# Patient Record
Sex: Female | Born: 1945
Health system: Southern US, Community
[De-identification: ages and names within clinical notes are randomized; demographics above are authoritative.]

## PROBLEM LIST (undated history)

## (undated) DIAGNOSIS — Z973 Presence of spectacles and contact lenses: Secondary | ICD-10-CM

## (undated) DIAGNOSIS — R112 Nausea with vomiting, unspecified: Secondary | ICD-10-CM

## (undated) DIAGNOSIS — Z8249 Family history of ischemic heart disease and other diseases of the circulatory system: Secondary | ICD-10-CM

## (undated) DIAGNOSIS — Z87442 Personal history of urinary calculi: Secondary | ICD-10-CM

## (undated) DIAGNOSIS — F419 Anxiety disorder, unspecified: Secondary | ICD-10-CM

## (undated) DIAGNOSIS — M47812 Spondylosis without myelopathy or radiculopathy, cervical region: Secondary | ICD-10-CM

## (undated) DIAGNOSIS — J189 Pneumonia, unspecified organism: Secondary | ICD-10-CM

## (undated) DIAGNOSIS — I4891 Unspecified atrial fibrillation: Secondary | ICD-10-CM

## (undated) DIAGNOSIS — Z9889 Other specified postprocedural states: Secondary | ICD-10-CM

## (undated) DIAGNOSIS — K219 Gastro-esophageal reflux disease without esophagitis: Secondary | ICD-10-CM

## (undated) DIAGNOSIS — I499 Cardiac arrhythmia, unspecified: Secondary | ICD-10-CM

## (undated) DIAGNOSIS — E785 Hyperlipidemia, unspecified: Secondary | ICD-10-CM

## (undated) DIAGNOSIS — I10 Essential (primary) hypertension: Secondary | ICD-10-CM

## (undated) DIAGNOSIS — T7840XA Allergy, unspecified, initial encounter: Secondary | ICD-10-CM

## (undated) DIAGNOSIS — T4145XA Adverse effect of unspecified anesthetic, initial encounter: Secondary | ICD-10-CM

## (undated) DIAGNOSIS — R6884 Jaw pain: Secondary | ICD-10-CM

## (undated) DIAGNOSIS — F329 Major depressive disorder, single episode, unspecified: Secondary | ICD-10-CM

## (undated) DIAGNOSIS — K635 Polyp of colon: Secondary | ICD-10-CM

## (undated) DIAGNOSIS — R739 Hyperglycemia, unspecified: Secondary | ICD-10-CM

## (undated) DIAGNOSIS — F32A Depression, unspecified: Secondary | ICD-10-CM

## (undated) HISTORY — DX: Unspecified atrial fibrillation: I48.91

## (undated) HISTORY — DX: Allergy, unspecified, initial encounter: T78.40XA

## (undated) HISTORY — PX: GANGLION CYST EXCISION: SHX1691

## (undated) HISTORY — DX: Depression, unspecified: F32.A

## (undated) HISTORY — DX: Hyperlipidemia, unspecified: E78.5

## (undated) HISTORY — DX: Gastro-esophageal reflux disease without esophagitis: K21.9

## (undated) HISTORY — DX: Anxiety disorder, unspecified: F41.9

## (undated) HISTORY — DX: Polyp of colon: K63.5

## (undated) HISTORY — DX: Hyperglycemia, unspecified: R73.9

## (undated) HISTORY — DX: Family history of ischemic heart disease and other diseases of the circulatory system: Z82.49

## (undated) HISTORY — DX: Spondylosis without myelopathy or radiculopathy, cervical region: M47.812

## (undated) HISTORY — DX: Major depressive disorder, single episode, unspecified: F32.9

## (undated) HISTORY — PX: EYE SURGERY: SHX253

---

## 1974-11-30 HISTORY — PX: LAPAROSCOPY: SHX197

## 1975-12-01 HISTORY — PX: TUBAL LIGATION: SHX77

## 1989-11-30 HISTORY — PX: ABDOMINAL HYSTERECTOMY: SHX81

## 2000-08-23 ENCOUNTER — Other Ambulatory Visit: Admission: RE | Admit: 2000-08-23 | Discharge: 2000-08-23 | Payer: Self-pay | Admitting: Gynecology

## 2003-08-13 ENCOUNTER — Other Ambulatory Visit: Admission: RE | Admit: 2003-08-13 | Discharge: 2003-08-13 | Payer: Self-pay | Admitting: Gynecology

## 2006-06-23 ENCOUNTER — Ambulatory Visit: Payer: Self-pay

## 2006-07-18 ENCOUNTER — Ambulatory Visit (HOSPITAL_COMMUNITY): Admission: RE | Admit: 2006-07-18 | Discharge: 2006-07-18 | Payer: Self-pay | Admitting: *Deleted

## 2009-11-30 DIAGNOSIS — T8859XA Other complications of anesthesia, initial encounter: Secondary | ICD-10-CM | POA: Insufficient documentation

## 2009-11-30 HISTORY — PX: COLONOSCOPY: SHX174

## 2009-11-30 HISTORY — DX: Other complications of anesthesia, initial encounter: T88.59XA

## 2010-05-02 ENCOUNTER — Encounter: Admission: RE | Admit: 2010-05-02 | Discharge: 2010-05-02 | Payer: Self-pay | Admitting: Emergency Medicine

## 2010-06-09 ENCOUNTER — Encounter: Admission: RE | Admit: 2010-06-09 | Discharge: 2010-06-09 | Payer: Self-pay | Admitting: Gastroenterology

## 2012-01-26 ENCOUNTER — Other Ambulatory Visit: Payer: Self-pay

## 2012-01-26 MED ORDER — PRAVASTATIN SODIUM 40 MG PO TABS: 40.0000 mg | ORAL_TABLET | Freq: Every day | ORAL | Status: DC

## 2012-03-29 ENCOUNTER — Other Ambulatory Visit: Payer: Self-pay | Admitting: Physician Assistant

## 2012-05-03 ENCOUNTER — Ambulatory Visit: Payer: Self-pay | Admitting: Physician Assistant

## 2012-05-17 ENCOUNTER — Encounter: Payer: Self-pay | Admitting: Physician Assistant

## 2012-05-17 ENCOUNTER — Ambulatory Visit: Payer: BC Managed Care – PPO | Admitting: Physician Assistant

## 2012-05-17 VITALS — BP 119/68 | HR 64 | Temp 98.4°F | Resp 16 | Ht 64.5 in | Wt 168.0 lb

## 2012-05-17 DIAGNOSIS — Z79890 Hormone replacement therapy (postmenopausal): Secondary | ICD-10-CM

## 2012-05-17 DIAGNOSIS — R7989 Other specified abnormal findings of blood chemistry: Secondary | ICD-10-CM

## 2012-05-17 DIAGNOSIS — J309 Allergic rhinitis, unspecified: Secondary | ICD-10-CM

## 2012-05-17 DIAGNOSIS — E782 Mixed hyperlipidemia: Secondary | ICD-10-CM

## 2012-05-17 LAB — COMPREHENSIVE METABOLIC PANEL
ALT: 17 U/L (ref 0–35)
AST: 18 U/L (ref 0–37)
Albumin: 4.1 g/dL (ref 3.5–5.2)
Alkaline Phosphatase: 87 U/L (ref 39–117)
BUN: 16 mg/dL (ref 6–23)
CO2: 25 mEq/L (ref 19–32)
Calcium: 9.3 mg/dL (ref 8.4–10.5)
Chloride: 105 mEq/L (ref 96–112)
Creat: 0.76 mg/dL (ref 0.50–1.10)
Glucose, Bld: 114 mg/dL — ABNORMAL HIGH (ref 70–99)
Potassium: 4.6 mEq/L (ref 3.5–5.3)
Sodium: 139 mEq/L (ref 135–145)
Total Bilirubin: 0.3 mg/dL (ref 0.3–1.2)
Total Protein: 6.6 g/dL (ref 6.0–8.3)

## 2012-05-17 LAB — LIPID PANEL
Cholesterol: 194 mg/dL (ref 0–200)
HDL: 48 mg/dL (ref >39)
LDL Cholesterol: 113 mg/dL — ABNORMAL HIGH (ref 0–99)
Total CHOL/HDL Ratio: 4 Ratio
Triglycerides: 164 mg/dL — ABNORMAL HIGH (ref <150)
VLDL: 33 mg/dL (ref 0–40)

## 2012-05-17 LAB — POCT GLYCOSYLATED HEMOGLOBIN (HGB A1C): Hemoglobin A1C: 5.8

## 2012-05-17 MED ORDER — FLUTICASONE PROPIONATE 50 MCG/ACT NA SUSP: 2.0000 | Freq: Every day | NASAL | Status: DC

## 2012-05-17 NOTE — Progress Notes (Addendum)
  Subjective:    Patient ID: Daisy Nelson, female    DOB: [birth year], 67 y.o.   MRN: [id number]  HPI  Presents for re-evaluation of hyperglycemia and hyperlipidemia.  Feels good.  Has had good control of allergies this spring and hasn't had an episode of sinusitis.  Review of Systems No chest pain, SOB, HA, dizziness, vision change, N/V, diarrhea, constipation, dysuria, urinary urgency or frequency, myalgias, arthralgias or rash.     Objective:   Physical Exam Vital signs noted. Well-developed, well nourished WF who is awake, alert and oriented, in NAD. HEENT: [place]/AT, PERRL, EOMI.  Sclera and conjunctiva are clear.  EAC are patent, TMs are normal in appearance. Nasal mucosa is pink and moist. OP is clear. Neck: supple, non-tender, no lymphadenopathy, thyromegaly. Heart: RRR, no murmur Lungs: CTA Extremities: no cyanosis, clubbing or edema. Skin: warm and dry without rash.  Results for orders placed in visit on 05/17/12  POCT GLYCOSYLATED HEMOGLOBIN (HGB A1C)      Component Value Range   Hemoglobin A1C 5.8           Assessment & Plan:   1. Mixed hyperlipidemia  Lipid Panel, Comprehensive metabolic panel  2. Other abnormal blood chemistry  HgB A1c, Comprehensive metabolic panel  3. AR (allergic rhinitis)  fluticasone (FLONASE) 50 MCG/ACT nasal spray  4. Postmenopausal HRT (hormone replacement therapy)    Schedule follow-up in 6 months and perform breast exam and pap at that time.  Patient Instructions  Keep up the great work!  Don't lose momentum this summer.  You may begin to wean yourself off the Premarin at your leisure, perhaps as the weather cools this fall.

## 2012-05-17 NOTE — Patient Instructions (Addendum)
Keep up the great work!  Don't lose momentum this summer.  You may begin to wean yourself off the Premarin at your leisure, perhaps as the weather cools this fall.

## 2012-05-18 ENCOUNTER — Encounter: Payer: Self-pay | Admitting: Physician Assistant

## 2012-05-19 ENCOUNTER — Telehealth: Payer: Self-pay

## 2012-05-19 NOTE — Telephone Encounter (Signed)
Pt's pharmacy called and stated that they had tried to get pt's rx refilled but that we had not responded. She needs a refill of pravastitian 40 #[phone] Pharmacy is [facility] PH# [phone]

## 2012-05-20 MED ORDER — PRAVASTATIN SODIUM 40 MG PO TABS: 40.0000 mg | ORAL_TABLET | Freq: Every day | ORAL | Status: DC

## 2012-05-20 NOTE — Telephone Encounter (Signed)
Done and sent in 

## 2012-05-20 NOTE — Telephone Encounter (Signed)
Can we do this ?

## 2012-05-21 ENCOUNTER — Other Ambulatory Visit: Payer: Self-pay

## 2012-05-21 NOTE — Telephone Encounter (Signed)
[facility] RX sent in. 

## 2012-05-30 ENCOUNTER — Other Ambulatory Visit: Payer: Self-pay | Admitting: Internal Medicine

## 2012-08-09 ENCOUNTER — Other Ambulatory Visit: Payer: Self-pay | Admitting: Physician Assistant

## 2012-09-12 ENCOUNTER — Other Ambulatory Visit: Payer: Self-pay | Admitting: Physician Assistant

## 2012-10-12 ENCOUNTER — Other Ambulatory Visit: Payer: Self-pay | Admitting: Physician Assistant

## 2012-10-15 ENCOUNTER — Telehealth: Payer: Self-pay

## 2012-10-15 MED ORDER — PRAVASTATIN SODIUM 40 MG PO TABS: 40.0000 mg | ORAL_TABLET | Freq: Every day | ORAL | Status: DC

## 2012-10-15 NOTE — Telephone Encounter (Signed)
Rx'ed refilled - keep appointment with Chelle.

## 2012-10-15 NOTE — Telephone Encounter (Signed)
PATIENT STATES WE REFILLED HER PROVASTATIN 40MG , HOWEVER, WE ONLY GAVE HER 15 PILLS. HER APPOINTMENT WITH CHELLE JEFFERY IS NOT UNTIL DEC. 17TH. SHE NEEDS TO GET ENOUGH TO LAST HER UNTIL THEN CALLED INTO HER PHARMACY. BEST PHONE [phone] (CELL)  PHARMACY CHOICE IS [facility].   MBC

## 2012-10-15 NOTE — Telephone Encounter (Signed)
[facility] Rx sent, keep appt with Chelle.

## 2012-10-15 NOTE — Telephone Encounter (Signed)
Can we do  

## 2012-10-25 ENCOUNTER — Ambulatory Visit: Payer: Self-pay | Admitting: Physician Assistant

## 2012-10-25 VITALS — BP 128/70 | HR 88 | Temp 98.2°F | Resp 16 | Ht 65.0 in | Wt 167.8 lb

## 2012-10-25 DIAGNOSIS — J309 Allergic rhinitis, unspecified: Secondary | ICD-10-CM | POA: Insufficient documentation

## 2012-10-25 DIAGNOSIS — J019 Acute sinusitis, unspecified: Secondary | ICD-10-CM

## 2012-10-25 DIAGNOSIS — J029 Acute pharyngitis, unspecified: Secondary | ICD-10-CM

## 2012-10-25 DIAGNOSIS — F419 Anxiety disorder, unspecified: Secondary | ICD-10-CM | POA: Insufficient documentation

## 2012-10-25 DIAGNOSIS — N200 Calculus of kidney: Secondary | ICD-10-CM | POA: Insufficient documentation

## 2012-10-25 DIAGNOSIS — J329 Chronic sinusitis, unspecified: Secondary | ICD-10-CM

## 2012-10-25 DIAGNOSIS — K219 Gastro-esophageal reflux disease without esophagitis: Secondary | ICD-10-CM | POA: Insufficient documentation

## 2012-10-25 DIAGNOSIS — E1169 Type 2 diabetes mellitus with other specified complication: Secondary | ICD-10-CM | POA: Insufficient documentation

## 2012-10-25 DIAGNOSIS — R05 Cough: Secondary | ICD-10-CM

## 2012-10-25 DIAGNOSIS — M47812 Spondylosis without myelopathy or radiculopathy, cervical region: Secondary | ICD-10-CM | POA: Insufficient documentation

## 2012-10-25 DIAGNOSIS — K635 Polyp of colon: Secondary | ICD-10-CM | POA: Insufficient documentation

## 2012-10-25 DIAGNOSIS — E785 Hyperlipidemia, unspecified: Secondary | ICD-10-CM | POA: Insufficient documentation

## 2012-10-25 DIAGNOSIS — R059 Cough, unspecified: Secondary | ICD-10-CM

## 2012-10-25 MED ORDER — GUAIFENESIN ER 1200 MG PO TB12: 1.0000 | ORAL_TABLET | Freq: Two times a day (BID) | ORAL | Status: DC | PRN

## 2012-10-25 MED ORDER — IPRATROPIUM BROMIDE 0.03 % NA SOLN: 2.0000 | Freq: Two times a day (BID) | NASAL | Status: DC

## 2012-10-25 MED ORDER — HYDROCOD POLST-CHLORPHEN POLST 10-8 MG/5ML PO LQCR: 5.0000 mL | Freq: Two times a day (BID) | ORAL | Status: DC | PRN

## 2012-10-25 MED ORDER — CLARITHROMYCIN ER 500 MG PO TB24: 1000.0000 mg | ORAL_TABLET | Freq: Every day | ORAL | Status: DC

## 2012-10-25 NOTE — Progress Notes (Signed)
Subjective:    Patient ID: CAMIA SCHAEDLER, female    DOB: Oct 02, 1946, 66 y.o.   MRN: [id number]  HPI This 66 y.o. female presents for evaluation of a respiratory illness.  She developed a raw sore throat after she "choked" on toothpaste 10/21/2012.  Since then, she has developed facial pressure and RIGHT maxillary sinus pain, nasal congestion and drainage, non-productive cough that keeps her from sleeping well, and laryngitis.  She has subjective fever and chills.  No nausea, vomiting or diarrhea.  No myalgias, but feels bad in general, similar to previous sinusitis she's had previously.  She was pleased to have NOT had an infection this past spring (she usually gets one twice annually) After we began a steroid nasal spray daily.  Past Medical History  Diagnosis Date  . GERD (gastroesophageal reflux disease)   . Allergy   . Hyperlipidemia   . Depression   . Anxiety   . Mitral valve prolapse   . DJD (degenerative joint disease) of cervical spine   . Nephrolithiasis   . Hyperglycemia   . Colon polyps     Past Surgical History  Procedure Date  . Tubal ligation 1977  . Cesarean section 1976  . Abdominal hysterectomy 1991  . Laparoscopy 1976  . Ganglion cyst excision     Left hand    Prior to Admission medications   Medication Sig Start Date End Date Taking? Authorizing Provider  aspirin 81 MG tablet Take 81 mg by mouth daily.   Yes Historical Provider, MD  estrogens, conjugated, (PREMARIN) 0.3 MG tablet Take 0.3 mg by mouth daily. Take daily for 21 days then do not take for 7 days.   Yes Historical Provider, MD  fish oil-omega-3 fatty acids 1000 MG capsule Take 2 g by mouth daily.   Yes Historical Provider, MD  fluticasone (FLONASE) 50 MCG/ACT nasal spray Place 2 sprays into the nose daily. 05/17/12 05/17/13 Yes Brenner Visconti S Lyrick Lagrand, PA-C  pravastatin (PRAVACHOL) 40 MG tablet Take 1 tablet (40 mg total) by mouth daily. Needs office visit and labs, third notice. 10/15/12  Yes Heather M  Marte, PA-C  ranitidine (ZANTAC) 150 MG tablet Take 150 mg by mouth 2 (two) times daily.   Yes Historical Provider, MD  sertraline (ZOLOFT) 50 MG tablet TAKE 1 TABLET BY MOUTH ONCE DAILY 05/30/12  Yes Sondra Barges, PA-C    Allergies  Allergen Reactions  . Adhesive (Tape)     Burn skin  . Erythromycin     Gi intolerance  . Naproxen     abd pain, n/v  . Penicillins     hives    History   Social History  . Marital Status: Married    Spouse Name: Molly Maduro    Number of Children: 1  . Years of Education: 12   Occupational History  . insurance agent    Social History Main Topics  . Smoking status: Former Games developer  . Smokeless tobacco: Not on file  . Alcohol Use: No  . Drug Use: No  . Sexually Active: Yes   Other Topics Concern  . Not on file   Social History Narrative   Lives with her husband and their cat, Tedra [place].    Family History  Problem Relation Age of Onset  . Stroke Mother   . Diabetes Father   . Heart disease Father   . Arthritis Father     c6-7  . Cancer Father     skin cancer (SCC)  Review of Systems As above.    Objective:   Physical Exam  Blood pressure 128/70, pulse 88, temperature 98.2 F (36.8 C), temperature source Oral, resp. rate 16, height 5\' 5"  (1.651 m), weight 167 lb 12.8 oz (76.114 kg), SpO2 96.00%. Body mass index is 27.92 kg/(m^2). Well-developed, well nourished WF who is awake, alert and oriented, in NAD. HEENT: [place]/AT, PERRL, EOMI.  Sclera and conjunctiva are clear.  EAC are patent, TMs are normal in appearance. Nasal mucosa is pink and moist. OP is clear. No frontal or maxillary sinus tenderness. Neck: supple, non-tender, no lymphadenopathy, thyromegaly. Heart: RRR, no murmur Lungs: normal effort, CTA Extremities: no cyanosis, clubbing or edema. Skin: warm and dry without rash. Psychologic: good mood and appropriate affect, normal speech and behavior.     Assessment & Plan:   1. Sinusitis  clarithromycin (BIAXIN XL) 500 MG 24  hr tablet, ipratropium (ATROVENT) 0.03 % nasal spray, Guaifenesin (MUCINEX MAXIMUM STRENGTH) 1200 MG TB12  2. Cough  chlorpheniramine-HYDROcodone (TUSSIONEX PENNKINETIC ER) 10-8 MG/5ML LQCR  3. Pharyngitis     Supportive care.  Anticipatory guidance.  RTC PRN.

## 2012-10-25 NOTE — Patient Instructions (Signed)
Get plenty of rest and drink at least 64 ounces of water daily. 

## 2012-11-15 ENCOUNTER — Encounter: Payer: Self-pay | Admitting: Physician Assistant

## 2012-11-15 ENCOUNTER — Ambulatory Visit: Payer: Self-pay | Admitting: Physician Assistant

## 2012-11-15 VITALS — BP 115/64 | HR 64 | Temp 97.1°F | Resp 16 | Ht 65.0 in | Wt 166.0 lb

## 2012-11-15 DIAGNOSIS — Z79890 Hormone replacement therapy (postmenopausal): Secondary | ICD-10-CM

## 2012-11-15 DIAGNOSIS — F329 Major depressive disorder, single episode, unspecified: Secondary | ICD-10-CM

## 2012-11-15 DIAGNOSIS — Z13820 Encounter for screening for osteoporosis: Secondary | ICD-10-CM

## 2012-11-15 DIAGNOSIS — Z1211 Encounter for screening for malignant neoplasm of colon: Secondary | ICD-10-CM

## 2012-11-15 DIAGNOSIS — F32A Depression, unspecified: Secondary | ICD-10-CM

## 2012-11-15 DIAGNOSIS — R739 Hyperglycemia, unspecified: Secondary | ICD-10-CM

## 2012-11-15 DIAGNOSIS — E785 Hyperlipidemia, unspecified: Secondary | ICD-10-CM

## 2012-11-15 DIAGNOSIS — F419 Anxiety disorder, unspecified: Secondary | ICD-10-CM

## 2012-11-15 DIAGNOSIS — Z0000 Encounter for general adult medical examination without abnormal findings: Secondary | ICD-10-CM

## 2012-11-15 DIAGNOSIS — Z1239 Encounter for other screening for malignant neoplasm of breast: Secondary | ICD-10-CM

## 2012-11-15 DIAGNOSIS — Z23 Encounter for immunization: Secondary | ICD-10-CM

## 2012-11-15 LAB — POCT URINALYSIS DIPSTICK
Bilirubin, UA: NEGATIVE
Blood, UA: NEGATIVE
Glucose, UA: NEGATIVE
Ketones, UA: NEGATIVE
Leukocytes, UA: NEGATIVE
Nitrite, UA: NEGATIVE
Protein, UA: NEGATIVE
Spec Grav, UA: 1.025
Urobilinogen, UA: 0.2
pH, UA: 5.5

## 2012-11-15 LAB — POCT GLYCOSYLATED HEMOGLOBIN (HGB A1C): Hemoglobin A1C: 6.2

## 2012-11-15 LAB — CBC WITH DIFFERENTIAL/PLATELET
Basophils Absolute: 0 10*3/uL (ref 0.0–0.1)
Basophils Relative: 0 % (ref 0–1)
Eosinophils Absolute: 0.1 10*3/uL (ref 0.0–0.7)
Eosinophils Relative: 2 % (ref 0–5)
HCT: 40.8 % (ref 36.0–46.0)
Hemoglobin: 14.3 g/dL (ref 12.0–15.0)
Lymphocytes Relative: 22 % (ref 12–46)
Lymphs Abs: 1.1 10*3/uL (ref 0.7–4.0)
MCH: 30 pg (ref 26.0–34.0)
MCHC: 35 g/dL (ref 30.0–36.0)
MCV: 85.7 fL (ref 78.0–100.0)
Monocytes Absolute: 0.6 10*3/uL (ref 0.1–1.0)
Monocytes Relative: 13 % — ABNORMAL HIGH (ref 3–12)
Neutro Abs: 3.2 10*3/uL (ref 1.7–7.7)
Neutrophils Relative %: 63 % (ref 43–77)
Platelets: 220 10*3/uL (ref 150–400)
RBC: 4.76 MIL/uL (ref 3.87–5.11)
RDW: 14.1 % (ref 11.5–15.5)
WBC: 5 10*3/uL (ref 4.0–10.5)

## 2012-11-15 LAB — POCT UA - MICROSCOPIC ONLY
Bacteria, U Microscopic: NEGATIVE
Casts, Ur, LPF, POC: NEGATIVE
Crystals, Ur, HPF, POC: NEGATIVE
RBC, urine, microscopic: NEGATIVE
WBC, Ur, HPF, POC: NEGATIVE
Yeast, UA: NEGATIVE

## 2012-11-15 LAB — LIPID PANEL
Cholesterol: 174 mg/dL (ref 0–200)
HDL: 56 mg/dL (ref >39)
LDL Cholesterol: 87 mg/dL (ref 0–99)
Total CHOL/HDL Ratio: 3.1 Ratio
Triglycerides: 155 mg/dL — ABNORMAL HIGH (ref <150)
VLDL: 31 mg/dL (ref 0–40)

## 2012-11-15 LAB — COMPREHENSIVE METABOLIC PANEL
ALT: 20 U/L (ref 0–35)
AST: 17 U/L (ref 0–37)
Albumin: 4.2 g/dL (ref 3.5–5.2)
Alkaline Phosphatase: 96 U/L (ref 39–117)
BUN: 15 mg/dL (ref 6–23)
CO2: 26 mEq/L (ref 19–32)
Calcium: 9.4 mg/dL (ref 8.4–10.5)
Chloride: 105 mEq/L (ref 96–112)
Creat: 0.81 mg/dL (ref 0.50–1.10)
Glucose, Bld: 108 mg/dL — ABNORMAL HIGH (ref 70–99)
Potassium: 4.3 mEq/L (ref 3.5–5.3)
Sodium: 139 mEq/L (ref 135–145)
Total Bilirubin: 0.4 mg/dL (ref 0.3–1.2)
Total Protein: 6.8 g/dL (ref 6.0–8.3)

## 2012-11-15 LAB — GLUCOSE, POCT (MANUAL RESULT ENTRY): POC Glucose: 110 mg/dl — AB (ref 70–99)

## 2012-11-15 LAB — IFOBT (OCCULT BLOOD): IFOBT: NEGATIVE

## 2012-11-15 MED ORDER — PRAVASTATIN SODIUM 40 MG PO TABS: 40.0000 mg | ORAL_TABLET | Freq: Every day | ORAL | Status: DC

## 2012-11-15 MED ORDER — ESTROGENS CONJUGATED 0.3 MG PO TABS: 0.3000 mg | ORAL_TABLET | Freq: Every day | ORAL | Status: DC

## 2012-11-15 MED ORDER — SERTRALINE HCL 25 MG PO TABS: 25.0000 mg | ORAL_TABLET | Freq: Every day | ORAL | Status: DC

## 2012-11-15 NOTE — Patient Instructions (Addendum)

## 2012-11-15 NOTE — Progress Notes (Signed)
Subjective:    Patient ID: Daisy Nelson, female    DOB: February 23, 1946, 66 y.o.   MRN: [id number]  HPI This 66 y.o. female presents for Annual Wellness Examination.  Reports she's doing well.  Lost her job this summer, but was able to find another, PT.  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  She's working on weaning herself from sertraline and premarin.  She's recovering from a recent respiratory illness and notes the development of urine leakage with cough, sneezing, etc.  Review of Systems  HENT: Positive for sneezing, postnasal drip and tinnitus.   Genitourinary:       Stress Incontinence; HRT  Psychiatric/Behavioral: The patient is nervous/anxious.   All other systems reviewed and are negative.      Objective:   Physical Exam  Vitals reviewed. Constitutional: She is oriented to person, place, and time. Vital signs are normal. She appears well-developed and well-nourished. She is active and cooperative. No distress.  HENT:  Head: Normocephalic and atraumatic.  Right Ear: Hearing, tympanic membrane, external ear and ear canal normal. No foreign bodies.  Left Ear: Hearing, tympanic membrane, external ear and ear canal normal. No foreign bodies.  Nose: Nose normal.  Mouth/Throat: Uvula is midline, oropharynx is clear and moist and mucous membranes are normal. No oral lesions. Normal dentition. No dental abscesses or uvula swelling. No oropharyngeal exudate.  Eyes: Conjunctivae normal, EOM and lids are normal. Pupils are equal, round, and reactive to light. Right eye exhibits no discharge. Left eye exhibits no discharge. No scleral icterus.  Fundoscopic exam:      The right eye shows no arteriolar narrowing, no AV nicking, no exudate, no hemorrhage and no papilledema. The right eye shows red reflex.The right eye shows no venous pulsations.      The left eye shows no arteriolar  narrowing, no AV nicking, no exudate, no hemorrhage and no papilledema. The left eye shows red reflex.The left eye shows no venous pulsations. Neck: Trachea normal, normal range of motion and full passive range of motion without pain. Neck supple. No spinous process tenderness and no muscular tenderness present. No mass and no thyromegaly present.  Cardiovascular: Normal rate, regular rhythm, normal heart sounds, intact distal pulses and normal pulses.   Pulmonary/Chest: Effort normal and breath sounds normal. She exhibits no tenderness and no retraction. Right breast exhibits no inverted nipple, no mass, no nipple discharge, no skin change and no tenderness. Left breast exhibits no inverted nipple, no mass, no nipple discharge, no skin change and no tenderness. Breasts are symmetrical.  Abdominal: Soft. Normal appearance and bowel sounds are normal. She exhibits no distension and no mass. There is no hepatosplenomegaly. There is no tenderness. There is no rigidity, no rebound, no guarding, no CVA tenderness, no tenderness at McBurney's point and negative Murphy's sign. No hernia. Hernia confirmed negative in the right inguinal area and confirmed negative in the left inguinal area.  Genitourinary: Rectum normal and uterus normal. Rectal exam shows no external hemorrhoid and no fissure. No breast swelling, tenderness, discharge or bleeding. Pelvic exam was performed with patient supine. No labial fusion. There is no rash, tenderness, lesion or injury on the right labia. There is no rash, tenderness, lesion or injury on the left labia. Cervix exhibits no motion tenderness. Right adnexum displays no mass, no tenderness and no fullness. Left adnexum displays no mass, no tenderness and no fullness.  No foreign body around the vagina. No vaginal discharge found.  Musculoskeletal: She exhibits no edema and no tenderness.       Cervical back: Normal.       Thoracic back: Normal.       Lumbar back: Normal.   Lymphadenopathy:       Head (right side): No tonsillar, no preauricular, no posterior auricular and no occipital adenopathy present.       Head (left side): No tonsillar, no preauricular, no posterior auricular and no occipital adenopathy present.    She has no cervical adenopathy.    She has no axillary adenopathy.       Right: No inguinal and no supraclavicular adenopathy present.       Left: No inguinal and no supraclavicular adenopathy present.  Neurological: She is alert and oriented to person, place, and time. She has normal strength and normal reflexes. No cranial nerve deficit. She exhibits normal muscle tone. Coordination and gait normal.  Skin: Skin is warm, dry and intact. No rash noted. She is not diaphoretic. No cyanosis or erythema. Nails show no clubbing.  Psychiatric: She has a normal mood and affect. Her speech is normal and behavior is normal. Judgment and thought content normal.   Results for orders placed in visit on 11/15/12  GLUCOSE, POCT (MANUAL RESULT ENTRY)      Component Value Range   POC Glucose 110 (*) 70 - 99 mg/dl  POCT GLYCOSYLATED HEMOGLOBIN (HGB A1C)      Component Value Range   Hemoglobin A1C 6.2    POCT UA - MICROSCOPIC ONLY      Component Value Range   WBC, Ur, HPF, POC neg     RBC, urine, microscopic neg     Bacteria, U Microscopic neg     Mucus, UA trace     Epithelial cells, urine per micros 0-6     Crystals, Ur, HPF, POC neg     Casts, Ur, LPF, POC neg     Yeast, UA neg    POCT URINALYSIS DIPSTICK      Component Value Range   Color, UA yellow     Clarity, UA clear     Glucose, UA neg     Bilirubin, UA neg     Ketones, UA neg     Spec Grav, UA 1.025     Blood, UA neg     pH, UA 5.5     Protein, UA neg     Urobilinogen, UA 0.2     Nitrite, UA neg     Leukocytes, UA Negative    IFOBT (OCCULT BLOOD)      Component Value Range   IFOBT Negative        Assessment & Plan:   1. Routine general medical examination at a health care  facility  CBC with Differential, POCT UA - Microscopic Only, POCT urinalysis dipstick  2. Need for prophylactic vaccination and inoculation against influenza  Flu vaccine greater than or equal to 3yo preservative free IM  3. Hyperlipidemia -controlled pravastatin (PRAVACHOL) 40 MG tablet, Comprehensive metabolic panel, Lipid panel  4. Hyperglycemia -controlled with lifestyle POCT glucose (manual entry), POCT glycosylated hemoglobin (Hb A1C)  5. Postmenopausal HRT (hormone replacement therapy) -working on tapering off estrogens, conjugated, (PREMARIN) 0.3 MG tablet  6. Depression -controlled with treatment sertraline (ZOLOFT) 25 MG tablet  7. Anxiety -controlled with treatment sertraline (ZOLOFT) 25 MG tablet  8. Screening for breast cancer  MM Digital Screening  9. Screening for  osteoporosis  DG Bone Density  10. Screening for colon cancer  IFOBT POC (occult bld, rslt in office)

## 2013-01-28 HISTORY — PX: TRANSTHORACIC ECHOCARDIOGRAM: SHX275

## 2013-01-28 HISTORY — PX: NM MYOCAR PERF WALL MOTION: HXRAD629

## 2013-02-06 ENCOUNTER — Inpatient Hospital Stay (HOSPITAL_COMMUNITY)
Admission: EM | Admit: 2013-02-06 | Discharge: 2013-02-07 | DRG: 303 | Disposition: A | Discharge status: Home / Self Care | Payer: MEDICAID | Attending: Cardiovascular Disease | Admitting: Cardiovascular Disease

## 2013-02-06 ENCOUNTER — Encounter (HOSPITAL_COMMUNITY): Payer: Self-pay | Admitting: Emergency Medicine

## 2013-02-06 ENCOUNTER — Emergency Department (HOSPITAL_COMMUNITY): Payer: Self-pay

## 2013-02-06 DIAGNOSIS — Z860100 Personal history of colon polyps, unspecified: Secondary | ICD-10-CM

## 2013-02-06 DIAGNOSIS — E785 Hyperlipidemia, unspecified: Secondary | ICD-10-CM | POA: Diagnosis present

## 2013-02-06 DIAGNOSIS — Z79899 Other long term (current) drug therapy: Secondary | ICD-10-CM

## 2013-02-06 DIAGNOSIS — R9431 Abnormal electrocardiogram [ECG] [EKG]: Secondary | ICD-10-CM | POA: Diagnosis present

## 2013-02-06 DIAGNOSIS — F329 Major depressive disorder, single episode, unspecified: Secondary | ICD-10-CM | POA: Diagnosis present

## 2013-02-06 DIAGNOSIS — Z87891 Personal history of nicotine dependence: Secondary | ICD-10-CM

## 2013-02-06 DIAGNOSIS — Z79890 Hormone replacement therapy (postmenopausal): Secondary | ICD-10-CM

## 2013-02-06 DIAGNOSIS — F3289 Other specified depressive episodes: Secondary | ICD-10-CM | POA: Diagnosis present

## 2013-02-06 DIAGNOSIS — M47812 Spondylosis without myelopathy or radiculopathy, cervical region: Secondary | ICD-10-CM | POA: Diagnosis present

## 2013-02-06 DIAGNOSIS — E1169 Type 2 diabetes mellitus with other specified complication: Secondary | ICD-10-CM | POA: Diagnosis present

## 2013-02-06 DIAGNOSIS — I2510 Atherosclerotic heart disease of native coronary artery without angina pectoris: Principal | ICD-10-CM | POA: Diagnosis present

## 2013-02-06 DIAGNOSIS — Z880 Allergy status to penicillin: Secondary | ICD-10-CM

## 2013-02-06 DIAGNOSIS — F411 Generalized anxiety disorder: Secondary | ICD-10-CM | POA: Diagnosis present

## 2013-02-06 DIAGNOSIS — Z881 Allergy status to other antibiotic agents status: Secondary | ICD-10-CM

## 2013-02-06 DIAGNOSIS — Z780 Asymptomatic menopausal state: Secondary | ICD-10-CM

## 2013-02-06 DIAGNOSIS — N200 Calculus of kidney: Secondary | ICD-10-CM | POA: Diagnosis present

## 2013-02-06 DIAGNOSIS — I059 Rheumatic mitral valve disease, unspecified: Secondary | ICD-10-CM | POA: Diagnosis present

## 2013-02-06 DIAGNOSIS — I200 Unstable angina: Secondary | ICD-10-CM | POA: Diagnosis present

## 2013-02-06 DIAGNOSIS — Z8249 Family history of ischemic heart disease and other diseases of the circulatory system: Secondary | ICD-10-CM

## 2013-02-06 DIAGNOSIS — Z90710 Acquired absence of both cervix and uterus: Secondary | ICD-10-CM

## 2013-02-06 DIAGNOSIS — Z888 Allergy status to other drugs, medicaments and biological substances status: Secondary | ICD-10-CM

## 2013-02-06 DIAGNOSIS — I4891 Unspecified atrial fibrillation: Secondary | ICD-10-CM | POA: Diagnosis present

## 2013-02-06 DIAGNOSIS — K219 Gastro-esophageal reflux disease without esophagitis: Secondary | ICD-10-CM | POA: Diagnosis present

## 2013-02-06 LAB — PROTIME-INR
INR: 1.08 (ref 0.00–1.49)
Prothrombin Time: 13.9 seconds (ref 11.6–15.2)

## 2013-02-06 LAB — BASIC METABOLIC PANEL
BUN: 15 mg/dL (ref 6–23)
CO2: 21 mEq/L (ref 19–32)
Calcium: 9.6 mg/dL (ref 8.4–10.5)
Chloride: 105 mEq/L (ref 96–112)
Creatinine, Ser: 0.69 mg/dL (ref 0.50–1.10)
GFR calc Af Amer: >90 mL/min (ref >90)
GFR calc non Af Amer: >90 mL/min (ref >90)
Glucose, Bld: 158 mg/dL — ABNORMAL HIGH (ref 70–99)
Potassium: 3.9 mEq/L (ref 3.5–5.1)
Sodium: 139 mEq/L (ref 135–145)

## 2013-02-06 LAB — CBC
HCT: 43.4 % (ref 36.0–46.0)
Hemoglobin: 15.1 g/dL — ABNORMAL HIGH (ref 12.0–15.0)
MCH: 30.4 pg (ref 26.0–34.0)
MCHC: 34.8 g/dL (ref 30.0–36.0)
MCV: 87.5 fL (ref 78.0–100.0)
Platelets: 205 10*3/uL (ref 150–400)
RBC: 4.96 MIL/uL (ref 3.87–5.11)
RDW: 13.6 % (ref 11.5–15.5)
WBC: 6.1 10*3/uL (ref 4.0–10.5)

## 2013-02-06 LAB — TROPONIN I
Troponin I: <0.3 ng/mL (ref <0.30)
Troponin I: <0.3 ng/mL (ref <0.30)

## 2013-02-06 LAB — POCT I-STAT TROPONIN I: Troponin i, poc: 0 ng/mL (ref 0.00–0.08)

## 2013-02-06 LAB — MRSA PCR SCREENING: MRSA by PCR: NEGATIVE

## 2013-02-06 LAB — PRO B NATRIURETIC PEPTIDE: Pro B Natriuretic peptide (BNP): 126.6 pg/mL — ABNORMAL HIGH (ref 0–125)

## 2013-02-06 LAB — HEPARIN LEVEL (UNFRACTIONATED)
Heparin Unfractionated: 0.71 IU/mL — ABNORMAL HIGH (ref 0.30–0.70)
Heparin Unfractionated: 0.33 IU/mL (ref 0.30–0.70)

## 2013-02-06 LAB — MAGNESIUM: Magnesium: 1.9 mg/dL (ref 1.5–2.5)

## 2013-02-06 MED ORDER — OMEGA-3-ACID ETHYL ESTERS 1 G PO CAPS
1.0000 g | ORAL_CAPSULE | Freq: Two times a day (BID) | ORAL | Status: DC
  Administered 2013-02-06 – 2013-02-07 (×2): 1 g via ORAL
  Filled 2013-02-06 (×3): qty 1

## 2013-02-06 MED ORDER — PANTOPRAZOLE SODIUM 40 MG PO TBEC: 40.0000 mg | DELAYED_RELEASE_TABLET | Freq: Every day | ORAL | Status: DC

## 2013-02-06 MED ORDER — HEPARIN (PORCINE) IN NACL 100-0.45 UNIT/ML-% IJ SOLN
900.0000 [IU]/h | INTRAMUSCULAR | Status: DC
  Administered 2013-02-06: 1000 [IU]/h via INTRAVENOUS
  Administered 2013-02-07: 850 [IU]/h via INTRAVENOUS
  Filled 2013-02-06 (×3): qty 250

## 2013-02-06 MED ORDER — SODIUM CHLORIDE 0.9 % IV SOLN
INTRAVENOUS | Status: DC
  Administered 2013-02-06: 75 mL/h via INTRAVENOUS

## 2013-02-06 MED ORDER — METOPROLOL TARTRATE 25 MG PO TABS
25.0000 mg | ORAL_TABLET | Freq: Two times a day (BID) | ORAL | Status: DC
  Filled 2013-02-06 (×3): qty 1

## 2013-02-06 MED ORDER — OMEGA-3 FATTY ACIDS 1000 MG PO CAPS: 1.0000 g | ORAL_CAPSULE | Freq: Three times a day (TID) | ORAL | Status: DC

## 2013-02-06 MED ORDER — ACETAMINOPHEN 325 MG PO TABS
650.0000 mg | ORAL_TABLET | ORAL | Status: DC | PRN
  Administered 2013-02-06 (×2): 650 mg via ORAL
  Filled 2013-02-06 (×2): qty 2

## 2013-02-06 MED ORDER — ASPIRIN EC 81 MG PO TBEC
81.0000 mg | DELAYED_RELEASE_TABLET | Freq: Every day | ORAL | Status: DC
  Administered 2013-02-06 – 2013-02-07 (×2): 81 mg via ORAL
  Filled 2013-02-06 (×2): qty 1

## 2013-02-06 MED ORDER — DEXTROSE 5 % IV SOLN
5.0000 mg/h | INTRAVENOUS | Status: DC
  Administered 2013-02-06: 5 mg/h via INTRAVENOUS

## 2013-02-06 MED ORDER — DILTIAZEM HCL 30 MG PO TABS
30.0000 mg | ORAL_TABLET | Freq: Once | ORAL | Status: AC
  Administered 2013-02-06: 30 mg via ORAL
  Filled 2013-02-06: qty 1

## 2013-02-06 MED ORDER — DILTIAZEM HCL 25 MG/5ML IV SOLN
10.0000 mg | Freq: Once | INTRAVENOUS | Status: AC
  Administered 2013-02-06: 10 mg via INTRAVENOUS
  Filled 2013-02-06: qty 5

## 2013-02-06 MED ORDER — SODIUM CHLORIDE 0.9 % IJ SOLN
3.0000 mL | Freq: Two times a day (BID) | INTRAMUSCULAR | Status: DC
  Administered 2013-02-06 – 2013-02-07 (×2): 3 mL via INTRAVENOUS

## 2013-02-06 MED ORDER — FLUTICASONE PROPIONATE 50 MCG/ACT NA SUSP
2.0000 | Freq: Every day | NASAL | Status: DC
  Administered 2013-02-07: 2 via NASAL
  Filled 2013-02-06: qty 16

## 2013-02-06 MED ORDER — NITROGLYCERIN 0.4 MG SL SUBL: 0.4000 mg | SUBLINGUAL_TABLET | SUBLINGUAL | Status: DC | PRN

## 2013-02-06 MED ORDER — SODIUM CHLORIDE 0.9 % IJ SOLN: 3.0000 mL | INTRAMUSCULAR | Status: DC | PRN

## 2013-02-06 MED ORDER — SIMVASTATIN 10 MG PO TABS
10.0000 mg | ORAL_TABLET | Freq: Every day | ORAL | Status: DC
  Administered 2013-02-06: 10 mg via ORAL
  Filled 2013-02-06 (×2): qty 1

## 2013-02-06 MED ORDER — ZOLPIDEM TARTRATE 5 MG PO TABS: 5.0000 mg | ORAL_TABLET | Freq: Every evening | ORAL | Status: DC | PRN

## 2013-02-06 MED ORDER — SODIUM CHLORIDE 0.9 % IV SOLN: 250.0000 mL | INTRAVENOUS | Status: DC | PRN

## 2013-02-06 MED ORDER — ESTROGENS CONJUGATED 0.3 MG PO TABS
0.3000 mg | ORAL_TABLET | Freq: Every day | ORAL | Status: DC
  Administered 2013-02-06 – 2013-02-07 (×2): 0.3 mg via ORAL
  Filled 2013-02-06 (×2): qty 1

## 2013-02-06 MED ORDER — ONDANSETRON HCL 4 MG/2ML IJ SOLN: 4.0000 mg | Freq: Four times a day (QID) | INTRAMUSCULAR | Status: DC | PRN

## 2013-02-06 MED ORDER — SERTRALINE HCL 25 MG PO TABS
25.0000 mg | ORAL_TABLET | Freq: Every day | ORAL | Status: DC
  Administered 2013-02-06 – 2013-02-07 (×2): 25 mg via ORAL
  Filled 2013-02-06 (×2): qty 1

## 2013-02-06 MED ORDER — ALPRAZOLAM 0.25 MG PO TABS: 0.2500 mg | ORAL_TABLET | Freq: Two times a day (BID) | ORAL | Status: DC | PRN

## 2013-02-06 MED ORDER — ASPIRIN 81 MG PO CHEW
324.0000 mg | CHEWABLE_TABLET | Freq: Once | ORAL | Status: AC
  Administered 2013-02-06: 324 mg via ORAL
  Filled 2013-02-06: qty 4

## 2013-02-06 MED ORDER — NITROGLYCERIN 2 % TD OINT
0.5000 [in_us] | TOPICAL_OINTMENT | Freq: Four times a day (QID) | TRANSDERMAL | Status: DC
  Administered 2013-02-06: 0.5 [in_us] via TOPICAL
  Filled 2013-02-06: qty 1

## 2013-02-06 MED ORDER — HEPARIN BOLUS VIA INFUSION
4000.0000 [IU] | Freq: Once | INTRAVENOUS | Status: AC
  Administered 2013-02-06: 4000 [IU] via INTRAVENOUS

## 2013-02-06 NOTE — Progress Notes (Signed)
ANTICOAGULATION CONSULT NOTE - Initial Consult  Pharmacy Consult for heparin Indication: atrial fibrillation  Allergies  Allergen Reactions  . Adhesive (Tape)     Burn skin  . Erythromycin     Gi intolerance  . Naproxen     abd pain, n/v  . Penicillins     hives    Patient Measurements:   Heparin Dosing Weight: 72 kg  Vital Signs: Temp: 97.4 F (36.3 C) (03/10 0311) Temp src: Oral (03/10 0311) BP: 97/42 mmHg (03/10 0700) Pulse Rate: 72 (03/10 0630)  Labs:  Recent Labs  02/06/13 0337  HGB 15.1*  HCT 43.4  PLT 205  CREATININE 0.69    The CrCl is unknown because both a height and weight (above a minimum accepted value) are required for this calculation.   Medical History: Past Medical History  Diagnosis Date  . GERD (gastroesophageal reflux disease)   . Allergy   . Hyperlipidemia   . Depression   . Anxiety   . Mitral valve prolapse   . DJD (degenerative joint disease) of cervical spine   . Nephrolithiasis   . Hyperglycemia   . Colon polyps     Medications:  Scheduled:  . [COMPLETED] aspirin  324 mg Oral Once  . nitroGLYCERIN  0.5 inch Topical Q6H    Assessment: 67 yo female presented with unstable angina and atrial fibrillation with RVR. Pharmacy consulted to manage IV heparin.   Goal of Therapy:  Heparin level 0.3-0.7 units/ml Monitor platelets by anticoagulation protocol: Yes   Plan:  1. Heparin 4000 units x 1, then IV infusion of 1000 units/hr.  2. Heparin level in 6 hours 3. Daily CBC, heparin level  Emeline Gins 02/06/2013,7:31 AM

## 2013-02-06 NOTE — H&P (Signed)
Patient ID: Daisy Nelson MRN: [id number], DOB/AGE: 65-Dec-1947   Admit date: 02/06/2013   Primary Physician: Olene Floss Primary Cardiologist: Dr Allyson Sabal (new)  HPI:    67 y/o female with no prior history of CAD or AF, presented to the ER 3/10 around 3am with complaints of jaw pain, palpitations, and SOB. Her symptoms woke her from sleep around 12:30am. In the ER she was noted to be in Rapid AF. Her EKG shows diffuse ST depression. Initial Troponin is negative. Her symptoms are improved after Diltiazem IV was started. She denies any prior history of similar symptoms in the past. She did take Mucinex D earlier this week for sinus infection.   Problem List: Past Medical History  Diagnosis Date  . GERD (gastroesophageal reflux disease)   . Allergy   . Hyperlipidemia   . Depression   . Anxiety   . Mitral valve prolapse   . DJD (degenerative joint disease) of cervical spine   . Nephrolithiasis   . Hyperglycemia   . Colon polyps     Past Surgical History  Procedure Laterality Date  . Tubal ligation  1977  . Cesarean section  1976  . Abdominal hysterectomy  1991  . Laparoscopy  1976  . Ganglion cyst excision      Left hand     Allergies:  Allergies  Allergen Reactions  . Adhesive (Tape)     Burn skin  . Erythromycin     Gi intolerance  . Naproxen     abd pain, n/v  . Penicillins     hives     Home Medications See Med Rec   Family History  Problem Relation Age of Onset  . Stroke Mother   . Diabetes Father   . Heart disease Father   . Arthritis Father     c6-7  . Cancer Father     skin cancer (SCC)     History   Social History  . Marital Status: Married    Spouse Name: Molly Maduro    Number of Children: 1  . Years of Education: 12   Occupational History  . insurance agent    Social History Main Topics  . Smoking status: Former Smoker -- 0.50 packs/day for 36 years    Types: Cigarettes    Start date: 03/15/1963    Quit date: 03/15/1999  .  Smokeless tobacco: Never Used  . Alcohol Use: No  . Drug Use: No  . Sexually Active: Yes -- Female partner(s)    Birth Control/ Protection: Surgical   Other Topics Concern  . Not on file   Social History Narrative   Lives with her husband and their cat, Tedra [place].     Review of Systems: General: negative for chills, fever, night sweats or weight changes.  Cardiovascular: negative for chest pain, dyspnea on exertion, edema, orthopnea, palpitations, paroxysmal nocturnal dyspnea or shortness of breath. She had a remote "stress test" at [facility]. Dermatological: negative for rash Respiratory: negative for cough or wheezing Urologic: negative for hematuria, nephrolithiasis Abdominal: negative for nausea, vomiting, diarrhea, bright red blood per rectum, melena, or hematemesis Neurologic: negative for visual changes, syncope, or dizziness All other systems reviewed and are otherwise negative except as noted above.  Physical Exam: Blood pressure 116/63, pulse 111, temperature 97.4 F (36.3 C), temperature source Oral, resp. rate 13, SpO2 97.00%.  General appearance: alert, cooperative, appears stated age, no distress and moderately obese Neck: no carotid bruit, no JVD, supple, symmetrical, trachea midline and thyroid  not enlarged, symmetric, no tenderness/mass/nodules Lungs: clear to auscultation bilaterally Heart: irregularly irregular rhythm Abdomen: soft, non-tender; bowel sounds normal; no masses,  no organomegaly Extremities: extremities normal, atraumatic, no cyanosis or edema Pulses: 2+ and symmetric Skin: Skin color, texture, turgor normal. No rashes or lesions Neurologic: Grossly normal    Labs:   Results for orders placed during the hospital encounter of 02/06/13 (from the past 24 hour(s))  CBC     Status: Abnormal   Collection Time    02/06/13  3:37 AM      Result Value Range   WBC 6.1  4.0 - 10.5 K/uL   RBC 4.96  3.87 - 5.11 MIL/uL   Hemoglobin 15.1 (*) 12.0  - 15.0 g/dL   HCT 40.9  81.1 - 91.4 %   MCV 87.5  78.0 - 100.0 fL   MCH 30.4  26.0 - 34.0 pg   MCHC 34.8  30.0 - 36.0 g/dL   RDW 78.2  95.6 - 21.3 %   Platelets 205  150 - 400 K/uL  BASIC METABOLIC PANEL     Status: Abnormal   Collection Time    02/06/13  3:37 AM      Result Value Range   Sodium 139  135 - 145 mEq/L   Potassium 3.9  3.5 - 5.1 mEq/L   Chloride 105  96 - 112 mEq/L   CO2 21  19 - 32 mEq/L   Glucose, Bld 158 (*) 70 - 99 mg/dL   BUN 15  6 - 23 mg/dL   Creatinine, Ser 0.86  0.50 - 1.10 mg/dL   Calcium 9.6  8.4 - 57.8 mg/dL   GFR calc non Af Amer >90  >90 mL/min   GFR calc Af Amer >90  >90 mL/min  PRO B NATRIURETIC PEPTIDE     Status: Abnormal   Collection Time    02/06/13  3:38 AM      Result Value Range   Pro B Natriuretic peptide (BNP) 126.6 (*) 0 - 125 pg/mL  POCT I-STAT TROPONIN I     Status: None   Collection Time    02/06/13  4:02 AM      Result Value Range   Troponin i, poc 0.00  0.00 - 0.08 ng/mL   Comment 3              Radiology/Studies: Dg Chest Port 1 View  02/06/2013  *RADIOLOGY REPORT*  Clinical Data: Shortness of breath, right jaw pain  PORTABLE CHEST - 1 VIEW  Comparison: None.  Findings: Lungs are predominately clear. No pleural effusion or pneumothorax. The cardiomediastinal contours are within normal limits. The visualized bones and soft tissues are without significant appreciable abnormality.  IMPRESSION: No radiographic evidence of acute cardiopulmonary process.   Original Report Authenticated By: Jearld Lesch, M.D.     EKG::  AF with RVR, diffuse ST depression  ASSESSMENT AND PLAN:   Principal Problem:   Unstable angina  Active Problems:   Atrial fibrillation with rapid ventricular response   Abnormal EKG   Hyperlipidemia   Family history of coronary artery bypass surgery   GERD (gastroesophageal reflux disease)   Depression   DJD (degenerative joint disease) of cervical spine   Nephrolithiasis   Postmenopausal HRT (hormone  replacement therapy)  Plan- Admit, Heparin, Diltiazem, ASA, beta blocker, nitrate. She may need cath and cardioversion- ? timing- will give her clear liquids then NPO till seen by MD this am.  Deland Pretty, PA-C 02/06/2013, 5:31 AM

## 2013-02-06 NOTE — ED Notes (Signed)
MD at bedside. 

## 2013-02-06 NOTE — Progress Notes (Signed)
ANTICOAGULATION CONSULT NOTE - Follow up  Pharmacy Consult for heparin Indication: atrial fibrillation  Allergies  Allergen Reactions  . Adhesive (Tape)     Burn skin  . Erythromycin     Gi intolerance  . Naproxen     abd pain, n/v  . Penicillins     hives    Patient Measurements: Height: 5\' 5"  (165.1 cm) Weight: 172 lb 2.9 oz (78.1 kg) IBW/kg (Calculated) : 57 Heparin Dosing Weight: 72 kg  Vital Signs: Temp: 98.1 F (36.7 C) (03/10 1959) Temp src: Oral (03/10 1959) BP: 112/44 mmHg (03/10 1959) Pulse Rate: 57 (03/10 1959)  Labs:  Recent Labs  02/06/13 0337 02/06/13 0901 02/06/13 1214 02/06/13 1644 02/06/13 2016  HGB 15.1*  --   --   --   --   HCT 43.4  --   --   --   --   PLT 205  --   --   --   --   LABPROT  --  13.9  --   --   --   INR  --  1.08  --   --   --   HEPARINUNFRC  --   --  0.71*  --  0.33  CREATININE 0.69  --   --   --   --   TROPONINI  --  <0.30  --  <0.30  --     Estimated Creatinine Clearance: 73.3 ml/min (by C-G formula based on Cr of 0.69).    Assessment: 67 yo female presented with unstable angina and atrial fibrillation with RVR. Pharmacy consulted to manage IV heparin.  Her heparin level is 0.33 after rate decreased to 850 from 1000 units/hr. HL within goal range.  Troponin negative x 2 checks.  No bleeding reported.    Goal of Therapy:  Heparin level 0.3-0.7 units/ml Monitor platelets by anticoagulation protocol: Yes   Plan:  Continue heparin drip at  850 units/hr  Daily HL and CBC while on heparin. Herby Abraham, Pharm.D. [phone] 02/06/2013 9:11 PM

## 2013-02-06 NOTE — ED Provider Notes (Signed)
History     CSN: [id number]  Arrival date & time 02/06/13  0305   First MD Initiated Contact with Patient 02/06/13 [phone]      Chief Complaint  Patient presents with  . Jaw Pain  . Shortness of Breath    (Consider location/radiation/quality/duration/timing/severity/associated sxs/prior treatment) HPI Complains of right jaw pain and shortness of breath onset upon awakening 12:30 AM today. No other complaint. No treatment prior to coming here nothing makes symptoms better or worse denies chest pain denies abdominal pain denies weakness. No other associated symptoms Past Medical History  Diagnosis Date  . GERD (gastroesophageal reflux disease)   . Allergy   . Hyperlipidemia   . Depression   . Anxiety   . Mitral valve prolapse   . DJD (degenerative joint disease) of cervical spine   . Nephrolithiasis   . Hyperglycemia   . Colon polyps     Past Surgical History  Procedure Laterality Date  . Tubal ligation  1977  . Cesarean section  1976  . Abdominal hysterectomy  1991  . Laparoscopy  1976  . Ganglion cyst excision      Left hand    Family History  Problem Relation Age of Onset  . Stroke Mother   . Diabetes Father   . Heart disease Father   . Arthritis Father     c6-7  . Cancer Father     skin cancer (SCC)    History  Substance Use Topics  . Smoking status: Former Smoker -- 0.50 packs/day for 36 years    Types: Cigarettes    Start date: 03/15/1963    Quit date: 03/15/1999  . Smokeless tobacco: Never Used  . Alcohol Use: No    OB History   Grav Para Term Preterm Abortions TAB SAB Ect Mult Living                  Review of Systems  Constitutional: Negative.   HENT: Negative.        Jaw pain  Respiratory: Positive for shortness of breath.   Cardiovascular: Negative.   Gastrointestinal: Negative.   Musculoskeletal: Negative.   Skin: Negative.   Neurological: Negative.   Psychiatric/Behavioral: Negative.   All other systems reviewed and are  negative.    Allergies  Adhesive; Erythromycin; Naproxen; and Penicillins  Home Medications   Current Outpatient Rx  Name  Route  Sig  Dispense  Refill  . aspirin 81 MG tablet   Oral   Take 81 mg by mouth daily.         . chlorpheniramine-HYDROcodone (TUSSIONEX PENNKINETIC ER) 10-8 MG/5ML LQCR   Oral   Take 5 mLs by mouth every 12 (twelve) hours as needed (cough).   140 mL   0   . estrogens, conjugated, (PREMARIN) 0.3 MG tablet   Oral   Take 1 tablet (0.3 mg total) by mouth daily. Use as directed.   90 tablet   4   . fish oil-omega-3 fatty acids 1000 MG capsule   Oral   Take 2 g by mouth daily.         . fluticasone (FLONASE) 50 MCG/ACT nasal spray   Nasal   Place 2 sprays into the nose daily.   16 g   12   . pravastatin (PRAVACHOL) 40 MG tablet   Oral   Take 1 tablet (40 mg total) by mouth daily.   90 tablet   4   . ranitidine (ZANTAC) 150 MG tablet   Oral  Take 150 mg by mouth 2 (two) times daily.         . sertraline (ZOLOFT) 25 MG tablet   Oral   Take 1 tablet (25 mg total) by mouth daily.   90 tablet   4     BP 159/114  Temp(Src) 97.4 F (36.3 C) (Oral)  Resp 18  SpO2 97%  Physical Exam  Nursing note and vitals reviewed. Constitutional: She appears well-developed and well-nourished.  HENT:  Head: Normocephalic and atraumatic.  Eyes: Conjunctivae are normal. Pupils are equal, round, and reactive to light.  Neck: Neck supple. No tracheal deviation present. No thyromegaly present.  Cardiovascular:  No murmur heard. Tachycardic irregularly irregular  Pulmonary/Chest: Effort normal and breath sounds normal.  Abdominal: Soft. Bowel sounds are normal. She exhibits no distension. There is no tenderness.  Musculoskeletal: Normal range of motion. She exhibits no edema and no tenderness.  Neurological: She is alert. Coordination normal.  Skin: Skin is warm and dry. No rash noted.  Psychiatric: She has a normal mood and affect.    ED  Course  Procedures (including critical care time)  Labs Reviewed  CBC  BASIC METABOLIC PANEL  PRO B NATRIURETIC PEPTIDE   No results found.   No diagnosis found.   Date: 02/06/2013  Rate: 180  Rhythm: atrial fibrillation  QRS Axis: normal  Intervals: normal  ST/T Wave abnormalities: nonspecific ST/T changes  Conduction Disutrbances:none  Narrative Interpretation:   Old EKG Reviewed: none available Results for orders placed during the hospital encounter of 02/06/13  CBC      Result Value Range   WBC 6.1  4.0 - 10.5 K/uL   RBC 4.96  3.87 - 5.11 MIL/uL   Hemoglobin 15.1 (*) 12.0 - 15.0 g/dL   HCT 16.1  09.6 - 04.5 %   MCV 87.5  78.0 - 100.0 fL   MCH 30.4  26.0 - 34.0 pg   MCHC 34.8  30.0 - 36.0 g/dL   RDW 40.9  81.1 - 91.4 %   Platelets 205  150 - 400 K/uL  BASIC METABOLIC PANEL      Result Value Range   Sodium 139  135 - 145 mEq/L   Potassium 3.9  3.5 - 5.1 mEq/L   Chloride 105  96 - 112 mEq/L   CO2 21  19 - 32 mEq/L   Glucose, Bld 158 (*) 70 - 99 mg/dL   BUN 15  6 - 23 mg/dL   Creatinine, Ser 7.82  0.50 - 1.10 mg/dL   Calcium 9.6  8.4 - 95.6 mg/dL   GFR calc non Af Amer >90  >90 mL/min   GFR calc Af Amer >90  >90 mL/min  PRO B NATRIURETIC PEPTIDE      Result Value Range   Pro B Natriuretic peptide (BNP) 126.6 (*) 0 - 125 pg/mL  POCT I-STAT TROPONIN I      Result Value Range   Troponin i, poc 0.00  0.00 - 0.08 ng/mL   Comment 3            Dg Chest Port 1 View  02/06/2013  *RADIOLOGY REPORT*  Clinical Data: Shortness of breath, right jaw pain  PORTABLE CHEST - 1 VIEW  Comparison: None.  Findings: Lungs are predominately clear. No pleural effusion or pneumothorax. The cardiomediastinal contours are within normal limits. The visualized bones and soft tissues are without significant appreciable abnormality.  IMPRESSION: No radiographic evidence of acute cardiopulmonary process.   Original Report Authenticated By: Greig Castilla  DelGaizo, M.D.     Chest x-ray reviewed by  me At 4:20 AM patient feels much improved after treatment with intravenous Cardizem bolus and Cardizem intravenous drip. Heart rate now fluctuates between 118 and 120.  MDM  Spoke with Dr. Allyson Sabal who will arrange for inpatient stay Assessment anginal type symptoms likely due to rate-related ischemia Diagnosis #1 atrial fibrillation with rapid ventricular response #2 angina # 3 hyperglycemia CRITICAL CARE Performed by: Doug Sou   Total critical care time: 30 minute  Critical care time was exclusive of separately billable procedures and treating other patients.  Critical care was necessary to treat or prevent imminent or life-threatening deterioration.  Critical care was time spent personally by me on the following activities: development of treatment plan with patient and/or surrogate as well as nursing, discussions with consultants, evaluation of patient's response to treatment, examination of patient, obtaining history from patient or surrogate, ordering and performing treatments and interventions, ordering and review of laboratory studies, ordering and review of radiographic studies, pulse oximetry and re-evaluation of patient's condition.       Doug Sou, MD 02/06/13 [phone]

## 2013-02-06 NOTE — ED Notes (Signed)
Pt woke up with bilateral jaw pain (R worse than L) at 12:30 am.  Also reports sob and tightness across chest.

## 2013-02-06 NOTE — Progress Notes (Signed)
ANTICOAGULATION CONSULT NOTE - Follow up  Pharmacy Consult for heparin Indication: atrial fibrillation  Allergies  Allergen Reactions  . Adhesive (Tape)     Burn skin  . Erythromycin     Gi intolerance  . Naproxen     abd pain, n/v  . Penicillins     hives    Patient Measurements:   Heparin Dosing Weight: 72 kg  Vital Signs: BP: 100/50 mmHg (03/10 1056) Pulse Rate: 56 (03/10 1045)  Labs:  Recent Labs  02/06/13 0337 02/06/13 0901 02/06/13 1214  HGB 15.1*  --   --   HCT 43.4  --   --   PLT 205  --   --   LABPROT  --  13.9  --   INR  --  1.08  --   HEPARINUNFRC  --   --  0.71*  CREATININE 0.69  --   --   TROPONINI  --  <0.30  --     The CrCl is unknown because both a height and weight (above a minimum accepted value) are required for this calculation.   Medical History: Past Medical History  Diagnosis Date  . GERD (gastroesophageal reflux disease)   . Allergy   . Hyperlipidemia   . Depression   . Anxiety   . Mitral valve prolapse   . DJD (degenerative joint disease) of cervical spine   . Nephrolithiasis   . Hyperglycemia   . Colon polyps     Medications:  Scheduled:  . [COMPLETED] aspirin  324 mg Oral Once  . [COMPLETED] diltiazem  30 mg Oral Once  . nitroGLYCERIN  0.5 inch Topical Q6H    Assessment: 67 yo female presented with unstable angina and atrial fibrillation with RVR. Pharmacy consulted to manage IV heparin. First heparin level back is slightly supratherapeutic, will decrease rate.   Goal of Therapy:  Heparin level 0.3-0.7 units/ml Monitor platelets by anticoagulation protocol: Yes   Plan:  1. Decrease heparin to 850 units/hr and check 6 heparin level.  Verlene Mayer, PharmD, BCPS Pager [phone] 02/06/2013,3:53 PM

## 2013-02-06 NOTE — H&P (Signed)
Pt. Seen and examined. Agree with the NP/PA-C note as written. 67 yo female awakened from sleep with chest pressure, tachycardia, jaw pain and nausea - on presentation to the ER was found to be in a-fib with RVR - significant anterolateral ST depression noted on EKG.  Spontaneously converted to sinus rhythm - now on heparin with a nitropaste patch. 1st troponin is negative, BNP 126, exam benign. Family history of premature CAD in father, prior smoker (1/2 ppd x 30 years), dylipidemia.  Agree with admission. Check 2D echo today. Plan NPO p MN for lexiscan NST tomorrow. She is agreeable to this plan.  Chrystie Nose, MD, [facility] Attending Cardiologist [facility]

## 2013-02-06 NOTE — ED Notes (Signed)
PT reports HA . Pt denies any  CP or neck pain

## 2013-02-06 NOTE — ED Notes (Signed)
Paged Cardiology.  Notified patient converted from A-fib to NSR.

## 2013-02-06 NOTE — ED Notes (Signed)
Spoke with Cardiology. Give diltiazem 30 mg PO now then stop diltiazem IV infusion.

## 2013-02-07 ENCOUNTER — Inpatient Hospital Stay (HOSPITAL_COMMUNITY): Payer: Self-pay

## 2013-02-07 LAB — CBC
HCT: 35 % — ABNORMAL LOW (ref 36.0–46.0)
Hemoglobin: 12.2 g/dL (ref 12.0–15.0)
MCH: 30.2 pg (ref 26.0–34.0)
MCHC: 34.9 g/dL (ref 30.0–36.0)
MCV: 86.6 fL (ref 78.0–100.0)
Platelets: 153 10*3/uL (ref 150–400)
RBC: 4.04 MIL/uL (ref 3.87–5.11)
RDW: 13.7 % (ref 11.5–15.5)
WBC: 5 10*3/uL (ref 4.0–10.5)

## 2013-02-07 LAB — BASIC METABOLIC PANEL
BUN: 12 mg/dL (ref 6–23)
CO2: 26 mEq/L (ref 19–32)
Calcium: 8.9 mg/dL (ref 8.4–10.5)
Chloride: 108 mEq/L (ref 96–112)
Creatinine, Ser: 0.74 mg/dL (ref 0.50–1.10)
GFR calc Af Amer: >90 mL/min (ref >90)
GFR calc non Af Amer: 88 mL/min — ABNORMAL LOW (ref >90)
Glucose, Bld: 120 mg/dL — ABNORMAL HIGH (ref 70–99)
Potassium: 3.8 mEq/L (ref 3.5–5.1)
Sodium: 140 mEq/L (ref 135–145)

## 2013-02-07 LAB — TSH: TSH: 2.091 u[IU]/mL (ref 0.350–4.500)

## 2013-02-07 LAB — HEPARIN LEVEL (UNFRACTIONATED): Heparin Unfractionated: 0.3 IU/mL (ref 0.30–0.70)

## 2013-02-07 MED ORDER — RIVAROXABAN 20 MG PO TABS: 20.0000 mg | ORAL_TABLET | Freq: Every day | ORAL | Status: DC

## 2013-02-07 MED ORDER — TECHNETIUM TC 99M SESTAMIBI GENERIC - CARDIOLITE
30.0000 | Freq: Once | INTRAVENOUS | Status: AC | PRN
  Administered 2013-02-07: 30 via INTRAVENOUS

## 2013-02-07 MED ORDER — RIVAROXABAN 20 MG PO TABS
20.0000 mg | ORAL_TABLET | Freq: Every day | ORAL | Status: DC
  Filled 2013-02-07: qty 1

## 2013-02-07 MED ORDER — METOPROLOL TARTRATE 25 MG PO TABS: 25.0000 mg | ORAL_TABLET | Freq: Two times a day (BID) | ORAL | Status: DC

## 2013-02-07 MED ORDER — REGADENOSON 0.4 MG/5ML IV SOLN
0.4000 mg | Freq: Once | INTRAVENOUS | Status: AC
  Administered 2013-02-07: 0.4 mg via INTRAVENOUS

## 2013-02-07 MED ORDER — TECHNETIUM TC 99M SESTAMIBI GENERIC - CARDIOLITE
10.0000 | Freq: Once | INTRAVENOUS | Status: AC | PRN
  Administered 2013-02-07: 10 via INTRAVENOUS

## 2013-02-07 NOTE — Progress Notes (Signed)
  Echocardiogram 2D Echocardiogram has been performed.  Ellender Hose A 02/07/2013, 11:56 AM

## 2013-02-07 NOTE — Progress Notes (Signed)
ANTICOAGULATION CONSULT NOTE - Follow up  Pharmacy Consult for heparin Indication: atrial fibrillation  Patient Measurements: Height: 5\' 5"  (165.1 cm) Weight: 172 lb 9.9 oz (78.3 kg) IBW/kg (Calculated) : 57 Heparin Dosing Weight: 72 kg  Labs:  Recent Labs  02/06/13 0337 02/06/13 0901 02/06/13 1214 02/06/13 1644 02/06/13 2016 02/07/13 0457  HGB 15.1*  --   --   --   --  12.2  HCT 43.4  --   --   --   --  35.0*  PLT 205  --   --   --   --  153  LABPROT  --  13.9  --   --   --   --   INR  --  1.08  --   --   --   --   HEPARINUNFRC  --   --  0.71*  --  0.33 0.30  CREATININE 0.69  --   --   --   --  0.74  TROPONINI  --  <0.30  --  <0.30  --   --     Estimated Creatinine Clearance: 73.5 ml/min (by C-G formula based on Cr of 0.74).    Assessment: 67 yo female presented with unstable angina and atrial fibrillation with RVR. Pharmacy consulted to manage IV heparin.  Her heparin level is 0.3 on 850 units/hr which is within goal.  Troponin negative x 2 checks.  No bleeding reported.    Goal of Therapy:  Heparin level 0.3-0.7 units/ml Monitor platelets by anticoagulation protocol: Yes   Plan:  Increase heparin drip to 900 units/hr  Daily HL and CBC while on heparin.  Celedonio Miyamoto, PharmD, BCPS Clinical Pharmacist Pager [phone]   02/07/2013 8:35 AM

## 2013-02-07 NOTE — Progress Notes (Signed)
[facility]  Subjective: No further jaw pain or SOB  Objective: Vital signs in last 24 hours: Temp:  [97.4 F (36.3 C)-98.1 F (36.7 C)] 97.4 F (36.3 C) (03/11 0801) Pulse Rate:  [56-69] 56 (03/11 0900) Resp:  [12-22] 14 (03/11 0900) BP: (97-140)/(43-58) 129/58 mmHg (03/11 0900) SpO2:  [94 %-99 %] 96 % (03/11 0900) Weight:  [78.1 kg (172 lb 2.9 oz)-78.3 kg (172 lb 9.9 oz)] 78.3 kg (172 lb 9.9 oz) (03/11 0500) Last BM Date: 02/05/13  Intake/Output from previous day: 03/10 0701 - 03/11 0700 In: 373.5 [P.O.:240; I.V.:133.5] Out: 500 [Urine:500] Intake/Output this shift: Total I/O In: -  Out: 300 [Urine:300]  Medications Current Facility-Administered Medications  Medication Dose Route Frequency Provider Last Rate Last Dose  . 0.9 %  sodium chloride infusion  250 mL Intravenous PRN Abelino Derrick, PA-C      . 0.9 %  sodium chloride infusion   Intravenous Continuous Abelino Derrick, PA-C 75 mL/hr at 02/06/13 2050 75 mL/hr at 02/06/13 2050  . acetaminophen (TYLENOL) tablet 650 mg  650 mg Oral Q4H PRN Abelino Derrick, PA-C   650 mg at 02/06/13 2112  . ALPRAZolam Prudy Feeler) tablet 0.25 mg  0.25 mg Oral BID PRN Abelino Derrick, PA-C      . aspirin EC tablet 81 mg  81 mg Oral Daily Abelino Derrick, PA-C   81 mg at 02/06/13 2113  . diltiazem (CARDIZEM) 100 mg in dextrose 5 % 100 mL infusion  5-15 mg/hr Intravenous Titrated Doug Sou, MD   10 mg/hr at 02/06/13 0512  . estrogens (conjugated) (PREMARIN) tablet 0.3 mg  0.3 mg Oral Daily Abelino Derrick, PA-C   0.3 mg at 02/06/13 2113  . fluticasone (FLONASE) 50 MCG/ACT nasal spray 2 spray  2 spray Each Nare Daily Luke K Kilroy, PA-C      . heparin ADULT infusion 100 units/mL (25000 units/250 mL)  900 Units/hr Intravenous Continuous Runell Gess, MD 8.5 mL/hr at 02/07/13 0347 850 Units/hr at 02/07/13 0347  . metoprolol tartrate (LOPRESSOR) tablet 25 mg  25 mg Oral BID Eda Paschal Kilroy, PA-C      . nitroGLYCERIN  (NITROGLYN) 2 % ointment 0.5 inch  0.5 inch Topical Q6H Abelino Derrick, PA-C   0.5 inch at 02/06/13 1191  . nitroGLYCERIN (NITROSTAT) SL tablet 0.4 mg  0.4 mg Sublingual Q5 Min x 3 PRN Eda Paschal Kilroy, PA-C      . omega-3 acid ethyl esters (LOVAZA) capsule 1 g  1 g Oral BID Lauren Bajbus, RPH   1 g at 02/06/13 2115  . ondansetron (ZOFRAN) injection 4 mg  4 mg Intravenous Q6H PRN Abelino Derrick, PA-C      . pantoprazole (PROTONIX) EC tablet 40 mg  40 mg Oral Q0600 Eda Paschal Kilroy, PA-C      . regadenoson (LEXISCAN) injection SOLN 0.4 mg  0.4 mg Intravenous Once Chrystie Nose, MD      . sertraline (ZOLOFT) tablet 25 mg  25 mg Oral Daily Abelino Derrick, PA-C   25 mg at 02/06/13 2116  . simvastatin (ZOCOR) tablet 10 mg  10 mg Oral q1800 Eda Paschal [place], PA-C   10 mg at 02/06/13 2116  . sodium chloride 0.9 % injection 3 mL  3 mL Intravenous Q12H Abelino Derrick, PA-C   3 mL at 02/06/13 2117  . sodium chloride 0.9 % injection 3 mL  3 mL Intravenous PRN Eda Paschal  Kilroy, PA-C      . technetium sestamibi generic (CARDIOLITE) injection 30 milli Curie  30 milli Curie Intravenous Once PRN Medication Radiologist, MD      . zolpidem (AMBIEN) tablet 5 mg  5 mg Oral QHS PRN Abelino Derrick, PA-C        PE: General appearance: alert, cooperative and no distress Lungs: clear to auscultation bilaterally Heart: regular rate and rhythm, S1, S2 normal, no murmur, click, rub or gallop Extremities: No LEE Pulses: 2+ and symmetric Skin: Warm and dry  Neurologic: Grossly normal  Lab Results:   Recent Labs  02/06/13 0337 02/07/13 0457  WBC 6.1 5.0  HGB 15.1* 12.2  HCT 43.4 35.0*  PLT 205 153   BMET  Recent Labs  02/06/13 0337 02/07/13 0457  NA 139 140  K 3.9 3.8  CL 105 108  CO2 21 26  GLUCOSE 158* 120*  BUN 15 12  CREATININE 0.69 0.74  CALCIUM 9.6 8.9   PT/INR  Recent Labs  02/06/13 0901  LABPROT 13.9  INR 1.08    Assessment/Plan  Principal Problem:   Unstable angina Active Problems:   GERD  (gastroesophageal reflux disease)   Hyperlipidemia   Depression   DJD (degenerative joint disease) of cervical spine   Nephrolithiasis   Postmenopausal HRT (hormone replacement therapy)   Atrial fibrillation with rapid ventricular response   Family history of coronary artery bypass surgery  Plan:  The patient was seen in [facility] for [employer]  She tolerated the test well with complaints of SOB and lightheaded feeling, HA.  Symptoms resolved.  ST segment and TWI noted during the test.  Results to follow this afternoon.   LOS: 1 day    HAGER, BRYAN 02/07/2013 11:30 AM

## 2013-02-07 NOTE — Progress Notes (Addendum)
Pt. Seen and examined. Agree with the NP/PA-C note as written. Nuclear stress test shows no reversible ischemia. Will review echocardiogram. Unknown what triggered a-fib, although she had been taking mucinex-D.  Recommended she avoid pseudoephedrine, stimulants and b-agonists as much as possible in the future. Transition from heparin to xarelto 20 mg daily for stroke prevention for at least 3 months - CHADS2VASC score is low 1 - may be able to treat with aspirin thereafter.  Metoprolol tartrate 25 mg BID is a new medicine and should be continued at home.  Can d/c aspirin while on xarelto. Follow-up with myself in the office or MLP in 7-10 days. She will need a work excuse until Monday 3/17, then back with no restrictions.  Chrystie Nose, MD, [facility] Attending Cardiologist [facility]

## 2013-02-09 NOTE — Discharge Summary (Signed)
Physician Discharge Summary  Patient ID: Daisy Nelson MRN: [id number] DOB/AGE: 65/29/1947 65 y.o.  Admit date: 02/06/2013 Discharge date: 02/09/2013  Admission Diagnoses:  Unstable angina  Discharge Diagnoses:  Principal Problem:   Unstable angina Active Problems:   GERD (gastroesophageal reflux disease)   Hyperlipidemia   Depression   DJD (degenerative joint disease) of cervical spine   Nephrolithiasis   Postmenopausal HRT (hormone replacement therapy)   Atrial fibrillation with rapid ventricular response   Family history of coronary artery bypass surgery   Discharged Condition: Stable   Hospital Course:   67 y/o female with no prior history of CAD or AF, presented to the ER 3/10 around 3am with complaints of jaw pain, palpitations, and SOB. Her symptoms woke her from sleep around 12:30am. In the ER she was noted to be in Rapid AF. Her EKG shows diffuse ST depression. Initial Troponin is negative. Her symptoms are improved after Diltiazem IV was started. She denies any prior history of similar symptoms in the past. She did take Mucinex D earlier this week for sinus infection.  The patient was admitted to [facility] and started on diltiazem, heparin, ASSA, beta blocker and nitrates.  She spontaneously converted to NSR and ruled out for MI.  Lexiscan myovew revealed normal LVF, no ischemia and fixed defects involving the apical segments of the inferior septum and inferior lateral walls.  2D echo confirmed normal EF of 55-60%. Mild TR.  CHADSVSC= 1.  She was started on Xarelto for  three months then treat with ASA.  Lopressor was continued.  She was discharged in stable condition after being seen by Dr. Herbie Baltimore.      Consults: None  Significant Diagnostic Studies:  2D Echo Study Conclusions  - Left ventricle: The cavity size was normal. There was moderate concentric hypertrophy. Systolic function was normal. The estimated ejection fraction was in the range of 55% to 60%. The  study is not technically sufficient to allow evaluation of LV diastolic function due to lack of tissue doppler. - Mitral valve: Mildly thickened leaflets . Mild regurgitation. - Left atrium: The atrium was normal in size. - Tricuspid valve: Mild regurgitation. - Pulmonary arteries: PA peak pressure: 32mm Hg (S). - Inferior vena cava: The vessel was normal in size; the respirophasic diameter changes were in the normal range (= 50%); findings are consistent with normal central venous pressure.    MYOCARDIAL IMAGING WITH SPECT (REST AND PHARMACOLOGIC-STRESS)  Findings: Between the rest and stress images there are mild fixed  defects involving the apical segments of the anterior septum and  inferior lateral wall. No pharmacologically induced myocardial  reversibility noted.  GATED LEFT VENTRICULAR WALL MOTION STUDY  Findings: Review of the gated images demonstrates normal left  ventricular wall motion and thickening.  LEFT VENTRICULAR EJECTION FRACTION  Findings: QGS ejection fraction measures 76% , with an end-  diastolic volume of 56 ml and an end-systolic volume of 14 ml.  IMPRESSION:  1. Normal left ventricular systolic function.  2. No pharmacologically induced myocardial ischemia.  3. Fixed defects are noted involving the apical segments of the  anterior septum and the inferior lateral wall  CBC    Component Value Date/Time   WBC 5.0 02/07/2013 0457   RBC 4.04 02/07/2013 0457   HGB 12.2 02/07/2013 0457   HCT 35.0* 02/07/2013 0457   PLT 153 02/07/2013 0457   MCV 86.6 02/07/2013 0457   MCH 30.2 02/07/2013 0457   MCHC 34.9 02/07/2013 0457   RDW 13.7 02/07/2013  0457   LYMPHSABS 1.1 11/15/2012 0850   MONOABS 0.6 11/15/2012 0850   EOSABS 0.1 11/15/2012 0850   BASOSABS 0.0 11/15/2012 0850    BMET    Component Value Date/Time   NA 140 02/07/2013 0457   K 3.8 02/07/2013 0457   CL 108 02/07/2013 0457   CO2 26 02/07/2013 0457   GLUCOSE 120* 02/07/2013 0457   BUN 12 02/07/2013 0457    CREATININE 0.74 02/07/2013 0457   CREATININE 0.81 11/15/2012 0850   CALCIUM 8.9 02/07/2013 0457   GFRNONAA 88* 02/07/2013 0457   GFRAA >90 02/07/2013 0457    Treatments: See above   Discharge Exam: Blood pressure 136/65, pulse 64, temperature 97.4 F (36.3 C), temperature source Axillary, resp. rate 18, height 5\' 5"  (1.651 m), weight 78.3 kg (172 lb 9.9 oz), SpO2 98.00%.   Disposition: 01-Home or Self Care  Discharge Orders   Future Appointments Provider Department Dept Phone   03/09/2013 2:00 PM [facility] [phone]   Future Orders Complete By Expires     Diet - low sodium heart healthy  As directed     Discharge instructions  As directed     Comments:      May return to work on Monday, February 13, 2013.    Increase activity slowly  As directed         Medication List    STOP taking these medications       aspirin 81 MG tablet      TAKE these medications       estrogens (conjugated) 0.3 MG tablet  Commonly known as:  PREMARIN  Take 1 tablet (0.3 mg total) by mouth daily. Use as directed.     fish oil-omega-3 fatty acids 1000 MG capsule  Take 1 g by mouth 3 (three) times daily.     fluticasone 50 MCG/ACT nasal spray  Commonly known as:  FLONASE  Place 2 sprays into the nose daily.     metoprolol tartrate 25 MG tablet  Commonly known as:  LOPRESSOR  Take 1 tablet (25 mg total) by mouth 2 (two) times daily.     pravastatin 40 MG tablet  Commonly known as:  PRAVACHOL  Take 1 tablet (40 mg total) by mouth daily.     ranitidine 150 MG tablet  Commonly known as:  ZANTAC  Take 150 mg by mouth 2 (two) times daily.     Rivaroxaban 20 MG Tabs  Commonly known as:  XARELTO  Take 1 tablet (20 mg total) by mouth daily with supper.     sertraline 25 MG tablet  Commonly known as:  ZOLOFT  Take 1 tablet (25 mg total) by mouth daily.           Follow-up Information   Follow up with Chrystie Nose, MD. (Our office will call  you with the appt. date and time.)    Contact information:   [address] [place] [address] [phone]       Signed: Wilburt Finlay 02/09/2013, 2:30 PM

## 2013-03-09 ENCOUNTER — Ambulatory Visit: Payer: Self-pay | Admitting: Physician Assistant

## 2013-03-30 HISTORY — PX: CATARACT EXTRACTION W/ INTRAOCULAR LENS  IMPLANT, BILATERAL: SHX1307

## 2013-05-16 ENCOUNTER — Ambulatory Visit (INDEPENDENT_AMBULATORY_CARE_PROVIDER_SITE_OTHER): Payer: Medicare Other | Admitting: Physician Assistant

## 2013-05-16 ENCOUNTER — Encounter: Payer: Self-pay | Admitting: Physician Assistant

## 2013-05-16 VITALS — BP 120/76 | HR 60 | Temp 99.0°F | Resp 16 | Ht 64.0 in | Wt 165.0 lb

## 2013-05-16 DIAGNOSIS — Z1239 Encounter for other screening for malignant neoplasm of breast: Secondary | ICD-10-CM

## 2013-05-16 DIAGNOSIS — R739 Hyperglycemia, unspecified: Secondary | ICD-10-CM

## 2013-05-16 DIAGNOSIS — Z13820 Encounter for screening for osteoporosis: Secondary | ICD-10-CM

## 2013-05-16 DIAGNOSIS — F411 Generalized anxiety disorder: Secondary | ICD-10-CM

## 2013-05-16 DIAGNOSIS — E785 Hyperlipidemia, unspecified: Secondary | ICD-10-CM

## 2013-05-16 DIAGNOSIS — F419 Anxiety disorder, unspecified: Secondary | ICD-10-CM

## 2013-05-16 DIAGNOSIS — F32A Depression, unspecified: Secondary | ICD-10-CM

## 2013-05-16 DIAGNOSIS — R7309 Other abnormal glucose: Secondary | ICD-10-CM

## 2013-05-16 DIAGNOSIS — I4891 Unspecified atrial fibrillation: Secondary | ICD-10-CM

## 2013-05-16 DIAGNOSIS — Z1159 Encounter for screening for other viral diseases: Secondary | ICD-10-CM

## 2013-05-16 DIAGNOSIS — F329 Major depressive disorder, single episode, unspecified: Secondary | ICD-10-CM

## 2013-05-16 DIAGNOSIS — B351 Tinea unguium: Secondary | ICD-10-CM

## 2013-05-16 DIAGNOSIS — L03011 Cellulitis of right finger: Secondary | ICD-10-CM

## 2013-05-16 DIAGNOSIS — F3289 Other specified depressive episodes: Secondary | ICD-10-CM

## 2013-05-16 LAB — COMPREHENSIVE METABOLIC PANEL
ALT: 18 U/L (ref 0–35)
AST: 16 U/L (ref 0–37)
Albumin: 4 g/dL (ref 3.5–5.2)
Alkaline Phosphatase: 83 U/L (ref 39–117)
BUN: 12 mg/dL (ref 6–23)
CO2: 25 mEq/L (ref 19–32)
Calcium: 9.3 mg/dL (ref 8.4–10.5)
Chloride: 103 mEq/L (ref 96–112)
Creat: 0.79 mg/dL (ref 0.50–1.10)
Glucose, Bld: 103 mg/dL — ABNORMAL HIGH (ref 70–99)
Potassium: 4.5 mEq/L (ref 3.5–5.3)
Sodium: 138 mEq/L (ref 135–145)
Total Bilirubin: 0.4 mg/dL (ref 0.3–1.2)
Total Protein: 6.8 g/dL (ref 6.0–8.3)

## 2013-05-16 LAB — LIPID PANEL
Cholesterol: 186 mg/dL (ref 0–200)
HDL: 60 mg/dL (ref >39)
LDL Cholesterol: 96 mg/dL (ref 0–99)
Total CHOL/HDL Ratio: 3.1 Ratio
Triglycerides: 151 mg/dL — ABNORMAL HIGH (ref <150)
VLDL: 30 mg/dL (ref 0–40)

## 2013-05-16 LAB — HEPATITIS C ANTIBODY: HCV Ab: NEGATIVE

## 2013-05-16 LAB — GLUCOSE, POCT (MANUAL RESULT ENTRY): POC Glucose: 100 mg/dl — AB (ref 70–99)

## 2013-05-16 LAB — POCT GLYCOSYLATED HEMOGLOBIN (HGB A1C): Hemoglobin A1C: 5.9

## 2013-05-16 MED ORDER — CEPHALEXIN 250 MG PO CAPS: 250.0000 mg | ORAL_CAPSULE | Freq: Three times a day (TID) | ORAL | Status: DC

## 2013-05-16 NOTE — Progress Notes (Signed)
  Subjective:    Patient ID: Daisy Nelson, female    DOB: 04/02/1946, 67 y.o.   MRN: [id number]  HPI This 67 y.o. female presents for evaluation of Hyperlipidemia, Anxiety/Depression and hyperglycemia.  Since last visit 10/2012, has had cataract extraction bilaterally (03/2013) and was briefly hospitalized with AFib with RPR (01/2013).  She's noticed tenderness of the RIGHT great toe, where she has fungus in the nail causing it to be discolored and thickened.  She thought there was "a little sliver" of the nail in the corner, and so trimmed the corner way down, which temporarily relieved the discomfort, but it's beginning to recur. She's had no drainage or swelling from the area, and no increased redness.  Past medical history, surgical history, family history, social history and problem list reviewed.  Review of Systems As above. No chest pain, SOB, HA, dizziness, vision change, N/V, diarrhea, constipation, dysuria, urinary urgency or frequency, myalgias, arthralgias or rash.     Objective:   Physical Exam Blood pressure 120/76, pulse 60, temperature 99 F (37.2 C), temperature source Oral, resp. rate 16, height 5\' 4"  (1.626 m), weight 165 lb (74.844 kg), SpO2 98.00%. Body mass index is 28.31 kg/(m^2). Well-developed, well nourished WF who is awake, alert and oriented, in NAD. HEENT: [place]/AT, sclera and conjunctiva are clear.   Neck: supple, non-tender, no lymphadenopathy, thyromegaly. Heart: RRR, no murmur Lungs: normal effort, CTA Extremities: no cyanosis, clubbing or edema. Skin: warm and dry without rash. RIGHT great toenail is hypertrophic and a portion of the nail (central distal nail) is opaque.  There does not appear to be an ingrown nail, but the lateral corner of the nail is trimmed too short and the nail fold along it is tender on palpation. Psychologic: good mood and appropriate affect, normal speech and behavior.  Results for orders placed in visit on 05/16/13  GLUCOSE,  POCT (MANUAL RESULT ENTRY)      Result Value Range   POC Glucose 100 (*) 70 - 99 mg/dl  POCT GLYCOSYLATED HEMOGLOBIN (HGB A1C)      Result Value Range   Hemoglobin A1C 5.9           Assessment & Plan:  Hyperlipidemia - Plan: Comprehensive metabolic panel, Lipid panel  Hyperglycemia - Plan: POCT glucose (manual entry), POCT glycosylated hemoglobin (Hb A1C)  Depression/Anxiety  Atrial fibrillation with rapid ventricular response, regular today, on rate control.  Screening for breast cancer - Plan: MM Digital Screening  Screening for osteoporosis - Plan: DG Bone Density  Paronychia, right - Plan: cephALEXin (KEFLEX) 250 MG capsule  Onychomycosis  Need for hepatitis C screening test - Plan: Hepatitis C antibody  Patient Instructions  Let the nail on the great toe get longer-so the corner is free from the skin.  As it grows, tuck a small piece of cotton under the corner to help lift it as it grows out. Check out [facility] for their recommendation on the use of Vick's Vapo Rub on nail fungus. Continue working on healthy eating and regular exercise to control your blood sugar and reduce your cardiovascular risk factors.   Continue current treatment, including efforts for healthy lifestyle modification.  Fernande Bras, PA-C Physician Assistant-Certified [facility]

## 2013-05-16 NOTE — Patient Instructions (Addendum)
Let the nail on the great toe get longer-so the corner is free from the skin.  As it grows, tuck a small piece of cotton under the corner to help lift it as it grows out. Check out [facility] for their recommendation on the use of Vick's Vapo Rub on nail fungus. Continue working on healthy eating and regular exercise to control your blood sugar and reduce your cardiovascular risk factors.

## 2013-05-17 ENCOUNTER — Other Ambulatory Visit: Payer: Self-pay | Admitting: Radiology

## 2013-05-17 DIAGNOSIS — E2839 Other primary ovarian failure: Secondary | ICD-10-CM

## 2013-06-13 ENCOUNTER — Ambulatory Visit
Admission: RE | Admit: 2013-06-13 | Discharge: 2013-06-13 | Disposition: A | Discharge status: Home / Self Care | Payer: Medicare Other | Source: Ambulatory Visit | Attending: Physician Assistant | Admitting: Physician Assistant

## 2013-06-13 DIAGNOSIS — Z1239 Encounter for other screening for malignant neoplasm of breast: Secondary | ICD-10-CM

## 2013-06-13 DIAGNOSIS — E2839 Other primary ovarian failure: Secondary | ICD-10-CM

## 2013-07-05 ENCOUNTER — Other Ambulatory Visit: Payer: Self-pay

## 2013-07-06 ENCOUNTER — Other Ambulatory Visit: Payer: Self-pay | Admitting: Physician Assistant

## 2013-07-10 ENCOUNTER — Ambulatory Visit (INDEPENDENT_AMBULATORY_CARE_PROVIDER_SITE_OTHER): Payer: Medicare Other | Admitting: Physician Assistant

## 2013-07-10 ENCOUNTER — Encounter: Payer: Self-pay | Admitting: Physician Assistant

## 2013-07-10 VITALS — BP 112/60 | HR 52 | Ht 65.0 in | Wt 168.7 lb

## 2013-07-10 DIAGNOSIS — I480 Paroxysmal atrial fibrillation: Secondary | ICD-10-CM

## 2013-07-10 DIAGNOSIS — I4891 Unspecified atrial fibrillation: Secondary | ICD-10-CM

## 2013-07-10 NOTE — Assessment & Plan Note (Signed)
Maintaining sinus bradycardia.  She reports only ocassional fluttering.  Will continue lopressor at 12.5mg  bid.  She will stop Xarelto after she runs out and then continue with 325mg  of ASA daily.  Follow up with Dr. Rennis Golden in 6 months.

## 2013-07-10 NOTE — Progress Notes (Signed)
Date:  07/10/2013   ID:  TIAWANA FORGY, DOB 24-Jul-1946, MRN [id number]  PCP:  JEFFERY,CHELLE, PA-C  Primary Cardiologist:  [facility]     History of Present Illness: Daisy Nelson is a 67 y.o. female no prior history of CAD or AF, presented to the ER 3/10 around 3am with complaints of jaw pain, palpitations, and SOB. Her symptoms woke her from sleep around 12:30am. In the ER she was noted to be in Rapid AF. Her EKG shows diffuse ST depression. Initial Troponin is negative. Her symptoms improved after Diltiazem IV was started. She denied any prior history of similar symptoms in the past. She did take Mucinex D earlier this week for sinus infection.   The patient was admitted to [facility] and started on diltiazem, heparin, ASA, beta blocker and nitrates. She spontaneously converted to NSR and ruled out for MI. Lexiscan myovew revealed normal LVF, no ischemia and fixed defects involving the apical segments of the inferior septum and inferior lateral walls. 2D echo confirmed normal EF of 55-60%. Mild TR. CHADSVSC= 1. She was started on Xarelto.  Lopressor was continued.   The patient presents today for follow up.  When I saw her last she was about to have cataract surgery.  It went very well for her and she can now see clearly.  She reports only an accessional fluttering in her chest.  I did decrease her lopressor to 12.5 mg bid during the last visit because she was having a HR in the 40's.  She otherwise denies  nausea, vomiting, fever, chest pain, shortness of breath, orthopnea, dizziness, PND, cough, congestion, abdominal pain, hematochezia, melena, lower extremity edema.  Wt Readings from Last 3 Encounters:  07/10/13 168 lb 11.2 oz (76.522 kg)  05/16/13 165 lb (74.844 kg)  02/07/13 172 lb 9.9 oz (78.3 kg)     Past Medical History  Diagnosis Date  . GERD (gastroesophageal reflux disease)   . Allergy   . Hyperlipidemia   . Depression   . Anxiety   . Mitral valve prolapse   . DJD  (degenerative joint disease) of cervical spine   . Nephrolithiasis   . Hyperglycemia   . Colon polyps     Current Outpatient Prescriptions  Medication Sig Dispense Refill  . estrogens, conjugated, (PREMARIN) 0.3 MG tablet Take 1 tablet (0.3 mg total) by mouth daily. Use as directed.  90 tablet  4  . fish oil-omega-3 fatty acids 1000 MG capsule Take 1 g by mouth 3 (three) times daily.       . fluticasone (FLONASE) 50 MCG/ACT nasal spray USE 2 SPRAYS IN EACH NOSTRIL ONCE DAILY  16 g  12  . metoprolol tartrate (LOPRESSOR) 25 MG tablet Take 12.5 mg by mouth 2 (two) times daily.      . pravastatin (PRAVACHOL) 40 MG tablet Take 1 tablet (40 mg total) by mouth daily.  90 tablet  4  . ranitidine (ZANTAC) 150 MG tablet Take 150 mg by mouth 2 (two) times daily.      . Rivaroxaban (XARELTO) 20 MG TABS Take 1 tablet (20 mg total) by mouth daily with supper.  30 tablet  3  . sertraline (ZOLOFT) 25 MG tablet Take 1 tablet (25 mg total) by mouth daily.  90 tablet  4   No current facility-administered medications for this visit.    Allergies:    Allergies  Allergen Reactions  . Adhesive (Tape)     Burn skin  . Erythromycin  Gi intolerance  . Naproxen     abd pain, n/v  . Penicillins     hives    Social History:  The patient  reports that she quit smoking about 14 years ago. Her smoking use included Cigarettes. She started smoking about 50 years ago. She has a 18 pack-year smoking history. She has never used smokeless tobacco. She reports that she does not drink alcohol or use illicit drugs.   Family history:   Family History  Problem Relation Age of Onset  . Stroke Mother   . Diabetes Father   . Heart disease Father     CABG  . Arthritis Father     c6-7  . Cancer Father     skin cancer (SCC)    ROS:  Please see the history of present illness.  All other systems reviewed and negative.   PHYSICAL EXAM: VS:  BP 112/60  Pulse 52  Ht 5\' 5"  (1.651 m)  Wt 168 lb 11.2 oz (76.522 kg)   BMI 28.07 kg/m2 Well nourished, well developed, in no acute distress HEENT: Pupils are equal round react to light accommodation extraocular movements are intact.  Cardiac: Regular rate and rhythm without murmurs rubs or gallops. Lungs:  clear to auscultation bilaterally, no wheezing, rhonchi or rales Ext: no lower extremity edema.  2+ radial and dorsalis pedis pulses. Skin: warm and dry Neuro:  Grossly normal  EKG:    Sinus brady 52,  Nonspecific T wave/ST changes.  ASSESSMENT AND PLAN:  Problem List Items Addressed This Visit   PAF (paroxysmal atrial fibrillation)     Maintaining sinus bradycardia.  She reports only ocassional fluttering.  Will continue lopressor at 12.5mg  bid.  She will stop Xarelto after she runs out and then continue with 325mg  of ASA daily.  Follow up with Dr. Rennis Golden in 6 months.    Relevant Medications      metoprolol tartrate (LOPRESSOR) 25 MG tablet    Other Visit Diagnoses   Atrial fibrillation    -  Primary    Relevant Medications       metoprolol tartrate (LOPRESSOR) 25 MG tablet    Other Relevant Orders       EKG 12-Lead

## 2013-07-10 NOTE — Patient Instructions (Signed)
Follow up with Dr. Rennis Golden in six months.

## 2013-07-14 ENCOUNTER — Ambulatory Visit: Payer: Medicare Other | Admitting: Internal Medicine

## 2013-08-03 ENCOUNTER — Encounter: Payer: Self-pay | Admitting: Physician Assistant

## 2013-08-03 ENCOUNTER — Ambulatory Visit (INDEPENDENT_AMBULATORY_CARE_PROVIDER_SITE_OTHER): Payer: Medicare Other | Admitting: Physician Assistant

## 2013-08-03 VITALS — BP 104/64 | HR 43 | Temp 98.9°F | Resp 16 | Ht 64.25 in | Wt 165.8 lb

## 2013-08-03 DIAGNOSIS — Z23 Encounter for immunization: Secondary | ICD-10-CM

## 2013-08-03 MED ORDER — ZOSTER VACCINE LIVE 19400 UNT/0.65ML ~~LOC~~ SOLR: 0.6500 mL | Freq: Once | SUBCUTANEOUS | Status: DC

## 2013-08-03 MED ORDER — PNEUMOCOCCAL VAC POLYVALENT 25 MCG/0.5ML IJ INJ: 0.5000 mL | INJECTION | INTRAMUSCULAR | Status: AC

## 2013-08-03 NOTE — Progress Notes (Signed)
**Note Daisy-Identified via Obfuscation**   Subjective:    Patient ID: Daisy Nelson, female    DOB: [birth year], 66 y.o.   MRN: [id number]  HPI  This 67 y.o. female presents for vaccinations.  Her My Chart account tells her she's overdue for influenza, pneumococcal and shingles vaccines.  Since her last visit with me in 04/2013 she had follow-up with cardiology re: AFib in 01/2013.  She is to stop Xarelto when she exhausts her current supply and maintain anticoagulation on ASA 325 mg daily.  She expects to transition next week.  She is currently unemployed.  Has a beach trip planned next week, and then will pursue part-time employment.  Medications, allergies, past medical history, surgical history, family history, social history and problem list reviewed.  Review of Systems No chest pain.  No N/V.    Objective:   Physical Exam Blood pressure 104/64, pulse 43, temperature 98.9 F (37.2 C), temperature source Oral, resp. rate 16, height 5' 4.25" (1.632 m), weight 165 lb 12.8 oz (75.206 kg), SpO2 97.00%. Body mass index is 28.24 kg/(m^2). Well-developed, well nourished WF who is awake, alert and oriented, in NAD. HEENT: [place]/AT, sclera and conjunctiva are clear.   Heart: RRR Lungs: normal effort Extremities: no cyanosis, clubbing or edema. Skin: warm and dry without rash. Psychologic: good mood and appropriate affect, normal speech and behavior.        Assessment & Plan:  Need for prophylactic vaccination against Streptococcus pneumoniae (pneumococcus) - Plan: Flu Vaccine QUAD 36+ mos IM  Need for prophylactic vaccination and inoculation against influenza - Plan: pneumococcal 23 valent vaccine (PNU-IMMUNE) injection 0.5 mL  Need for shingles vaccine - Plan: zoster vaccine live, PF, (ZOSTAVAX) 36644 UNT/0.65ML injection   RTC 10/2013 for re-evaluation of chronic health issues and fasting labs.  Fernande Bras, PA-C Physician Assistant-Certified [facility]

## 2013-08-08 ENCOUNTER — Encounter: Payer: Self-pay | Admitting: Physician Assistant

## 2013-11-14 ENCOUNTER — Ambulatory Visit (INDEPENDENT_AMBULATORY_CARE_PROVIDER_SITE_OTHER): Payer: Medicare Other | Admitting: Physician Assistant

## 2013-11-14 ENCOUNTER — Encounter: Payer: Self-pay | Admitting: Physician Assistant

## 2013-11-14 VITALS — BP 120/64 | HR 47 | Temp 97.9°F | Resp 16 | Ht 64.5 in | Wt 167.0 lb

## 2013-11-14 DIAGNOSIS — E785 Hyperlipidemia, unspecified: Secondary | ICD-10-CM

## 2013-11-14 DIAGNOSIS — R739 Hyperglycemia, unspecified: Secondary | ICD-10-CM

## 2013-11-14 DIAGNOSIS — R7309 Other abnormal glucose: Secondary | ICD-10-CM

## 2013-11-14 LAB — POCT GLYCOSYLATED HEMOGLOBIN (HGB A1C): Hemoglobin A1C: 6.1

## 2013-11-14 LAB — GLUCOSE, POCT (MANUAL RESULT ENTRY): POC Glucose: 118 mg/dl — AB (ref 70–99)

## 2013-11-14 NOTE — Progress Notes (Signed)
   Subjective:    Patient ID: Daisy Nelson, female    DOB: 04/01/1946, 67 y.o.   MRN: [id number]  PCP: Kiora Hallberg, PA-C  Chief Complaint  Patient presents with  . Blood work    cholestrol   Medications, allergies, past medical history, surgical history, family history, social history and problem list reviewed and updated.  HPI  Has weaned and discontinued HRT.  Few hot flashes, but tolerating them well. Has d/c'd Xarelto, and replaced with ASA. Follows up with cardiology (Dr. Rennis Golden) in 12/2013. Occasional "flutter" in the chest.  Brief/momentary.  No associated with SOB, dizziness.  Review of Systems Occasional stress incontinence. ROS is otherwise negative for chest pain, SOB, HA, dizziness, vision change, N/V, diarrhea, constipation, dysuria, urinary urgency or frequency, myalgias, arthralgias or rash.     Objective:   Physical Exam  Blood pressure 120/64, pulse 47, temperature 97.9 F (36.6 C), temperature source Oral, resp. rate 16, height 5' 4.5" (1.638 m), weight 167 lb (75.751 kg), SpO2 95.00%. Body mass index is 28.23 kg/(m^2). Well-developed, well nourished WF who is awake, alert and oriented, in NAD. HEENT: [place]/AT, sclera and conjunctiva are clear.   Neck: supple, non-tender, no lymphadenopathy, thyromegaly. Heart: RRR, no murmur Lungs: normal effort, CTA Extremities: no cyanosis, clubbing or edema. Skin: warm and dry without rash. Psychologic: good mood and appropriate affect, normal speech and behavior.       Assessment & Plan:  1. Hyperlipidemia Await labs.  Continue pravastatin 40, OTC fish oil and healthy lifestyle modifications - Comprehensive metabolic panel - Lipid panel  2. Hyperglycemia Await labs. Continue healthy lifestyle modifications. - POCT glucose (manual entry)  She has a prescription for Zostavax but hasn't filled it yet.  Reminded that I'd like her to receive this vaccine.  Fernande Bras, PA-C Physician  Assistant-Certified [facility]

## 2013-11-14 NOTE — Addendum Note (Signed)
Addended by: Mervin Kung on: 11/14/2013 08:34 AM   Modules accepted: Orders

## 2013-11-15 LAB — COMPREHENSIVE METABOLIC PANEL
ALT: 16 U/L (ref 0–35)
AST: 16 U/L (ref 0–37)
Albumin: 3.9 g/dL (ref 3.5–5.2)
Alkaline Phosphatase: 80 U/L (ref 39–117)
BUN: 14 mg/dL (ref 6–23)
CO2: 26 mEq/L (ref 19–32)
Calcium: 9.2 mg/dL (ref 8.4–10.5)
Chloride: 105 mEq/L (ref 96–112)
Creat: 0.79 mg/dL (ref 0.50–1.10)
Glucose, Bld: 120 mg/dL — ABNORMAL HIGH (ref 70–99)
Potassium: 4.3 mEq/L (ref 3.5–5.3)
Sodium: 141 mEq/L (ref 135–145)
Total Bilirubin: 0.4 mg/dL (ref 0.3–1.2)
Total Protein: 6.6 g/dL (ref 6.0–8.3)

## 2013-11-15 LAB — LIPID PANEL
Cholesterol: 167 mg/dL (ref 0–200)
HDL: 48 mg/dL (ref >39)
LDL Cholesterol: 93 mg/dL (ref 0–99)
Total CHOL/HDL Ratio: 3.5 Ratio
Triglycerides: 131 mg/dL (ref <150)
VLDL: 26 mg/dL (ref 0–40)

## 2013-11-24 ENCOUNTER — Other Ambulatory Visit: Payer: Self-pay | Admitting: Physician Assistant

## 2013-11-27 ENCOUNTER — Encounter: Payer: Self-pay | Admitting: Physician Assistant

## 2013-11-27 NOTE — Telephone Encounter (Signed)
Noted, in her immunization history

## 2013-12-12 ENCOUNTER — Encounter: Payer: Self-pay | Admitting: Physician Assistant

## 2013-12-29 ENCOUNTER — Other Ambulatory Visit: Payer: Self-pay | Admitting: Physician Assistant

## 2013-12-30 ENCOUNTER — Encounter: Payer: Self-pay | Admitting: Physician Assistant

## 2014-01-01 MED ORDER — SERTRALINE HCL 25 MG PO TABS: 25.0000 mg | ORAL_TABLET | Freq: Every day | ORAL | Status: DC

## 2014-01-01 NOTE — Telephone Encounter (Signed)
Per my last note, pt. Advised to follow-up in 4 months.  I've submitted another refill so that she won't run out before her follow-up in 02/2014.  I've notified her by My Chart.

## 2014-01-08 ENCOUNTER — Encounter: Payer: Self-pay | Admitting: *Deleted

## 2014-01-10 ENCOUNTER — Ambulatory Visit (INDEPENDENT_AMBULATORY_CARE_PROVIDER_SITE_OTHER): Payer: Medicare Other | Admitting: Internal Medicine

## 2014-01-10 ENCOUNTER — Encounter: Payer: Self-pay | Admitting: Internal Medicine

## 2014-01-10 VITALS — BP 140/60 | HR 57 | Ht 65.0 in | Wt 167.0 lb

## 2014-01-10 DIAGNOSIS — I341 Nonrheumatic mitral (valve) prolapse: Secondary | ICD-10-CM

## 2014-01-10 DIAGNOSIS — I4891 Unspecified atrial fibrillation: Secondary | ICD-10-CM

## 2014-01-10 DIAGNOSIS — I480 Paroxysmal atrial fibrillation: Secondary | ICD-10-CM

## 2014-01-10 DIAGNOSIS — I059 Rheumatic mitral valve disease, unspecified: Secondary | ICD-10-CM

## 2014-01-10 NOTE — Progress Notes (Signed)
OFFICE NOTE  Chief Complaint:  Palpitations  Primary Care Physician: Wynne Dust  ID:  Daisy Nelson, DOB 11-13-1946, MRN [id number]  PCP:  JEFFERY,CHELLE, PA-C  Primary Cardiologist:  Harlyn Italiano     History of Present Illness: Daisy Nelson is a 68 y.o. female no prior history of CAD or AF, presented to the ER 3/10 around 3am with complaints of jaw pain, palpitations, and SOB. Her symptoms woke her from sleep around 12:30am. In the ER she was noted to be in Rapid AF. Her EKG shows diffuse ST depression. Initial Troponin is negative. Her symptoms improved after Diltiazem IV was started. She denied any prior history of similar symptoms in the past. She did take Mucinex D earlier this week for sinus infection.   The patient was admitted to [facility] and started on diltiazem, heparin, ASA, beta blocker and nitrates. She spontaneously converted to NSR and ruled out for MI. Lexiscan myovew revealed normal LVF, no ischemia and fixed defects involving the apical segments of the inferior septum and inferior lateral walls. 2D echo confirmed normal EF of 55-60%. Mild TR. CHADSVSC= 1. She was started on Xarelto.  Lopressor was continued.   She was last seen by Tarri Fuller, PA-C, as she was about to have cataract surgery.  It went very well for her and she can now see clearly.  She reports only an accessional fluttering in her chest.  I did decrease her lopressor to 12.5 mg bid during the last visit because she was having a HR in the 40's.  She otherwise denies  nausea, vomiting, fever, chest pain, shortness of breath, orthopnea, dizziness, PND, cough, congestion, abdominal pain, hematochezia, melena, lower extremity edema.  Her only complaints today are very infrequent palpitations.  Wt Readings from Last 3 Encounters:  01/10/14 167 lb (75.751 kg)  11/14/13 167 lb (75.751 kg)  08/03/13 165 lb 12.8 oz (75.206 kg)     Past Medical History  Diagnosis Date  . GERD (gastroesophageal  reflux disease)   . Allergy   . Hyperlipidemia   . Depression   . Anxiety   . Mitral valve prolapse   . DJD (degenerative joint disease) of cervical spine   . Nephrolithiasis   . Hyperglycemia   . Colon polyps   . AF (atrial fibrillation)     AF with RVR 01/2013  . Family history of CABG     Current Outpatient Prescriptions  Medication Sig Dispense Refill  . aspirin 325 MG tablet Take 325 mg by mouth daily.      . fexofenadine (ALLEGRA) 180 MG tablet Take 180 mg by mouth daily.      . fish oil-omega-3 fatty acids 1000 MG capsule Take 1 g by mouth 3 (three) times daily.       . fluticasone (FLONASE) 50 MCG/ACT nasal spray USE 2 SPRAYS IN EACH NOSTRIL ONCE DAILY  16 g  12  . metoprolol tartrate (LOPRESSOR) 25 MG tablet Take 12.5 mg by mouth 2 (two) times daily.      . pravastatin (PRAVACHOL) 40 MG tablet TAKE 1 TABLET BY MOUTH ONCE DAILY  90 tablet  3  . ranitidine (ZANTAC) 150 MG tablet Take 150 mg by mouth daily with supper.       . sertraline (ZOLOFT) 25 MG tablet Take 1 tablet (25 mg total) by mouth daily.  90 tablet  1  . zoster vaccine live, PF, (ZOSTAVAX) 99371 UNT/0.65ML injection Inject 19,400 Units into the skin once.  0.65 mL  0   No current facility-administered medications for this visit.    Allergies:    Allergies  Allergen Reactions  . Adhesive [Tape]     Burn skin  . Erythromycin     Gi intolerance  . Naproxen Nausea And Vomiting    Abdominal pain   . Penicillins Hives    Social History:  The patient  reports that she quit smoking about 9 years ago. Her smoking use included Cigarettes. She started smoking about 50 years ago. She has a 18 pack-year smoking history. She has never used smokeless tobacco. She reports that she does not drink alcohol or use illicit drugs.   Family history:   Family History  Problem Relation Age of Onset  . Stroke Mother   . Diabetes Father   . Heart disease Father     CABG  . Arthritis Father     C6-7  . Skin cancer Father      SCC  . Arthritis Brother     s/p THR    ROS: A comprehensive review of systems was negative except for: Cardiovascular: positive for palpitations  HOME MEDS: Current Outpatient Prescriptions  Medication Sig Dispense Refill  . aspirin 325 MG tablet Take 325 mg by mouth daily.      . fexofenadine (ALLEGRA) 180 MG tablet Take 180 mg by mouth daily.      . fish oil-omega-3 fatty acids 1000 MG capsule Take 1 g by mouth 3 (three) times daily.       . fluticasone (FLONASE) 50 MCG/ACT nasal spray USE 2 SPRAYS IN EACH NOSTRIL ONCE DAILY  16 g  12  . metoprolol tartrate (LOPRESSOR) 25 MG tablet Take 12.5 mg by mouth 2 (two) times daily.      . pravastatin (PRAVACHOL) 40 MG tablet TAKE 1 TABLET BY MOUTH ONCE DAILY  90 tablet  3  . ranitidine (ZANTAC) 150 MG tablet Take 150 mg by mouth daily with supper.       . sertraline (ZOLOFT) 25 MG tablet Take 1 tablet (25 mg total) by mouth daily.  90 tablet  1  . zoster vaccine live, PF, (ZOSTAVAX) 40973 UNT/0.65ML injection Inject 19,400 Units into the skin once.  0.65 mL  0   No current facility-administered medications for this visit.    LABS/IMAGING: No results found for this or any previous visit (from the past 48 hour(s)). No results found.  VITALS: BP 140/60  Pulse 57  Ht 5\' 5"  (1.651 m)  Wt 167 lb (75.751 kg)  BMI 27.79 kg/m2  EXAM: General appearance: alert and no distress Neck: no carotid bruit and no JVD Lungs: clear to auscultation bilaterally Heart: regular rate and rhythm, S1, S2 normal, no murmur, click, rub or gallop Abdomen: soft, non-tender; bowel sounds normal; no masses,  no organomegaly Extremities: extremities normal, atraumatic, no cyanosis or edema Pulses: 2+ and symmetric Skin: Skin color, texture, turgor normal. No rashes or lesions Neurologic: Grossly normal Psych: Mood, affect normal  EKG: Sinus bradycardia 57, nonspecific T wave changes  ASSESSMENT: 1. Paroxysmal atrial  fibrillation 2. Dyslipidemia  PLAN: 1.   Daisy Nelson continues to have brief, infrequent palpitations which have improved on beta blocker. She took Xarelto for several months, but secondary to a low CHADS2VASC score of 0.5-1, it was recommended that she go on aspirin for prevention of stroke. I think this continues to hold true although she does have intermittent palpitations. She is not in atrial fibrillation today. I've asked her to continue to  follow her palpitations if she has recurrence of A. fib given her age now of 66 and being female, we are to consider possibly putting her on a NOAC (again if she has recurrent documented a-fib).  She is not particularly symptomatic other than knowing that she has palpitations, therefore antiarrhythmic therapy is not indicated.  Plan to see her back annually or sooner if she has more problems with her AF.  Pixie Casino, MD, [facility] Attending Cardiologist [facility]  Kynzlee Hucker C 01/10/2014, 5:28 PM

## 2014-01-10 NOTE — Patient Instructions (Signed)
Continue your current medication.  Dr Debara Pickett wants you to follow-up in 1 year. You will receive a reminder letter in the mail two months in advance. If you don't receive a letter, please call our office to schedule the follow-up appointment.

## 2014-03-14 ENCOUNTER — Other Ambulatory Visit: Payer: Self-pay | Admitting: *Deleted

## 2014-03-14 MED ORDER — METOPROLOL TARTRATE 25 MG PO TABS: 12.5000 mg | ORAL_TABLET | Freq: Two times a day (BID) | ORAL | Status: DC

## 2014-03-14 NOTE — Telephone Encounter (Signed)
Rx refill sent to patients pharmacy  

## 2014-03-20 ENCOUNTER — Ambulatory Visit (INDEPENDENT_AMBULATORY_CARE_PROVIDER_SITE_OTHER): Payer: Medicare Other | Admitting: Physician Assistant

## 2014-03-20 ENCOUNTER — Encounter: Payer: Self-pay | Admitting: Physician Assistant

## 2014-03-20 VITALS — BP 128/78 | HR 49 | Temp 98.0°F | Resp 16 | Ht 64.5 in | Wt 168.4 lb

## 2014-03-20 DIAGNOSIS — R739 Hyperglycemia, unspecified: Secondary | ICD-10-CM

## 2014-03-20 DIAGNOSIS — I4891 Unspecified atrial fibrillation: Secondary | ICD-10-CM

## 2014-03-20 DIAGNOSIS — R7309 Other abnormal glucose: Secondary | ICD-10-CM

## 2014-03-20 DIAGNOSIS — E785 Hyperlipidemia, unspecified: Secondary | ICD-10-CM

## 2014-03-20 LAB — POCT GLYCOSYLATED HEMOGLOBIN (HGB A1C): Hemoglobin A1C: 6

## 2014-03-20 LAB — GLUCOSE, POCT (MANUAL RESULT ENTRY): POC Glucose: 116 mg/dl — AB (ref 70–99)

## 2014-03-20 MED ORDER — METOPROLOL TARTRATE 25 MG PO TABS: 12.5000 mg | ORAL_TABLET | Freq: Two times a day (BID) | ORAL | Status: DC

## 2014-03-20 NOTE — Progress Notes (Signed)
   Subjective:    Patient ID: Daisy Nelson, female    DOB: 1946/06/08, 68 y.o.   MRN: [id number]  Diabetes Pertinent negatives for diabetes include no fatigue.  Hyperlipidemia    68y.o female pt with hx of GERD, Hyperglycemia, Hyperlipidemia, paroxsymal A fib presents to clinic to check blood glucose and lipid panel.  Lipid panel 11/14/2013 showed all normal values.  Glucose at that time was 118.  Pt still taking 40mg  pravastatin daily and   Pt states she saw her Cardiologist for her paroxysmal A fib in February.  She will follow up in 1 year or sooner if needed.  She is still taking 12.5mg  of metoprolol twice daily and 325mg  aspirin daily.  She has not had an episode of chest fluttering since February visit.   Pt states that she received her Zostavax vaccine since 10/2013 office visit.   Review of Systems  Constitutional: Negative for fever, chills, diaphoresis and fatigue.  HENT: Negative.   Respiratory: Negative.   Cardiovascular: Negative.   Gastrointestinal: Negative.   Endocrine: Negative.   Genitourinary: Negative.   Musculoskeletal: Negative.   Allergic/Immunologic: Positive for environmental allergies.  Neurological: Negative.   Hematological: Negative.        Objective:   Physical Exam  Constitutional: She is oriented to person, place, and time. She appears well-developed and well-nourished. No distress.  BP 128/78  Pulse 49  Temp(Src) 98 F (36.7 C) (Oral)  Resp 16  Ht 5' 4.5" (1.638 m)  Wt 168 lb 6.4 oz (76.386 kg)  BMI 28.47 kg/m2  SpO2 97%   HENT:  Head: Normocephalic.  Eyes: Conjunctivae are normal. Pupils are equal, round, and reactive to light.  Cardiovascular: Normal rate, regular rhythm, normal heart sounds and intact distal pulses.  Exam reveals no gallop and no friction rub.   No murmur heard. Pulmonary/Chest: Effort normal and breath sounds normal. No respiratory distress. She has no wheezes.  Lymphadenopathy:    She has no cervical  adenopathy.  Neurological: She is alert and oriented to person, place, and time.  Skin: Skin is warm and dry. No rash noted.  Psychiatric: She has a normal mood and affect. Her behavior is normal.   Results for orders placed in visit on 03/20/14  GLUCOSE, POCT (MANUAL RESULT ENTRY)      Result Value Ref Range   POC Glucose 116 (*) 70 - 99 mg/dl  POCT GLYCOSYLATED HEMOGLOBIN (HGB A1C)      Result Value Ref Range   Hemoglobin A1C 6.0            Assessment & Plan:  1. Hyperglycemia Glucose similar to last check in 10/2013.  Will continue to monitor. - POCT glucose (manual entry) - POCT glycosylated hemoglobin (Hb A1C) - HM Diabetes Foot Exam  2. Hyperlipidemia Will repeat lipid panel in 6 months at office visit for CPE.  3. Atrial fibrillation with rapid ventricular response Will continue with current treatment plan.  Will follow up with Cardiologist in 1 year - metoprolol tartrate (LOPRESSOR) 25 MG tablet; Take 0.5 tablets (12.5 mg total) by mouth 2 (two) times daily.  Dispense: 90 tablet; Refill: 3

## 2014-03-20 NOTE — Patient Instructions (Signed)
Keep up the great work!

## 2014-03-20 NOTE — Progress Notes (Signed)
I have examined this patient along with the student and agree.  

## 2014-05-17 ENCOUNTER — Other Ambulatory Visit: Payer: Self-pay

## 2014-05-17 DIAGNOSIS — Z1231 Encounter for screening mammogram for malignant neoplasm of breast: Secondary | ICD-10-CM

## 2014-05-22 ENCOUNTER — Other Ambulatory Visit: Payer: Self-pay | Admitting: Physician Assistant

## 2014-05-23 NOTE — Telephone Encounter (Signed)
Don't see this med on current med list at 02/2014 OV. Called pt to get details. She reports that she has tried being off of it for 7-8 mos, but is continuing to have night sweats, hot flashes, and now has started having "up and down mood swings" which she isn't sure is related or not. Pt would like to try going back on Premarin, or something else if Chelle would like her to try something different, to see if these Sxs improve. Chelle, please advise.

## 2014-05-24 ENCOUNTER — Telehealth: Payer: Self-pay

## 2014-05-24 NOTE — Telephone Encounter (Signed)
PA needed for premarin. Completed form and faxed in.

## 2014-05-29 NOTE — Telephone Encounter (Signed)
PA approved through 05/25/15. Notified pharm.

## 2014-06-13 ENCOUNTER — Encounter: Payer: Self-pay | Admitting: Physician Assistant

## 2014-06-13 MED ORDER — ESTRADIOL 1 MG PO TABS: 1.0000 mg | ORAL_TABLET | Freq: Every day | ORAL | Status: DC

## 2014-06-13 NOTE — Telephone Encounter (Signed)
OK to switch patient from Premarin to generic estradiol. Order pended, waiting for her pharmacy choice (a [facility]).

## 2014-06-13 NOTE — Telephone Encounter (Signed)
Meds ordered this encounter  Medications  . estradiol (ESTRACE) 1 MG tablet    Sig: Take 1 tablet (1 mg total) by mouth daily.    Dispense:  90 tablet    Refill:  3    OK to dispense #30, RF x 12 if patient prefers.    Order Specific Question:  Supervising Provider    Answer:  Leandrew Koyanagi [[id number]]    Sent to [facility] on [address] per patient request. Patient notified via My Chart.

## 2014-06-18 ENCOUNTER — Ambulatory Visit
Admission: RE | Admit: 2014-06-18 | Discharge: 2014-06-18 | Disposition: A | Discharge status: Home / Self Care | Payer: Medicare Other | Source: Ambulatory Visit

## 2014-06-18 DIAGNOSIS — Z1231 Encounter for screening mammogram for malignant neoplasm of breast: Secondary | ICD-10-CM

## 2014-06-26 ENCOUNTER — Ambulatory Visit (INDEPENDENT_AMBULATORY_CARE_PROVIDER_SITE_OTHER): Payer: Medicare Other

## 2014-06-26 ENCOUNTER — Ambulatory Visit (INDEPENDENT_AMBULATORY_CARE_PROVIDER_SITE_OTHER): Payer: Medicare Other | Admitting: Emergency Medicine

## 2014-06-26 VITALS — BP 116/62 | HR 58 | Temp 98.2°F | Resp 16 | Ht 65.0 in | Wt 171.2 lb

## 2014-06-26 DIAGNOSIS — M2390 Unspecified internal derangement of unspecified knee: Secondary | ICD-10-CM

## 2014-06-26 NOTE — Progress Notes (Signed)
[facility] [address], [place]  [address] [phone]- [phone]  Date:  06/26/2014   Name:  Daisy Nelson   DOB:  [birth year]   MRN:  [id number]  PCP:  Analina Filla,CHELLE, PA-C    Chief Complaint: Knee Injury   History of Present Illness:  Daisy Nelson is a 68 y.o. very pleasant female patient who presents with the following:  Injured her right knee while descending stairs last night.  Twisted and had immediate pain.  Says since her knee is clicking and feels as though it may give way.  No history of prior injury.  No improvement with over the counter medications or other home remedies. Denies other complaint or health concern today.   Patient Active Problem List   Diagnosis Date Noted  . PAF (paroxysmal atrial fibrillation) 07/10/2013  . Unstable angina 02/06/2013  . Atrial fibrillation with rapid ventricular response 02/06/2013  . Family history of coronary artery bypass surgery 02/06/2013  . GERD (gastroesophageal reflux disease) 10/25/2012  . Allergy 10/25/2012  . Hyperlipidemia 10/25/2012  . Depression 10/25/2012  . Anxiety 10/25/2012  . MVP (mitral valve prolapse) 10/25/2012  . DJD (degenerative joint disease) of cervical spine 10/25/2012  . Nephrolithiasis 10/25/2012  . Colon polyps 10/25/2012    Past Medical History  Diagnosis Date  . GERD (gastroesophageal reflux disease)   . Allergy   . Hyperlipidemia   . Depression   . Anxiety   . DJD (degenerative joint disease) of cervical spine   . Nephrolithiasis   . Hyperglycemia   . Colon polyps   . AF (atrial fibrillation)     AF with RVR 01/2013  . Family history of CABG     Past Surgical History  Procedure Laterality Date  . Tubal ligation  1977  . Cesarean section  1976  . Abdominal hysterectomy  1991  . Laparoscopy  1976  . Ganglion cyst excision      Left hand  . Cataract extraction, bilateral Bilateral 03/2013  . Transthoracic echocardiogram  01/2013    EF 55-60%, mod conc LVH;  mild MR with mildly thickened leaflets; mild TR; PA peak pressure 48mmHg  . Nm myocar perf wall motion  01/2013    lexiscan - no pharmacologically induced ischemia, fixed defects in apical segments of anterior septum & inferior lateral wall     History  Substance Use Topics  . Smoking status: Former Smoker -- 0.50 packs/day for 36 years    Types: Cigarettes    Start date: 03/15/1963    Quit date: 01/08/2005  . Smokeless tobacco: Never Used  . Alcohol Use: No    Family History  Problem Relation Age of Onset  . Stroke Mother   . Diabetes Father   . Heart disease Father     CABG  . Arthritis Father     C6-7  . Skin cancer Father     SCC  . Arthritis Brother     s/p THR    Allergies  Allergen Reactions  . Adhesive [Tape]     Burn skin  . Erythromycin     Gi intolerance  . Naproxen Nausea And Vomiting    Abdominal pain   . Penicillins Hives    Medication list has been reviewed and updated.  Current Outpatient Prescriptions on File Prior to Visit  Medication Sig Dispense Refill  . aspirin 325 MG tablet Take 325 mg by mouth daily.      Marland Kitchen estradiol (ESTRACE) 1 MG tablet Take  1 tablet (1 mg total) by mouth daily.  90 tablet  3  . fexofenadine (ALLEGRA) 180 MG tablet Take 180 mg by mouth daily.      . fish oil-omega-3 fatty acids 1000 MG capsule Take 1 g by mouth 3 (three) times daily.       . fluticasone (FLONASE) 50 MCG/ACT nasal spray USE 2 SPRAYS IN EACH NOSTRIL ONCE DAILY  16 g  12  . metoprolol tartrate (LOPRESSOR) 25 MG tablet Take 0.5 tablets (12.5 mg total) by mouth 2 (two) times daily.  90 tablet  3  . pravastatin (PRAVACHOL) 40 MG tablet TAKE 1 TABLET BY MOUTH ONCE DAILY  90 tablet  3  . ranitidine (ZANTAC) 150 MG tablet Take 150 mg by mouth daily with supper.       . sertraline (ZOLOFT) 25 MG tablet Take 1 tablet (25 mg total) by mouth daily.  90 tablet  1  . PREMARIN 0.3 MG tablet TAKE 1 TABLET BY MOUTH ONCE DAILY AS DIRECTED  90 tablet  1   No current  facility-administered medications on file prior to visit.    Review of Systems:  As per HPI, otherwise negative.    Physical Examination: Filed Vitals:   06/26/14 1427  BP: 116/62  Pulse: 58  Temp: 98.2 F (36.8 C)  Resp: 16   Filed Vitals:   06/26/14 1427  Height: 5\' 5"  (1.651 m)  Weight: 171 lb 3.2 oz (77.656 kg)   Body mass index is 28.49 kg/(m^2). Ideal Body Weight: Weight in (lb) to have BMI = 25: 149.9   GEN: WDWN, NAD, Non-toxic, Alert & Oriented x 3 HEENT: Atraumatic, Normocephalic.  Ears and Nose: No external deformity. EXTR: No clubbing/cyanosis/edema NEURO: Normal gait.  PSYCH: Normally interactive. Conversant. Not depressed or anxious appearing.  Calm demeanor.  Right knee;  No effusion.  Tender globally.  Full extension and flexion.  Joint stable  Assessment and Plan: Internal derangement knee Anaprox  Signed,  Ellison Carwin, MD   [facility] reading (PRIMARY) by  Dr. Ouida Sills.  No osseous finding.

## 2014-06-26 NOTE — Patient Instructions (Signed)

## 2014-07-27 ENCOUNTER — Encounter: Payer: Self-pay | Admitting: Physician Assistant

## 2014-07-27 ENCOUNTER — Other Ambulatory Visit: Payer: Self-pay | Admitting: Physician Assistant

## 2014-07-31 NOTE — Telephone Encounter (Signed)
Rx sent and pt notified by MyChart.

## 2014-08-04 ENCOUNTER — Other Ambulatory Visit: Payer: Self-pay | Admitting: Physician Assistant

## 2014-09-25 ENCOUNTER — Encounter: Payer: Medicare Other | Admitting: Physician Assistant

## 2014-09-28 ENCOUNTER — Encounter: Payer: Self-pay | Admitting: Family Medicine

## 2014-09-28 ENCOUNTER — Ambulatory Visit (INDEPENDENT_AMBULATORY_CARE_PROVIDER_SITE_OTHER): Payer: Medicare Other | Admitting: Family Medicine

## 2014-09-28 VITALS — BP 110/62 | HR 48 | Temp 97.7°F | Resp 16 | Ht 64.5 in | Wt 169.6 lb

## 2014-09-28 DIAGNOSIS — J302 Other seasonal allergic rhinitis: Secondary | ICD-10-CM

## 2014-09-28 DIAGNOSIS — E785 Hyperlipidemia, unspecified: Secondary | ICD-10-CM

## 2014-09-28 DIAGNOSIS — F411 Generalized anxiety disorder: Secondary | ICD-10-CM

## 2014-09-28 DIAGNOSIS — Z23 Encounter for immunization: Secondary | ICD-10-CM

## 2014-09-28 DIAGNOSIS — E2839 Other primary ovarian failure: Secondary | ICD-10-CM

## 2014-09-28 DIAGNOSIS — Z8679 Personal history of other diseases of the circulatory system: Secondary | ICD-10-CM

## 2014-09-28 DIAGNOSIS — R739 Hyperglycemia, unspecified: Secondary | ICD-10-CM

## 2014-09-28 DIAGNOSIS — Z0000 Encounter for general adult medical examination without abnormal findings: Secondary | ICD-10-CM

## 2014-09-28 LAB — LIPID PANEL
Cholesterol: 162 mg/dL (ref 0–200)
HDL: 45 mg/dL (ref >39)
LDL Cholesterol: 84 mg/dL (ref 0–99)
Total CHOL/HDL Ratio: 3.6 Ratio
Triglycerides: 163 mg/dL — ABNORMAL HIGH (ref <150)
VLDL: 33 mg/dL (ref 0–40)

## 2014-09-28 LAB — CBC
HCT: 40.7 % (ref 36.0–46.0)
Hemoglobin: 14.1 g/dL (ref 12.0–15.0)
MCH: 30.5 pg (ref 26.0–34.0)
MCHC: 34.6 g/dL (ref 30.0–36.0)
MCV: 87.9 fL (ref 78.0–100.0)
Platelets: 175 10*3/uL (ref 150–400)
RBC: 4.63 MIL/uL (ref 3.87–5.11)
RDW: 13.7 % (ref 11.5–15.5)
WBC: 3.8 10*3/uL — ABNORMAL LOW (ref 4.0–10.5)

## 2014-09-28 LAB — COMPLETE METABOLIC PANEL WITH GFR
ALT: 17 U/L (ref 0–35)
AST: 17 U/L (ref 0–37)
Albumin: 3.9 g/dL (ref 3.5–5.2)
Alkaline Phosphatase: 80 U/L (ref 39–117)
BUN: 12 mg/dL (ref 6–23)
CO2: 27 mEq/L (ref 19–32)
Calcium: 9 mg/dL (ref 8.4–10.5)
Chloride: 104 mEq/L (ref 96–112)
Creat: 0.7 mg/dL (ref 0.50–1.10)
GFR, Est African American: >89 mL/min
GFR, Est Non African American: >89 mL/min
Glucose, Bld: 96 mg/dL (ref 70–99)
Potassium: 4.5 mEq/L (ref 3.5–5.3)
Sodium: 139 mEq/L (ref 135–145)
Total Bilirubin: 0.4 mg/dL (ref 0.2–1.2)
Total Protein: 6.6 g/dL (ref 6.0–8.3)

## 2014-09-28 LAB — POCT GLYCOSYLATED HEMOGLOBIN (HGB A1C): Hemoglobin A1C: 5.7

## 2014-09-28 LAB — TSH: TSH: 1.321 u[IU]/mL (ref 0.350–4.500)

## 2014-09-28 MED ORDER — PRAVASTATIN SODIUM 40 MG PO TABS: ORAL_TABLET | ORAL | Status: DC

## 2014-09-28 MED ORDER — SERTRALINE HCL 25 MG PO TABS: 25.0000 mg | ORAL_TABLET | Freq: Every day | ORAL | Status: DC

## 2014-09-28 MED ORDER — ESTRADIOL 1 MG PO TABS: 1.0000 mg | ORAL_TABLET | Freq: Every day | ORAL | Status: DC

## 2014-09-28 MED ORDER — FLUTICASONE PROPIONATE 50 MCG/ACT NA SUSP: NASAL | Status: DC

## 2014-09-28 MED ORDER — PNEUMOCOCCAL 13-VAL CONJ VACC IM SUSP: 0.5000 mL | Freq: Once | INTRAMUSCULAR | Status: DC

## 2014-09-28 NOTE — Progress Notes (Signed)
Chief Complaint:  Chief Complaint  Patient presents with  . Annual Exam    HPI: Daisy Nelson is a 68 y.o. female who is here for  An Annual exam Last annual was 2014 Last pap on record was 2007 from ob/gyn at [facility], she ahs ahda hysterectomy a long time ago due to benign fibroids, has had no abnormal paps since  She is on HRT for hot flashes She is UTD on her Pneumococal Vaccine 23 ( she got is in 07/2013) and also tetanus, shingles , bone density scan and mammogram and colonscopy Colonoscopy is in next 2-3 years due to polyp She has hyperglycemia, hyperlipidemia and also GAD and h/o atrial fib   Past Medical History  Diagnosis Date  . GERD (gastroesophageal reflux disease)   . Allergy   . Hyperlipidemia   . Depression   . Anxiety   . DJD (degenerative joint disease) of cervical spine   . Nephrolithiasis   . Hyperglycemia   . Colon polyps   . AF (atrial fibrillation)     AF with RVR 01/2013  . Family history of CABG    Past Surgical History  Procedure Laterality Date  . Tubal ligation  1977  . Cesarean section  1976  . Abdominal hysterectomy  1991  . Laparoscopy  1976  . Ganglion cyst excision      Left hand  . Cataract extraction, bilateral Bilateral 03/2013  . Transthoracic echocardiogram  01/2013    EF 55-60%, mod conc LVH; mild MR with mildly thickened leaflets; mild TR; PA peak pressure 63mmHg  . Nm myocar perf wall motion  01/2013    lexiscan - no pharmacologically induced ischemia, fixed defects in apical segments of anterior septum & inferior lateral wall    History   Social History  . Marital Status: Married    Spouse Name: Herbie Baltimore    Number of Children: 1  . Years of Education: 12   Occupational History  . insurance agent    Social History Main Topics  . Smoking status: Former Smoker -- 0.50 packs/day for 36 years    Types: Cigarettes    Start date: 03/15/1963    Quit date: 01/08/2005  . Smokeless tobacco: Never Used  . Alcohol  Use: No  . Drug Use: No  . Sexual Activity: Yes    Partners: Male    Birth Control/ Protection: Surgical   Other Topics Concern  . None   Social History Narrative   Lives with her husband and their cat, Crewe.   Family History  Problem Relation Age of Onset  . Stroke Mother   . Diabetes Father   . Heart disease Father     CABG  . Arthritis Father     C6-7  . Skin cancer Father     SCC  . Arthritis Brother     s/p THR   Allergies  Allergen Reactions  . Adhesive [Tape]     Burn skin  . Erythromycin     Gi intolerance  . Naproxen Nausea And Vomiting    Abdominal pain   . Penicillins Hives   Prior to Admission medications   Medication Sig Start Date End Date Taking? Authorizing Provider  aspirin 325 MG tablet Take 325 mg by mouth daily.   Yes Historical Provider, MD  estradiol (ESTRACE) 1 MG tablet Take 1 tablet (1 mg total) by mouth daily. 06/13/14  Yes Chelle S Jeffery, PA-C  fexofenadine (ALLEGRA) 180 MG tablet  Take 180 mg by mouth daily.   Yes Historical Provider, MD  fish oil-omega-3 fatty acids 1000 MG capsule Take 1 g by mouth 3 (three) times daily.    Yes Historical Provider, MD  fluticasone (FLONASE) 50 MCG/ACT nasal spray USE 2 SPRAYS IN EACH NOSTRIL ONCE DAILY 08/06/14  Yes Chelle S Jeffery, PA-C  metoprolol tartrate (LOPRESSOR) 25 MG tablet Take 0.5 tablets (12.5 mg total) by mouth 2 (two) times daily. 03/20/14  Yes Chelle S Jeffery, PA-C  pravastatin (PRAVACHOL) 40 MG tablet TAKE 1 TABLET BY MOUTH ONCE DAILY 11/24/13  Yes Chelle S Jeffery, PA-C  ranitidine (ZANTAC) 150 MG tablet Take 150 mg by mouth daily with supper.    Yes Historical Provider, MD  sertraline (ZOLOFT) 25 MG tablet Take 1 tablet (25 mg total) by mouth daily. PATIENT NEEDS OFFICE VISIT FOR ADDITIONAL REFILLS 07/31/14  Yes Chelle S Jeffery, PA-C  PREMARIN 0.3 MG tablet TAKE 1 TABLET BY MOUTH ONCE DAILY AS DIRECTED    Chelle S Jeffery, PA-C     ROS: The patient denies fevers, chills, night  sweats, unintentional weight loss, chest pain, palpitations, wheezing, dyspnea on exertion, nausea, vomiting, abdominal pain, dysuria, hematuria, melena, numbness, weakness, or tingling.   All other systems have been reviewed and were otherwise negative with the exception of those mentioned in the HPI and as above.    PHYSICAL EXAM: Filed Vitals:   09/28/14 0937  BP: 110/62  Pulse: 48  Temp: 97.7 F (36.5 C)  Resp: 16   Filed Vitals:   09/28/14 0937  Height: 5' 4.5" (1.638 m)  Weight: 169 lb 9.6 oz (76.93 kg)   Body mass index is 28.67 kg/(m^2).  General: Alert, no acute distress HEENT:  Normocephalic, atraumatic, oropharynx patent. EOMI, PERRLA, fundo exam normal, Tm nl Cardiovascular:  Regular rate and rhythm, no rubs murmurs or gallops.  No Carotid bruits, radial pulse intact. No pedal edema.  Respiratory: Clear to auscultation bilaterally.  No wheezes, rales, or rhonchi.  No cyanosis, no use of accessory musculature GI: No organomegaly, abdomen is soft and non-tender, positive bowel sounds.  No masses. Skin: No rashes. Neurologic: Facial musculature symmetric. Psychiatric: Patient is appropriate throughout our interaction. Lymphatic: No cervical lymphadenopathy Musculoskeletal: Gait intact. Recent mammogram in 05/2014 Defer Gu exam since no sxs and she is s/p benign hysterectomy   LABS: Results for orders placed in visit on 09/28/14  POCT GLYCOSYLATED HEMOGLOBIN (HGB A1C)      Result Value Ref Range   Hemoglobin A1C 5.7       EKG/XRAY:   Primary read interpreted by Dr. Marin Comment at [facility].   ASSESSMENT/PLAN: Encounter Diagnoses  Name Primary?  . Flu vaccine need   . Annual physical exam Yes  . Need for prophylactic vaccination against Streptococcus pneumoniae (pneumococcus)   . Hyperlipemia   . Generalized anxiety disorder   . Seasonal allergies   . History of atrial fibrillation   . Estrogen deficiency   . Hyperglycemia    Labs pending  UTD on colocnscopy,  tetanus, mammogram, shingles Today got flu vaccine and also prevnar 13, she should be caught up with pneumoccocal vaccine completely , last MNA 23 was 1 year ago so can do Prevnar 13 Refilled meds for 1 year F/u in 6 months  Gross sideeffects, risk and benefits, and alternatives of medications d/w patient. Patient is aware that all medications have potential sideeffects and we are unable to predict every sideeffect or drug-drug interaction that may occur.  Baylen Dea, [place],  DO 09/28/2014 1:32 PM

## 2014-11-13 ENCOUNTER — Ambulatory Visit (INDEPENDENT_AMBULATORY_CARE_PROVIDER_SITE_OTHER): Payer: Medicare Other | Admitting: Family Medicine

## 2014-11-13 VITALS — BP 124/64 | HR 65 | Temp 98.5°F | Resp 16 | Ht 64.0 in | Wt 169.4 lb

## 2014-11-13 DIAGNOSIS — R059 Cough, unspecified: Secondary | ICD-10-CM

## 2014-11-13 DIAGNOSIS — R05 Cough: Secondary | ICD-10-CM

## 2014-11-13 DIAGNOSIS — J0100 Acute maxillary sinusitis, unspecified: Secondary | ICD-10-CM

## 2014-11-13 MED ORDER — BENZONATATE 100 MG PO CAPS: 100.0000 mg | ORAL_CAPSULE | Freq: Three times a day (TID) | ORAL | Status: DC | PRN

## 2014-11-13 MED ORDER — AZITHROMYCIN 250 MG PO TABS: ORAL_TABLET | ORAL | Status: DC

## 2014-11-13 NOTE — Progress Notes (Addendum)
Subjective:    Patient ID: Daisy Nelson, female    DOB: 11/17/1946, 68 y.o.   MRN: [id number]  This chart was scribed for Daisy Ray, MD by Tula Nakayama, ED Scribe. This patient was seen at [facility] and the patient's care was started at 8:56 PM  HPI FLORETTA Nelson is a 68 y.o. female complaining of constant head congestion, facial pain and ear pressure that started yesterday. She notes cough, postnasal drip and yellow/green nasal discharge that started a few days ago as associated symptoms. Pt states symptoms started as sore throat and headache 6 days ago. Symptoms improved with Tylenol and throat drops, but became worse yesterday. Pt takes Allegra nightly and Flonase. She denies wheezing or shortness of breath as associated symptoms.  JEFFERY,CHELLE, PA-C  Patient Active Problem List   Diagnosis Date Noted  . PAF (paroxysmal atrial fibrillation) 07/10/2013  . Unstable angina 02/06/2013  . Atrial fibrillation with rapid ventricular response 02/06/2013  . Family history of coronary artery bypass surgery 02/06/2013  . GERD (gastroesophageal reflux disease) 10/25/2012  . Allergy 10/25/2012  . Hyperlipidemia 10/25/2012  . Depression 10/25/2012  . Anxiety 10/25/2012  . MVP (mitral valve prolapse) 10/25/2012  . DJD (degenerative joint disease) of cervical spine 10/25/2012  . Nephrolithiasis 10/25/2012  . Colon polyps 10/25/2012   Past Medical History  Diagnosis Date  . GERD (gastroesophageal reflux disease)   . Allergy   . Hyperlipidemia   . Depression   . Anxiety   . DJD (degenerative joint disease) of cervical spine   . Nephrolithiasis   . Hyperglycemia   . Colon polyps   . AF (atrial fibrillation)     AF with RVR 01/2013  . Family history of CABG    Past Surgical History  Procedure Laterality Date  . Tubal ligation  1977  . Cesarean section  1976  . Abdominal hysterectomy  1991  . Laparoscopy  1976  . Ganglion cyst excision      Left hand  . Cataract  extraction, bilateral Bilateral 03/2013  . Transthoracic echocardiogram  01/2013    EF 55-60%, mod conc LVH; mild MR with mildly thickened leaflets; mild TR; PA peak pressure 40mmHg  . Nm myocar perf wall motion  01/2013    lexiscan - no pharmacologically induced ischemia, fixed defects in apical segments of anterior septum & inferior lateral wall    Allergies  Allergen Reactions  . Adhesive [Tape]     Burn skin  . Erythromycin     Gi intolerance  . Naproxen Nausea And Vomiting    Abdominal pain   . Penicillins Hives   Prior to Admission medications   Medication Sig Start Date End Date Taking? Authorizing Provider  aspirin 325 MG tablet Take 325 mg by mouth daily.   Yes Historical Provider, MD  estradiol (ESTRACE) 1 MG tablet Take 1 tablet (1 mg total) by mouth daily. 09/28/14  Yes Thao P Le, DO  fexofenadine (ALLEGRA) 180 MG tablet Take 180 mg by mouth daily.   Yes Historical Provider, MD  fish oil-omega-3 fatty acids 1000 MG capsule Take 1 g by mouth 3 (three) times daily.    Yes Historical Provider, MD  fluticasone (FLONASE) 50 MCG/ACT nasal spray USE 2 SPRAYS IN EACH NOSTRIL ONCE DAILY 09/28/14  Yes Thao P Le, DO  metoprolol tartrate (LOPRESSOR) 25 MG tablet Take 0.5 tablets (12.5 mg total) by mouth 2 (two) times daily. 03/20/14  Yes Chelle S Jeffery, PA-C  pravastatin (PRAVACHOL) 40 MG tablet  TAKE 1 TABLET BY MOUTH ONCE DAILY 09/28/14  Yes Thao P Le, DO  ranitidine (ZANTAC) 150 MG tablet Take 150 mg by mouth daily with supper.    Yes Historical Provider, MD  sertraline (ZOLOFT) 25 MG tablet Take 1 tablet (25 mg total) by mouth daily. 09/28/14  Yes Thao P Le, DO   History   Social History  . Marital Status: Married    Spouse Name: Daisy Nelson    Number of Children: 1  . Years of Education: 12   Occupational History  . insurance agent    Social History Main Topics  . Smoking status: Former Smoker -- 0.50 packs/day for 36 years    Types: Cigarettes    Start date: 03/15/1963     Quit date: 01/08/2005  . Smokeless tobacco: Never Used  . Alcohol Use: No  . Drug Use: No  . Sexual Activity:    Partners: Male    Birth Control/ Protection: Surgical   Other Topics Concern  . Not on file   Social History Narrative   Lives with her husband and their cat, Daisy Nelson.   Review of Systems  HENT: Positive for congestion, ear pain, postnasal drip, sinus pressure and sore throat.   Respiratory: Positive for cough. Negative for shortness of breath and wheezing.   Neurological: Positive for headaches.  All other systems reviewed and are negative.      Objective:   Physical Exam  Constitutional: She is oriented to person, place, and time. She appears well-developed and well-nourished. No distress.  HENT:  Head: Normocephalic and atraumatic.  Right Ear: Hearing, tympanic membrane, external ear and ear canal normal.  Left Ear: Hearing, tympanic membrane, external ear and ear canal normal.  Nose: Nose normal.  Mouth/Throat: Oropharynx is clear and moist. No oropharyngeal exudate.  Minimal clear fluid behind the TMs; no redness; no discoloration of fluid behind TM; TMs are clear; tender on bilateral maxillary sinuses; frontal sinuses were nontender  Eyes: Conjunctivae and EOM are normal. Pupils are equal, round, and reactive to light.  Swelling of the turbinates; no discharge  Cardiovascular: Normal rate, regular rhythm, normal heart sounds and intact distal pulses.   No murmur heard. Pulmonary/Chest: Effort normal and breath sounds normal. No respiratory distress. She has no wheezes. She has no rhonchi.  Neurological: She is alert and oriented to person, place, and time.  Skin: Skin is warm and dry. No rash noted.  Psychiatric: She has a normal mood and affect. Her behavior is normal.  Nursing note and vitals reviewed.     Filed Vitals:   11/13/14 2000  BP: 124/64  Pulse: 65  Temp: 98.5 F (36.9 C)  TempSrc: Oral  Resp: 16  Height: 5\' 4"  (1.626 m)  Weight: 169  lb 6 oz (76.828 kg)  SpO2: 98%   Assessment & Plan:   Daisy Nelson is a 68 y.o. female Acute maxillary sinusitis, recurrence not specified - Plan: azithromycin (ZITHROMAX) 250 MG tablet  Cough - Plan: benzonatate (TESSALON) 100 MG capsule    -possible early sinusitis with secondary sickening. Viral etiology possible as well. Due to early timing, and penicillin allergy, can try Zpak first, but if persistent sx's - consider quinolone. Deferred this at present with tendonopathy risk. Tessalon perles if needed for cough, sx care and RTC precautions discussed.   Meds ordered this encounter  Medications  . azithromycin (ZITHROMAX) 250 MG tablet    Sig: Take 2 pills by mouth on day 1, then 1 pill by  mouth per day on days 2 through 5.    Dispense:  6 tablet    Refill:  0  . benzonatate (TESSALON) 100 MG capsule    Sig: Take 1 capsule (100 mg total) by mouth 3 (three) times daily as needed for cough.    Dispense:  20 capsule    Refill:  0   Patient Instructions  Saline nasal spray atleast 4 times per day, over the counter mucinex if needed, and can try tessalon perles for cough suppression.  drink plenty of fluids.  Return to the clinic or go to the nearest emergency room if any of your symptoms worsen or new symptoms occur.   Sinusitis Sinusitis is redness, soreness, and inflammation of the paranasal sinuses. Paranasal sinuses are air pockets within the bones of your face (beneath the eyes, the middle of the forehead, or above the eyes). In healthy paranasal sinuses, mucus is able to drain out, and air is able to circulate through them by way of your nose. However, when your paranasal sinuses are inflamed, mucus and air can become trapped. This can allow bacteria and other germs to grow and cause infection. Sinusitis can develop quickly and last only a short time (acute) or continue over a long period (chronic). Sinusitis that lasts for more than 12 weeks is considered chronic.  CAUSES    Causes of sinusitis include:  Allergies.  Structural abnormalities, such as displacement of the cartilage that separates your nostrils (deviated septum), which can decrease the air flow through your nose and sinuses and affect sinus drainage.  Functional abnormalities, such as when the small hairs (cilia) that line your sinuses and help remove mucus do not work properly or are not present. SIGNS AND SYMPTOMS  Symptoms of acute and chronic sinusitis are the same. The primary symptoms are pain and pressure around the affected sinuses. Other symptoms include:  Upper toothache.  Earache.  Headache.  Bad breath.  Decreased sense of smell and taste.  A cough, which worsens when you are lying flat.  Fatigue.  Fever.  Thick drainage from your nose, which often is green and may contain pus (purulent).  Swelling and warmth over the affected sinuses. DIAGNOSIS  Your health care provider will perform a physical exam. During the exam, your health care provider may:  Look in your nose for signs of abnormal growths in your nostrils (nasal polyps).  Tap over the affected sinus to check for signs of infection.  View the inside of your sinuses (endoscopy) using an imaging device that has a light attached (endoscope). If your health care provider suspects that you have chronic sinusitis, one or more of the following tests may be recommended:  Allergy tests.  Nasal culture. A sample of mucus is taken from your nose, sent to a lab, and screened for bacteria.  Nasal cytology. A sample of mucus is taken from your nose and examined by your health care provider to determine if your sinusitis is related to an allergy. TREATMENT  Most cases of acute sinusitis are related to a viral infection and will resolve on their own within 10 days. Sometimes medicines are prescribed to help relieve symptoms (pain medicine, decongestants, nasal steroid sprays, or saline sprays).  However, for sinusitis  related to a bacterial infection, your health care provider will prescribe antibiotic medicines. These are medicines that will help kill the bacteria causing the infection.  Rarely, sinusitis is caused by a fungal infection. In theses cases, your health care provider will prescribe  antifungal medicine. For some cases of chronic sinusitis, surgery is needed. Generally, these are cases in which sinusitis recurs more than 3 times per year, despite other treatments. HOME CARE INSTRUCTIONS   Drink plenty of water. Water helps thin the mucus so your sinuses can drain more easily.  Use a humidifier.  Inhale steam 3 to 4 times a day (for example, sit in the bathroom with the shower running).  Apply a warm, moist washcloth to your face 3 to 4 times a day, or as directed by your health care provider.  Use saline nasal sprays to help moisten and clean your sinuses.  Take medicines only as directed by your health care provider.  If you were prescribed either an antibiotic or antifungal medicine, finish it all even if you start to feel better. SEEK IMMEDIATE MEDICAL CARE IF:  You have increasing pain or severe headaches.  You have nausea, vomiting, or drowsiness.  You have swelling around your face.  You have vision problems.  You have a stiff neck.  You have difficulty breathing. MAKE SURE YOU:   Understand these instructions.  Will watch your condition.  Will get help right away if you are not doing well or get worse. Document Released: 11/16/2005 Document Revised: 04/02/2014 Document Reviewed: 12/01/2011 [facility] Patient Information 2015 [place], [place]. This information is not intended to replace advice given to you by your health care provider. Make sure you discuss any questions you have with your health care provider.     I personally performed the services described in this documentation, which was scribed in my presence. The recorded information has been reviewed and  considered, and addended by me as needed.

## 2014-11-13 NOTE — Patient Instructions (Signed)
Saline nasal spray atleast 4 times per day, over the counter mucinex if needed, and can try tessalon perles for cough suppression.  drink plenty of fluids.  Return to the clinic or go to the nearest emergency room if any of your symptoms worsen or new symptoms occur.   Sinusitis Sinusitis is redness, soreness, and inflammation of the paranasal sinuses. Paranasal sinuses are air pockets within the bones of your face (beneath the eyes, the middle of the forehead, or above the eyes). In healthy paranasal sinuses, mucus is able to drain out, and air is able to circulate through them by way of your nose. However, when your paranasal sinuses are inflamed, mucus and air can become trapped. This can allow bacteria and other germs to grow and cause infection. Sinusitis can develop quickly and last only a short time (acute) or continue over a long period (chronic). Sinusitis that lasts for more than 12 weeks is considered chronic.  CAUSES  Causes of sinusitis include:  Allergies.  Structural abnormalities, such as displacement of the cartilage that separates your nostrils (deviated septum), which can decrease the air flow through your nose and sinuses and affect sinus drainage.  Functional abnormalities, such as when the small hairs (cilia) that line your sinuses and help remove mucus do not work properly or are not present. SIGNS AND SYMPTOMS  Symptoms of acute and chronic sinusitis are the same. The primary symptoms are pain and pressure around the affected sinuses. Other symptoms include:  Upper toothache.  Earache.  Headache.  Bad breath.  Decreased sense of smell and taste.  A cough, which worsens when you are lying flat.  Fatigue.  Fever.  Thick drainage from your nose, which often is green and may contain pus (purulent).  Swelling and warmth over the affected sinuses. DIAGNOSIS  Your health care provider will perform a physical exam. During the exam, your health care provider  may:  Look in your nose for signs of abnormal growths in your nostrils (nasal polyps).  Tap over the affected sinus to check for signs of infection.  View the inside of your sinuses (endoscopy) using an imaging device that has a light attached (endoscope). If your health care provider suspects that you have chronic sinusitis, one or more of the following tests may be recommended:  Allergy tests.  Nasal culture. A sample of mucus is taken from your nose, sent to a lab, and screened for bacteria.  Nasal cytology. A sample of mucus is taken from your nose and examined by your health care provider to determine if your sinusitis is related to an allergy. TREATMENT  Most cases of acute sinusitis are related to a viral infection and will resolve on their own within 10 days. Sometimes medicines are prescribed to help relieve symptoms (pain medicine, decongestants, nasal steroid sprays, or saline sprays).  However, for sinusitis related to a bacterial infection, your health care provider will prescribe antibiotic medicines. These are medicines that will help kill the bacteria causing the infection.  Rarely, sinusitis is caused by a fungal infection. In theses cases, your health care provider will prescribe antifungal medicine. For some cases of chronic sinusitis, surgery is needed. Generally, these are cases in which sinusitis recurs more than 3 times per year, despite other treatments. HOME CARE INSTRUCTIONS   Drink plenty of water. Water helps thin the mucus so your sinuses can drain more easily.  Use a humidifier.  Inhale steam 3 to 4 times a day (for example, sit in the bathroom  with the shower running).  Apply a warm, moist washcloth to your face 3 to 4 times a day, or as directed by your health care provider.  Use saline nasal sprays to help moisten and clean your sinuses.  Take medicines only as directed by your health care provider.  If you were prescribed either an antibiotic or  antifungal medicine, finish it all even if you start to feel better. SEEK IMMEDIATE MEDICAL CARE IF:  You have increasing pain or severe headaches.  You have nausea, vomiting, or drowsiness.  You have swelling around your face.  You have vision problems.  You have a stiff neck.  You have difficulty breathing. MAKE SURE YOU:   Understand these instructions.  Will watch your condition.  Will get help right away if you are not doing well or get worse. Document Released: 11/16/2005 Document Revised: 04/02/2014 Document Reviewed: 12/01/2011 [facility] Patient Information 2015 [place], [place]. This information is not intended to replace advice given to you by your health care provider. Make sure you discuss any questions you have with your health care provider.

## 2015-01-14 ENCOUNTER — Ambulatory Visit (INDEPENDENT_AMBULATORY_CARE_PROVIDER_SITE_OTHER): Payer: Medicare Other | Admitting: Internal Medicine

## 2015-01-14 ENCOUNTER — Encounter: Payer: Self-pay | Admitting: Internal Medicine

## 2015-01-14 VITALS — BP 124/62 | HR 51 | Ht 65.0 in | Wt 169.1 lb

## 2015-01-14 DIAGNOSIS — F419 Anxiety disorder, unspecified: Secondary | ICD-10-CM

## 2015-01-14 DIAGNOSIS — E785 Hyperlipidemia, unspecified: Secondary | ICD-10-CM

## 2015-01-14 DIAGNOSIS — I480 Paroxysmal atrial fibrillation: Secondary | ICD-10-CM

## 2015-01-14 DIAGNOSIS — I341 Nonrheumatic mitral (valve) prolapse: Secondary | ICD-10-CM

## 2015-01-14 NOTE — Patient Instructions (Signed)
Your physician wants you to follow-up in: 1 year with Dr. Hilty. You will receive a reminder letter in the mail two months in advance. If you don't receive a letter, please call our office to schedule the follow-up appointment.  

## 2015-01-14 NOTE — Progress Notes (Signed)
OFFICE NOTE  Chief Complaint:  No complaints  Primary Care Physician: Wynne Dust  ID:  Daisy Nelson, DOB Oct 14, 1946, MRN [id number]  PCP:  JEFFERY,CHELLE, PA-C  Primary Cardiologist:  Analeigha Nauman     History of Present Illness: Daisy Nelson is a 69 y.o. female no prior history of CAD or AF, presented to the ER 3/10 around 3am with complaints of jaw pain, palpitations, and SOB. Her symptoms woke her from sleep around 12:30am. In the ER she was noted to be in Rapid AF. Her EKG shows diffuse ST depression. Initial Troponin is negative. Her symptoms improved after Diltiazem IV was started. She denied any prior history of similar symptoms in the past. She did take Mucinex D earlier this week for sinus infection.   The patient was admitted to [facility] and started on diltiazem, heparin, ASA, beta blocker and nitrates. She spontaneously converted to NSR and ruled out for MI. Lexiscan myovew revealed normal LVF, no ischemia and fixed defects involving the apical segments of the inferior septum and inferior lateral walls. 2D echo confirmed normal EF of 55-60%. Mild TR. CHADSVSC= 1. She was started on Xarelto.  Lopressor was continued.   She was last seen by Tarri Fuller, PA-C, as she was about to have cataract surgery.  It went very well for her and she can now see clearly.  She reports only an accessional fluttering in her chest.  I did decrease her lopressor to 12.5 mg bid during the last visit because she was having a HR in the 40's.  She otherwise denies  nausea, vomiting, fever, chest pain, shortness of breath, orthopnea, dizziness, PND, cough, congestion, abdominal pain, hematochezia, melena, lower extremity edema.  [name] back in the office today. She is currently without complaints. She is active denies any chest pain or shortness of breath. She very rarely gets palpitations the last only for a few seconds. She denies any recurrent atrial fibrillation and her EKG today sinus  rhythm. She's had no bleeding problems on aspirin. She had laboratory work in October which shows a well controlled lipid profile on pravastatin. Weight is stable   Wt Readings from Last 3 Encounters:  01/14/15 169 lb 1.6 oz (76.703 kg)  11/13/14 169 lb 6 oz (76.828 kg)  09/28/14 169 lb 9.6 oz (76.93 kg)     Past Medical History  Diagnosis Date  . GERD (gastroesophageal reflux disease)   . Allergy   . Hyperlipidemia   . Depression   . Anxiety   . DJD (degenerative joint disease) of cervical spine   . Nephrolithiasis   . Hyperglycemia   . Colon polyps   . AF (atrial fibrillation)     AF with RVR 01/2013  . Family history of CABG     Current Outpatient Prescriptions  Medication Sig Dispense Refill  . aspirin 325 MG tablet Take 325 mg by mouth daily.    . benzonatate (TESSALON) 100 MG capsule Take 1 capsule (100 mg total) by mouth 3 (three) times daily as needed for cough. 20 capsule 0  . estradiol (ESTRACE) 1 MG tablet Take 1 tablet (1 mg total) by mouth daily. 90 tablet 3  . fexofenadine (ALLEGRA) 180 MG tablet Take 180 mg by mouth daily.    . fish oil-omega-3 fatty acids 1000 MG capsule Take 1 g by mouth 3 (three) times daily.     . fluticasone (FLONASE) 50 MCG/ACT nasal spray USE 2 SPRAYS IN EACH NOSTRIL ONCE DAILY 16 g 11  .  metoprolol tartrate (LOPRESSOR) 25 MG tablet Take 0.5 tablets (12.5 mg total) by mouth 2 (two) times daily. 90 tablet 3  . pravastatin (PRAVACHOL) 40 MG tablet TAKE 1 TABLET BY MOUTH ONCE DAILY 90 tablet 3  . ranitidine (ZANTAC) 150 MG tablet Take 150 mg by mouth daily with supper.     . sertraline (ZOLOFT) 25 MG tablet Take 1 tablet (25 mg total) by mouth daily. 90 tablet 3   Current Facility-Administered Medications  Medication Dose Route Frequency Provider Last Rate Last Dose  . pneumococcal 13-valent conjugate vaccine (PREVNAR 13) injection 0.5 mL  0.5 mL Intramuscular Once Thao P Le, DO        Allergies:    Allergies  Allergen Reactions  .  Adhesive [Tape]     Burn skin  . Erythromycin     Gi intolerance  . Naproxen Nausea And Vomiting    Abdominal pain   . Penicillins Hives    Social History:  The patient  reports that she quit smoking about 10 years ago. Her smoking use included Cigarettes. She started smoking about 51 years ago. She has a 18 pack-year smoking history. She has never used smokeless tobacco. She reports that she does not drink alcohol or use illicit drugs.   Family history:   Family History  Problem Relation Age of Onset  . Stroke Mother   . Diabetes Father   . Heart disease Father     CABG  . Arthritis Father     C6-7  . Skin cancer Father     SCC  . Arthritis Brother     s/p THR    ROS: A comprehensive review of systems was negative.  HOME MEDS: Current Outpatient Prescriptions  Medication Sig Dispense Refill  . aspirin 325 MG tablet Take 325 mg by mouth daily.    . benzonatate (TESSALON) 100 MG capsule Take 1 capsule (100 mg total) by mouth 3 (three) times daily as needed for cough. 20 capsule 0  . estradiol (ESTRACE) 1 MG tablet Take 1 tablet (1 mg total) by mouth daily. 90 tablet 3  . fexofenadine (ALLEGRA) 180 MG tablet Take 180 mg by mouth daily.    . fish oil-omega-3 fatty acids 1000 MG capsule Take 1 g by mouth 3 (three) times daily.     . fluticasone (FLONASE) 50 MCG/ACT nasal spray USE 2 SPRAYS IN EACH NOSTRIL ONCE DAILY 16 g 11  . metoprolol tartrate (LOPRESSOR) 25 MG tablet Take 0.5 tablets (12.5 mg total) by mouth 2 (two) times daily. 90 tablet 3  . pravastatin (PRAVACHOL) 40 MG tablet TAKE 1 TABLET BY MOUTH ONCE DAILY 90 tablet 3  . ranitidine (ZANTAC) 150 MG tablet Take 150 mg by mouth daily with supper.     . sertraline (ZOLOFT) 25 MG tablet Take 1 tablet (25 mg total) by mouth daily. 90 tablet 3   Current Facility-Administered Medications  Medication Dose Route Frequency Provider Last Rate Last Dose  . pneumococcal 13-valent conjugate vaccine (PREVNAR 13) injection 0.5 mL   0.5 mL Intramuscular Once Thao P Le, DO        LABS/IMAGING: No results found for this or any previous visit (from the past 22 hour(s)). No results found.  VITALS: BP 124/62 mmHg  Pulse 51  Ht 5\' 5"  (1.651 m)  Wt 169 lb 1.6 oz (76.703 kg)  BMI 28.14 kg/m2  EXAM: General appearance: alert and no distress Neck: no carotid bruit and no JVD Lungs: clear to auscultation bilaterally Heart:  regular rate and rhythm, S1, S2 normal, no murmur, click, rub or gallop Abdomen: soft, non-tender; bowel sounds normal; no masses,  no organomegaly Extremities: extremities normal, atraumatic, no cyanosis or edema Pulses: 2+ and symmetric Skin: Skin color, texture, turgor normal. No rashes or lesions Neurologic: Grossly normal Psych: Mood, affect normal  EKG: Sinus bradycardia 51, nonspecific T wave changes  ASSESSMENT: 1. Paroxysmal atrial fibrillation - no significant recurrence, low CHADSVASC score of 1 (female >65) 2. Dyslipidemia- at goal  PLAN: 1.   Mrs. Gaona continues to have brief, infrequent palpitations which have improved on beta blocker.  She is very low risk of stroke and continuing aspirin is very reasonable. Her cholesterol is at goal on pravastatin. Plan to see her back annually or sooner as necessary.  Pixie Casino, MD, [facility] Attending Cardiologist [facility]  Shizuko Wojdyla C 01/14/2015, 1:39 PM

## 2015-03-26 ENCOUNTER — Ambulatory Visit (INDEPENDENT_AMBULATORY_CARE_PROVIDER_SITE_OTHER): Payer: PRIVATE HEALTH INSURANCE | Admitting: Physician Assistant

## 2015-03-26 ENCOUNTER — Encounter: Payer: Self-pay | Admitting: Physician Assistant

## 2015-03-26 VITALS — BP 106/70 | HR 47 | Temp 98.3°F | Resp 16 | Ht 64.5 in | Wt 166.4 lb

## 2015-03-26 DIAGNOSIS — R7303 Prediabetes: Secondary | ICD-10-CM | POA: Insufficient documentation

## 2015-03-26 DIAGNOSIS — E785 Hyperlipidemia, unspecified: Secondary | ICD-10-CM | POA: Diagnosis not present

## 2015-03-26 DIAGNOSIS — E119 Type 2 diabetes mellitus without complications: Secondary | ICD-10-CM | POA: Insufficient documentation

## 2015-03-26 DIAGNOSIS — R739 Hyperglycemia, unspecified: Secondary | ICD-10-CM | POA: Diagnosis not present

## 2015-03-26 LAB — LIPID PANEL
Cholesterol: 167 mg/dL (ref 0–200)
HDL: 53 mg/dL (ref >=46)
LDL Cholesterol: 81 mg/dL (ref 0–99)
Total CHOL/HDL Ratio: 3.2 Ratio
Triglycerides: 164 mg/dL — ABNORMAL HIGH (ref <150)
VLDL: 33 mg/dL (ref 0–40)

## 2015-03-26 LAB — COMPREHENSIVE METABOLIC PANEL
ALT: 18 U/L (ref 0–35)
AST: 18 U/L (ref 0–37)
Albumin: 4 g/dL (ref 3.5–5.2)
Alkaline Phosphatase: 76 U/L (ref 39–117)
BUN: 15 mg/dL (ref 6–23)
CO2: 25 mEq/L (ref 19–32)
Calcium: 9.1 mg/dL (ref 8.4–10.5)
Chloride: 103 mEq/L (ref 96–112)
Creat: 0.73 mg/dL (ref 0.50–1.10)
Glucose, Bld: 106 mg/dL — ABNORMAL HIGH (ref 70–99)
Potassium: 4.7 mEq/L (ref 3.5–5.3)
Sodium: 138 mEq/L (ref 135–145)
Total Bilirubin: 0.4 mg/dL (ref 0.2–1.2)
Total Protein: 6.7 g/dL (ref 6.0–8.3)

## 2015-03-26 LAB — POCT GLYCOSYLATED HEMOGLOBIN (HGB A1C): Hemoglobin A1C: 6

## 2015-03-26 LAB — GLUCOSE, POCT (MANUAL RESULT ENTRY): POC Glucose: 119 mg/dl — AB (ref 70–99)

## 2015-03-26 NOTE — Progress Notes (Signed)
Subjective:    Patient ID: Daisy Nelson, female    DOB: July 12, 1946, 69 y.o.   MRN: [id number]  HPI  Daisy Nelson is a very pleasant 69 year old female who presents today for follow-up of hyperlipidemia, hyperglycemia, and anxiety.  She has no complaints today and states she is doing well. She is fasting this morning and waiting for lab work to be drawn. She has no changes to her medical history since she was seen for her annual physical 09/29/2015.   She has a history of atrial fibrillation and is well controlled on Metoprolol Tartate.   She does not regularly check her blood sugar or blood pressure at home, but can tell if her sugar is getting low and will eat something. She tries to eat healthy with chicken, fish, fruits and vegetables. She does not exercise apart from walking around at work.   Says that her anxiety has been well controlled on Zoloft. Has no further complaints at this time.   Review of Systems  Constitutional: Negative for fever, chills, diaphoresis, fatigue and unexpected weight change.  HENT: Negative for congestion, postnasal drip, rhinorrhea, sinus pressure, sneezing and sore throat.   Eyes: Negative for pain and itching.  Respiratory: Negative for cough, chest tightness and shortness of breath.   Cardiovascular: Negative for chest pain and palpitations.  Gastrointestinal: Negative for nausea, vomiting, abdominal pain, diarrhea and constipation.  Genitourinary: Negative for dysuria, urgency and frequency.  Musculoskeletal: Negative for myalgias and arthralgias.  Skin: Negative for pallor.  Neurological: Negative for dizziness, light-headedness and headaches.       Objective:   Physical Exam  Constitutional: She is oriented to person, place, and time. She appears well-developed and well-nourished. No distress.  BP 106/70 mmHg  Pulse 47  Temp(Src) 98.3 F (36.8 C) (Oral)  Resp 16  Ht 5' 4.5" (1.638 m)  Wt 166 lb 6.4 oz (75.479 kg)  BMI 28.13  kg/m2  SpO2 95%  HENT:  Head: Normocephalic and atraumatic.  Right Ear: Hearing, tympanic membrane, external ear and ear canal normal.  Left Ear: Hearing, tympanic membrane, external ear and ear canal normal.  Nose: Nose normal.  Mouth/Throat: Uvula is midline. No oropharyngeal exudate, posterior oropharyngeal edema or posterior oropharyngeal erythema.  Eyes: Conjunctivae are normal. Pupils are equal, round, and reactive to light. No scleral icterus.  Neck: Neck supple.  Cardiovascular: Regular rhythm, normal heart sounds and intact distal pulses.  Bradycardia present.  Exam reveals no gallop.   No murmur heard. Pulmonary/Chest: Effort normal and breath sounds normal. She has no wheezes. She has no rales.  Lymphadenopathy:       Head (right side): No submental, no submandibular and no tonsillar adenopathy present.       Head (left side): No submental, no submandibular and no tonsillar adenopathy present.    She has no cervical adenopathy.       Right: No supraclavicular adenopathy present.       Left: No supraclavicular adenopathy present.  Neurological: She is alert and oriented to person, place, and time.  Skin: Skin is warm and dry.  Psychiatric: She has a normal mood and affect. Her behavior is normal.   Results for orders placed or performed in visit on 03/26/15  POCT glucose (manual entry)  Result Value Ref Range   POC Glucose 119 (A) 70 - 99 mg/dl  POCT glycosylated hemoglobin (Hb A1C)  Result Value Ref Range   Hemoglobin A1C 6.0  Assessment & Plan:  1. Hyperlipemia Stable. Continue current regimen and focus on healthy eating and exercise. Continue taking Fish oil and Pravastatin. Awaiting lab results. - Comprehensive metabolic panel - Lipid panel  2. Hyperglycemia Mildly elevated fasting glucose. Hgb A1C is within normal range. Continue healthy eating and encourage exercise.  - POCT glucose (manual entry) - POCT glycosylated hemoglobin (Hb A1C)

## 2015-03-26 NOTE — Progress Notes (Signed)
Patient ID: Daisy Nelson, female    DOB: [birth year], 69 y.o.   MRN: [id number]  PCP: Wynne Dust  Subjective:   Chief Complaint  Patient presents with  . Follow-up  . Hyperlipidemia  . Hyperglycemia  . Anxiety    "better"    HPI Presents for evaluation of hyperlipidemia, hyperglycemia, and anxiety.  She has no complaints today and states she is doing well. She is fasting this morning and waiting for lab work to be drawn. She has no changes to her medical history since she was seen for her annual physical 09/29/2015.   She has a history of atrial fibrillation and is well controlled on Metoprolol Tartate.   She does not regularly check her blood sugar or blood pressure at home, but can tell if her sugar is getting low and will eat something. She tries to eat healthy with chicken, fish, fruits and vegetables. She does not exercise apart from walking around at work.   Says that her anxiety has been well controlled on Zoloft. Has no further complaints at this time.    Review of Systems  Constitutional: Negative for fever, chills and fatigue.  HENT: Negative for congestion, postnasal drip, rhinorrhea, sinus pressure and sneezing.   Eyes: Negative for visual disturbance.  Respiratory: Negative for cough, shortness of breath and wheezing.   Gastrointestinal: Negative for nausea, vomiting, diarrhea and constipation.  Genitourinary: Negative for dysuria, urgency and frequency.  Musculoskeletal: Negative for myalgias and arthralgias.  Skin: Negative for rash.  Neurological: Negative for weakness, light-headedness and headaches.  Psychiatric/Behavioral: Negative for sleep disturbance and dysphoric mood. The patient is not nervous/anxious.        Patient Active Problem List   Diagnosis Date Noted  . PAF (paroxysmal atrial fibrillation) 07/10/2013  . Unstable angina 02/06/2013  . Atrial fibrillation with rapid ventricular response 02/06/2013  . Family history of  coronary artery bypass surgery 02/06/2013  . GERD (gastroesophageal reflux disease) 10/25/2012  . Allergy 10/25/2012  . Hyperlipidemia 10/25/2012  . Depression 10/25/2012  . Anxiety 10/25/2012  . MVP (mitral valve prolapse) 10/25/2012  . DJD (degenerative joint disease) of cervical spine 10/25/2012  . Nephrolithiasis 10/25/2012  . Colon polyps 10/25/2012     Prior to Admission medications   Medication Sig Start Date End Date Taking? Authorizing Provider  aspirin 325 MG tablet Take 325 mg by mouth daily.   Yes Historical Provider, MD  estradiol (ESTRACE) 1 MG tablet Take 1 tablet (1 mg total) by mouth daily. 09/28/14  Yes Thao P Le, DO  fexofenadine (ALLEGRA) 180 MG tablet Take 180 mg by mouth daily.   Yes Historical Provider, MD  fish oil-omega-3 fatty acids 1000 MG capsule Take 1 g by mouth 3 (three) times daily.    Yes Historical Provider, MD  metoprolol tartrate (LOPRESSOR) 25 MG tablet Take 0.5 tablets (12.5 mg total) by mouth 2 (two) times daily. 03/20/14  Yes Kristan Brummitt S Vonya Ohalloran, PA-C  pravastatin (PRAVACHOL) 40 MG tablet TAKE 1 TABLET BY MOUTH ONCE DAILY 09/28/14  Yes Thao P Le, DO  ranitidine (ZANTAC) 150 MG tablet Take 150 mg by mouth daily with supper.    Yes Historical Provider, MD  sertraline (ZOLOFT) 25 MG tablet Take 1 tablet (25 mg total) by mouth daily. 09/28/14  Yes Thao P Le, DO  fluticasone (FLONASE) 50 MCG/ACT nasal spray USE 2 SPRAYS IN EACH NOSTRIL ONCE DAILY 09/28/14   Thao P Le, DO     Allergies  Allergen Reactions  . Adhesive [  Tape]     Burn skin  . Erythromycin     Gi intolerance  . Naproxen Nausea And Vomiting    Abdominal pain   . Penicillins Hives       Objective:  Physical Exam  Constitutional: She is oriented to person, place, and time. Vital signs are normal. She appears well-developed and well-nourished. She is active and cooperative. No distress.  BP 106/70 mmHg  Pulse 47  Temp(Src) 98.3 F (36.8 C) (Oral)  Resp 16  Ht 5' 4.5" (1.638 m)   Wt 166 lb 6.4 oz (75.479 kg)  BMI 28.13 kg/m2  SpO2 95%  HENT:  Head: Normocephalic and atraumatic.  Right Ear: Hearing normal.  Left Ear: Hearing normal.  Eyes: Conjunctivae are normal. No scleral icterus.  Neck: Normal range of motion. Neck supple. No thyromegaly present.  Cardiovascular: Normal rate, regular rhythm and normal heart sounds.   Pulses:      Radial pulses are 2+ on the right side, and 2+ on the left side.  Pulmonary/Chest: Effort normal and breath sounds normal.  Lymphadenopathy:       Head (right side): No tonsillar, no preauricular, no posterior auricular and no occipital adenopathy present.       Head (left side): No tonsillar, no preauricular, no posterior auricular and no occipital adenopathy present.    She has no cervical adenopathy.       Right: No supraclavicular adenopathy present.       Left: No supraclavicular adenopathy present.  Neurological: She is alert and oriented to person, place, and time. No sensory deficit.  Skin: Skin is warm, dry and intact. No rash noted. No cyanosis or erythema. Nails show no clubbing.  Psychiatric: She has a normal mood and affect. Her speech is normal and behavior is normal.   Results for orders placed or performed in visit on 03/26/15  POCT glucose (manual entry)  Result Value Ref Range   POC Glucose 119 (A) 70 - 99 mg/dl  POCT glycosylated hemoglobin (Hb A1C)  Result Value Ref Range   Hemoglobin A1C 6.0            Assessment & Plan:   1. Hyperlipemia Await lab results. Adjust medication if needed. - Comprehensive metabolic panel - Lipid panel  2. Hyperglycemia Healthy eating, regular exercise. Does not need medication at present. Continue to monitor. - POCT glucose (manual entry) - POCT glycosylated hemoglobin (Hb A1C)   Return in about 6 months (around 09/25/2015).  Fara Chute, PA-C Physician Assistant-Certified [facility]

## 2015-03-26 NOTE — Patient Instructions (Signed)
I will contact you with your lab results as soon as they are available.   If you have not heard from me in 2 weeks, please contact me.  The fastest way to get your results is to register for My Chart (see the instructions on the last page of this printout).   

## 2015-04-13 ENCOUNTER — Other Ambulatory Visit: Payer: Self-pay

## 2015-04-13 DIAGNOSIS — I4891 Unspecified atrial fibrillation: Secondary | ICD-10-CM

## 2015-04-13 MED ORDER — METOPROLOL TARTRATE 25 MG PO TABS: 12.5000 mg | ORAL_TABLET | Freq: Two times a day (BID) | ORAL | Status: DC

## 2015-04-17 ENCOUNTER — Ambulatory Visit (INDEPENDENT_AMBULATORY_CARE_PROVIDER_SITE_OTHER): Payer: PRIVATE HEALTH INSURANCE | Admitting: Physician Assistant

## 2015-04-17 VITALS — BP 146/88 | HR 52 | Temp 98.7°F | Resp 18 | Ht 64.0 in | Wt 169.0 lb

## 2015-04-17 DIAGNOSIS — S39012A Strain of muscle, fascia and tendon of lower back, initial encounter: Secondary | ICD-10-CM

## 2015-04-17 DIAGNOSIS — M545 Low back pain, unspecified: Secondary | ICD-10-CM

## 2015-04-17 MED ORDER — CYCLOBENZAPRINE HCL 10 MG PO TABS: 10.0000 mg | ORAL_TABLET | Freq: Three times a day (TID) | ORAL | Status: DC | PRN

## 2015-04-17 NOTE — Patient Instructions (Signed)
If the muscle relaxer makes you too sleepy, try taking only half, or reserve it only for bedtime.  Increase the ibuprofen to 600 mg 4 times daily, but take it with food to reduce the risk of GI upset.  Consider a heating pad on the area to help relax the muscles.  Call [facility]-see if they can work you in this afternoon.

## 2015-04-17 NOTE — Progress Notes (Signed)
Patient ID: Daisy Nelson, female    DOB: 12-Apr-1946, 69 y.o.   MRN: [id number]  PCP: Wynne Dust  Subjective:   Chief Complaint  Patient presents with  . Back Pain    since friday     HPI Presents for evaluation of low back pain that began 04/12/2015 when she rose from sitting on the couch. No recalled trauma or injury. No benefit with OTC NSAIDS. No benefit with heat or ice. Increased pain with movement, especially positional changes. Yesterday on the way to work she had to slam on the brakes [place] and that aggravated the pain. She was only able to work 2 hours today before the pain became unbearable.  No loss of bowel or bladder control. No saddle anesthesia. No radicular pain or paresthesias. No LE weakness  No history of previous back injury.    Review of Systems  Constitutional: Negative for fever and chills.  Respiratory: Negative for shortness of breath.   Cardiovascular: Negative for chest pain.  Gastrointestinal: Negative for nausea and vomiting.  Genitourinary: Negative for dysuria, urgency, frequency, hematuria, decreased urine volume and difficulty urinating.  Musculoskeletal: Positive for myalgias and back pain. Negative for neck pain and neck stiffness.  Neurological: Negative for dizziness, weakness and numbness.       Patient Active Problem List   Diagnosis Date Noted  . Hyperglycemia 03/26/2015  . PAF (paroxysmal atrial fibrillation) 07/10/2013  . Unstable angina 02/06/2013  . Atrial fibrillation with rapid ventricular response 02/06/2013  . Family history of coronary artery bypass surgery 02/06/2013  . GERD (gastroesophageal reflux disease) 10/25/2012  . Allergy 10/25/2012  . Hyperlipidemia 10/25/2012  . Depression 10/25/2012  . Anxiety 10/25/2012  . MVP (mitral valve prolapse) 10/25/2012  . DJD (degenerative joint disease) of cervical spine 10/25/2012  . Nephrolithiasis 10/25/2012  . Colon polyps  10/25/2012     Prior to Admission medications   Medication Sig Start Date End Date Taking? Authorizing Provider  aspirin 325 MG tablet Take 325 mg by mouth daily.   Yes Historical Provider, MD  estradiol (ESTRACE) 1 MG tablet Take 1 tablet (1 mg total) by mouth daily. 09/28/14  Yes Thao P Le, DO  fish oil-omega-3 fatty acids 1000 MG capsule Take 1 g by mouth 3 (three) times daily.    Yes Historical Provider, MD  fluticasone (FLONASE) 50 MCG/ACT nasal spray USE 2 SPRAYS IN EACH NOSTRIL ONCE DAILY 09/28/14  Yes Thao P Le, DO  loratadine (CLARITIN) 10 MG tablet Take 10 mg by mouth daily.   Yes Historical Provider, MD  metoprolol tartrate (LOPRESSOR) 25 MG tablet Take 0.5 tablets (12.5 mg total) by mouth 2 (two) times daily. 04/13/15  Yes Venisa Frampton, PA-C  pravastatin (PRAVACHOL) 40 MG tablet TAKE 1 TABLET BY MOUTH ONCE DAILY 09/28/14  Yes Thao P Le, DO  ranitidine (ZANTAC) 150 MG tablet Take 150 mg by mouth daily with supper.    Yes Historical Provider, MD  sertraline (ZOLOFT) 25 MG tablet Take 1 tablet (25 mg total) by mouth daily. 09/28/14  Yes Thao P Le, DO  fexofenadine (ALLEGRA) 180 MG tablet Take 180 mg by mouth daily.    Historical Provider, MD     Allergies  Allergen Reactions  . Adhesive [Tape]     Burn skin  . Erythromycin     Gi intolerance  . Naproxen Nausea And Vomiting    Abdominal pain   . Penicillins Hives  Objective:  Physical Exam  Constitutional: She is oriented to person, place, and time. She appears well-developed and well-nourished. She is active and cooperative. No distress.  BP 146/88 mmHg  Pulse 52  Temp(Src) 98.7 F (37.1 C) (Oral)  Resp 18  Ht 5\' 4"  (1.626 m)  Wt 169 lb (76.658 kg)  BMI 28.99 kg/m2  SpO2 98%   Eyes: Conjunctivae are normal.  Cardiovascular: Bradycardia present.   Pulmonary/Chest: Effort normal.  Musculoskeletal:       Cervical back: Normal.       Thoracic back: Normal.       Lumbar back: She exhibits decreased range of  motion, tenderness, pain and spasm. She exhibits no bony tenderness, no swelling, no edema, no deformity and no laceration.       Back:  Neurological: She is alert and oriented to person, place, and time. She has normal strength. She displays no tremor. No sensory deficit. She exhibits normal muscle tone.  Reflex Scores:      Patellar reflexes are 2+ on the right side and 2+ on the left side.      Achilles reflexes are 2+ on the right side and 2+ on the left side. Skin: Skin is warm, dry and intact.  Psychiatric: She has a normal mood and affect. Her speech is normal and behavior is normal.           Assessment & Plan:   1. Bilateral low back pain without sciatica 2. Lumbar strain, initial encounter Increase OTC Ibuprofen from 400 mg to 600 mg, as long as she can tolerate that. dose. Massage. Heat - cyclobenzaprine (FLEXERIL) 10 MG tablet; Take 1 tablet (10 mg total) by mouth 3 (three) times daily as needed for muscle spasms.  Dispense: 30 tablet; Refill: 0   Return for re-evaluation if your pain persists in 2 weeks, or if it worsens.Fara Chute, PA-C Physician Assistant-Certified [facility] .

## 2015-04-18 NOTE — Progress Notes (Signed)
  Medical screening examination/treatment/procedure(s) were performed by non-physician practitioner and as supervising physician I was immediately available for consultation/collaboration.     

## 2015-05-13 ENCOUNTER — Other Ambulatory Visit: Payer: Self-pay

## 2015-05-13 DIAGNOSIS — Z1231 Encounter for screening mammogram for malignant neoplasm of breast: Secondary | ICD-10-CM

## 2015-06-12 ENCOUNTER — Other Ambulatory Visit: Payer: Self-pay | Admitting: Physician Assistant

## 2015-06-13 ENCOUNTER — Encounter: Payer: Self-pay | Admitting: Physician Assistant

## 2015-06-17 ENCOUNTER — Other Ambulatory Visit: Payer: Self-pay

## 2015-06-17 ENCOUNTER — Ambulatory Visit: Payer: Medicare Other

## 2015-07-10 ENCOUNTER — Ambulatory Visit
Admission: RE | Admit: 2015-07-10 | Discharge: 2015-07-10 | Disposition: A | Discharge status: Home / Self Care | Payer: 59 | Source: Ambulatory Visit

## 2015-07-10 DIAGNOSIS — Z1231 Encounter for screening mammogram for malignant neoplasm of breast: Secondary | ICD-10-CM

## 2015-07-13 ENCOUNTER — Other Ambulatory Visit: Payer: Self-pay | Admitting: Physician Assistant

## 2015-08-31 ENCOUNTER — Emergency Department (HOSPITAL_COMMUNITY): Payer: Medicare (Managed Care)

## 2015-08-31 ENCOUNTER — Emergency Department (HOSPITAL_COMMUNITY)
Admission: EM | Admit: 2015-08-31 | Discharge: 2015-08-31 | Disposition: A | Discharge status: Home / Self Care | Payer: Medicare (Managed Care) | Attending: Emergency Medicine | Admitting: Emergency Medicine

## 2015-08-31 ENCOUNTER — Encounter (HOSPITAL_COMMUNITY): Payer: Self-pay

## 2015-08-31 DIAGNOSIS — Z951 Presence of aortocoronary bypass graft: Secondary | ICD-10-CM | POA: Diagnosis not present

## 2015-08-31 DIAGNOSIS — Z880 Allergy status to penicillin: Secondary | ICD-10-CM | POA: Insufficient documentation

## 2015-08-31 DIAGNOSIS — I200 Unstable angina: Secondary | ICD-10-CM | POA: Diagnosis not present

## 2015-08-31 DIAGNOSIS — F329 Major depressive disorder, single episode, unspecified: Secondary | ICD-10-CM | POA: Diagnosis not present

## 2015-08-31 DIAGNOSIS — I480 Paroxysmal atrial fibrillation: Secondary | ICD-10-CM | POA: Diagnosis present

## 2015-08-31 DIAGNOSIS — I4891 Unspecified atrial fibrillation: Secondary | ICD-10-CM

## 2015-08-31 DIAGNOSIS — Z87891 Personal history of nicotine dependence: Secondary | ICD-10-CM | POA: Diagnosis not present

## 2015-08-31 DIAGNOSIS — Z86010 Personal history of colonic polyps: Secondary | ICD-10-CM | POA: Diagnosis not present

## 2015-08-31 DIAGNOSIS — E785 Hyperlipidemia, unspecified: Secondary | ICD-10-CM | POA: Diagnosis not present

## 2015-08-31 DIAGNOSIS — F419 Anxiety disorder, unspecified: Secondary | ICD-10-CM | POA: Insufficient documentation

## 2015-08-31 DIAGNOSIS — R6884 Jaw pain: Secondary | ICD-10-CM | POA: Diagnosis present

## 2015-08-31 DIAGNOSIS — Z87442 Personal history of urinary calculi: Secondary | ICD-10-CM | POA: Insufficient documentation

## 2015-08-31 DIAGNOSIS — K219 Gastro-esophageal reflux disease without esophagitis: Secondary | ICD-10-CM | POA: Insufficient documentation

## 2015-08-31 DIAGNOSIS — Z79899 Other long term (current) drug therapy: Secondary | ICD-10-CM | POA: Insufficient documentation

## 2015-08-31 DIAGNOSIS — E1169 Type 2 diabetes mellitus with other specified complication: Secondary | ICD-10-CM | POA: Diagnosis present

## 2015-08-31 DIAGNOSIS — Z7982 Long term (current) use of aspirin: Secondary | ICD-10-CM | POA: Insufficient documentation

## 2015-08-31 HISTORY — DX: Unspecified atrial fibrillation: I48.91

## 2015-08-31 HISTORY — DX: Jaw pain: R68.84

## 2015-08-31 LAB — BASIC METABOLIC PANEL
Anion gap: 9 (ref 5–15)
BUN: 13 mg/dL (ref 6–20)
CO2: 23 mmol/L (ref 22–32)
Calcium: 9.2 mg/dL (ref 8.9–10.3)
Chloride: 106 mmol/L (ref 101–111)
Creatinine, Ser: 0.79 mg/dL (ref 0.44–1.00)
GFR calc Af Amer: >60 mL/min (ref >60)
GFR calc non Af Amer: >60 mL/min (ref >60)
Glucose, Bld: 151 mg/dL — ABNORMAL HIGH (ref 65–99)
Potassium: 5.6 mmol/L — ABNORMAL HIGH (ref 3.5–5.1)
Sodium: 138 mmol/L (ref 135–145)

## 2015-08-31 LAB — CBC WITH DIFFERENTIAL/PLATELET
Basophils Absolute: 0 10*3/uL (ref 0.0–0.1)
Basophils Relative: 1 %
Eosinophils Absolute: 0.1 10*3/uL (ref 0.0–0.7)
Eosinophils Relative: 1 %
HCT: 41.2 % (ref 36.0–46.0)
Hemoglobin: 13.8 g/dL (ref 12.0–15.0)
Lymphocytes Relative: 23 %
Lymphs Abs: 1.3 10*3/uL (ref 0.7–4.0)
MCH: 30.3 pg (ref 26.0–34.0)
MCHC: 33.5 g/dL (ref 30.0–36.0)
MCV: 90.4 fL (ref 78.0–100.0)
Monocytes Absolute: 0.9 10*3/uL (ref 0.1–1.0)
Monocytes Relative: 16 %
Neutro Abs: 3.3 10*3/uL (ref 1.7–7.7)
Neutrophils Relative %: 59 %
Platelets: 177 10*3/uL (ref 150–400)
RBC: 4.56 MIL/uL (ref 3.87–5.11)
RDW: 13.5 % (ref 11.5–15.5)
WBC: 5.6 10*3/uL (ref 4.0–10.5)

## 2015-08-31 LAB — I-STAT TROPONIN, ED: Troponin i, poc: 0 ng/mL (ref 0.00–0.08)

## 2015-08-31 MED ORDER — RIVAROXABAN 20 MG PO TABS: 20.0000 mg | ORAL_TABLET | Freq: Every day | ORAL | Status: DC

## 2015-08-31 MED ORDER — RIVAROXABAN (XARELTO) EDUCATION KIT FOR DVT/PE PATIENTS
PACK | Freq: Once | Status: DC
  Filled 2015-08-31: qty 1

## 2015-08-31 MED ORDER — RIVAROXABAN (XARELTO) VTE STARTER PACK (15 & 20 MG)
ORAL_TABLET | ORAL | Status: DC
Start: 2015-08-31 — End: 2015-09-03

## 2015-08-31 MED ORDER — HEPARIN BOLUS VIA INFUSION
3000.0000 [IU] | Freq: Once | INTRAVENOUS | Status: AC
  Administered 2015-08-31: 3000 [IU] via INTRAVENOUS
  Filled 2015-08-31: qty 3000

## 2015-08-31 MED ORDER — RIVAROXABAN 20 MG PO TABS
20.0000 mg | ORAL_TABLET | Freq: Every day | ORAL | Status: DC
  Administered 2015-08-31: 20 mg via ORAL
  Filled 2015-08-31 (×2): qty 1

## 2015-08-31 MED ORDER — HEPARIN (PORCINE) IN NACL 100-0.45 UNIT/ML-% IJ SOLN
900.0000 [IU]/h | INTRAMUSCULAR | Status: DC
  Administered 2015-08-31: 900 [IU]/h via INTRAVENOUS
  Filled 2015-08-31: qty 250

## 2015-08-31 MED ORDER — DILTIAZEM LOAD VIA INFUSION
10.0000 mg | Freq: Once | INTRAVENOUS | Status: AC
  Administered 2015-08-31: 10 mg via INTRAVENOUS
  Filled 2015-08-31: qty 10

## 2015-08-31 MED ORDER — SODIUM CHLORIDE 0.9 % IV BOLUS (SEPSIS)
250.0000 mL | Freq: Once | INTRAVENOUS | Status: AC
  Administered 2015-08-31: 250 mL via INTRAVENOUS

## 2015-08-31 MED ORDER — DEXTROSE 5 % IV SOLN
5.0000 mg/h | INTRAVENOUS | Status: DC
  Administered 2015-08-31: 5 mg/h via INTRAVENOUS
  Filled 2015-08-31: qty 100

## 2015-08-31 MED ORDER — SODIUM CHLORIDE 0.9 % IV BOLUS (SEPSIS)
500.0000 mL | Freq: Once | INTRAVENOUS | Status: AC
  Administered 2015-08-31: 500 mL via INTRAVENOUS

## 2015-08-31 MED ORDER — IOHEXOL 350 MG/ML SOLN
50.0000 mL | Freq: Once | INTRAVENOUS | Status: AC | PRN
  Administered 2015-08-31: 50 mL via INTRAVENOUS

## 2015-08-31 NOTE — ED Notes (Signed)
Spoke with EDP about further patient care.

## 2015-08-31 NOTE — ED Provider Notes (Signed)
CSN: [id number]     Arrival date & time 08/31/15  1031 History   First MD Initiated Contact with Patient 08/31/15 1039     Chief Complaint  Patient presents with  . Jaw Pain    radiates down neck;     (Consider location/radiation/quality/duration/timing/severity/associated sxs/prior Treatment) The history is provided by the patient and medical records. No language interpreter was used.     Daisy Nelson is a 69 y.o. female  with a hx of GERD, depression, anxiety, a-fib (on ASA, no other anticoagulation) presents to the Emergency Department complaining of acute onset right-sided neck pain. She reports it woke her from sleep, was rated at a 10/10 and was described as an explosion in her neck. She reports that she does not know what time this woke her from sleep. She reports that since the initial attack symptoms have eased slightly but that she "does not feel right."  Patient reports that she takes aspirin for her A. fib but does not take any other anticoagulation.  She reports that she does not know when she is in or out of A. Fib.  He denies MI. No aggravating or alleviating factors. Patient denies fever, chills, headache, chest pain, shortness of breath, nausea, vomiting, diarrhea, syncope, dysuria. Patient also denies changes in vision, headache.  Record review shows the patient had a nuclear stress test in 2014 and echo at that time.  Stress test showed no induced ischemia.  Patient was admitted during this time for an episode of A. fib with RVR.  Cardiology: [facility]   Past Medical History  Diagnosis Date  . GERD (gastroesophageal reflux disease)   . Allergy   . Hyperlipidemia   . Depression   . Anxiety   . DJD (degenerative joint disease) of cervical spine   . Nephrolithiasis   . Hyperglycemia   . Colon polyps   . AF (atrial fibrillation) ([place])     AF with RVR 01/2013  . Family history of CABG    Past Surgical History  Procedure Laterality Date  . Tubal ligation  1977  .  Cesarean section  1976  . Abdominal hysterectomy  1991  . Laparoscopy  1976  . Ganglion cyst excision      Left hand  . Cataract extraction, bilateral Bilateral 03/2013  . Transthoracic echocardiogram  01/2013    EF 55-60%, mod conc LVH; mild MR with mildly thickened leaflets; mild TR; PA peak pressure 60mmHg  . Nm myocar perf wall motion  01/2013    lexiscan - no pharmacologically induced ischemia, fixed defects in apical segments of anterior septum & inferior lateral wall    Family History  Problem Relation Age of Onset  . Stroke Mother   . Diabetes Father   . Heart disease Father     CABG  . Arthritis Father     C6-7  . Skin cancer Father     SCC  . Arthritis Brother     s/p THR   Social History  Substance Use Topics  . Smoking status: Former Smoker -- 0.50 packs/day for 36 years    Types: Cigarettes    Start date: 03/15/1963    Quit date: 01/08/2005  . Smokeless tobacco: Never Used  . Alcohol Use: No   OB History    No data available     Review of Systems  Constitutional: Negative for fever, diaphoresis, appetite change, fatigue and unexpected weight change.  HENT: Negative for mouth sores.   Eyes: Negative for visual  disturbance.  Respiratory: Negative for cough, chest tightness, shortness of breath and wheezing.   Gastrointestinal: Negative for nausea, vomiting, abdominal pain, diarrhea and constipation.  Endocrine: Negative for polydipsia, polyphagia and polyuria.  Genitourinary: Negative for dysuria, urgency, frequency and hematuria.  Musculoskeletal: Positive for neck pain. Negative for back pain and neck stiffness.  Skin: Negative for rash.  Allergic/Immunologic: Negative for immunocompromised state.  Neurological: Negative for syncope, light-headedness and headaches.  Hematological: Does not bruise/bleed easily.  Psychiatric/Behavioral: Negative for sleep disturbance. The patient is not nervous/anxious.       Allergies  Adhesive; Erythromycin; Naproxen;  and Penicillins  Home Medications   Prior to Admission medications   Medication Sig Start Date End Date Taking? Authorizing Provider  aspirin 325 MG tablet Take 325 mg by mouth daily.    Historical Provider, MD  cyclobenzaprine (FLEXERIL) 10 MG tablet Take 1 tablet (10 mg total) by mouth 3 (three) times daily as needed for muscle spasms. 04/17/15   Chelle Jeffery, PA-C  estradiol (ESTRACE) 1 MG tablet Take 1 tablet (1 mg total) by mouth daily. 09/28/14   Thao P Le, DO  estradiol (ESTRACE) 1 MG tablet TAKE ONE TABLET BY MOUTH ONCE DAILY. 06/14/15   Mancel Bale, PA-C  fexofenadine (ALLEGRA) 180 MG tablet Take 180 mg by mouth daily.    Historical Provider, MD  fish oil-omega-3 fatty acids 1000 MG capsule Take 1 g by mouth 3 (three) times daily.     Historical Provider, MD  fluticasone (FLONASE) 50 MCG/ACT nasal spray USE 2 SPRAYS IN EACH NOSTRIL ONCE DAILY 09/28/14   Thao P Le, DO  loratadine (CLARITIN) 10 MG tablet Take 10 mg by mouth daily.    Historical Provider, MD  metoprolol tartrate (LOPRESSOR) 25 MG tablet TAKE 1/2 TABLET BY MOUTH TWICE DAILY 07/13/15   Mancel Bale, PA-C  pravastatin (PRAVACHOL) 40 MG tablet TAKE 1 TABLET BY MOUTH ONCE DAILY 09/28/14   Thao P Le, DO  ranitidine (ZANTAC) 150 MG tablet Take 150 mg by mouth daily with supper.     Historical Provider, MD  sertraline (ZOLOFT) 25 MG tablet Take 1 tablet (25 mg total) by mouth daily. 09/28/14   Thao P Le, DO   BP 110/38 mmHg  Pulse 83  Temp(Src) 98.6 F (37 C) (Oral)  Resp 15  Ht 5\' 4"  (1.626 m)  Wt 165 lb (74.844 kg)  BMI 28.31 kg/m2  SpO2 99% Physical Exam  Constitutional: She is oriented to person, place, and time. She appears well-developed and well-nourished. No distress.  Awake, alert, nontoxic appearance  HENT:  Head: Normocephalic and atraumatic.  Right Ear: Tympanic membrane, external ear and ear canal normal.  Left Ear: Tympanic membrane, external ear and ear canal normal.  Nose: Nose normal. No epistaxis.  Right sinus exhibits no maxillary sinus tenderness and no frontal sinus tenderness. Left sinus exhibits no maxillary sinus tenderness and no frontal sinus tenderness.  Mouth/Throat: Uvula is midline, oropharynx is clear and moist and mucous membranes are normal. Mucous membranes are not pale and not cyanotic. No oropharyngeal exudate, posterior oropharyngeal edema, posterior oropharyngeal erythema or tonsillar abscesses.  Eyes: Conjunctivae are normal. Pupils are equal, round, and reactive to light. No scleral icterus.  Neck: Normal range of motion and full passive range of motion without pain. Neck supple.  Cardiovascular: Normal heart sounds and intact distal pulses.  An irregularly irregular rhythm present. Tachycardia present.   Pulses:      Radial pulses are 2+ on the right side,  and 2+ on the left side.       Dorsalis pedis pulses are 2+ on the right side, and 2+ on the left side.  Pulmonary/Chest: Effort normal and breath sounds normal. No stridor. No respiratory distress. She has no wheezes.  Equal chest expansion  Abdominal: Soft. Bowel sounds are normal. She exhibits no mass. There is no tenderness. There is no rebound and no guarding.  Musculoskeletal: Normal range of motion. She exhibits no edema.  Lymphadenopathy:    She has no cervical adenopathy.  Neurological: She is alert and oriented to person, place, and time.  Speech is clear and goal oriented Moves extremities without ataxia  Skin: Skin is warm and dry. No rash noted. She is not diaphoretic.  Psychiatric: She has a normal mood and affect.  Nursing note and vitals reviewed.   ED Course  Procedures (including critical care time) Labs Review Labs Reviewed  BASIC METABOLIC PANEL - Abnormal; Notable for the following:    Potassium 5.6 (*)    Glucose, Bld 151 (*)    All other components within normal limits  CBC WITH DIFFERENTIAL/PLATELET  HEPARIN LEVEL (UNFRACTIONATED)  I-STAT TROPOININ, ED    Imaging Review Ct  Angio Neck W/cm &/or Wo/cm  08/31/2015   CLINICAL DATA:  69 year old female with onset of right neck pain extending into right jaw. No dizziness. Atrial fibrillation. Initial encounter.  EXAM: CT ANGIOGRAPHY NECK  TECHNIQUE: Multidetector CT imaging of the neck was performed using the standard protocol during bolus administration of intravenous contrast. Multiplanar CT image reconstructions and MIPs were obtained to evaluate the vascular anatomy. Carotid stenosis measurements (when applicable) are obtained utilizing NASCET criteria, using the distal internal carotid diameter as the denominator.  CONTRAST:  87mL OMNIPAQUE IOHEXOL 350 MG/ML SOLN  COMPARISON:  None.  FINDINGS: Aortic arch: 3 vessel aortic arch.  Right carotid system: No evidence of dissection. Right carotid bifurcation plaque with less than 50% diameter stenosis. Tortuous right internal carotid artery cervical segment with prominent fold causing mild to moderate focal narrowing.  Left carotid system: No evidence of dissection. No hemodynamically significant stenosis. Mild ectasia cervical segment left internal carotid artery.  Vertebral arteries:Plaque and fold with mild to moderate narrowing proximal right subclavian artery. No evidence of right vertebral artery dissection. Ectatic proximal right vertebral artery. Ectatic proximal left vertebral artery. No significant stenosis of the left vertebral artery. Slight indentation upon the left vertebral artery C6-7 level secondary to lateral osteophyte.  Skeleton: Cervical spondylotic changes with various degrees of spinal stenosis and foraminal narrowing C3-4 thru C7-T1. Findings most prominent C5-6 and C6-7 level.  Other neck: No worrisome primary neck mass. No adenopathy. Scarring left lung apex.  IMPRESSION: No evidence of carotid or vertebral artery dissection.  Right carotid bifurcation plaque with less than 50% diameter stenosis. Tortuous right internal carotid artery cervical segment with  prominent fold causing mild to moderate focal narrowing.  Mild ectasia cervical segment left internal carotid artery.  Plaque and fold with mild to moderate narrowing proximal right subclavian artery.  Ectatic proximal right vertebral artery. Ectatic proximal left vertebral artery.  Cervical spondylotic changes with various degrees of spinal stenosis and foraminal narrowing C3-4 thru C7-T1. Findings most prominent C5-6 and C6-7 level.   Electronically Signed   By: Genia Del M.D.   On: 08/31/2015 13:35   I have personally reviewed and evaluated these images and lab results as part of my medical decision-making.   EKG Interpretation   Date/Time:  Saturday August 31 2015  10:49:56 EDT Ventricular Rate:  139 PR Interval:    QRS Duration: 66 QT Interval:  253 QTC Calculation: 385 R Axis:   67 Text Interpretation:  Atrial fibrillation Low voltage, precordial leads  Repol abnrm suggests ischemia, diffuse leads with rapid ventricular  response Confirmed by Alvino Chapel  MD, Ovid Curd [phone]) on 08/31/2015 11:15:24  AM       CRITICAL CARE Performed by: Abigail Butts Total critical care time: 15min Critical care time was exclusive of separately billable procedures and treating other patients. Critical care was necessary to treat or prevent imminent or life-threatening deterioration. Critical care was time spent personally by me on the following activities: development of treatment plan with patient and/or surrogate as well as nursing, discussions with consultants, evaluation of patient's response to treatment, examination of patient, obtaining history from patient or surrogate, ordering and performing treatments and interventions, ordering and review of laboratory studies, ordering and review of radiographic studies, pulse oximetry and re-evaluation of patient's condition.   MDM   Final diagnoses:  Paroxysmal atrial fibrillation (HCC)  Unstable angina  Atrial fibrillation with rapid  ventricular response   [name] presents with "not feeling right" and right sided jaw pain.  Patient found to be in A. fib with RVR. I would consider this an anginal equivalent. ChadsVasc score 2 will begin anticoagulation along with Cardizem bolus and drip.  11:54 AM Discussed with Dr. Rayann Heman who will evaluate and plan for admission.    12:45 PM Patient with improved rate control. No hypotension.  Patient also with improvement in her right-sided neck pain. Will continue with CT angiogram to rule out dissection. Patient admitted by cardiology.  2:05 PM Angiogram of the neck without evidence of carotid or vertebral dissection.  Patient remains rate controlled however persistent A. Fib.  Blood pressure remains steady.  BP 110/38 mmHg  Pulse 83  Temp(Src) 98.6 F (37 C) (Oral)  Resp 15  Ht 5\' 4"  (1.626 m)  Wt 165 lb (74.844 kg)  BMI 28.31 kg/m2  SpO2 99%   Abigail Butts, PA-C 08/31/15 1406  Davonna Belling, MD 09/02/15 [phone]

## 2015-08-31 NOTE — ED Notes (Signed)
Phlebotomy at the bedside  

## 2015-08-31 NOTE — ED Notes (Signed)
MD Allred paged to ask about patient care.

## 2015-08-31 NOTE — ED Notes (Signed)
MD at the bedside  

## 2015-08-31 NOTE — ED Notes (Signed)
Called CT. Patient next.

## 2015-08-31 NOTE — H&P (Signed)
Daisy Nelson is an 69 y.o. female.    Primary Cardiologist:Dr. Debara Pickett  PCP: JEFFERY,CHELLE, PA-C  Chief Complaint: pain in her jaw.   HPI:  69 year old female presented to ER with jaw and neck pain.  She has no prior history of CAD or AF, similar presentation in past for a fib in 2014.  Nuc study at that time was neg for ischemia though it noted fixed defects involving the apical segments of the anterior septum and the inferior lateral wall.  Echo  With EF 55-60% mild MR, PA pk pressure 56mHg,    Today she was waken from sleep with rt jaw and neck pain. "it felt like something popped"  The pain lasted about 5 min. No associated SOB.  No longer on Xarelto- stopped in 2014.  Now on asa.  She has felt a few flutters lasting seconds but nothing like what woke her up.  No nausea.  HR on arrival on EKG 139.  She has 3 cups or glasses of caffeine daily, no pseudoephedrine.  Only on lopressor at 12.5 BID due to HR in the 40s SB.    Her troponin 0.00, EKG with a fib and RVR and diffuse ST depression inf. Lat.--though this is similar to EKGs in the past.  K+ elevated at 5.6,  Currently comfortable with HR in a fib in the 70s.  On IV heparin and IV dilt.    Past Medical History  Diagnosis Date  . GERD (gastroesophageal reflux disease)   . Allergy   . Hyperlipidemia   . Depression   . Anxiety   . DJD (degenerative joint disease) of cervical spine   . Nephrolithiasis   . Hyperglycemia   . Colon polyps   . [place]     AF with RVR 01/2013  . Family history of CABG     Past Surgical History  Procedure Laterality Date  . Tubal ligation  1977  . Cesarean section  1976  . Abdominal hysterectomy  1991  . Laparoscopy  1976  . Ganglion cyst excision      Left hand  . Cataract extraction, bilateral Bilateral 03/2013  . Transthoracic echocardiogram  01/2013    EF 55-60%, mod conc LVH; mild MR with mildly thickened leaflets; mild TR; PA peak pressure 344mg  . Nm  myocar perf wall motion  01/2013    lexiscan - no pharmacologically induced ischemia, fixed defects in apical segments of anterior septum & inferior lateral wall     Family History  Problem Relation Age of Onset  . Stroke Mother   . Diabetes Father   . Heart disease Father     CABG  . Arthritis Father     C6-7  . Skin cancer Father     SCC  . Arthritis Brother     s/p THR   Social History:  reports that she quit smoking about 10 years ago. Her smoking use included Cigarettes. She started smoking about 52 years ago. She has a 18 pack-year smoking history. She has never used smokeless tobacco. She reports that she does not drink alcohol or use illicit drugs.  Allergies:  Allergies  Allergen Reactions  . Adhesive [Tape]     Burn skin  . Erythromycin     Gi intolerance  . Naproxen Nausea And Vomiting    Abdominal pain   . Penicillins Hives    OUTPATIENT MEDICATIONS: No current facility-administered medications on file prior to encounter.  Current Outpatient Prescriptions on File Prior to Encounter  Medication Sig Dispense Refill  . aspirin 325 MG tablet Take 325 mg by mouth daily.    Marland Kitchen estradiol (ESTRACE) 1 MG tablet Take 1 tablet (1 mg total) by mouth daily. 90 tablet 3  . estradiol (ESTRACE) 1 MG tablet TAKE ONE TABLET BY MOUTH ONCE DAILY. 90 tablet 0  . fexofenadine (ALLEGRA) 180 MG tablet Take 180 mg by mouth daily.    . fish oil-omega-3 fatty acids 1000 MG capsule Take 1 g by mouth 3 (three) times daily.     Marland Kitchen loratadine (CLARITIN) 10 MG tablet Take 10 mg by mouth daily.    . metoprolol tartrate (LOPRESSOR) 25 MG tablet TAKE 1/2 TABLET BY MOUTH TWICE DAILY (Patient taking differently: Take 12.5 mg by mouth 2 (two) times daily. TAKE 1/2 TABLET BY MOUTH TWICE DAILY) 90 tablet 0  . pravastatin (PRAVACHOL) 40 MG tablet TAKE 1 TABLET BY MOUTH ONCE DAILY (Patient taking differently: Take 40 mg by mouth daily. TAKE 1 TABLET BY MOUTH ONCE DAILY) 90 tablet 3  . ranitidine (ZANTAC)  150 MG tablet Take 150 mg by mouth daily with supper.     . sertraline (ZOLOFT) 25 MG tablet Take 1 tablet (25 mg total) by mouth daily. 90 tablet 3  . cyclobenzaprine (FLEXERIL) 10 MG tablet Take 1 tablet (10 mg total) by mouth 3 (three) times daily as needed for muscle spasms. 30 tablet 0  . fluticasone (FLONASE) 50 MCG/ACT nasal spray USE 2 SPRAYS IN EACH NOSTRIL ONCE DAILY 16 g 11     Results for orders placed or performed during the hospital encounter of 08/31/15 (from the past 48 hour(s))  Basic metabolic panel     Status: Abnormal   Collection Time: 08/31/15 11:03 AM  Result Value Ref Range   Sodium 138 135 - 145 mmol/L   Potassium 5.6 (H) 3.5 - 5.1 mmol/L   Chloride 106 101 - 111 mmol/L   CO2 23 22 - 32 mmol/L   Glucose, Bld 151 (H) 65 - 99 mg/dL   BUN 13 6 - 20 mg/dL   Creatinine, Ser 0.79 0.44 - 1.00 mg/dL   Calcium 9.2 8.9 - 10.3 mg/dL   GFR calc non Af Amer >60 >60 mL/min   GFR calc Af Amer >60 >60 mL/min    Comment: (NOTE) The eGFR has been calculated using the CKD EPI equation. This calculation has not been validated in all clinical situations. eGFR's persistently <60 mL/min signify possible Chronic Kidney Disease.    Anion gap 9 5 - 15  I-stat troponin, ED     Status: None   Collection Time: 08/31/15 11:37 AM  Result Value Ref Range   Troponin i, poc 0.00 0.00 - 0.08 ng/mL   Comment 3            Comment: Due to the release kinetics of cTnI, a negative result within the first hours of the onset of symptoms does not rule out myocardial infarction with certainty. If myocardial infarction is still suspected, repeat the test at appropriate intervals.   CBC with Differential     Status: None   Collection Time: 08/31/15 12:13 PM  Result Value Ref Range   WBC 5.6 4.0 - 10.5 K/uL   RBC 4.56 3.87 - 5.11 MIL/uL   Hemoglobin 13.8 12.0 - 15.0 g/dL   HCT 41.2 36.0 - 46.0 %   MCV 90.4 78.0 - 100.0 fL   MCH 30.3 26.0 - 34.0 pg  MCHC 33.5 30.0 - 36.0 g/dL   RDW 13.5  11.5 - 15.5 %   Platelets 177 150 - 400 K/uL   Neutrophils Relative % 59 %   Neutro Abs 3.3 1.7 - 7.7 K/uL   Lymphocytes Relative 23 %   Lymphs Abs 1.3 0.7 - 4.0 K/uL   Monocytes Relative 16 %   Monocytes Absolute 0.9 0.1 - 1.0 K/uL   Eosinophils Relative 1 %   Eosinophils Absolute 0.1 0.0 - 0.7 K/uL   Basophils Relative 1 %   Basophils Absolute 0.0 0.0 - 0.1 K/uL   Ct Angio Neck W/cm &/or Wo/cm  08/31/2015   CLINICAL DATA:  69 year old female with onset of right neck pain extending into right jaw. No dizziness. Atrial fibrillation. Initial encounter.  EXAM: CT ANGIOGRAPHY NECK  TECHNIQUE: Multidetector CT imaging of the neck was performed using the standard protocol during bolus administration of intravenous contrast. Multiplanar CT image reconstructions and MIPs were obtained to evaluate the vascular anatomy. Carotid stenosis measurements (when applicable) are obtained utilizing NASCET criteria, using the distal internal carotid diameter as the denominator.  CONTRAST:  49m OMNIPAQUE IOHEXOL 350 MG/ML SOLN  COMPARISON:  None.  FINDINGS: Aortic arch: 3 vessel aortic arch.  Right carotid system: No evidence of dissection. Right carotid bifurcation plaque with less than 50% diameter stenosis. Tortuous right internal carotid artery cervical segment with prominent fold causing mild to moderate focal narrowing.  Left carotid system: No evidence of dissection. No hemodynamically significant stenosis. Mild ectasia cervical segment left internal carotid artery.  Vertebral arteries:Plaque and fold with mild to moderate narrowing proximal right subclavian artery. No evidence of right vertebral artery dissection. Ectatic proximal right vertebral artery. Ectatic proximal left vertebral artery. No significant stenosis of the left vertebral artery. Slight indentation upon the left vertebral artery C6-7 level secondary to lateral osteophyte.  Skeleton: Cervical spondylotic changes with various degrees of spinal  stenosis and foraminal narrowing C3-4 thru C7-T1. Findings most prominent C5-6 and C6-7 level.  Other neck: No worrisome primary neck mass. No adenopathy. Scarring left lung apex.  IMPRESSION: No evidence of carotid or vertebral artery dissection.  Right carotid bifurcation plaque with less than 50% diameter stenosis. Tortuous right internal carotid artery cervical segment with prominent fold causing mild to moderate focal narrowing.  Mild ectasia cervical segment left internal carotid artery.  Plaque and fold with mild to moderate narrowing proximal right subclavian artery.  Ectatic proximal right vertebral artery. Ectatic proximal left vertebral artery.  Cervical spondylotic changes with various degrees of spinal stenosis and foraminal narrowing C3-4 thru C7-T1. Findings most prominent C5-6 and C6-7 level.   Electronically Signed   By: SGenia DelM.D.   On: 08/31/2015 13:35    ROS: General:no colds or fevers, no weight changes Skin:no rashes or ulcers HEENT:no blurred vision, no congestion CV:see HPI PUL:see HPI GI:no diarrhea constipation or melena, no indigestion GU:no hematuria, no dysuria MS:no joint pain, no claudication Neuro:no syncope, no lightheadedness Endo:no diabetes, no thyroid disease   Blood pressure 103/58, pulse 72, temperature 98.6 F (37 C), temperature source Oral, resp. rate 15, height '5\' 4"'  (1.626 m), weight 165 lb (74.844 kg), SpO2 97 %. PE: General:Pleasant affect, NAD Skin:Warm and dry, brisk capillary refill HEENT:normocephalic, sclera clear, mucus membranes moist Neck:supple, no JVD, no bruits  Heart:irreg irreg without murmur, gallup, rub or click Lungs:clear without rales, rhonchi, or wheezes AJEH:UDJS non tender, + BS, do not palpate liver spleen or masses Ext:no lower ext edema, 2+ pedal pulses, 2+  radial pulses Neuro:alert and oriented X 3, MAE, follows commands, + facial symmetry    Assessment/Plan Principal Problem:   Atrial fibrillation with RVR  (HCC)  Now rate controlled on IV dilt and is also on iv hep. CHA2DS2VASc score of 2   On dilt HR dropping to the 50s on 2.5 mg/hr will stop dilt.  She had Loletha Grayer with HR in 40s on lopressor 25 BID she tolerates the 12.5 bid with HR around 51.  When Dr. Rayann Heman saw pt she had converted to SB.  Plan will be to begin xarelto 20 mg daily, 30 day free card given.  She will follow up in a fib clinic this week.    Active Problems:   Jaw pain- most likely due to RVR, but will follow troponins, first one is negative.     Hyperlipidemia-continue statin.   MVP (mitral valve prolapse)   PAF (paroxysmal atrial fibrillation)    EFUWTK,TCCEQ R Nurse Practitioner Certified [place] Pager [phone] or after 5pm or weekends call [phone] 08/31/2015, 3:48 PM  I have seen, examined the patient, and reviewed the above assessment and plan.  On exam pt is comfortable with RRR.  Changes to above are made where necessary.  Pt now in sinus rhythm.  AF is infrequent.  Chads2vasc score is 2.  Will therefore stop heparin and place on xarelto as above.  DC to home.  Follow-up in AF clinic this week.  Co Sign: Thompson Grayer, MD 08/31/2015 5:13 PM

## 2015-08-31 NOTE — ED Notes (Signed)
Spoke with Cardiology. Patient is to stop Aspirin and continue Xarelto.

## 2015-08-31 NOTE — ED Notes (Signed)
Called Pharmacy to get Heparin.

## 2015-08-31 NOTE — ED Notes (Signed)
NP Cardiology at the bedside.

## 2015-08-31 NOTE — Progress Notes (Addendum)
ANTICOAGULATION CONSULT NOTE - Initial Consult  Pharmacy Consult for heparin Indication: atrial fibrillation  Allergies  Allergen Reactions  . Adhesive [Tape]     Burn skin  . Erythromycin     Gi intolerance  . Naproxen Nausea And Vomiting    Abdominal pain   . Penicillins Hives    Patient Measurements: Height: 5\' 4"  (162.6 cm) Weight: 165 lb (74.844 kg) IBW/kg (Calculated) : 54.7 Heparin Dosing Weight: 70kg  Vital Signs: Temp: 98.6 F (37 C) (10/01 1059) Temp Source: Oral (10/01 1059) BP: 150/73 mmHg (10/01 1101) Pulse Rate: 80 (10/01 1122)  Labs: No results for input(s): HGB, HCT, PLT, APTT, LABPROT, INR, HEPARINUNFRC, CREATININE, CKTOTAL, CKMB, TROPONINI in the last 72 hours.  CrCl cannot be calculated (Patient has no serum creatinine result on file.).   Assessment: 33 YOF who presents to the ED with 10/10 lower right jaw and neck pain. She has a history of AFib with RVR, not on anticoagulation. She does take ASA 325mg . Per Dr. Lysbeth Penner note from 12/2014, she has low CHADSVASC and ASA only was decision made at that time  Baseline labs have been collected, but have not yet resulted.  Goal of Therapy:  Heparin level 0.3-0.7 units/ml Monitor platelets by anticoagulation protocol: Yes   Plan:  -heparin 3000 units IV x1 as loading dose, then start infusion at 900 units/hr -heparin level in 6 hours -daily HL and CBC -follow for s/s bleeding -follow cardiology workup  Lauren D. Bajbus, PharmD, BCPS Clinical Pharmacist Pager: [phone] 08/31/2015 11:36 AM  Addendum: Pt. to be discharged.  Provide changed coverage to rivaroxaban.  Pt. with stable H/H and platelets.     Plan: Rivaroxaban 20mg  daily  Rober Minion, PharmD., MS Clinical Pharmacist Pager:  [phone] Thank you for allowing pharmacy to be part of this patients care team.

## 2015-08-31 NOTE — Discharge Instructions (Signed)
Anticoagulation, Generic  Anticoagulants are medicines used to prevent clots from developing in your veins. These medicine are also known as blood thinners. If blood clots are untreated, they could travel to your lungs. This is called a pulmonary embolus. A blood clot in your lungs can be fatal.   Health care providers often use anticoagulants to prevent clots following surgery. Anticoagulants are also used along with aspirin when the heart is not getting enough blood.  Another anticoagulant called warfarin is started 2 to 3 days after a rapid-acting injectable anticoagulant is started. The rapid-acting anticoagulants are usually continued until warfarin has begun to work. Your health care provider will judge this length of time by blood tests known as the prothrombin time (PT) and International Normalization Ratio (INR). This means that your blood is at the necessary and best level to prevent clots.  RISKS AND COMPLICATIONS  · If you have received recent epidural anesthesia, spinal anesthesia, or a spinal tap while receiving anticoagulants, you are at risk for developing a blood clot in or around the spine. This condition could result in long-term or permanent paralysis.  · Because anticoagulants thin your blood, severe bleeding may occur from any tissue or organ. Symptoms of the blood being too thin may include:  ¨ Bleeding from the nose or gums that does not stop quickly.  ¨ Blood in bowel movements which may appear as bright red, dark, or black tarry stools.  ¨ Blood in the urine which may appear as pink, red, or brown urine.  ¨ Unusual bruising or bruising easily.  ¨ A cut that does not stop bleeding within 10 minutes.  ¨ Vomiting blood or continuous nausea for more than 1 day.  ¨ Coughing up blood.  ¨ Broken blood vessels in your eye (subconjunctival hemorrhage).  ¨ Abdominal or back pain with or without flank bruising.  ¨ Sudden, severe headache.  ¨ Sudden weakness or numbness of the face, arm, or leg,  especially on one side of the body.  ¨ Sudden confusion.  ¨ Trouble speaking (aphasia) or understanding.  ¨ Sudden trouble seeing in one or both eyes.  ¨ Sudden trouble walking.  ¨ Dizziness.  ¨ Loss of balance or coordination.  ¨ Vaginal bleeding.  ¨ Swelling or pain at an injection site.  ¨ Superficial fat tissue death (necrosis) which may cause skin scarring. This is more common in women and may first present as pain in the waist, thighs, or buttocks.  ¨ Fever.  · Too little anticoagulation continues to allow the risk for blood clots.  HOME CARE INSTRUCTIONS   · Due to the complications of anticoagulants, it is very important that you take your anticoagulant as directed by your health care provider. Anticoagulants need to be taken exactly as instructed. Be sure you understand all your anticoagulant instructions.  · Keep all follow-up appointments with your health care provider as directed. It is very important to keep your appointments. Not keeping appointments could result in a chronic or permanent injury, pain, or disability.  · Warfarin. Your health care provider will advise you on the length of treatment (usually 3-6 months, sometimes lifelong).  ¨ Take warfarin exactly as directed by your health care provider. It is recommended that you take your warfarin dose at the same time of the day. It is preferred that you take warfarin in the late afternoon. If you have been told to stop taking warfarin, do not resume taking warfarin until directed to do so by your health care   provider. Follow your health care provider's instructions if you accidentally take an extra dose or miss a dose of warfarin. It is very important to take warfarin as directed since bleeding or blood clots could result in chronic or permanent injury, pain, or disability.  ¨ Too much and too little warfarin are both dangerous. Too much warfarin increases the risk of bleeding. Too little warfarin continues to allow the risk for blood clots. While  taking warfarin, you will need to have regular blood tests to measure your blood clotting time. These blood tests usually include both the prothrombin time (PT) and International Normalized Ratio (INR) tests. The PT and INR results allow your health care provider to adjust your dose of warfarin. The dose can change for many reasons. It is critically important that you have your PT and INR levels drawn exactly as directed. Your warfarin dose may stay the same or change depending on what the PT and INR results are. Be sure to follow up with your health care provider regarding your PT and INR test results and what your warfarin dosage should be.  ¨ Many medicines can interfere with warfarin and affect the PT and INR results. You must tell your health care provider about any and all medicines you take, this includes all vitamins and supplements. Ask your health care provider before taking these. Prescription and over-the-counter medicine consistency is critical to warfarin management. It is important that potential interactions are checked before you start a new medicine. Be especially cautious with aspirin and anti-inflammatory medicines. Ask your health care provider before taking these. Medicines such as antibiotics and acid-reducing medicine can interact with warfarin and can cause an increased warfarin effect. Warfarin can also interfere with the effectiveness of medicines you are taking. Do not take or discontinue any prescribed or over-the-counter medicine except on the advice of your health care provider or pharmacist.  ¨ Some vitamins, supplements, and herbal products interfere with the effectiveness of warfarin. Vitamin E may increase the anticoagulant effects of warfarin. Vitamin K may can cause warfarin to be less effective. Do not take or discontinue any vitamin, supplement, or herbal product except on the advice of your health care provider or pharmacist.  ¨ Eat what you normally eat and keep the vitamin K  content of your diet consistent. Avoid major changes in your diet, or notify your health care provider before changing your diet. Suddenly getting a lot more vitamin K could cause your blood to clot too quickly. A sudden decrease in vitamin K intake could cause your blood to clot too slowly. These changes in vitamin K intake could lead to dangerous blood clots or to bleeding. To keep your vitamin K intake consistent, you must be aware of which foods contain moderate or high amounts of vitamin K. Some foods high in vitamin K include spinach, kale, broccoli, cabbage, greens, Brussels sprouts, asparagus, Bok [place]oy, coleslaw, parsley, and green tea. Arrange a visit with a dietitian to answer your questions.  ¨ If you have a loss of appetite or get the stomach flu (viral gastroenteritis), talk to your health care provider as soon as possible. A decrease in your normal vitamin K intake can make you more sensitive to your usual dose of warfarin.  ¨ Some medical conditions may increase your risk for bleeding while you are taking warfarin. A fever, diarrhea lasting more than a day, worsening heart failure, or worsening liver function are some medical conditions that could affect warfarin. Contact your health care provider if   you have any of these medical conditions.  ¨ Alcohol can change the body's ability to handle warfarin. It is best to avoid alcoholic drinks or consume only very small amounts while taking warfarin. Notify your health care provider if you change your alcohol intake. A sudden increase in alcohol use can increase your risk of bleeding. Chronic alcohol use can cause warfarin to be less effective.  · Be careful not to cut yourself when using sharp objects or while shaving.  · Inform all your health care providers and your dentist that you take an anticoagulant.  · Limit physical activities or sports that could result in a fall or cause injury. Avoid contact sports.  · Wear medical alert jewelry or carry a  medical alert card.  SEEK IMMEDIATE MEDICAL CARE IF:  · You cough up blood.  · You have dark or black stools or there is bright red blood coming from your rectum.  · You vomit blood or have nausea for more than 1 day.  · You have blood in the urine or pink colored urine.  · You have unusual bruising or have increased bruising.  · You have bleeding from the nose or gums that does not stop quickly.  · You have a cut that does not stop bleeding within a 2-3 minutes.  · You have sudden weakness or numbness of the face, arm, or leg, especially on one side of the body.  · You have sudden confusion.  · You have trouble speaking (aphasia) or understanding.  · You have sudden trouble seeing in one or both eyes.  · You have sudden trouble walking.  · You have dizziness.  · You have a loss of balance or coordination.  · You have a sudden, severe headache.  · You have a serious fall or head injury, even if you are not bleeding.  · You have swelling or pain at an injection site.  · You have unexplained tenderness or pain in the abdomen, back, waist, thighs or buttocks.  · You have a fever.  Any of these symptoms may represent a serious problem that is an emergency. Do not wait to see if the symptoms will go away. Get medical help right away. Call your local emergency services (911 in [place] Do not drive yourself to the hospital.  Document Released: 11/16/2005 Document Revised: 11/21/2013 Document Reviewed: 06/20/2008  ExitCare® Patient Information ©2015 [employer]. This information is not intended to replace advice given to you by your health care provider. Make sure you discuss any questions you have with your health care provider.

## 2015-08-31 NOTE — ED Notes (Signed)
Pharmacy called for [facility].

## 2015-08-31 NOTE — ED Notes (Signed)
Pt returned from CT °

## 2015-08-31 NOTE — ED Notes (Signed)
Pt reports waking in the middle of the night with a 10 out of 10 right lower jaw/neck pain.  Pt reports only mild pain currently.  Pt reports almost felt like 'popping'.  Pt reports Hx of afib.  Pt states: "sometimes I have flutters".  Pt reports taking night time daily dose of 325 mg aspirin.  Pt report felling like there is something in her neck that "just doesn't feel right".  Pt states: "I just don't feel right".  Pt denies pain in chest.  Pt denies: headache, vision changes, LOC.

## 2015-09-02 ENCOUNTER — Telehealth: Payer: Self-pay | Admitting: Internal Medicine

## 2015-09-02 NOTE — Telephone Encounter (Signed)
Returned call to patient.  She was eval'd in ER for episode of jaw pain on Saturday.  D/c'ed home & put on Xarelto started pack.  She received discharge instructions stating that Roderic Palau will call her to set up follow up visit in [facility] clinic.  Patient has contact information for [facility] clinic. Advised to call there later in the week if she has not heard from them.  Pt voiced understanding of instructions and med changes.   Sent to Dr. Debara Pickett as Juluis Rainier.

## 2015-09-02 NOTE — Telephone Encounter (Signed)
Pt called in stating that she went to the hospital on 10/1 for her Afib and she was instructed to call our office to notify the doctor that her meds had been changed . She was taken off her aspirin and was put on Xarelto . Please f/u with her if you need to  Thanks

## 2015-09-03 ENCOUNTER — Ambulatory Visit (HOSPITAL_COMMUNITY)
Admission: RE | Admit: 2015-09-03 | Discharge: 2015-09-03 | Disposition: A | Discharge status: Home / Self Care | Payer: 59 | Source: Ambulatory Visit | Attending: Nurse Practitioner | Admitting: Nurse Practitioner

## 2015-09-03 VITALS — BP 124/60 | HR 57 | Ht 65.0 in | Wt 171.4 lb

## 2015-09-03 DIAGNOSIS — K219 Gastro-esophageal reflux disease without esophagitis: Secondary | ICD-10-CM | POA: Insufficient documentation

## 2015-09-03 DIAGNOSIS — Z8249 Family history of ischemic heart disease and other diseases of the circulatory system: Secondary | ICD-10-CM | POA: Insufficient documentation

## 2015-09-03 DIAGNOSIS — Z79899 Other long term (current) drug therapy: Secondary | ICD-10-CM | POA: Diagnosis not present

## 2015-09-03 DIAGNOSIS — I480 Paroxysmal atrial fibrillation: Secondary | ICD-10-CM | POA: Diagnosis present

## 2015-09-03 DIAGNOSIS — F329 Major depressive disorder, single episode, unspecified: Secondary | ICD-10-CM | POA: Diagnosis not present

## 2015-09-03 DIAGNOSIS — E785 Hyperlipidemia, unspecified: Secondary | ICD-10-CM | POA: Insufficient documentation

## 2015-09-03 DIAGNOSIS — Z87891 Personal history of nicotine dependence: Secondary | ICD-10-CM | POA: Insufficient documentation

## 2015-09-03 DIAGNOSIS — Z880 Allergy status to penicillin: Secondary | ICD-10-CM | POA: Insufficient documentation

## 2015-09-03 DIAGNOSIS — F419 Anxiety disorder, unspecified: Secondary | ICD-10-CM | POA: Diagnosis not present

## 2015-09-03 DIAGNOSIS — Z823 Family history of stroke: Secondary | ICD-10-CM | POA: Diagnosis not present

## 2015-09-03 DIAGNOSIS — Z833 Family history of diabetes mellitus: Secondary | ICD-10-CM | POA: Insufficient documentation

## 2015-09-03 MED ORDER — FLECAINIDE ACETATE 100 MG PO TABS: ORAL_TABLET | ORAL | Status: DC

## 2015-09-03 NOTE — Patient Instructions (Signed)
Your physician has recommended you make the following change in your medication:  1)Flecainide 200mg  (take 2 - 100mg  tablet) with onset of afib.  May only take once a week 2)Xarelto 20mg  once a day with supper.  Follow up 1 month -- parking code for nov 9000

## 2015-09-04 ENCOUNTER — Encounter (HOSPITAL_COMMUNITY): Payer: Self-pay | Admitting: Nurse Practitioner

## 2015-09-04 NOTE — Progress Notes (Signed)
Patient ID: Daisy Nelson, female   DOB: January 15, 1946, 69 y.o.   MRN: [id number]     Primary Care Physician: Wynne Dust Referring Physician: [facility]   Daisy Nelson is a 69 y.o. female with a h/o PAF with last breakthrough afib in 2014. She presented to [facility] 10/1 with afib that started abruptly. She states that she ate [place] food and drank a margarita the night before and usually does not drink alcohol. She converted with cardizem drip in the ER and discharged home.  She is for f/u in the afib clinic. She was started on xarelto for chadsvasc score of 2(female HTN) . She however was sent home with a xarelto starter pack and is currently on 15 mg a day, which is not the correct dose for her. She will start in am 20 mg a day. She has been on metoprolol in the past and will continue. She has had no further issues with fast heart rate.  Today, she denies symptoms of palpitations, chest pain, shortness of breath, orthopnea, PND, lower extremity edema, dizziness, presyncope, syncope, or neurologic sequela. The patient is tolerating medications without difficulties and is otherwise without complaint today.   Past Medical History  Diagnosis Date  . GERD (gastroesophageal reflux disease)   . Allergy   . Hyperlipidemia   . Depression   . Anxiety   . DJD (degenerative joint disease) of cervical spine   . Nephrolithiasis   . Hyperglycemia   . Colon polyps   . AF (atrial fibrillation) ([place])     AF with RVR 01/2013  . Family history of CABG   . Atrial fibrillation with RVR ([place]) 08/31/2015  . Jaw pain 08/31/2015   Past Surgical History  Procedure Laterality Date  . Tubal ligation  1977  . Cesarean section  1976  . Abdominal hysterectomy  1991  . Laparoscopy  1976  . Ganglion cyst excision      Left hand  . Cataract extraction, bilateral Bilateral 03/2013  . Transthoracic echocardiogram  01/2013    EF 55-60%, mod conc LVH; mild MR with mildly thickened leaflets; mild TR; PA peak  pressure 7mHg  . Nm myocar perf wall motion  01/2013    lexiscan - no pharmacologically induced ischemia, fixed defects in apical segments of anterior septum & inferior lateral wall     Current Outpatient Prescriptions  Medication Sig Dispense Refill  . cyclobenzaprine (FLEXERIL) 10 MG tablet Take 1 tablet (10 mg total) by mouth 3 (three) times daily as needed for muscle spasms. 30 tablet 0  . estradiol (ESTRACE) 1 MG tablet Take 1 tablet (1 mg total) by mouth daily. 90 tablet 3  . fish oil-omega-3 fatty acids 1000 MG capsule Take 1 g by mouth 3 (three) times daily.     . fluticasone (FLONASE) 50 MCG/ACT nasal spray USE 2 SPRAYS IN EACH NOSTRIL ONCE DAILY 16 g 11  . loratadine (CLARITIN) 10 MG tablet Take 10 mg by mouth daily.    . metoprolol tartrate (LOPRESSOR) 25 MG tablet TAKE 1/2 TABLET BY MOUTH TWICE DAILY (Patient taking differently: Take 12.5 mg by mouth 2 (two) times daily. TAKE 1/2 TABLET BY MOUTH TWICE DAILY) 90 tablet 0  . pravastatin (PRAVACHOL) 40 MG tablet TAKE 1 TABLET BY MOUTH ONCE DAILY (Patient taking differently: Take 40 mg by mouth daily. TAKE 1 TABLET BY MOUTH ONCE DAILY) 90 tablet 3  . ranitidine (ZANTAC) 150 MG tablet Take 150 mg by mouth daily with supper.     .Marland Kitchen  rivaroxaban (XARELTO) 20 MG TABS tablet Take 1 tablet (20 mg total) by mouth daily with supper. 30 tablet 0  . sertraline (ZOLOFT) 25 MG tablet Take 1 tablet (25 mg total) by mouth daily. 90 tablet 3  . flecainide (TAMBOCOR) 100 MG tablet Take 2 tablets by mouth with onset of atrial fib. No more than once a week 6 tablet 0   No current facility-administered medications for this encounter.    Allergies  Allergen Reactions  . Adhesive [Tape]     Burn skin  . Erythromycin     Gi intolerance  . Naproxen Nausea And Vomiting    Abdominal pain   . Penicillins Hives    Social History   Social History  . Marital Status: Married    Spouse Name: Herbie Baltimore  . Number of Children: 1  . Years of Education: 12    Occupational History  . insurance agent    Social History Main Topics  . Smoking status: Former Smoker -- 0.50 packs/day for 36 years    Types: Cigarettes    Start date: 03/15/1963    Quit date: 01/08/2005  . Smokeless tobacco: Never Used  . Alcohol Use: No  . Drug Use: No  . Sexual Activity:    Partners: Male    Birth Control/ Protection: Surgical   Other Topics Concern  . Not on file   Social History Narrative   Lives with her husband and their cat, Elvis Coil.    Family History  Problem Relation Age of Onset  . Stroke Mother   . Diabetes Father   . Heart disease Father     CABG  . Arthritis Father     C6-7  . Skin cancer Father     SCC  . Arthritis Brother     s/p THR    ROS- All systems are reviewed and negative except as per the HPI above  Physical Exam: Filed Vitals:   09/03/15 1551  BP: 124/60  Pulse: 57  Height: '5\' 5"'  (1.651 m)  Weight: 171 lb 6.4 oz (77.747 kg)    GEN- The patient is well appearing, alert and oriented x 3 today.   Head- normocephalic, atraumatic Eyes-  Sclera clear, conjunctiva pink Ears- hearing intact Oropharynx- clear Neck- supple, no JVP Lymph- no cervical lymphadenopathy Lungs- Clear to ausculation bilaterally, normal work of breathing Heart- Regular rate and rhythm, no murmurs, rubs or gallops, PMI not laterally displaced GI- soft, NT, ND, + BS Extremities- no clubbing, cyanosis, or edema MS- no significant deformity or atrophy Skin- no rash or lesion Psych- euthymic mood, full affect Neuro- strength and sensation are intact  EKG- Sinus brady at 57 bpm, Pr int 146 ms, QRS 86 ms, QTc 406 ms Epic records reviewed Labs, ER record, EKG  Assessment and Plan: 1. PAF  PT educated about how to manage afib and when it would be appropriate to stay home and treat. Dr. Rayann Heman suggests flecainide 200 mg x one dose for breakthrough afib, she knows that this dose can not be used but once a week, unless further instruction from  office.  2. Chadsvasc score of 2 Start 20 mg xarelto in am, had erroneously been given starter kit with initial xarelto dose 15 mg daily.  F/u in one month in afib clinic Dr. Debara Pickett as scheduled  Geroge Baseman. Rishawn Walck, [facility] [address] [place], [place] [address] [phone]

## 2015-09-11 ENCOUNTER — Other Ambulatory Visit: Payer: Self-pay | Admitting: Physician Assistant

## 2015-09-24 ENCOUNTER — Encounter: Payer: Self-pay | Admitting: Physician Assistant

## 2015-09-24 ENCOUNTER — Ambulatory Visit (INDEPENDENT_AMBULATORY_CARE_PROVIDER_SITE_OTHER): Payer: PRIVATE HEALTH INSURANCE | Admitting: Physician Assistant

## 2015-09-24 VITALS — BP 120/70 | HR 53 | Temp 98.3°F | Resp 16 | Ht 64.5 in | Wt 167.6 lb

## 2015-09-24 DIAGNOSIS — Z23 Encounter for immunization: Secondary | ICD-10-CM | POA: Diagnosis not present

## 2015-09-24 DIAGNOSIS — R739 Hyperglycemia, unspecified: Secondary | ICD-10-CM

## 2015-09-24 DIAGNOSIS — E785 Hyperlipidemia, unspecified: Secondary | ICD-10-CM

## 2015-09-24 DIAGNOSIS — K635 Polyp of colon: Secondary | ICD-10-CM

## 2015-09-24 LAB — HEMOGLOBIN A1C: Hgb A1c MFr Bld: 6.1 % — AB (ref 4.0–6.0)

## 2015-09-24 LAB — GLUCOSE, POCT (MANUAL RESULT ENTRY): POC Glucose: 124 mg/dl — AB (ref 70–99)

## 2015-09-24 LAB — LIPID PANEL
Cholesterol: 181 mg/dL (ref 125–200)
HDL: 51 mg/dL (ref >=46)
LDL Cholesterol: 95 mg/dL (ref <130)
Total CHOL/HDL Ratio: 3.5 Ratio (ref <=5.0)
Triglycerides: 176 mg/dL — ABNORMAL HIGH (ref <150)
VLDL: 35 mg/dL — ABNORMAL HIGH (ref <30)

## 2015-09-24 LAB — COMPREHENSIVE METABOLIC PANEL
ALT: 19 U/L (ref 6–29)
AST: 19 U/L (ref 10–35)
Albumin: 3.9 g/dL (ref 3.6–5.1)
Alkaline Phosphatase: 80 U/L (ref 33–130)
BUN: 9 mg/dL (ref 7–25)
CO2: 24 mmol/L (ref 20–31)
Calcium: 9.4 mg/dL (ref 8.6–10.4)
Chloride: 104 mmol/L (ref 98–110)
Creat: 0.76 mg/dL (ref 0.50–0.99)
Glucose, Bld: 118 mg/dL — ABNORMAL HIGH (ref 65–99)
Potassium: 4.4 mmol/L (ref 3.5–5.3)
Sodium: 140 mmol/L (ref 135–146)
Total Bilirubin: 0.4 mg/dL (ref 0.2–1.2)
Total Protein: 6.5 g/dL (ref 6.1–8.1)

## 2015-09-24 LAB — POCT GLYCOSYLATED HEMOGLOBIN (HGB A1C): Hemoglobin A1C: 6.1

## 2015-09-24 NOTE — Progress Notes (Signed)
Patient ID: Daisy Nelson, female    DOB: 1946/05/12, 69 y.o.   MRN: [id number]  PCP: Wynne Dust  Subjective:   Chief Complaint  Patient presents with  . Follow-up    6 mos  . Hypertension  . Hyperlipidemia    HPI Presents for evaluation of HTN and HLD.   Since her last follow-up in April, patient went to the ED on 08/31/15 after waking up in the middle of the night feeling like she got punched in the jaw and neck. When the sensation did not go away after several minutes, she decided to go to the ED, where it was determined she was having paroxysmal AF with RVR. She was prescribed flecainide to take at home and started on Xarelto. She has not needed the flecainide since her ED visit and is tolerating Xarelto well. She states she had a particularly stressful week at work prior to the incident. She reports having only one episode of RVR in the past (01/2013).   Since her ED visit, she got a BP cuff and has been checking her blood pressure daily, usually in the evenings. They typically run in the 110/60-70 range, however she has had a couple readings this week in the 160/60 range. The last two days she has been running 120/60-70. She continues to take Metoprolol tartrate 12.5 mg BID without complaints.   She continues to take the steps at work and at home for exercise and is trying to eat a well-balanced diet with lots of fruits, vegetables, and lean protein. She just started drinking an afternoon cup of caffeinated tea since her ED visit and has had no issues.   She is still taking pravastatin 40 mg/day for her HLD without any problems.   Anxiety stable, currently taking Zoloft 25 mg/day.   Patient is due for a colonoscopy, which she receives every 5 years d/t her history of colon polyps. Planning to schedule in January.   Would like to get her flu vaccination today.   No refills on medications needed. No other concerns on today's visit.      Review of  Systems Constitutional: Negative for fever, chills, activity change, appetite change and fatigue.  HENT: Negative.  Respiratory: Negative for cough and shortness of breath.  Cardiovascular: Negative for chest pain and palpitations.  Musculoskeletal: Negative for back pain.  Neurological: Negative for dizziness, light-headedness and headaches.      Patient Active Problem List   Diagnosis Date Noted  . Atrial fibrillation with RVR ([place]) 08/31/2015  . Jaw pain 08/31/2015  . Hyperglycemia 03/26/2015  . PAF (paroxysmal atrial fibrillation) ([place]) 07/10/2013  . Unstable angina ([place]) 02/06/2013  . Atrial fibrillation with rapid ventricular response ([place]) 02/06/2013  . Family history of coronary artery bypass surgery 02/06/2013  . GERD (gastroesophageal reflux disease) 10/25/2012  . Allergy 10/25/2012  . Hyperlipidemia 10/25/2012  . Depression 10/25/2012  . Anxiety 10/25/2012  . MVP (mitral valve prolapse) 10/25/2012  . DJD (degenerative joint disease) of cervical spine 10/25/2012  . Nephrolithiasis 10/25/2012  . Colon polyps 10/25/2012     Prior to Admission medications   Medication Sig Start Date End Date Taking? Authorizing Provider  estradiol (ESTRACE) 1 MG tablet TAKE ONE TABLET BY MOUTH ONCE DIALY 09/12/15  Yes Dalaysia Harms, PA-C  fish oil-omega-3 fatty acids 1000 MG capsule Take 1 g by mouth 3 (three) times daily.    Yes Historical Provider, MD  flecainide (TAMBOCOR) 100 MG tablet Take 2 tablets by mouth with onset  of atrial fib. No more than once a week 09/03/15  Yes Sherran Needs, NP  fluticasone [facility]) 50 MCG/ACT nasal spray USE 2 SPRAYS IN EACH NOSTRIL ONCE DAILY 09/28/14  Yes Thao P Le, DO  loratadine (CLARITIN) 10 MG tablet Take 10 mg by mouth daily.   Yes Historical Provider, MD  metoprolol tartrate (LOPRESSOR) 25 MG tablet TAKE 1/2 TABLET BY MOUTH TWICE DAILY Patient taking differently: Take 12.5 mg by mouth 2 (two) times daily. TAKE 1/2 TABLET BY MOUTH TWICE DAILY  07/13/15  Yes Mancel Bale, PA-C  pravastatin (PRAVACHOL) 40 MG tablet TAKE 1 TABLET BY MOUTH ONCE DAILY Patient taking differently: Take 40 mg by mouth daily. TAKE 1 TABLET BY MOUTH ONCE DAILY 09/28/14  Yes Thao P Le, DO  ranitidine (ZANTAC) 150 MG tablet Take 150 mg by mouth daily with supper.    Yes Historical Provider, MD  rivaroxaban (XARELTO) 20 MG TABS tablet Take 1 tablet (20 mg total) by mouth daily with supper. 08/31/15  Yes Isaiah Serge, NP  sertraline (ZOLOFT) 25 MG tablet Take 1 tablet (25 mg total) by mouth daily. 09/28/14  Yes Thao P Le, DO  cyclobenzaprine (FLEXERIL) 10 MG tablet Take 1 tablet (10 mg total) by mouth 3 (three) times daily as needed for muscle spasms. Patient not taking: Reported on 09/24/2015 04/17/15   Harrison Mons, PA-C     Allergies  Allergen Reactions  . Adhesive [Tape]     Burn skin  . Erythromycin     Gi intolerance  . Naproxen Nausea And Vomiting    Abdominal pain   . Penicillins Hives       Objective:  Physical Exam  Constitutional: She is oriented to person, place, and time. Vital signs are normal. She appears well-developed and well-nourished. She is active and cooperative. No distress.  BP 120/70 mmHg  Pulse 53  Temp(Src) 98.3 F (36.8 C) (Oral)  Resp 16  Ht 5' 4.5" (1.638 m)  Wt 167 lb 9.6 oz (76.023 kg)  BMI 28.33 kg/m2  SpO2 97%  HENT:  Head: Normocephalic and atraumatic.  Right Ear: Hearing normal.  Left Ear: Hearing normal.  Eyes: Conjunctivae are normal. No scleral icterus.  Neck: Normal range of motion. Neck supple. No thyromegaly present.  Cardiovascular: Normal rate, regular rhythm and normal heart sounds.   Pulses:      Radial pulses are 2+ on the right side, and 2+ on the left side.  Pulmonary/Chest: Effort normal and breath sounds normal.  Lymphadenopathy:       Head (right side): No tonsillar, no preauricular, no posterior auricular and no occipital adenopathy present.       Head (left side): No tonsillar, no  preauricular, no posterior auricular and no occipital adenopathy present.    She has no cervical adenopathy.       Right: No supraclavicular adenopathy present.       Left: No supraclavicular adenopathy present.  Neurological: She is alert and oriented to person, place, and time. No sensory deficit.  Skin: Skin is warm, dry and intact. No rash noted. No cyanosis or erythema. Nails show no clubbing.  Psychiatric: She has a normal mood and affect. Her speech is normal and behavior is normal.           Assessment & Plan:   1. Hyperlipidemia Await labs. Has been stable. Adjust regimen if indicated. - Lipid panel - Comprehensive metabolic panel  2. Hyperglycemia Has been well controlled without medications. Consider metformin if  indicated by labs. - POCT glucose (manual entry) - POCT glycosylated hemoglobin (Hb A1C)  3. Colon polyps Patient will schedule her follow-up with GI and colonoscopy.  4. Need for influenza vaccination - Flu Vaccine QUAD 36+ mos IM   Fara Chute, PA-C Physician Assistant-Certified [facility]

## 2015-09-24 NOTE — Patient Instructions (Signed)
I will contact you with your lab results as soon as they are available.   If you have not heard from me in 2 weeks, please contact me.  The fastest way to get your results is to register for My Chart (see the instructions on the last page of this printout).  Get your colonoscopy scheduled!  Win the [employer]!

## 2015-09-24 NOTE — Progress Notes (Signed)
Subjective:    Patient ID: Daisy Nelson, female    DOB: 1946-08-18, 69 y.o.   MRN: [id number]  Chief Complaint  Patient presents with  . Follow-up    6 mos  . Hypertension  . Hyperlipidemia   HPI Patient presents today for 6 mo follow-up of HTN and HLD.  Since her last follow-up in April, patient went to the ED on 08/31/15 after waking up in the middle of the night feeling like she got punched in the jaw and neck. When the sensation did not go away after several minutes, she decided to go to the ED, where it was determined she was having paroxysmal AF with RVR. She was prescribed flecainide to take at home and started on Xarelto. She has not needed the flecainide since her ED visit and is tolerating Xarelto well. She states she had a particularly stressful week at work prior to the incident. She reports having only one episode of RVR in the past (01/2013).   Since her ED visit, she got a BP cuff and has been checking her blood pressure daily, usually in the evenings. They typically run in the 110/60-70 range, however she has had a couple readings this week in the 160/60 range. The last two days she has been running 120/60-70. She continues to take Metoprolol tartrate 12.5 mg BID without complaints.  She continues to take the steps at work and at home for exercise and is trying to eat a well-balanced diet with lots of fruits, vegetables, and lean protein. She just started drinking an afternoon cup of caffeinated tea since her ED visit and has had no issues.   She is still taking pravastatin 40 mg/day for her HLD without any problems.  Anxiety stable, currently taking Zoloft 25 mg/day.  Patient is due for a colonoscopy, which she receives every 5 years d/t her history of colon polyps. Planning to schedule in January.   Would like to get her flu vaccination today.   No refills on medications needed. No other concerns on today's visit.   Review of Systems  Constitutional: Negative  for fever, chills, activity change, appetite change and fatigue.  HENT: Negative.   Respiratory: Negative for cough and shortness of breath.   Cardiovascular: Negative for chest pain and palpitations.  Musculoskeletal: Negative for back pain.  Neurological: Negative for dizziness, light-headedness and headaches.   Patient Active Problem List   Diagnosis Date Noted  . Atrial fibrillation with RVR ([place]) 08/31/2015  . Jaw pain 08/31/2015  . Hyperglycemia 03/26/2015  . PAF (paroxysmal atrial fibrillation) ([place]) 07/10/2013  . Unstable angina ([employer]) 02/06/2013  . Atrial fibrillation with rapid ventricular response ([place]) 02/06/2013  . Family history of coronary artery bypass surgery 02/06/2013  . GERD (gastroesophageal reflux disease) 10/25/2012  . Allergy 10/25/2012  . Hyperlipidemia 10/25/2012  . Depression 10/25/2012  . Anxiety 10/25/2012  . MVP (mitral valve prolapse) 10/25/2012  . DJD (degenerative joint disease) of cervical spine 10/25/2012  . Nephrolithiasis 10/25/2012  . Colon polyps 10/25/2012      Family History  Problem Relation Age of Onset  . Stroke Mother   . Diabetes Father   . Heart disease Father     CABG  . Arthritis Father     C6-7  . Skin cancer Father     SCC  . Arthritis Brother     s/p THR   Social History   Social History  . Marital Status: Married    Spouse Name: Herbie Baltimore  .  Number of Children: 1  . Years of Education: 12   Occupational History  . insurance agent    Social History Main Topics  . Smoking status: Former Smoker -- 0.50 packs/day for 36 years    Types: Cigarettes    Start date: 03/15/1963    Quit date: 01/08/2005  . Smokeless tobacco: Never Used  . Alcohol Use: No  . Drug Use: No  . Sexual Activity:    Partners: Male    Birth Control/ Protection: Surgical   Other Topics Concern  . Not on file   Social History Narrative   Lives with her husband and their cat, Elvis Coil.   Prior to Admission medications   Medication Sig  Start Date End Date Taking? Authorizing Provider  estradiol (ESTRACE) 1 MG tablet TAKE ONE TABLET BY MOUTH ONCE DIALY 09/12/15  Yes Chelle Jeffery, PA-C  fish oil-omega-3 fatty acids 1000 MG capsule Take 1 g by mouth 3 (three) times daily.    Yes Historical Provider, MD  flecainide (TAMBOCOR) 100 MG tablet Take 2 tablets by mouth with onset of atrial fib. No more than once a week 09/03/15  Yes Sherran Needs, NP  fluticasone [facility]) 50 MCG/ACT nasal spray USE 2 SPRAYS IN EACH NOSTRIL ONCE DAILY 09/28/14  Yes Thao P Le, DO  loratadine (CLARITIN) 10 MG tablet Take 10 mg by mouth daily.   Yes Historical Provider, MD  metoprolol tartrate (LOPRESSOR) 25 MG tablet TAKE 1/2 TABLET BY MOUTH TWICE DAILY Patient taking differently: Take 12.5 mg by mouth 2 (two) times daily. TAKE 1/2 TABLET BY MOUTH TWICE DAILY 07/13/15  Yes Mancel Bale, PA-C  pravastatin (PRAVACHOL) 40 MG tablet TAKE 1 TABLET BY MOUTH ONCE DAILY Patient taking differently: Take 40 mg by mouth daily. TAKE 1 TABLET BY MOUTH ONCE DAILY 09/28/14  Yes Thao P Le, DO  ranitidine (ZANTAC) 150 MG tablet Take 150 mg by mouth daily with supper.    Yes Historical Provider, MD  rivaroxaban (XARELTO) 20 MG TABS tablet Take 1 tablet (20 mg total) by mouth daily with supper. 08/31/15  Yes Isaiah Serge, NP  sertraline (ZOLOFT) 25 MG tablet Take 1 tablet (25 mg total) by mouth daily. 09/28/14  Yes Thao P Le, DO  cyclobenzaprine (FLEXERIL) 10 MG tablet Take 1 tablet (10 mg total) by mouth 3 (three) times daily as needed for muscle spasms. Patient not taking: Reported on 09/24/2015 04/17/15   Harrison Mons, PA-C   Allergies  Allergen Reactions  . Adhesive [Tape]     Burn skin  . Erythromycin     Gi intolerance  . Naproxen Nausea And Vomiting    Abdominal pain   . Penicillins Hives   Objective:   Physical Exam  Constitutional: She is oriented to person, place, and time. She appears well-developed and well-nourished. No distress.  BP 120/70 mmHg   Pulse 53  Temp(Src) 98.3 F (36.8 C) (Oral)  Resp 16  Ht 5' 4.5" (1.638 m)  Wt 167 lb 9.6 oz (76.023 kg)  BMI 28.33 kg/m2  SpO2 97%  HENT:  Head: Normocephalic and atraumatic.  Eyes: EOM are normal. No scleral icterus.  Neck: Neck supple. No thyromegaly present.  Cardiovascular: Intact distal pulses.  Exam reveals no gallop and no friction rub.   No murmur heard. bradycardia  Pulmonary/Chest: Effort normal and breath sounds normal. No respiratory distress. She has no wheezes. She has no rales.  Musculoskeletal: She exhibits no edema.  Lymphadenopathy:    She has no  cervical adenopathy.  Neurological: She is alert and oriented to person, place, and time.  Skin: Skin is warm and dry. No rash noted. She is not diaphoretic. No erythema.  Psychiatric: She has a normal mood and affect. Her behavior is normal. Judgment and thought content normal.      Assessment & Plan:  1. Hyperlipidemia - Continue to take the stairs at work and at home and eat a low-fat, high-fiber diet. Continue pravastatin 40 mg/day.   - Lipid panel - Comprehensive metabolic panel  2. Hyperglycemia - Encourage patient to continue to exercise and eat a well-balanced diet for glycemic control.  - POCT glucose (manual entry) - POCT glycosylated hemoglobin (Hb A1C)  3. Colon polyps - Schedule colonoscopy for January.   4. Need for influenza vaccination - Flu Vaccine QUAD 36+ mos IM

## 2015-09-27 ENCOUNTER — Encounter: Payer: Self-pay | Admitting: Family Medicine

## 2015-10-08 ENCOUNTER — Ambulatory Visit (HOSPITAL_COMMUNITY)
Admission: RE | Admit: 2015-10-08 | Discharge: 2015-10-08 | Disposition: A | Discharge status: Home / Self Care | Payer: 59 | Source: Ambulatory Visit | Attending: Nurse Practitioner | Admitting: Nurse Practitioner

## 2015-10-08 VITALS — BP 118/64 | HR 54 | Ht 65.0 in | Wt 169.2 lb

## 2015-10-08 DIAGNOSIS — I480 Paroxysmal atrial fibrillation: Secondary | ICD-10-CM | POA: Insufficient documentation

## 2015-10-08 MED ORDER — METOPROLOL TARTRATE 25 MG PO TABS: ORAL_TABLET | ORAL | Status: DC

## 2015-10-09 ENCOUNTER — Encounter (HOSPITAL_COMMUNITY): Payer: Self-pay | Admitting: Nurse Practitioner

## 2015-10-09 NOTE — Progress Notes (Signed)
Patient ID: [name], female   DOB: Mar 03, 1946, 69 y.o.   MRN: [id number]     Primary Care Physician: Wynne Dust Referring Physician: [facility]   Daisy Nelson is a 69 y.o. female with a h/o PAF with last breakthrough afib in 2014. She presented to [facility] 10/1 with afib that started abruptly. She states that she ate [place] food and drank a margarita the night before and usually does not drink alcohol. She converted with cardizem drip in the ER and discharged home.  She is for f/u in the afib clinic. She was started on xarelto for chadsvasc score of 2(female HTN) . She however was sent home with a xarelto starter pack and is currently on 15 mg a day, which is not the correct dose for her. She will start in am 20 mg a day. She has been on metoprolol in the past and will continue. She has had no further issues with fast heart rate.  She returns to afib clinic 11/8 and reports no other issues with irregular heart beat. She continues on xarelto.  Today, she denies symptoms of palpitations, chest pain, shortness of breath, orthopnea, PND, lower extremity edema, dizziness, presyncope, syncope, or neurologic sequela. The patient is tolerating medications without difficulties and is otherwise without complaint today.   Past Medical History  Diagnosis Date  . GERD (gastroesophageal reflux disease)   . Allergy   . Hyperlipidemia   . Depression   . Anxiety   . DJD (degenerative joint disease) of cervical spine   . Nephrolithiasis   . Hyperglycemia   . Colon polyps   . AF (atrial fibrillation) ([place])     AF with RVR 01/2013  . Family history of CABG   . Atrial fibrillation with RVR ([place]) 08/31/2015  . Jaw pain 08/31/2015   Past Surgical History  Procedure Laterality Date  . Tubal ligation  1977  . Cesarean section  1976  . Abdominal hysterectomy  1991  . Laparoscopy  1976  . Ganglion cyst excision      Left hand  . Cataract extraction, bilateral Bilateral 03/2013  .  Transthoracic echocardiogram  01/2013    EF 55-60%, mod conc LVH; mild MR with mildly thickened leaflets; mild TR; PA peak pressure 8mmHg  . Nm myocar perf wall motion  01/2013    lexiscan - no pharmacologically induced ischemia, fixed defects in apical segments of anterior septum & inferior lateral wall     Current Outpatient Prescriptions  Medication Sig Dispense Refill  . cyclobenzaprine (FLEXERIL) 10 MG tablet Take 1 tablet (10 mg total) by mouth 3 (three) times daily as needed for muscle spasms. 30 tablet 0  . estradiol (ESTRACE) 1 MG tablet TAKE ONE TABLET BY MOUTH ONCE DIALY 90 tablet 0  . fish oil-omega-3 fatty acids 1000 MG capsule Take 1 g by mouth 3 (three) times daily.     . flecainide (TAMBOCOR) 100 MG tablet Take 2 tablets by mouth with onset of atrial fib. No more than once a week 6 tablet 0  . fluticasone (FLONASE) 50 MCG/ACT nasal spray USE 2 SPRAYS IN EACH NOSTRIL ONCE DAILY 16 g 11  . loratadine (CLARITIN) 10 MG tablet Take 10 mg by mouth daily.    . metoprolol tartrate (LOPRESSOR) 25 MG tablet TAKE 1/2 TABLET BY MOUTH TWICE DAILY 90 tablet 6  . pravastatin (PRAVACHOL) 40 MG tablet TAKE 1 TABLET BY MOUTH ONCE DAILY (Patient taking differently: Take 40 mg by mouth daily. TAKE 1 TABLET  BY MOUTH ONCE DAILY) 90 tablet 3  . ranitidine (ZANTAC) 150 MG tablet Take 150 mg by mouth daily with supper.     . rivaroxaban (XARELTO) 20 MG TABS tablet Take 1 tablet (20 mg total) by mouth daily with supper. 30 tablet 0  . sertraline (ZOLOFT) 25 MG tablet Take 1 tablet (25 mg total) by mouth daily. 90 tablet 3   No current facility-administered medications for this encounter.    Allergies  Allergen Reactions  . Adhesive [Tape]     Burn skin  . Erythromycin     Gi intolerance  . Naproxen Nausea And Vomiting    Abdominal pain   . Penicillins Hives    Social History   Social History  . Marital Status: Married    Spouse Name: Herbie Baltimore  . Number of Children: 1  . Years of Education:  12   Occupational History  . insurance agent    Social History Main Topics  . Smoking status: Former Smoker -- 0.50 packs/day for 36 years    Types: Cigarettes    Start date: 03/15/1963    Quit date: 01/08/2005  . Smokeless tobacco: Never Used  . Alcohol Use: No  . Drug Use: No  . Sexual Activity:    Partners: Male    Birth Control/ Protection: Surgical   Other Topics Concern  . Not on file   Social History Narrative   Lives with her husband and their cat, Elvis Coil.    Family History  Problem Relation Age of Onset  . Stroke Mother   . Diabetes Father   . Heart disease Father     CABG  . Arthritis Father     C6-7  . Skin cancer Father     SCC  . Arthritis Brother     s/p THR    ROS- All systems are reviewed and negative except as per the HPI above  Physical Exam: Filed Vitals:   10/08/15 1527  BP: 118/64  Pulse: 54  Height: 5\' 5"  (1.651 m)  Weight: 169 lb 3.2 oz (76.749 kg)    GEN- The patient is well appearing, alert and oriented x 3 today.   Head- normocephalic, atraumatic Eyes-  Sclera clear, conjunctiva pink Ears- hearing intact Oropharynx- clear Neck- supple, no JVP Lymph- no cervical lymphadenopathy Lungs- Clear to ausculation bilaterally, normal work of breathing Heart- Regular rate and rhythm, no murmurs, rubs or gallops, PMI not laterally displaced GI- soft, NT, ND, + BS Extremities- no clubbing, cyanosis, or edema MS- no significant deformity or atrophy Skin- no rash or lesion Psych- euthymic mood, full affect Neuro- strength and sensation are intact  EKG- Sinus brady at 57 bpm, Pr int 146 ms, QRS 86 ms, QTc 406 ms Epic records reviewed   Assessment and Plan: 1. PAF Maintaining SR PT educated about how to manage afib and when it would be appropriate to stay home and treat. Dr. Rayann Heman suggests flecainide 200 mg x one dose for breakthrough afib, she knows that this dose can not be used but once a week, unless further instruction from  office.  2. Chadsvasc score of 2 Continue xarelto 20 mg a day   Dr. Debara Pickett as scheduled Afib clinic as needed  Butch Penny C. Beila Purdie, [facility] [address] [place], [place] [address] [phone]

## 2015-10-16 ENCOUNTER — Other Ambulatory Visit: Payer: Self-pay | Admitting: Family Medicine

## 2015-10-25 ENCOUNTER — Encounter: Payer: Self-pay | Admitting: Physician Assistant

## 2015-10-25 ENCOUNTER — Other Ambulatory Visit: Payer: Self-pay | Admitting: Family Medicine

## 2015-10-25 MED ORDER — SERTRALINE HCL 25 MG PO TABS
25.0000 mg | ORAL_TABLET | Freq: Every day | ORAL | Status: DC
Start: 2015-10-25 — End: 2017-01-18

## 2015-10-25 NOTE — Telephone Encounter (Signed)
Meds ordered this encounter  Medications  . sertraline (ZOLOFT) 25 MG tablet    Sig: Take 1 tablet (25 mg total) by mouth daily.    Dispense:  90 tablet    Refill:  3    Order Specific Question:  Supervising Provider    Answer:  Leandrew Koyanagi [id number]    Patient notified via My Chart.

## 2015-11-19 ENCOUNTER — Other Ambulatory Visit: Payer: Self-pay | Admitting: Family Medicine

## 2015-11-28 NOTE — Progress Notes (Signed)
This encounter was created in error - please disregard.

## 2015-12-14 ENCOUNTER — Encounter: Payer: Self-pay | Admitting: Physician Assistant

## 2015-12-14 ENCOUNTER — Other Ambulatory Visit: Payer: Self-pay | Admitting: Physician Assistant

## 2015-12-14 MED ORDER — ESTRADIOL 1 MG PO TABS: ORAL_TABLET | ORAL | Status: DC

## 2015-12-14 NOTE — Telephone Encounter (Signed)
Patient notified via My Chart.  Meds ordered this encounter  Medications  . estradiol (ESTRACE) 1 MG tablet    Sig: TAKE ONE TABLET BY MOUTH ONCE DIALY    Dispense:  90 tablet    Refill:  3    Order Specific Question:  Supervising Provider    Answer:  DOOLITTLE, ROBERT P [id number]

## 2015-12-26 ENCOUNTER — Other Ambulatory Visit: Payer: Self-pay | Admitting: Family Medicine

## 2016-01-15 ENCOUNTER — Ambulatory Visit (INDEPENDENT_AMBULATORY_CARE_PROVIDER_SITE_OTHER): Payer: PRIVATE HEALTH INSURANCE | Admitting: Internal Medicine

## 2016-01-15 ENCOUNTER — Encounter: Payer: Self-pay | Admitting: Internal Medicine

## 2016-01-15 VITALS — BP 112/58 | HR 54 | Ht 65.0 in | Wt 171.3 lb

## 2016-01-15 DIAGNOSIS — I341 Nonrheumatic mitral (valve) prolapse: Secondary | ICD-10-CM | POA: Diagnosis not present

## 2016-01-15 DIAGNOSIS — F419 Anxiety disorder, unspecified: Secondary | ICD-10-CM | POA: Diagnosis not present

## 2016-01-15 DIAGNOSIS — E785 Hyperlipidemia, unspecified: Secondary | ICD-10-CM | POA: Diagnosis not present

## 2016-01-15 DIAGNOSIS — I480 Paroxysmal atrial fibrillation: Secondary | ICD-10-CM | POA: Diagnosis not present

## 2016-01-15 NOTE — Progress Notes (Signed)
OFFICE NOTE  Chief Complaint:  Occasional feet swelling  Primary Care Physician: Wynne Dust  ID:  Daisy Nelson, DOB Oct 12, 1946, MRN [id number]  PCP:  JEFFERY,CHELLE, PA-C  Primary Cardiologist:  Lauriana Denes     History of Present Illness: Daisy Nelson is a 70 y.o. female no prior history of CAD or AF, presented to the ER 3/10 around 3am with complaints of jaw pain, palpitations, and SOB. Her symptoms woke her from sleep around 12:30am. In the ER she was noted to be in Rapid AF. Her EKG shows diffuse ST depression. Initial Troponin is negative. Her symptoms improved after Diltiazem IV was started. She denied any prior history of similar symptoms in the past. She did take Mucinex D earlier this week for sinus infection.   The patient was admitted to [facility] and started on diltiazem, heparin, ASA, beta blocker and nitrates. She spontaneously converted to NSR and ruled out for MI. Lexiscan myovew revealed normal LVF, no ischemia and fixed defects involving the apical segments of the inferior septum and inferior lateral walls. 2D echo confirmed normal EF of 55-60%. Mild TR. CHADSVSC= 1. She was started on Xarelto.  Lopressor was continued.   She was last seen by Tarri Fuller, PA-C, as she was about to have cataract surgery.  It went very well for her and she can now see clearly.  She reports only an accessional fluttering in her chest.  I did decrease her lopressor to 12.5 mg bid during the last visit because she was having a HR in the 40's.  She otherwise denies  nausea, vomiting, fever, chest pain, shortness of breath, orthopnea, dizziness, PND, cough, congestion, abdominal pain, hematochezia, melena, lower extremity edema.  Daisy Nelson back in the office today. She is currently without complaints. She is active denies any chest pain or shortness of breath. She very rarely gets palpitations the last only for a few seconds. She denies any recurrent atrial fibrillation and her EKG today  sinus rhythm. She's had no bleeding problems on aspirin. She had laboratory work in October which shows a well controlled lipid profile on pravastatin. Weight is stable  I saw Daisy Nelson back today in the office. Overall she is doing very well. She denies any chest pain or shortness of breath. She's had no recurrent A. Fib episodes. She has not needed to take her flecainide as needed. She continues on Xarelto without any bleeding problems. Blood pressure is well-controlled today.   Wt Readings from Last 3 Encounters:  01/15/16 171 lb 4.8 oz (77.701 kg)  10/08/15 169 lb 3.2 oz (76.749 kg)  09/24/15 167 lb 9.6 oz (76.023 kg)     Past Medical History  Diagnosis Date  . GERD (gastroesophageal reflux disease)   . Allergy   . Hyperlipidemia   . Depression   . Anxiety   . DJD (degenerative joint disease) of cervical spine   . Nephrolithiasis   . Hyperglycemia   . Colon polyps   . AF (atrial fibrillation) ([place])     AF with RVR 01/2013  . Family history of CABG   . Atrial fibrillation with RVR ([place]) 08/31/2015  . Jaw pain 08/31/2015    Current Outpatient Prescriptions  Medication Sig Dispense Refill  . estradiol (ESTRACE) 1 MG tablet TAKE ONE TABLET BY MOUTH ONCE DIALY 90 tablet 3  . fish oil-omega-3 fatty acids 1000 MG capsule Take 1 g by mouth 3 (three) times daily.     . flecainide (TAMBOCOR) 100 MG tablet Take  2 tablets by mouth with onset of atrial fib. No more than once a week 6 tablet 0  . fluticasone (FLONASE) 50 MCG/ACT nasal spray USE 2 SPRAYS IN EACH NOSTRIL ONCE DAILY 16 g 0  . loratadine (CLARITIN) 10 MG tablet Take 10 mg by mouth daily.    . metoprolol tartrate (LOPRESSOR) 25 MG tablet TAKE 1/2 TABLET BY MOUTH TWICE DAILY 90 tablet 6  . pravastatin (PRAVACHOL) 40 MG tablet TAKE 1 TABLET BY MOUTH ONCE DAILY 90 tablet 1  . ranitidine (ZANTAC) 150 MG tablet Take 150 mg by mouth daily with supper.     . rivaroxaban (XARELTO) 20 MG TABS tablet Take 1 tablet (20 mg total) by  mouth daily with supper. 30 tablet 0  . sertraline (ZOLOFT) 25 MG tablet Take 1 tablet (25 mg total) by mouth daily. 90 tablet 3   No current facility-administered medications for this visit.    Allergies:    Allergies  Allergen Reactions  . Adhesive [Tape]     Burn skin  . Erythromycin     Gi intolerance  . Naproxen Nausea And Vomiting    Abdominal pain   . Penicillins Hives    Social History:  The patient  reports that she quit smoking about 11 years ago. Her smoking use included Cigarettes. She started smoking about 52 years ago. She has a 18 pack-year smoking history. She has never used smokeless tobacco. She reports that she does not drink alcohol or use illicit drugs.   Family history:   Family History  Problem Relation Age of Onset  . Stroke Mother   . Diabetes Father   . Heart disease Father     CABG  . Arthritis Father     C6-7  . Skin cancer Father     SCC  . Arthritis Brother     s/p THR    ROS: A comprehensive review of systems was negative.  HOME MEDS: Current Outpatient Prescriptions  Medication Sig Dispense Refill  . estradiol (ESTRACE) 1 MG tablet TAKE ONE TABLET BY MOUTH ONCE DIALY 90 tablet 3  . fish oil-omega-3 fatty acids 1000 MG capsule Take 1 g by mouth 3 (three) times daily.     . flecainide (TAMBOCOR) 100 MG tablet Take 2 tablets by mouth with onset of atrial fib. No more than once a week 6 tablet 0  . fluticasone (FLONASE) 50 MCG/ACT nasal spray USE 2 SPRAYS IN EACH NOSTRIL ONCE DAILY 16 g 0  . loratadine (CLARITIN) 10 MG tablet Take 10 mg by mouth daily.    . metoprolol tartrate (LOPRESSOR) 25 MG tablet TAKE 1/2 TABLET BY MOUTH TWICE DAILY 90 tablet 6  . pravastatin (PRAVACHOL) 40 MG tablet TAKE 1 TABLET BY MOUTH ONCE DAILY 90 tablet 1  . ranitidine (ZANTAC) 150 MG tablet Take 150 mg by mouth daily with supper.     . rivaroxaban (XARELTO) 20 MG TABS tablet Take 1 tablet (20 mg total) by mouth daily with supper. 30 tablet 0  . sertraline  (ZOLOFT) 25 MG tablet Take 1 tablet (25 mg total) by mouth daily. 90 tablet 3   No current facility-administered medications for this visit.    LABS/IMAGING: No results found for this or any previous visit (from the past 48 hour(s)). No results found.  VITALS: BP 112/58 mmHg  Pulse 54  Ht 5\' 5"  (1.651 m)  Wt 171 lb 4.8 oz (77.701 kg)  BMI 28.51 kg/m2  EXAM: General appearance: alert and no distress Neck:  no carotid bruit and no JVD Lungs: clear to auscultation bilaterally Heart: regular rate and rhythm, S1, S2 normal, no murmur, click, rub or gallop Abdomen: soft, non-tender; bowel sounds normal; no masses,  no organomegaly Extremities: extremities normal, atraumatic, no cyanosis or edema Pulses: 2+ and symmetric Skin: Skin color, texture, turgor normal. No rashes or lesions Neurologic: Grossly normal Psych: Mood, affect normal  EKG: Sinus bradycardia 54, nonspecific T wave changes  ASSESSMENT: 1. Paroxysmal atrial fibrillation - no significant recurrence, low CHADSVASC score of 1 (female >65) on Xarelto 2. Pocket pill flecainide strategy 3. Dyslipidemia- at goal  PLAN: 1.   Daisy Nelson says that she's been pretty stable with regard to her palpitations. Hopefully the Lopressor is helping this. She has not needed to take the flecainide. She remains on Zarrella toe with a low CHADSVASC or of 1 based on being female. So far she's not had any bleeding complications and she is very concerned about the risk of stroke, so this is a good strategy. Her cholesterol is been at goal. Plan to see her back annually or sooner as necessary.  Pixie Casino, MD, [facility] Attending Cardiologist [place] 01/15/2016, 1:28 PM

## 2016-01-15 NOTE — Patient Instructions (Signed)
Your physician wants you to follow-up in: 1 year with Dr. Hilty. You will receive a reminder letter in the mail two months in advance. If you don't receive a letter, please call our office to schedule the follow-up appointment.  

## 2016-02-01 ENCOUNTER — Ambulatory Visit (INDEPENDENT_AMBULATORY_CARE_PROVIDER_SITE_OTHER): Payer: PPO | Admitting: Internal Medicine

## 2016-02-01 VITALS — BP 130/80 | HR 56 | Temp 98.0°F | Resp 18 | Ht 64.5 in | Wt 172.5 lb

## 2016-02-01 DIAGNOSIS — J0100 Acute maxillary sinusitis, unspecified: Secondary | ICD-10-CM | POA: Diagnosis not present

## 2016-02-01 MED ORDER — CEFDINIR 300 MG PO CAPS
300.0000 mg | ORAL_CAPSULE | Freq: Two times a day (BID) | ORAL | Status: DC
Start: 2016-02-01 — End: 2016-03-24

## 2016-02-01 NOTE — Progress Notes (Signed)
Subjective:  By signing my name below, I, Moises Blood, attest that this documentation has been prepared under the direction and in the presence of Tami Lin, MD. Electronically Signed: Moises Blood, [place]. 02/01/2016 , 1:59 PM .  Patient was seen in Room 11 .   Patient ID: Daisy Nelson, female    DOB: Dec 18, 1945, 70 y.o.   MRN: [id number] Chief Complaint  Patient presents with  . URI    C/O raw throat, cheek pressure, & nasal congestion x 2 days. Has had a scratchy throat all week   HPI Daisy Nelson is a 70 y.o. female who presents to [facility] complaining of URI symptoms that has been ongoing throughout the week. She states that it started with scratch throat with a cough. She notes having nasal congestion and sinus pressure starting 2 days ago. She has multiple sick contacts at work. She denies taking any medications for this, as she has afib. She had great relief when taking a hot shower last night. She is allergic to penicillin.   Patient Active Problem List   Diagnosis Date Noted  . Hyperglycemia 03/26/2015  . PAF (paroxysmal atrial fibrillation) ([place]) 07/10/2013  . Atrial fibrillation with rapid ventricular response ([place]) 02/06/2013  . Family history of coronary artery bypass surgery 02/06/2013  . GERD (gastroesophageal reflux disease) 10/25/2012  . Allergy 10/25/2012  . Hyperlipidemia 10/25/2012  . Depression 10/25/2012  . Anxiety 10/25/2012  . MVP (mitral valve prolapse) 10/25/2012  . DJD (degenerative joint disease) of cervical spine 10/25/2012  . Nephrolithiasis 10/25/2012  . Colon polyps 10/25/2012    Current outpatient prescriptions:  .  estradiol (ESTRACE) 1 MG tablet, TAKE ONE TABLET BY MOUTH ONCE DIALY, Disp: 90 tablet, Rfl: 3 .  fexofenadine (ALLEGRA) 180 MG tablet, Take 180 mg by mouth at bedtime., Disp: , Rfl:  .  fish oil-omega-3 fatty acids 1000 MG capsule, Take 1 g by mouth 3 (three) times daily. , Disp: , Rfl:  .  flecainide (TAMBOCOR) 100  MG tablet, Take 2 tablets by mouth with onset of atrial fib. No more than once a week, Disp: 6 tablet, Rfl: 0 .  fluticasone (FLONASE) 50 MCG/ACT nasal spray, USE 2 SPRAYS IN EACH NOSTRIL ONCE DAILY, Disp: 16 g, Rfl: 0 .  metoprolol tartrate (LOPRESSOR) 25 MG tablet, TAKE 1/2 TABLET BY MOUTH TWICE DAILY, Disp: 90 tablet, Rfl: 6 .  pravastatin (PRAVACHOL) 40 MG tablet, TAKE 1 TABLET BY MOUTH ONCE DAILY, Disp: 90 tablet, Rfl: 1 .  ranitidine (ZANTAC) 150 MG tablet, Take 150 mg by mouth daily with supper. , Disp: , Rfl:  .  rivaroxaban (XARELTO) 20 MG TABS tablet, Take 1 tablet (20 mg total) by mouth daily with supper., Disp: 30 tablet, Rfl: 0 .  sertraline (ZOLOFT) 25 MG tablet, Take 1 tablet (25 mg total) by mouth daily., Disp: 90 tablet, Rfl: 3 .  cefdinir (OMNICEF) 300 MG capsule, Take 1 capsule (300 mg total) by mouth 2 (two) times daily., Disp: 20 capsule, Rfl: 0  Review of Systems  Constitutional: Positive for fatigue. Negative for fever, chills and appetite change.  HENT: Positive for congestion, rhinorrhea, sinus pressure and sore throat.   Respiratory: Positive for cough. Negative for shortness of breath and wheezing.   Gastrointestinal: Negative for nausea, vomiting and diarrhea.      Objective:   Physical Exam  Constitutional: She is oriented to person, place, and time. She appears well-developed and well-nourished. No distress.  HENT:  Head: Normocephalic and atraumatic.  Nose: Right sinus exhibits maxillary sinus tenderness. Left sinus exhibits maxillary sinus tenderness.  Boggy turbinates bilaterally Maxillary sinuses tender to light percussion  Eyes: EOM are normal. Pupils are equal, round, and reactive to light.  Neck: Neck supple.  Cardiovascular: Normal rate.   Pulmonary/Chest: Effort normal. No respiratory distress.  Musculoskeletal: Normal range of motion.  Neurological: She is alert and oriented to person, place, and time.  Skin: Skin is warm and dry.  Psychiatric:  She has a normal mood and affect. Her behavior is normal.  Nursing note and vitals reviewed.   BP 130/80 mmHg  Pulse 56  Temp(Src) 98 F (36.7 C) (Oral)  Resp 18  Ht 5' 4.5" (1.638 m)  Wt 172 lb 8 oz (78.245 kg)  BMI 29.16 kg/m2  SpO2 98%    Assessment & Plan:  I have completed the patient encounter in its entirety as documented by the scribe, with editing by me where necessary. Gianni Fuchs P. Laney Pastor, M.D. Acute maxillary sinusitis, recurrence not specified  Meds ordered this encounter  Medications  . cefdinir (OMNICEF) 300 MG capsule    Sig: Take 1 capsule (300 mg total) by mouth 2 (two) times daily.    Dispense:  20 capsule    Refill:  0

## 2016-02-06 ENCOUNTER — Ambulatory Visit (INDEPENDENT_AMBULATORY_CARE_PROVIDER_SITE_OTHER): Payer: PPO | Admitting: Physician Assistant

## 2016-02-06 VITALS — BP 118/70 | HR 69 | Temp 98.6°F | Resp 18 | Ht 64.5 in | Wt 172.8 lb

## 2016-02-06 DIAGNOSIS — J0100 Acute maxillary sinusitis, unspecified: Secondary | ICD-10-CM

## 2016-02-06 MED ORDER — IPRATROPIUM BROMIDE 0.03 % NA SOLN: 2.0000 | Freq: Two times a day (BID) | NASAL | Status: DC

## 2016-02-06 MED ORDER — GUAIFENESIN ER 1200 MG PO TB12: 1.0000 | ORAL_TABLET | Freq: Two times a day (BID) | ORAL | Status: DC | PRN

## 2016-02-06 NOTE — Progress Notes (Signed)
[facility] [address] [place] [address] [phone]  Date:  02/06/2016   Name:  Daisy Nelson   DOB:  March 20, 1946   MRN:  [id number]  PCP:  JEFFERY,CHELLE, PA-C    Chief Complaint: Follow-up and Cough   History of Present Illness:  This is a 70 y.o. female with PMH A fib, GERD, allergic rhinitis, anxiety, depression, MVP, HLD who is presenting for follow up sinusitis. She was seen 02/01/15 by Dr. Laney Pastor. At that time, symptoms present x 1 week. He prescribed omnicef. She still has 5 days left of the rx. She states she was feeling better and sinuses draining until today. Today sinuses are stopped up and "feel tight". She is very worried about getting worse again. Mild cough, dry, coughing d/t scratchy throat. No fever, chills, sob, wheezing. She does have a hx of allergies. Takes flonase and Facilities manager daily. She is not a smoker.  Review of Systems:  Review of Systems See HPI  Patient Active Problem List   Diagnosis Date Noted  . Hyperglycemia 03/26/2015  . PAF (paroxysmal atrial fibrillation) ([place]) 07/10/2013  . Atrial fibrillation with rapid ventricular response ([place]) 02/06/2013  . Family history of coronary artery bypass surgery 02/06/2013  . GERD (gastroesophageal reflux disease) 10/25/2012  . Allergy 10/25/2012  . Hyperlipidemia 10/25/2012  . Depression 10/25/2012  . Anxiety 10/25/2012  . MVP (mitral valve prolapse) 10/25/2012  . DJD (degenerative joint disease) of cervical spine 10/25/2012  . Nephrolithiasis 10/25/2012  . Colon polyps 10/25/2012    Prior to Admission medications   Medication Sig Start Date End Date Taking? Authorizing Provider  cefdinir (OMNICEF) 300 MG capsule Take 1 capsule (300 mg total) by mouth 2 (two) times daily. 02/01/16  Yes Leandrew Koyanagi, MD  estradiol (ESTRACE) 1 MG tablet TAKE ONE TABLET BY MOUTH ONCE DIALY 12/14/15  Yes Chelle Jeffery, PA-C  fexofenadine (ALLEGRA) 180 MG tablet Take 180 mg by mouth at  bedtime.   Yes Historical Provider, MD  fish oil-omega-3 fatty acids 1000 MG capsule Take 1 g by mouth 3 (three) times daily.    Yes Historical Provider, MD  flecainide (TAMBOCOR) 100 MG tablet Take 2 tablets by mouth with onset of atrial fib. No more than once a week 09/03/15  Yes Sherran Needs, NP  fluticasone [facility]) 50 MCG/ACT nasal spray USE 2 SPRAYS IN EACH NOSTRIL ONCE DAILY 12/27/15  Yes Chelle Jeffery, PA-C  metoprolol tartrate (LOPRESSOR) 25 MG tablet TAKE 1/2 TABLET BY MOUTH TWICE DAILY 10/08/15  Yes Sherran Needs, NP  pravastatin (PRAVACHOL) 40 MG tablet TAKE 1 TABLET BY MOUTH ONCE DAILY 11/19/15  Yes Chelle Jeffery, PA-C  ranitidine (ZANTAC) 150 MG tablet Take 150 mg by mouth daily with supper.    Yes Historical Provider, MD  rivaroxaban (XARELTO) 20 MG TABS tablet Take 1 tablet (20 mg total) by mouth daily with supper. 08/31/15  Yes Isaiah Serge, NP  sertraline (ZOLOFT) 25 MG tablet Take 1 tablet (25 mg total) by mouth daily. 10/25/15  Yes Harrison Mons, PA-C    Allergies  Allergen Reactions  . Adhesive [Tape]     Burn skin  . Erythromycin     Gi intolerance  . Naproxen Nausea And Vomiting    Abdominal pain   . Penicillins Hives    Past Surgical History  Procedure Laterality Date  . Tubal ligation  1977  . Cesarean section  1976  . Abdominal hysterectomy  1991  . Laparoscopy  1976  . Ganglion cyst excision      Left hand  . Cataract extraction, bilateral Bilateral 03/2013  . Transthoracic echocardiogram  01/2013    EF 55-60%, mod conc LVH; mild MR with mildly thickened leaflets; mild TR; PA peak pressure 39mmHg  . Nm myocar perf wall motion  01/2013    lexiscan - no pharmacologically induced ischemia, fixed defects in apical segments of anterior septum & inferior lateral wall     Social History  Substance Use Topics  . Smoking status: Former Smoker -- 0.50 packs/day for 36 years    Types: Cigarettes    Start date: 03/15/1963    Quit date: 01/08/2005  .  Smokeless tobacco: Never Used  . Alcohol Use: No    Family History  Problem Relation Age of Onset  . Stroke Mother   . Diabetes Father   . Heart disease Father     CABG  . Arthritis Father     C6-7  . Skin cancer Father     SCC  . Arthritis Brother     s/p THR    Medication list has been reviewed and updated.  Physical Examination:  Physical Exam  Constitutional: She is oriented to person, place, and time. She appears well-developed and well-nourished. No distress.  HENT:  Head: Normocephalic and atraumatic.  Right Ear: Hearing, tympanic membrane, external ear and ear canal normal.  Left Ear: Hearing, tympanic membrane, external ear and ear canal normal.  Nose: Right sinus exhibits maxillary sinus tenderness (mild). Right sinus exhibits no frontal sinus tenderness. Left sinus exhibits maxillary sinus tenderness (mild). Left sinus exhibits no frontal sinus tenderness.  Mouth/Throat: Uvula is midline, oropharynx is clear and moist and mucous membranes are normal.  Eyes: Conjunctivae and lids are normal. Right eye exhibits no discharge. Left eye exhibits no discharge. No scleral icterus.  Cardiovascular: Normal rate, regular rhythm, normal heart sounds and normal pulses.   No murmur heard. Pulmonary/Chest: Effort normal and breath sounds normal. No respiratory distress. She has no wheezes. She has no rhonchi. She has no rales.  Musculoskeletal: Normal range of motion.  Lymphadenopathy:       Head (right side): No submental, no submandibular and no tonsillar adenopathy present.       Head (left side): No submental, no submandibular and no tonsillar adenopathy present.    She has no cervical adenopathy.  Neurological: She is alert and oriented to person, place, and time.  Skin: Skin is warm, dry and intact. No lesion and no rash noted.  Psychiatric: She has a normal mood and affect. Her speech is normal and behavior is normal. Thought content normal.   BP 118/70 mmHg  Pulse 69   Temp(Src) 98.6 F (37 C) (Oral)  Resp 18  Ht 5' 4.5" (1.638 m)  Wt 172 lb 12.8 oz (78.382 kg)  BMI 29.21 kg/m2  SpO2 97%  Assessment and Plan:  1. Acute maxillary sinusitis, recurrence not specified She will take mucinex and use atrovent in addition to flonase. Counseled on hydration. Stop allegra for now since might be drying her out. May start back if allergy symptoms flare. Continue abx until finished. Counseled on home remedies for sinusitis. Return in 1 week if symptoms do not improve or at any time if symptoms worsen.  - Guaifenesin (MUCINEX MAXIMUM STRENGTH) 1200 MG TB12; Take 1 tablet (1,200 mg total) by mouth every 12 (twelve) hours as needed.  Dispense: 14 tablet; Refill: 1 - ipratropium (ATROVENT) 0.03 % nasal spray; Place 2  sprays into both nostrils 2 (two) times daily.  Dispense: 30 mL; Refill: 0   Dolores Ewing V. Drenda Freeze, MHS [facility]  02/06/2016

## 2016-02-06 NOTE — Patient Instructions (Signed)
If prescribed mucinex, take twice a day with plenty of water (64 oz per day). If prescribed nasal spray, use twice a day. Hot showers, breathing in steam from shower or pot of boiling onions, eating spicy food and neti pot with sterile water can all help your sinuses drain. Continue antibiotic until finished. Stop allegra until your sinus infection is improved -- start back if your allergy symptoms become too bothersome Continue flonase If you are not getting better after completing antibiotic, return to clinic

## 2016-03-18 ENCOUNTER — Other Ambulatory Visit: Payer: Self-pay | Admitting: Physician Assistant

## 2016-03-24 ENCOUNTER — Encounter: Payer: PRIVATE HEALTH INSURANCE | Admitting: Physician Assistant

## 2016-03-24 ENCOUNTER — Encounter: Payer: Self-pay | Admitting: Physician Assistant

## 2016-03-24 ENCOUNTER — Ambulatory Visit (INDEPENDENT_AMBULATORY_CARE_PROVIDER_SITE_OTHER): Payer: PPO | Admitting: Physician Assistant

## 2016-03-24 VITALS — BP 113/67 | HR 53 | Temp 98.4°F | Resp 16 | Ht 64.5 in | Wt 171.0 lb

## 2016-03-24 DIAGNOSIS — K635 Polyp of colon: Secondary | ICD-10-CM

## 2016-03-24 DIAGNOSIS — E785 Hyperlipidemia, unspecified: Secondary | ICD-10-CM

## 2016-03-24 DIAGNOSIS — R739 Hyperglycemia, unspecified: Secondary | ICD-10-CM | POA: Diagnosis not present

## 2016-03-24 DIAGNOSIS — T7840XD Allergy, unspecified, subsequent encounter: Secondary | ICD-10-CM

## 2016-03-24 LAB — HEMOGLOBIN A1C
Hgb A1c MFr Bld: 6.3 % — ABNORMAL HIGH (ref <5.7)
Mean Plasma Glucose: 134 mg/dL

## 2016-03-24 LAB — LIPID PANEL
Cholesterol: 181 mg/dL (ref 125–200)
HDL: 51 mg/dL (ref >=46)
LDL Cholesterol: 94 mg/dL (ref <130)
Total CHOL/HDL Ratio: 3.5 Ratio (ref <=5.0)
Triglycerides: 182 mg/dL — ABNORMAL HIGH (ref <150)
VLDL: 36 mg/dL — ABNORMAL HIGH (ref <30)

## 2016-03-24 LAB — COMPREHENSIVE METABOLIC PANEL
ALT: 17 U/L (ref 6–29)
AST: 16 U/L (ref 10–35)
Albumin: 3.9 g/dL (ref 3.6–5.1)
Alkaline Phosphatase: 71 U/L (ref 33–130)
BUN: 13 mg/dL (ref 7–25)
CO2: 22 mmol/L (ref 20–31)
Calcium: 8.9 mg/dL (ref 8.6–10.4)
Chloride: 106 mmol/L (ref 98–110)
Creat: 0.74 mg/dL (ref 0.50–0.99)
Glucose, Bld: 111 mg/dL — ABNORMAL HIGH (ref 65–99)
Potassium: 4.5 mmol/L (ref 3.5–5.3)
Sodium: 138 mmol/L (ref 135–146)
Total Bilirubin: 0.5 mg/dL (ref 0.2–1.2)
Total Protein: 6.8 g/dL (ref 6.1–8.1)

## 2016-03-24 MED ORDER — FLUTICASONE PROPIONATE 50 MCG/ACT NA SUSP: 2.0000 | Freq: Every day | NASAL | Status: DC

## 2016-03-24 NOTE — Progress Notes (Signed)
Patient ID: Daisy Nelson, female    DOB: 02/26/1946, 70 y.o.   MRN: [id number]  PCP: Wynne Dust  Subjective:   Chief Complaint  Patient presents with  . Follow-up  . Hyperlipidemia    HPI Presents for hyperlipidemia and HTN follow up.  She has been doing well since the last time she was seen. She will be retiring Friday 03/27/16, and she is very excited. She is looking into the options for volunteering.  She has no complaints. Denies any afib palpitation. She was seen at the [facility] in March for URI symptoms. But that has since resolved.   Patient was suppose to have colonoscopy performed in January, but did not make the appointment. She will make the appointment soon.  Patient does not have a specific diet or exercise routine. After she retires she plans to make time for diet and exercise.   Review of Systems Constitutional: Negative for fever, chills and fatigue.  HENT: Negative.  Eyes: Negative.  Respiratory: Negative for shortness of breath.  Cardiovascular: Negative for chest pain, palpitations and leg swelling.  Gastrointestinal: Negative for nausea, abdominal pain, diarrhea and constipation.  Genitourinary: Negative.  Neurological: Negative for dizziness, syncope, weakness, light-headedness and headaches.     Patient Active Problem List   Diagnosis Date Noted  . Hyperglycemia 03/26/2015  . PAF (paroxysmal atrial fibrillation) ([place]) 07/10/2013  . Atrial fibrillation with rapid ventricular response ([place]) 02/06/2013  . Family history of coronary artery bypass surgery 02/06/2013  . GERD (gastroesophageal reflux disease) 10/25/2012  . Allergy 10/25/2012  . Hyperlipidemia 10/25/2012  . Depression 10/25/2012  . Anxiety 10/25/2012  . MVP (mitral valve prolapse) 10/25/2012  . DJD (degenerative joint disease) of cervical spine 10/25/2012  . Nephrolithiasis 10/25/2012  . Colon polyps 10/25/2012     Prior to Admission medications   Medication Sig Start  Date End Date Taking? Authorizing Provider  estradiol (ESTRACE) 1 MG tablet TAKE ONE TABLET BY MOUTH ONCE DIALY 12/14/15   Brittanie Dosanjh, PA-C  fexofenadine (ALLEGRA) 180 MG tablet Take 180 mg by mouth at bedtime.    Historical Provider, MD  flecainide (TAMBOCOR) 100 MG tablet Take 2 tablets by mouth with onset of atrial fib. No more than once a week 09/03/15   Sherran Needs, NP  fluticasone [facility]) 50 MCG/ACT nasal spray USE 2 SPRAYS IN EACH NOSTRIL ONCE DAILY 03/18/16   Tehila Sokolow, PA-C  metoprolol tartrate (LOPRESSOR) 25 MG tablet TAKE 1/2 TABLET BY MOUTH TWICE DAILY 10/08/15   Sherran Needs, NP  pravastatin (PRAVACHOL) 40 MG tablet TAKE 1 TABLET BY MOUTH ONCE DAILY 11/19/15   Jeffry Vogelsang, PA-C  rivaroxaban (XARELTO) 20 MG TABS tablet Take 1 tablet (20 mg total) by mouth daily with supper. 08/31/15   Isaiah Serge, NP  sertraline (ZOLOFT) 25 MG tablet Take 1 tablet (25 mg total) by mouth daily. 10/25/15   Harrison Mons, PA-C     Allergies  Allergen Reactions  . Adhesive [Tape]     Burn skin  . Erythromycin     Gi intolerance  . Naproxen Nausea And Vomiting    Abdominal pain   . Penicillins Hives       Objective:  Physical Exam  Constitutional: She is oriented to person, place, and time. She appears well-developed and well-nourished. She is active and cooperative. No distress.  BP 113/67 mmHg  Pulse 53  Temp(Src) 98.4 F (36.9 C)  Resp 16  Ht 5' 4.5" (1.638 m)  Wt 171 lb (77.565  kg)  BMI 28.91 kg/m2  HENT:  Head: Normocephalic and atraumatic.  Right Ear: Hearing normal.  Left Ear: Hearing normal.  Eyes: Conjunctivae are normal. No scleral icterus.  Neck: Normal range of motion. Neck supple. No thyromegaly present.  Cardiovascular: Regular rhythm and normal heart sounds.  Bradycardia present.   Pulses:      Radial pulses are 2+ on the right side, and 2+ on the left side.  Pulmonary/Chest: Effort normal and breath sounds normal.  Lymphadenopathy:       Head  (right side): No tonsillar, no preauricular, no posterior auricular and no occipital adenopathy present.       Head (left side): No tonsillar, no preauricular, no posterior auricular and no occipital adenopathy present.    She has no cervical adenopathy.       Right: No supraclavicular adenopathy present.       Left: No supraclavicular adenopathy present.  Neurological: She is alert and oriented to person, place, and time. No sensory deficit.  Skin: Skin is warm, dry and intact. No rash noted. No cyanosis or erythema. Nails show no clubbing.  Psychiatric: She has a normal mood and affect. Her speech is normal and behavior is normal.           Assessment & Plan:   1. Hyperlipidemia Await lab results. Healthy eating, regular exercise. - Comprehensive metabolic panel - Lipid panel  2. Hyperglycemia Has been normal. Continue to monitor.  - Comprehensive metabolic panel - Hemoglobin A1c  3. Colon polyps Reminded again to schedule her colonoscopy (last in 2011)  4. Allergy, subsequent encounter Stable. Continue current treatment. - fluticasone (FLONASE) 50 MCG/ACT nasal spray; Place 2 sprays into both nostrils daily.  Dispense: 16 g; Refill: 12   Fara Chute, PA-C Physician Assistant-Certified [facility]

## 2016-03-24 NOTE — Progress Notes (Signed)
Subjective:    Patient ID: GENOLA POW, female    DOB: Mar 16, 1946, 70 y.o.   MRN: [id number]  Chief Complaint  Patient presents with  . Follow-up  . Hyperlipidemia    HPI  Patient presents for hyperlipidemia and HTN follow up.  She has been doing well since the last time she was seen. She will be retiring Friday 03/27/16, and she is very excited.  She has no complaints. Denies any afib palpitation. She was seen at the [facility] in March for URI symptoms. But that has since resolved.   Patient was suppose to have colonoscopy performed in January, but did not make the appointment. She will make the appointment soon.  Patient does not have a specific diet or exercise routine. After she retires she plans to make time for diet and exercise.  Current Outpatient Prescriptions on File Prior to Visit  Medication Sig Dispense Refill  . estradiol (ESTRACE) 1 MG tablet TAKE ONE TABLET BY MOUTH ONCE DIALY 90 tablet 3  . fexofenadine (ALLEGRA) 180 MG tablet Take 180 mg by mouth at bedtime.    . flecainide (TAMBOCOR) 100 MG tablet Take 2 tablets by mouth with onset of atrial fib. No more than once a week 6 tablet 0  . metoprolol tartrate (LOPRESSOR) 25 MG tablet TAKE 1/2 TABLET BY MOUTH TWICE DAILY 90 tablet 6  . pravastatin (PRAVACHOL) 40 MG tablet TAKE 1 TABLET BY MOUTH ONCE DAILY 90 tablet 1  . rivaroxaban (XARELTO) 20 MG TABS tablet Take 1 tablet (20 mg total) by mouth daily with supper. 30 tablet 0  . sertraline (ZOLOFT) 25 MG tablet Take 1 tablet (25 mg total) by mouth daily. 90 tablet 3   No current facility-administered medications on file prior to visit.   Allergies  Allergen Reactions  . Adhesive [Tape]     Burn skin  . Erythromycin     Gi intolerance  . Naproxen Nausea And Vomiting    Abdominal pain   . Penicillins Hives     Review of Systems  Constitutional: Negative for fever, chills and fatigue.  HENT: Negative.   Eyes: Negative.   Respiratory: Negative for shortness  of breath.   Cardiovascular: Negative for chest pain, palpitations and leg swelling.  Gastrointestinal: Negative for nausea, abdominal pain, diarrhea and constipation.  Genitourinary: Negative.   Neurological: Negative for dizziness, syncope, weakness, light-headedness and headaches.       Objective:   Physical Exam  Constitutional: She is oriented to person, place, and time. She appears well-developed and well-nourished. No distress.  Eyes: Conjunctivae are normal. Pupils are equal, round, and reactive to light.  Neck: Normal range of motion. Neck supple. No thyromegaly present.  Cardiovascular: Normal rate, regular rhythm, normal heart sounds and intact distal pulses.   Pulmonary/Chest: Effort normal and breath sounds normal.  Lymphadenopathy:    She has no cervical adenopathy.  Neurological: She is alert and oriented to person, place, and time.  Skin: Skin is warm and dry.  Psychiatric: She has a normal mood and affect. Her behavior is normal. Judgment and thought content normal.          Assessment & Plan:  1. Hyperlipidemia Awaiting lab results. - Comprehensive metabolic panel - Lipid panel  2. Hyperglycemia Awaiting lab results. - Comprehensive metabolic panel - Hemoglobin A1c  3. Colon polyps Patient encouraged to schedule colonoscopy.  4. Allergy, subsequent encounter Refill medication. Continue to take Allegra as needed. - fluticasone (FLONASE) 50 MCG/ACT nasal spray; Place 2  sprays into both nostrils daily.  Dispense: 16 g; Refill: 12  Tyler Jair Lindblad PA-S 03/24/2016

## 2016-03-24 NOTE — Patient Instructions (Addendum)
  SCHEDULE YOUR COLONOSCOPY!   IF you received an x-ray today, you will receive an invoice from [facility] Radiology. Please contact [facility]iology at [phone] with questions or concerns regarding your invoice.   IF you received labwork today, you will receive an invoice from [employer]. Please contact [facility] at [phone] with questions or concerns regarding your invoice.   Our billing staff will not be able to assist you with questions regarding bills from these companies.  You will be contacted with the lab results as soon as they are available. The fastest way to get your results is to activate your My Chart account. Instructions are located on the last page of this paperwork. If you have not heard from [place] regarding the results in 2 weeks, please contact this office.

## 2016-05-25 ENCOUNTER — Other Ambulatory Visit: Payer: Self-pay | Admitting: Physician Assistant

## 2016-06-16 ENCOUNTER — Other Ambulatory Visit: Payer: Self-pay | Admitting: Physician Assistant

## 2016-06-16 DIAGNOSIS — Z1231 Encounter for screening mammogram for malignant neoplasm of breast: Secondary | ICD-10-CM

## 2016-06-17 ENCOUNTER — Telehealth: Payer: Self-pay

## 2016-06-17 DIAGNOSIS — H35371 Puckering of macula, right eye: Secondary | ICD-10-CM | POA: Diagnosis not present

## 2016-06-17 DIAGNOSIS — Z961 Presence of intraocular lens: Secondary | ICD-10-CM | POA: Diagnosis not present

## 2016-06-17 DIAGNOSIS — H43812 Vitreous degeneration, left eye: Secondary | ICD-10-CM | POA: Diagnosis not present

## 2016-06-17 DIAGNOSIS — H43392 Other vitreous opacities, left eye: Secondary | ICD-10-CM | POA: Diagnosis not present

## 2016-06-17 NOTE — Telephone Encounter (Signed)
Got a fax for PA for estradiol tabs. I called pharm because an older email in [place] states that pt wanted to be switched to estradiol because it is on the $4 list at [place]. I called [facility] and they verified that pt pays cash for this Rx and no PA is needed.

## 2016-07-10 ENCOUNTER — Ambulatory Visit
Admission: RE | Admit: 2016-07-10 | Discharge: 2016-07-10 | Disposition: A | Discharge status: Home / Self Care | Payer: PPO | Source: Ambulatory Visit | Attending: Physician Assistant | Admitting: Physician Assistant

## 2016-07-10 DIAGNOSIS — Z1231 Encounter for screening mammogram for malignant neoplasm of breast: Secondary | ICD-10-CM

## 2016-07-15 ENCOUNTER — Encounter: Payer: Self-pay | Admitting: Internal Medicine

## 2016-07-15 ENCOUNTER — Encounter (INDEPENDENT_AMBULATORY_CARE_PROVIDER_SITE_OTHER): Payer: PPO | Admitting: Ophthalmology

## 2016-07-15 DIAGNOSIS — H35371 Puckering of macula, right eye: Secondary | ICD-10-CM

## 2016-07-15 DIAGNOSIS — H43813 Vitreous degeneration, bilateral: Secondary | ICD-10-CM

## 2016-07-18 ENCOUNTER — Ambulatory Visit (INDEPENDENT_AMBULATORY_CARE_PROVIDER_SITE_OTHER): Payer: PPO

## 2016-07-18 ENCOUNTER — Ambulatory Visit (INDEPENDENT_AMBULATORY_CARE_PROVIDER_SITE_OTHER): Payer: PPO | Admitting: Physician Assistant

## 2016-07-18 VITALS — BP 136/72 | HR 60 | Temp 98.0°F | Resp 18 | Ht 64.5 in | Wt 164.0 lb

## 2016-07-18 DIAGNOSIS — R1011 Right upper quadrant pain: Secondary | ICD-10-CM | POA: Diagnosis not present

## 2016-07-18 DIAGNOSIS — R142 Eructation: Secondary | ICD-10-CM

## 2016-07-18 NOTE — Patient Instructions (Addendum)
Continue the ranitidine. Try OTC simethicone (Bean-O and Mylanta Gas) with meals and at bedtime. If the symptoms persist, we'll plan for you to follow-up with GI.     IF you received an x-ray today, you will receive an invoice from [facility] Radiology. Please contact [facility] at [phone] with questions or concerns regarding your invoice.   IF you received labwork today, you will receive an invoice from [employer]. Please contact [facility] at [phone] with questions or concerns regarding your invoice.   Our billing staff will not be able to assist you with questions regarding bills from these companies.  You will be contacted with the lab results as soon as they are available. The fastest way to get your results is to activate your My Chart account. Instructions are located on the last page of this paperwork. If you have not heard from [place] regarding the results in 2 weeks, please contact this office.

## 2016-07-18 NOTE — Progress Notes (Signed)
Patient ID: Daisy Nelson, female    DOB: 05/26/1946, 70 y.o.   MRN: [id number]  PCP: Harrison Mons, PA-C  Subjective:   Chief Complaint  Patient presents with  . GI Problem    burping alo  . Abdominal Pain    right side    HPI Presents for evaluation of increased belching and abdominal pain.  When the pain occurs, it's a 3-4/10, and increases the need to belch. Belching several times helps reduce the pain. It's a "fullness" feeling.  She will be having a vitrectomy on the RIGHT eye 08/25/2016, and she wants to make sure that this won't cause problems. Tempie Hoist, MD will perform the procedure.  No nausea, vomiting. No change in BM. No melena, constipation. No increased flatulence. No weight changes. No fatigue.  Tried to stop the ranitidine, but the acid feeling in her throat recurred, so she resumed it.     Review of Systems As above.    Patient Active Problem List   Diagnosis Date Noted  . Hyperglycemia 03/26/2015  . PAF (paroxysmal atrial fibrillation) ([place]) 07/10/2013  . Atrial fibrillation with rapid ventricular response ([place]) 02/06/2013  . Family history of coronary artery bypass surgery 02/06/2013  . GERD (gastroesophageal reflux disease) 10/25/2012  . Allergy 10/25/2012  . Hyperlipidemia 10/25/2012  . Depression 10/25/2012  . Anxiety 10/25/2012  . MVP (mitral valve prolapse) 10/25/2012  . DJD (degenerative joint disease) of cervical spine 10/25/2012  . Nephrolithiasis 10/25/2012  . Colon polyps 10/25/2012     Prior to Admission medications   Medication Sig Start Date End Date Taking? Authorizing Provider  estradiol (ESTRACE) 1 MG tablet TAKE ONE TABLET BY MOUTH ONCE DIALY 12/14/15  Yes Ginette Bradway, PA-C  fexofenadine (ALLEGRA) 180 MG tablet Take 180 mg by mouth at bedtime.   Yes Historical Provider, MD  flecainide (TAMBOCOR) 100 MG tablet Take 2 tablets by mouth with onset of atrial fib. No more than once a week 09/03/15  Yes Sherran Needs, NP  fluticasone [facility]) 50 MCG/ACT nasal spray Place 2 sprays into both nostrils daily. 03/24/16  Yes Riyah Bardon, PA-C  metoprolol tartrate (LOPRESSOR) 25 MG tablet TAKE 1/2 TABLET BY MOUTH TWICE DAILY 10/08/15  Yes Sherran Needs, NP  pravastatin (PRAVACHOL) 40 MG tablet TAKE ONE BY MOUTH EACH DAY. 05/25/16  Yes Aeliana Spates, PA-C  ranitidine (ZANTAC) 150 MG tablet Take 150 mg by mouth 2 (two) times daily.   Yes Historical Provider, MD  rivaroxaban (XARELTO) 20 MG TABS tablet Take 1 tablet (20 mg total) by mouth daily with supper. 08/31/15  Yes Isaiah Serge, NP  sertraline (ZOLOFT) 25 MG tablet Take 1 tablet (25 mg total) by mouth daily. 10/25/15  Yes Harrison Mons, PA-C     Allergies  Allergen Reactions  . Adhesive [Tape]     Burn skin  . Erythromycin     Gi intolerance  . Naproxen Nausea And Vomiting    Abdominal pain   . Penicillins Hives       Objective:  Physical Exam  Constitutional: She is oriented to person, place, and time. She appears well-developed and well-nourished. She is active and cooperative. No distress.  BP 136/72 (BP Location: Right Arm, Patient Position: Sitting, Cuff Size: Small)   Pulse 60   Temp 98 F (36.7 C) (Oral)   Resp 18   Ht 5' 4.5" (1.638 m)   Wt 164 lb (74.4 kg)   SpO2 95%   BMI 27.72 kg/m  HENT:  Head: Normocephalic and atraumatic.  Right Ear: Hearing normal.  Left Ear: Hearing normal.  Eyes: Conjunctivae are normal. No scleral icterus.  Neck: Normal range of motion. Neck supple. No thyromegaly present.  Cardiovascular: Normal rate, regular rhythm and normal heart sounds.   Pulses:      Radial pulses are 2+ on the right side, and 2+ on the left side.  Pulmonary/Chest: Effort normal and breath sounds normal.  Abdominal: Normal appearance and bowel sounds are normal. There is no hepatosplenomegaly. There is no tenderness.  Lymphadenopathy:       Head (right side): No tonsillar, no preauricular, no posterior auricular  and no occipital adenopathy present.       Head (left side): No tonsillar, no preauricular, no posterior auricular and no occipital adenopathy present.    She has no cervical adenopathy.       Right: No supraclavicular adenopathy present.       Left: No supraclavicular adenopathy present.  Neurological: She is alert and oriented to person, place, and time. No sensory deficit.  Skin: Skin is warm, dry and intact. No rash noted. No cyanosis or erythema. Nails show no clubbing.  Psychiatric: She has a normal mood and affect. Her speech is normal and behavior is normal.       Dg Abd Acute W/chest  Result Date: 07/18/2016 CLINICAL DATA:  RUQ abdominal discomfort x3-4weeks associated with increased belching; non-tender on exam. Pt denies nausea/vomiting. EXAM: DG ABDOMEN ACUTE W/ 1V CHEST COMPARISON:  Chest radiograph 02/06/2013 FINDINGS: Frontal view of the chest demonstrates midline trachea. Normal heart size. No pleural effusion or pneumothorax. Clear lungs. Upright and supine views of the abdomen and pelvis demonstrate no free intraperitoneal air or significant air-fluid levels. No gaseous distention of bowel loops. No abnormal abdominal calcifications. No appendicolith. Distal gas and stool. Probable phleboliths in the pelvis. IMPRESSION: No acute findings. Electronically Signed   By: Abigail Miyamoto M.D.   On: 07/18/2016 10:56       Assessment & Plan:   1. Abdominal pain, right upper quadrant 2. Belching Uncertain etiology, but suspect simple, transient increased bowel gas. Trial of simethicone. If not improved in the next week, let me know. Will plan referral to GI. - DG Abd Acute W/Chest; Future     Fara Chute, PA-C Physician Assistant-Certified [facility]

## 2016-07-28 ENCOUNTER — Telehealth: Payer: Self-pay | Admitting: Internal Medicine

## 2016-07-28 NOTE — Telephone Encounter (Signed)
[facility] requests clearance:   1. Type of surgery: retina surgery, vitrectomy under general anesthesia 2. Date of surgery: 08/25/2016 3. Surgeon: Dr. Tempie Hoist 4. Medications that need to be held & how long: xarelto - 5 days prior 5. Fax and/or Phone: (p) [phone]  (f) [phone]  Request for any pertinent medical info (most recent cardiac studies, echo, stress testing, cardiac cath results) - all in EPIC - office requesting clearance is affiliated w/[facility]

## 2016-07-29 NOTE — Telephone Encounter (Signed)
Ok to hold Xarelto - no need to hold more than 2 days prior to surgery. Restart when feasible afterwards. Low risk for surgery.  Dr. Lemmie Evens

## 2016-07-30 NOTE — Telephone Encounter (Signed)
Routed clearance & last OV via EPIC to Dr. Tempie Hoist. No recent cardiac studies

## 2016-08-10 DIAGNOSIS — M940 Chondrocostal junction syndrome [Tietze]: Secondary | ICD-10-CM | POA: Diagnosis not present

## 2016-08-10 DIAGNOSIS — Z86010 Personal history of colonic polyps: Secondary | ICD-10-CM | POA: Diagnosis not present

## 2016-08-10 DIAGNOSIS — I4891 Unspecified atrial fibrillation: Secondary | ICD-10-CM | POA: Diagnosis not present

## 2016-08-13 ENCOUNTER — Telehealth: Payer: Self-pay | Admitting: *Deleted

## 2016-08-13 NOTE — Telephone Encounter (Signed)
Pt is needing clearance for colonoscopy 09/24/16. They are also requesting clearance for the pt to hold xarelto for 5 days prior to the procedure. Will forward to dr hilty to review and advise.

## 2016-08-16 NOTE — Telephone Encounter (Signed)
Ok to hold Xarelto for 3 days prior to colonoscopy. Low risk for procedure.  Dr. Lemmie Evens

## 2016-08-17 ENCOUNTER — Other Ambulatory Visit: Payer: Self-pay | Admitting: Physician Assistant

## 2016-08-17 NOTE — Telephone Encounter (Signed)
Will forward this note to the number provided. 

## 2016-08-24 ENCOUNTER — Encounter (HOSPITAL_COMMUNITY): Payer: Self-pay | Admitting: *Deleted

## 2016-08-24 NOTE — H&P (Signed)
Daisy Nelson is an 70 y.o. female.   Chief Complaint:loss of vision for 2 months right eye HPI: notes blurred and distorted vision right eye  Past Medical History:  Diagnosis Date  . AF (atrial fibrillation) ([place])    AF with RVR 01/2013  . Allergy   . Anxiety   . Atrial fibrillation with RVR ([place]) 08/31/2015  . Colon polyps   . Depression   . DJD (degenerative joint disease) of cervical spine   . Family history of CABG   . GERD (gastroesophageal reflux disease)   . Hyperglycemia   . Hyperlipidemia   . Jaw pain 08/31/2015  . Nephrolithiasis     Past Surgical History:  Procedure Laterality Date  . ABDOMINAL HYSTERECTOMY  1991  . CATARACT EXTRACTION, BILATERAL Bilateral 03/2013  . CESAREAN SECTION  1976  . GANGLION CYST EXCISION     Left hand  . LAPAROSCOPY  1976  . NM MYOCAR PERF WALL MOTION  01/2013   lexiscan - no pharmacologically induced ischemia, fixed defects in apical segments of anterior septum & inferior lateral wall   . TRANSTHORACIC ECHOCARDIOGRAM  01/2013   EF 55-60%, mod conc LVH; mild MR with mildly thickened leaflets; mild TR; PA peak pressure 60mmHg  . TUBAL LIGATION  1977    Family History  Problem Relation Age of Onset  . Stroke Mother   . Diabetes Father   . Heart disease Father     CABG  . Arthritis Father     C6-7  . Skin cancer Father     SCC  . Arthritis Brother     s/p THR   Social History:  reports that she quit smoking about 11 years ago. Her smoking use included Cigarettes. She started smoking about 53 years ago. She has a 18.00 pack-year smoking history. She has never used smokeless tobacco. She reports that she does not drink alcohol or use drugs.  Allergies:  Allergies  Allergen Reactions  . Adhesive [Tape]     Burn skin  . Erythromycin     Gi intolerance  . Naproxen Nausea And Vomiting    Abdominal pain   . Penicillins Hives    Has patient had a PCN reaction causing immediate rash, facial/tongue/throat swelling, SOB or  lightheadedness with hypotension: UNKNOWN Has patient had a PCN reaction causing severe rash involving mucus membranes or skin necrosis: No Has patient had a PCN reaction that required hospitalization No Has patient had a PCN reaction occurring within the last 10 years: No If all of the above answers are "NO", then may proceed with Cephalosporin use.     No prescriptions prior to admission.    Review of systems otherwise negative  There were no vitals taken for this visit.  Physical exam: Mental status: oriented x3. Eyes: See eye exam associated with this date of surgery in media tab.  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  Daisy Nelson 08/24/2016, 12:07 PM

## 2016-08-24 NOTE — Progress Notes (Signed)
Dr Zigmund Daniel instructed patient to arrive at his office at 7:00 am on September 26.  Patient reported that Dr Zigmund Daniel  Instructed patient to not take any medications in the am and to stop Xarlto 5 days prior to surgery.  Mrs Gabrielle Dare stopped Xarelto Thursday, September 21st.

## 2016-08-25 ENCOUNTER — Encounter (INDEPENDENT_AMBULATORY_CARE_PROVIDER_SITE_OTHER): Payer: PPO | Admitting: Ophthalmology

## 2016-08-25 ENCOUNTER — Ambulatory Visit (HOSPITAL_COMMUNITY): Payer: PPO | Admitting: Anesthesiology

## 2016-08-25 ENCOUNTER — Encounter (HOSPITAL_COMMUNITY): Payer: Self-pay | Admitting: *Deleted

## 2016-08-25 ENCOUNTER — Encounter (HOSPITAL_COMMUNITY)
Admission: RE | Disposition: A | Discharge status: Home / Self Care | Payer: Self-pay | Source: Ambulatory Visit | Attending: Ophthalmology

## 2016-08-25 ENCOUNTER — Ambulatory Visit (HOSPITAL_COMMUNITY)
Admission: RE | Admit: 2016-08-25 | Discharge: 2016-08-26 | Disposition: A | Discharge status: Home / Self Care | Payer: PPO | Source: Ambulatory Visit | Attending: Ophthalmology | Admitting: Ophthalmology

## 2016-08-25 DIAGNOSIS — F419 Anxiety disorder, unspecified: Secondary | ICD-10-CM | POA: Diagnosis not present

## 2016-08-25 DIAGNOSIS — H35341 Macular cyst, hole, or pseudohole, right eye: Secondary | ICD-10-CM | POA: Diagnosis not present

## 2016-08-25 DIAGNOSIS — Z880 Allergy status to penicillin: Secondary | ICD-10-CM | POA: Insufficient documentation

## 2016-08-25 DIAGNOSIS — F329 Major depressive disorder, single episode, unspecified: Secondary | ICD-10-CM | POA: Insufficient documentation

## 2016-08-25 DIAGNOSIS — Z9841 Cataract extraction status, right eye: Secondary | ICD-10-CM | POA: Diagnosis not present

## 2016-08-25 DIAGNOSIS — K219 Gastro-esophageal reflux disease without esophagitis: Secondary | ICD-10-CM | POA: Insufficient documentation

## 2016-08-25 DIAGNOSIS — Z9842 Cataract extraction status, left eye: Secondary | ICD-10-CM | POA: Insufficient documentation

## 2016-08-25 DIAGNOSIS — H35371 Puckering of macula, right eye: Secondary | ICD-10-CM

## 2016-08-25 DIAGNOSIS — Z87891 Personal history of nicotine dependence: Secondary | ICD-10-CM | POA: Insufficient documentation

## 2016-08-25 DIAGNOSIS — I4891 Unspecified atrial fibrillation: Secondary | ICD-10-CM | POA: Diagnosis not present

## 2016-08-25 DIAGNOSIS — H3589 Other specified retinal disorders: Secondary | ICD-10-CM | POA: Diagnosis not present

## 2016-08-25 DIAGNOSIS — Z90710 Acquired absence of both cervix and uterus: Secondary | ICD-10-CM | POA: Insufficient documentation

## 2016-08-25 DIAGNOSIS — Z886 Allergy status to analgesic agent status: Secondary | ICD-10-CM | POA: Diagnosis not present

## 2016-08-25 DIAGNOSIS — Z881 Allergy status to other antibiotic agents status: Secondary | ICD-10-CM | POA: Insufficient documentation

## 2016-08-25 DIAGNOSIS — Z808 Family history of malignant neoplasm of other organs or systems: Secondary | ICD-10-CM | POA: Diagnosis not present

## 2016-08-25 DIAGNOSIS — Z86010 Personal history of colonic polyps: Secondary | ICD-10-CM | POA: Diagnosis not present

## 2016-08-25 DIAGNOSIS — M5030 Other cervical disc degeneration, unspecified cervical region: Secondary | ICD-10-CM | POA: Diagnosis not present

## 2016-08-25 DIAGNOSIS — Z823 Family history of stroke: Secondary | ICD-10-CM | POA: Insufficient documentation

## 2016-08-25 DIAGNOSIS — Z87442 Personal history of urinary calculi: Secondary | ICD-10-CM | POA: Diagnosis not present

## 2016-08-25 DIAGNOSIS — Z833 Family history of diabetes mellitus: Secondary | ICD-10-CM | POA: Diagnosis not present

## 2016-08-25 DIAGNOSIS — Z888 Allergy status to other drugs, medicaments and biological substances status: Secondary | ICD-10-CM | POA: Insufficient documentation

## 2016-08-25 DIAGNOSIS — Z8249 Family history of ischemic heart disease and other diseases of the circulatory system: Secondary | ICD-10-CM | POA: Diagnosis not present

## 2016-08-25 DIAGNOSIS — E785 Hyperlipidemia, unspecified: Secondary | ICD-10-CM | POA: Insufficient documentation

## 2016-08-25 DIAGNOSIS — H43813 Vitreous degeneration, bilateral: Secondary | ICD-10-CM

## 2016-08-25 HISTORY — PX: GAS/FLUID EXCHANGE: SHX5334

## 2016-08-25 HISTORY — PX: MEMBRANE PEEL: SHX5967

## 2016-08-25 HISTORY — DX: Pneumonia, unspecified organism: J18.9

## 2016-08-25 HISTORY — DX: Personal history of urinary calculi: Z87.442

## 2016-08-25 HISTORY — DX: Adverse effect of unspecified anesthetic, initial encounter: T41.45XA

## 2016-08-25 HISTORY — DX: Nausea with vomiting, unspecified: R11.2

## 2016-08-25 HISTORY — PX: 25 GAUGE PARS PLANA VITRECTOMY WITH 20 GAUGE MVR PORT FOR MACULAR HOLE: SHX6096

## 2016-08-25 HISTORY — DX: Nausea with vomiting, unspecified: Z98.890

## 2016-08-25 HISTORY — PX: PARS PLANA VITRECTOMY: SHX2166

## 2016-08-25 HISTORY — PX: IRIDECTOMY: SHX1848

## 2016-08-25 LAB — BASIC METABOLIC PANEL
Anion gap: 7 (ref 5–15)
BUN: 10 mg/dL (ref 6–20)
CO2: 24 mmol/L (ref 22–32)
Calcium: 9 mg/dL (ref 8.9–10.3)
Chloride: 108 mmol/L (ref 101–111)
Creatinine, Ser: 0.79 mg/dL (ref 0.44–1.00)
GFR calc Af Amer: >60 mL/min (ref >60)
GFR calc non Af Amer: >60 mL/min (ref >60)
Glucose, Bld: 118 mg/dL — ABNORMAL HIGH (ref 65–99)
Potassium: 4.2 mmol/L (ref 3.5–5.1)
Sodium: 139 mmol/L (ref 135–145)

## 2016-08-25 LAB — CBC
HCT: 40.4 % (ref 36.0–46.0)
Hemoglobin: 13.4 g/dL (ref 12.0–15.0)
MCH: 30.4 pg (ref 26.0–34.0)
MCHC: 33.2 g/dL (ref 30.0–36.0)
MCV: 91.6 fL (ref 78.0–100.0)
Platelets: 149 10*3/uL — ABNORMAL LOW (ref 150–400)
RBC: 4.41 MIL/uL (ref 3.87–5.11)
RDW: 13.5 % (ref 11.5–15.5)
WBC: 4.8 10*3/uL (ref 4.0–10.5)

## 2016-08-25 LAB — AUTOLOGOUS SERUM PATCH PREP

## 2016-08-25 SURGERY — 25 GAUGE PARS PLANA VITRECTOMY WITH 20 GAUGE MVR PORT FOR MACULAR HOLE
Anesthesia: General | Site: Eye | Laterality: Right

## 2016-08-25 MED ORDER — SODIUM HYALURONATE 10 MG/ML IO SOLN
INTRAOCULAR | Status: AC
  Filled 2016-08-25: qty 0.85

## 2016-08-25 MED ORDER — SERTRALINE HCL 50 MG PO TABS: 25.0000 mg | ORAL_TABLET | Freq: Every day | ORAL | Status: DC

## 2016-08-25 MED ORDER — METOPROLOL TARTRATE 25 MG PO TABS
25.0000 mg | ORAL_TABLET | Freq: Once | ORAL | Status: DC
  Filled 2016-08-25 (×3): qty 1

## 2016-08-25 MED ORDER — ONDANSETRON HCL 4 MG/2ML IJ SOLN: 4.0000 mg | Freq: Four times a day (QID) | INTRAMUSCULAR | Status: DC | PRN

## 2016-08-25 MED ORDER — TROPICAMIDE 1 % OP SOLN
1.0000 [drp] | OPHTHALMIC | Status: DC | PRN
  Filled 2016-08-25: qty 3

## 2016-08-25 MED ORDER — CYCLOPENTOLATE HCL 1 % OP SOLN
1.0000 [drp] | OPHTHALMIC | Status: DC | PRN
  Filled 2016-08-25: qty 2

## 2016-08-25 MED ORDER — HYDROCODONE-ACETAMINOPHEN 5-325 MG PO TABS: 1.0000 | ORAL_TABLET | ORAL | Status: DC | PRN

## 2016-08-25 MED ORDER — BSS IO SOLN
INTRAOCULAR | Status: AC
  Filled 2016-08-25: qty 15

## 2016-08-25 MED ORDER — DEXAMETHASONE SODIUM PHOSPHATE 10 MG/ML IJ SOLN
INTRAMUSCULAR | Status: AC
  Filled 2016-08-25: qty 1

## 2016-08-25 MED ORDER — SUGAMMADEX SODIUM 500 MG/5ML IV SOLN
INTRAVENOUS | Status: DC | PRN
  Administered 2016-08-25: 400 mg via INTRAVENOUS

## 2016-08-25 MED ORDER — MORPHINE SULFATE (PF) 2 MG/ML IV SOLN: 1.0000 mg | INTRAVENOUS | Status: DC | PRN

## 2016-08-25 MED ORDER — SODIUM CHLORIDE 0.9 % IV SOLN
INTRAVENOUS | Status: DC
  Administered 2016-08-25: 50 mL/h via INTRAVENOUS

## 2016-08-25 MED ORDER — BUPIVACAINE HCL (PF) 0.75 % IJ SOLN
INTRAMUSCULAR | Status: DC | PRN
  Administered 2016-08-25: 10 mL

## 2016-08-25 MED ORDER — ESTRADIOL 1 MG PO TABS: 1.0000 mg | ORAL_TABLET | Freq: Every day | ORAL | Status: DC

## 2016-08-25 MED ORDER — TROPICAMIDE 1 % OP SOLN
3.0000 [drp] | OPHTHALMIC | Status: AC | PRN
  Administered 2016-08-25 (×3): 1 [drp] via OPHTHALMIC

## 2016-08-25 MED ORDER — ACETAMINOPHEN 325 MG PO TABS: 325.0000 mg | ORAL_TABLET | ORAL | Status: DC | PRN

## 2016-08-25 MED ORDER — FLECAINIDE ACETATE 100 MG PO TABS: 100.0000 mg | ORAL_TABLET | Freq: Once | ORAL | Status: DC

## 2016-08-25 MED ORDER — FLECAINIDE ACETATE 100 MG PO TABS
100.0000 mg | ORAL_TABLET | Freq: Every day | ORAL | Status: DC | PRN
  Filled 2016-08-25: qty 1

## 2016-08-25 MED ORDER — BACITRACIN-POLYMYXIN B 500-10000 UNIT/GM OP OINT
1.0000 "application " | TOPICAL_OINTMENT | Freq: Three times a day (TID) | OPHTHALMIC | Status: DC
  Filled 2016-08-25 (×2): qty 3.5

## 2016-08-25 MED ORDER — GATIFLOXACIN 0.5 % OP SOLN
1.0000 [drp] | Freq: Four times a day (QID) | OPHTHALMIC | Status: DC
  Filled 2016-08-25 (×2): qty 2.5

## 2016-08-25 MED ORDER — TEMAZEPAM 15 MG PO CAPS: 15.0000 mg | ORAL_CAPSULE | Freq: Every evening | ORAL | Status: DC | PRN

## 2016-08-25 MED ORDER — TETRACAINE HCL 0.5 % OP SOLN
2.0000 [drp] | Freq: Once | OPHTHALMIC | Status: DC
  Filled 2016-08-25: qty 2

## 2016-08-25 MED ORDER — PREDNISOLONE ACETATE 1 % OP SUSP
1.0000 [drp] | Freq: Four times a day (QID) | OPHTHALMIC | Status: DC
  Filled 2016-08-25: qty 5

## 2016-08-25 MED ORDER — FENTANYL CITRATE (PF) 100 MCG/2ML IJ SOLN
INTRAMUSCULAR | Status: AC
  Filled 2016-08-25: qty 2

## 2016-08-25 MED ORDER — FAMOTIDINE 10 MG PO TABS
10.0000 mg | ORAL_TABLET | Freq: Two times a day (BID) | ORAL | Status: DC
  Administered 2016-08-25: 10 mg via ORAL
  Filled 2016-08-25: qty 1

## 2016-08-25 MED ORDER — SODIUM HYALURONATE 10 MG/ML IO SOLN
INTRAOCULAR | Status: DC | PRN
  Administered 2016-08-25: 0.85 mL via INTRAOCULAR

## 2016-08-25 MED ORDER — DORZOLAMIDE HCL 2 % OP SOLN
1.0000 [drp] | Freq: Three times a day (TID) | OPHTHALMIC | Status: DC
  Filled 2016-08-25: qty 10

## 2016-08-25 MED ORDER — TRIAMCINOLONE ACETONIDE 40 MG/ML IJ SUSP
INTRAMUSCULAR | Status: AC
  Filled 2016-08-25: qty 5

## 2016-08-25 MED ORDER — POLYMYXIN B SULFATE 500000 UNITS IJ SOLR
INTRAMUSCULAR | Status: AC
  Filled 2016-08-25: qty 500000

## 2016-08-25 MED ORDER — STERILE WATER FOR INJECTION IJ SOLN
INTRAMUSCULAR | Status: AC
  Filled 2016-08-25: qty 20

## 2016-08-25 MED ORDER — DIPHENHYDRAMINE HCL 50 MG/ML IJ SOLN
INTRAMUSCULAR | Status: DC | PRN
  Administered 2016-08-25: 25 mg via INTRAVENOUS

## 2016-08-25 MED ORDER — PHENYLEPHRINE HCL 2.5 % OP SOLN
3.0000 [drp] | OPHTHALMIC | Status: AC | PRN
  Administered 2016-08-25 (×3): 1 [drp] via OPHTHALMIC

## 2016-08-25 MED ORDER — EPHEDRINE SULFATE 50 MG/ML IJ SOLN
INTRAMUSCULAR | Status: DC | PRN
  Administered 2016-08-25 (×2): 5 mg via INTRAVENOUS

## 2016-08-25 MED ORDER — FENTANYL CITRATE (PF) 100 MCG/2ML IJ SOLN: 25.0000 ug | INTRAMUSCULAR | Status: DC | PRN

## 2016-08-25 MED ORDER — PRAVASTATIN SODIUM 10 MG PO TABS
10.0000 mg | ORAL_TABLET | Freq: Every day | ORAL | Status: DC
  Administered 2016-08-25: 10 mg via ORAL
  Filled 2016-08-25: qty 1

## 2016-08-25 MED ORDER — BSS PLUS IO SOLN
INTRAOCULAR | Status: AC
  Filled 2016-08-25: qty 500

## 2016-08-25 MED ORDER — BRIMONIDINE TARTRATE 0.2 % OP SOLN
1.0000 [drp] | Freq: Two times a day (BID) | OPHTHALMIC | Status: DC
  Filled 2016-08-25: qty 5

## 2016-08-25 MED ORDER — STERILE WATER FOR INJECTION IJ SOLN
INTRAMUSCULAR | Status: DC | PRN
  Administered 2016-08-25: 20 mL

## 2016-08-25 MED ORDER — DIPHENHYDRAMINE HCL 50 MG/ML IJ SOLN
INTRAMUSCULAR | Status: AC
  Filled 2016-08-25: qty 1

## 2016-08-25 MED ORDER — 0.9 % SODIUM CHLORIDE (POUR BTL) OPTIME
TOPICAL | Status: DC | PRN
  Administered 2016-08-25: 200 mL

## 2016-08-25 MED ORDER — BACITRACIN-POLYMYXIN B 500-10000 UNIT/GM OP OINT
TOPICAL_OINTMENT | OPHTHALMIC | Status: AC
  Filled 2016-08-25: qty 3.5

## 2016-08-25 MED ORDER — PHENYLEPHRINE HCL 2.5 % OP SOLN
1.0000 [drp] | OPHTHALMIC | Status: DC | PRN
  Filled 2016-08-25: qty 2

## 2016-08-25 MED ORDER — HYPROMELLOSE (GONIOSCOPIC) 2.5 % OP SOLN
OPHTHALMIC | Status: AC
  Filled 2016-08-25: qty 15

## 2016-08-25 MED ORDER — CYCLOPENTOLATE HCL 1 % OP SOLN
3.0000 [drp] | OPHTHALMIC | Status: AC | PRN
  Administered 2016-08-25 (×3): 1 [drp] via OPHTHALMIC

## 2016-08-25 MED ORDER — ATROPINE SULFATE 1 % OP SOLN
OPHTHALMIC | Status: AC
  Filled 2016-08-25: qty 5

## 2016-08-25 MED ORDER — BUPIVACAINE HCL (PF) 0.75 % IJ SOLN
INTRAMUSCULAR | Status: AC
  Filled 2016-08-25: qty 10

## 2016-08-25 MED ORDER — ACETAZOLAMIDE SODIUM 500 MG IJ SOLR
500.0000 mg | Freq: Once | INTRAMUSCULAR | Status: AC
  Administered 2016-08-26: 500 mg via INTRAVENOUS
  Filled 2016-08-25: qty 500

## 2016-08-25 MED ORDER — ROCURONIUM BROMIDE 100 MG/10ML IV SOLN
INTRAVENOUS | Status: DC | PRN
  Administered 2016-08-25: 10 mg via INTRAVENOUS
  Administered 2016-08-25: 20 mg via INTRAVENOUS
  Administered 2016-08-25: 50 mg via INTRAVENOUS

## 2016-08-25 MED ORDER — PROPOFOL 10 MG/ML IV BOLUS
INTRAVENOUS | Status: DC | PRN
  Administered 2016-08-25: 130 mg via INTRAVENOUS

## 2016-08-25 MED ORDER — ONDANSETRON HCL 4 MG/2ML IJ SOLN
INTRAMUSCULAR | Status: DC | PRN
  Administered 2016-08-25: 4 mg via INTRAVENOUS

## 2016-08-25 MED ORDER — DEXAMETHASONE SODIUM PHOSPHATE 10 MG/ML IJ SOLN
INTRAMUSCULAR | Status: DC | PRN
  Administered 2016-08-25: 10 mg via INTRAVENOUS

## 2016-08-25 MED ORDER — BACITRACIN-POLYMYXIN B 500-10000 UNIT/GM OP OINT
TOPICAL_OINTMENT | OPHTHALMIC | Status: DC | PRN
  Administered 2016-08-25: 1 via OPHTHALMIC

## 2016-08-25 MED ORDER — SODIUM CHLORIDE 0.9 % IJ SOLN
INTRAMUSCULAR | Status: AC
  Filled 2016-08-25: qty 10

## 2016-08-25 MED ORDER — MIDAZOLAM HCL 2 MG/2ML IJ SOLN
INTRAMUSCULAR | Status: AC
  Filled 2016-08-25: qty 2

## 2016-08-25 MED ORDER — METOPROLOL TARTRATE 12.5 MG HALF TABLET
12.5000 mg | ORAL_TABLET | Freq: Two times a day (BID) | ORAL | Status: DC
  Administered 2016-08-25: 12.5 mg via ORAL
  Filled 2016-08-25: qty 1

## 2016-08-25 MED ORDER — GATIFLOXACIN 0.5 % OP SOLN
1.0000 [drp] | OPHTHALMIC | Status: DC | PRN
  Filled 2016-08-25: qty 2.5

## 2016-08-25 MED ORDER — CEFTAZIDIME 1 G IJ SOLR
INTRAMUSCULAR | Status: AC
  Filled 2016-08-25: qty 1

## 2016-08-25 MED ORDER — EPINEPHRINE HCL 1 MG/ML IJ SOLN
INTRAMUSCULAR | Status: AC
  Filled 2016-08-25: qty 1

## 2016-08-25 MED ORDER — PROPOFOL 10 MG/ML IV BOLUS
INTRAVENOUS | Status: AC
  Filled 2016-08-25: qty 20

## 2016-08-25 MED ORDER — ONDANSETRON HCL 4 MG/2ML IJ SOLN
INTRAMUSCULAR | Status: AC
  Filled 2016-08-25: qty 2

## 2016-08-25 MED ORDER — MIDAZOLAM HCL 5 MG/5ML IJ SOLN
INTRAMUSCULAR | Status: DC | PRN
  Administered 2016-08-25: 2 mg via INTRAVENOUS

## 2016-08-25 MED ORDER — STERILE WATER FOR IRRIGATION IR SOLN
Status: DC | PRN
  Administered 2016-08-25: 200 mL

## 2016-08-25 MED ORDER — SODIUM CHLORIDE 0.45 % IV SOLN
INTRAVENOUS | Status: DC
  Administered 2016-08-25: 15:00:00 via INTRAVENOUS

## 2016-08-25 MED ORDER — LATANOPROST 0.005 % OP SOLN
1.0000 [drp] | Freq: Every day | OPHTHALMIC | Status: DC
  Filled 2016-08-25: qty 2.5

## 2016-08-25 MED ORDER — LIDOCAINE HCL (CARDIAC) 20 MG/ML IV SOLN
INTRAVENOUS | Status: DC | PRN
  Administered 2016-08-25: 60 mg via INTRATRACHEAL
  Administered 2016-08-25: 100 mg via INTRAVENOUS

## 2016-08-25 MED ORDER — MAGNESIUM HYDROXIDE 400 MG/5ML PO SUSP: 15.0000 mL | Freq: Four times a day (QID) | ORAL | Status: DC | PRN

## 2016-08-25 MED ORDER — GATIFLOXACIN 0.5 % OP SOLN
3.0000 [drp] | OPHTHALMIC | Status: AC | PRN
  Administered 2016-08-25 (×3): 1 [drp] via OPHTHALMIC

## 2016-08-25 MED ORDER — HEMOSTATIC AGENTS (NO CHARGE) OPTIME
TOPICAL | Status: DC | PRN
  Administered 2016-08-25: 1 via TOPICAL

## 2016-08-25 MED ORDER — CLINDAMYCIN PHOSPHATE 600 MG/50ML IV SOLN
600.0000 mg | INTRAVENOUS | Status: DC | PRN
  Administered 2016-08-25: 600 mg via INTRAVENOUS
  Filled 2016-08-25: qty 50

## 2016-08-25 MED ORDER — BSS PLUS IO SOLN
INTRAOCULAR | Status: DC | PRN
  Administered 2016-08-25: 11:00:00

## 2016-08-25 MED ORDER — FENTANYL CITRATE (PF) 100 MCG/2ML IJ SOLN
INTRAMUSCULAR | Status: DC | PRN
  Administered 2016-08-25: 100 ug via INTRAVENOUS

## 2016-08-25 MED ORDER — DEXAMETHASONE SODIUM PHOSPHATE 10 MG/ML IJ SOLN
INTRAMUSCULAR | Status: DC | PRN
  Administered 2016-08-25: 10 mg

## 2016-08-25 SURGICAL SUPPLY — 70 items
BALL CTTN LRG ABS STRL LF (GAUZE/BANDAGES/DRESSINGS) ×3
BLADE EYE CATARACT 19 1.4 BEAV (BLADE) IMPLANT
BLADE MVR KNIFE 19G (BLADE) IMPLANT
BLADE MVR KNIFE 20G (BLADE) ×2 IMPLANT
CANNULA VLV SOFT TIP 25G (OPHTHALMIC) ×1 IMPLANT
CANNULA VLV SOFT TIP 25GA (OPHTHALMIC) ×4 IMPLANT
CORDS BIPOLAR (ELECTRODE) ×2 IMPLANT
COTTONBALL LRG STERILE PKG (GAUZE/BANDAGES/DRESSINGS) ×6 IMPLANT
COVER MAYO STAND STRL (DRAPES) IMPLANT
DRAPE INCISE 51X51 W/FILM STRL (DRAPES) IMPLANT
DRAPE OPHTHALMIC 77X100 STRL (CUSTOM PROCEDURE TRAY) ×2 IMPLANT
EAGLE VIT/RET MICRO PIC 168 25 (MISCELLANEOUS) ×1 IMPLANT
ERASER HMR WETFIELD 23G BP (MISCELLANEOUS) ×2 IMPLANT
FILTER BLUE MILLIPORE (MISCELLANEOUS) IMPLANT
FILTER STRAW FLUID ASPIR (MISCELLANEOUS) ×2 IMPLANT
FORCEPS GRIESHABER ILM 25G A (INSTRUMENTS) ×1 IMPLANT
GAS AUTO FILL CONSTEL (OPHTHALMIC) ×2
GAS AUTO FILL CONSTELLATION (OPHTHALMIC) ×1 IMPLANT
GLOVE SS BIOGEL STRL SZ 6.5 (GLOVE) ×1 IMPLANT
GLOVE SS BIOGEL STRL SZ 7 (GLOVE) ×1 IMPLANT
GLOVE SUPERSENSE BIOGEL SZ 6.5 (GLOVE) ×1
GLOVE SUPERSENSE BIOGEL SZ 7 (GLOVE) ×1
GLOVE SURG 8.5 LATEX PF (GLOVE) ×2 IMPLANT
GLOVE SURG SS PI 6.5 STRL IVOR (GLOVE) ×2 IMPLANT
GOWN STRL REUS W/ TWL LRG LVL3 (GOWN DISPOSABLE) ×3 IMPLANT
GOWN STRL REUS W/TWL LRG LVL3 (GOWN DISPOSABLE) ×6
HANDLE PNEUMATIC FOR CONSTEL (OPHTHALMIC) ×1 IMPLANT
KIT BASIN OR (CUSTOM PROCEDURE TRAY) ×2 IMPLANT
KNIFE GRIESHABER SHARP 2.5MM (MISCELLANEOUS) IMPLANT
MICROPICK 25G (MISCELLANEOUS) ×2
NDL 18GX1X1/2 (RX/OR ONLY) (NEEDLE) ×1 IMPLANT
NDL 25GX 5/8IN NON SAFETY (NEEDLE) ×1 IMPLANT
NDL FILTER BLUNT 18X1 1/2 (NEEDLE) IMPLANT
NDL HYPO 30X.5 LL (NEEDLE) IMPLANT
NEEDLE 18GX1X1/2 (RX/OR ONLY) (NEEDLE) ×2 IMPLANT
NEEDLE 25GX 5/8IN NON SAFETY (NEEDLE) ×2 IMPLANT
NEEDLE FILTER BLUNT 18X 1/2SAF (NEEDLE) ×1
NEEDLE FILTER BLUNT 18X1 1/2 (NEEDLE) ×1 IMPLANT
NEEDLE HYPO 30X.5 LL (NEEDLE) IMPLANT
NS IRRIG 1000ML POUR BTL (IV SOLUTION) ×2 IMPLANT
PACK FRAGMATOME (OPHTHALMIC) IMPLANT
PACK VITRECTOMY CUSTOM (CUSTOM PROCEDURE TRAY) ×2 IMPLANT
PAD ARMBOARD 7.5X6 YLW CONV (MISCELLANEOUS) ×3 IMPLANT
PAK PIK VITRECTOMY CVS 25GA (OPHTHALMIC) ×2 IMPLANT
PIC ILLUMINATED 25G (OPHTHALMIC) ×2
PICK MICROPICK 25G (MISCELLANEOUS) IMPLANT
PIK ILLUMINATED 25G (OPHTHALMIC) ×1 IMPLANT
PROBE LASER ILLUM FLEX CVD 25G (OPHTHALMIC) IMPLANT
REPL STRA BRUSH NDL (NEEDLE) ×1 IMPLANT
REPL STRA BRUSH NEEDLE (NEEDLE) ×2 IMPLANT
RESERVOIR BACK FLUSH (MISCELLANEOUS) ×2 IMPLANT
ROLLS DENTAL (MISCELLANEOUS) ×4 IMPLANT
SCRAPER DIAMOND 25GA (OPHTHALMIC RELATED) IMPLANT
SCRAPER DIAMOND DUST MEMBRANE (MISCELLANEOUS) ×3 IMPLANT
SPONGE SURGIFOAM ABS GEL 12-7 (HEMOSTASIS) ×2 IMPLANT
STOPCOCK 4 WAY LG BORE MALE ST (IV SETS) IMPLANT
SUT CHROMIC 7 0 TG140 8 (SUTURE) IMPLANT
SUT ETHILON 10 0 CS140 6 (SUTURE) ×1 IMPLANT
SUT ETHILON 9 0 TG140 8 (SUTURE) ×2 IMPLANT
SUT POLY NON ABSORB 10-0 8 STR (SUTURE) IMPLANT
SUT SILK 4 0 RB 1 (SUTURE) IMPLANT
SUT VICRYL 7 0 TG140 8 (SUTURE) ×1 IMPLANT
SYR 20CC LL (SYRINGE) ×2 IMPLANT
SYR 5ML LL (SYRINGE) IMPLANT
SYR BULB 3OZ (MISCELLANEOUS) ×2 IMPLANT
SYR TB 1ML LUER SLIP (SYRINGE) ×2 IMPLANT
SYRINGE 10CC LL (SYRINGE) ×1 IMPLANT
TUBING HIGH PRESS EXTEN 6IN (TUBING) IMPLANT
WATER STERILE IRR 1000ML POUR (IV SOLUTION) ×2 IMPLANT
WIPE INSTRUMENT VISIWIPE 73X73 (MISCELLANEOUS) IMPLANT

## 2016-08-25 NOTE — Brief Op Note (Signed)
   Daisy Nelson 08/25/2016, 1:38 PM Brief Operative note   Preoperative diagnosis:  Preretinal fibrosis right eye Postoperative diagnosis  *hyphema right eye  Procedures: Pars plana vitrectomy, laser, gas injection, AC wash out. Membrane peel right eye  Surgeon:  Daisy Pedro, MD...  Assistant:  Deatra Ina SA    Anesthesia: General  Specimen: none  Estimated blood loss:  1cc  Complications: none  Patient sent to PACU in good condition  Composed by Daisy Pedro MD  Dictation number: [phone]

## 2016-08-25 NOTE — Transfer of Care (Signed)
Immediate Anesthesia Transfer of Care Note  Patient: RUBIA COLCLOUGH  Procedure(s) Performed: Procedure(s): 25 GAUGE PARS PLANA VITRECTOMY; RIGHT EYE (Right) MEMBRANE PEEL RIGHT EYE (Right) GAS/FLUID EXCHANGE RIGHT EYE (Right) PERIPHERAL IRIDECTOMY RIGHT EYE (Right)  Patient Location: PACU  Anesthesia Type:General  Level of Consciousness: sedated  Airway & Oxygen Therapy: Patient Spontanous Breathing and Patient connected to nasal cannula oxygen  Post-op Assessment: Report given to RN and Post -op Vital signs reviewed and stable  Post vital signs: Reviewed and stable  Last Vitals:  Vitals:   08/25/16 0901 08/25/16 1347  BP: (!) 140/49 (!) (P) 129/54  Pulse: (!) 47   Resp: 18   Temp: 36.9 C (P) 36.6 C    Last Pain:  Vitals:   08/25/16 0901  TempSrc: Oral         Complications: No apparent anesthesia complications

## 2016-08-25 NOTE — Progress Notes (Signed)
Report given to ly rn as caregiver 

## 2016-08-25 NOTE — Anesthesia Procedure Notes (Signed)
Procedure Name: Intubation Date/Time: 08/25/2016 11:40 AM Performed by: [place] Pink Pre-anesthesia Checklist: Patient identified, Emergency Drugs available, Suction available, Patient being monitored and Timeout performed Patient Re-evaluated:Patient Re-evaluated prior to inductionOxygen Delivery Method: Circle system utilized Preoxygenation: Pre-oxygenation with 100% oxygen Intubation Type: IV induction Ventilation: Mask ventilation without difficulty Laryngoscope Size: Mac and 4 Grade View: Grade II Tube type: Oral Tube size: 7.0 mm Number of attempts: 1 Airway Equipment and Method: Stylet and LTA kit utilized Placement Confirmation: ETT inserted through vocal cords under direct vision,  positive ETCO2 and breath sounds checked- equal and bilateral Secured at: 21 cm Tube secured with: Tape Dental Injury: Teeth and Oropharynx as per pre-operative assessment

## 2016-08-25 NOTE — H&P (Signed)
I examined the patient today and there is no change in the medical status 

## 2016-08-25 NOTE — Anesthesia Preprocedure Evaluation (Signed)
Anesthesia Evaluation  Patient identified by MRN, date of birth, ID band Patient awake    Reviewed: Allergy & Precautions, H&P , Patient's Chart, lab work & pertinent test results, reviewed documented beta blocker date and time   History of Anesthesia Complications (+) PROLONGED EMERGENCE and history of anesthetic complications  Airway Mallampati: II  TM Distance: >3 FB Neck ROM: full    Dental no notable dental hx.    Pulmonary former smoker,    Pulmonary exam normal breath sounds clear to auscultation       Cardiovascular Atrial Fibrillation  Rhythm:regular Rate:Normal     Neuro/Psych    GI/Hepatic GERD  ,  Endo/Other    Renal/GU      Musculoskeletal   Abdominal   Peds  Hematology   Anesthesia Other Findings   Reproductive/Obstetrics                             Anesthesia Physical Anesthesia Plan  ASA: II  Anesthesia Plan: General   Post-op Pain Management:    Induction: Intravenous  Airway Management Planned: Oral ETT  Additional Equipment:   Intra-op Plan:   Post-operative Plan: Extubation in OR  Informed Consent: I have reviewed the patients History and Physical, chart, labs and discussed the procedure including the risks, benefits and alternatives for the proposed anesthesia with the patient or authorized representative who has indicated his/her understanding and acceptance.   Dental Advisory Given and Dental advisory given  Plan Discussed with: CRNA and Surgeon  Anesthesia Plan Comments: (  Discussed general anesthesia, including possible nausea, instrumentation of airway, sore throat,pulmonary aspiration, etc. I asked if the were any outstanding questions, or  concerns before we proceeded. )        Anesthesia Quick Evaluation

## 2016-08-26 ENCOUNTER — Encounter (HOSPITAL_COMMUNITY): Payer: Self-pay | Admitting: Ophthalmology

## 2016-08-26 DIAGNOSIS — H35341 Macular cyst, hole, or pseudohole, right eye: Secondary | ICD-10-CM | POA: Diagnosis not present

## 2016-08-26 MED ORDER — BACITRACIN-POLYMYXIN B 500-10000 UNIT/GM OP OINT: 1.0000 "application " | TOPICAL_OINTMENT | Freq: Three times a day (TID) | OPHTHALMIC | 0 refills | Status: DC

## 2016-08-26 MED ORDER — PREDNISOLONE ACETATE 1 % OP SUSP: 1.0000 [drp] | Freq: Four times a day (QID) | OPHTHALMIC | 0 refills | Status: DC

## 2016-08-26 MED ORDER — GATIFLOXACIN 0.5 % OP SOLN: 1.0000 [drp] | Freq: Four times a day (QID) | OPHTHALMIC | Status: DC

## 2016-08-26 NOTE — Progress Notes (Signed)
08/26/2016, 6:33 AM  Mental Status:  Awake, Alert, Oriented    " I can see the clock across the room" Seen at bedside with slit lamp.  Anterior segment: Cornea  Clear    Anterior Chamber Clear    Lens:    IOL   Peripheral iridectomy at 2:00  Intra Ocular Pressure 15 mmHg with Tonopen  Vitreous: Clear 10%gas bubble   Retina:  Attached Good laser reaction   Impression: Excellent result Retina attached   Final Diagnosis: Active Problems:   Preretinal fibrosis, right eye   Plan: start post operative eye drops.  Discharge to home.  Give post operative instructions  Daisy Nelson 08/26/2016, 6:33 AM

## 2016-08-26 NOTE — Discharge Summary (Signed)
Discharge summary not needed on [facility] patients per medical records. 

## 2016-08-26 NOTE — Op Note (Signed)
NAMEARLY, BLEE             ACCOUNT NO.:  [id number]  MEDICAL RECORD NO.:  [id number]  LOCATION:  [id number]                        FACILITY:  [facility]  PHYSICIAN:  Chrystie Nose. Zigmund Daniel, M.D. DATE OF BIRTH:  [birth year]  DATE OF PROCEDURE:  08/25/2016 DATE OF DISCHARGE:                              OPERATIVE REPORT   ADMISSION DIAGNOSIS:  Preretinal fibrosis in the right eye.  PROCEDURES:  Pars plana vitrectomy, retinal photocoagulation, gas fluid exchange, membrane peel, peripheral iridectomy in the right eye.  SURGEON:  Chrystie Nose. Zigmund Daniel, M.D.  ASSISTANT:  Deatra Ina, SA.  ANESTHESIA:  General.  DESCRIPTION OF PROCEDURE:  Usual prep and drape, the indirect ophthalmoscope laser was moved into place.  801 burns were placed around the retinal periphery in weak the areas of retina, the power was 400 mW, 1000 microns each, and 0.1 seconds each.  Attention was carried to the pars plana area.  A 3-layered MVR incision was made at 2 o'clock.  25- gauge trocars were placed at the 8 o'clock and 10 o'clock.  Contact lens ring was anchored into place, Provisc was placed on the corneal surface. At this time, it was apparent that a hyphema coated the anterior surface of the intra-ocular lens.  There was no view of the hemorrhage.  The records were checked and the patient was supposed to have stopped her Xarelto for 5 days and represented that she had done so.  The pars plana vitrectomy could not be begun until the view was improved.  25-gauge trocar was placed through the cornea at 9 o'clock into the anterior chamber.  Provisc was placed in the anterior chamber.  A peripheral iridectomy was created at 2 o'clock and then the vitrector was used to cut through the peripheral capsule at 2 o'clock so as to make single chamber between the anterior chamber and the vitreous cavity.  This worked nicely and the hyphema began to clear.  There was clot and it was removed with the 25-gauge cutter through  the corneal trocar.  The trocar remained in place throughout the case.  Of course infusion had been placed at 8 o'clock.  The pars plana vitrectomy was begun just behind the crystalline lens.  The view was then clear.  The vitrectomy was carried posteriorly and any vitreous strands were removed under low suction and rapid cutting.  Now the view of the macula came into play. There was no vitreous hemorrhage seen.  The diamond-dusted membrane scraper was used to engage membranes around the fovea.  The 30-degree prismatic lens was used for peripheral viewing.  The pars plana vitrectomy was moved into the mid periphery and then the far periphery. All vitreous was trimmed for 360 degrees.  Methylcellulose was placed on the cornea again and a magnifying contact lens was placed.  Attention was carried to the macular region.  The membranes were engaged with the diamond-dusted membrane scraper.  A 25-gauge pick was used in an attempt to engage deeper membrane; however, small hemorrhage occurred as soon as the tip touched the surface of the retina.  The hemorrhage was limited and no cautery was needed.  The intra-ocular pressure was 30 at that point, and no extension of the  hemorrhage occurred.  The intra-ocular pressure was then reduced down to 20 again and the procedure proceeded. Once the membrane was freed from its attachments around the macula, a 30- degree gas fluid exchange was performed.  The instruments were removed from the eye.  The 25-gauge trocars were removed from the pars plana and from the cornea.  The 20-gauge scleral wound was closed with 9-0 interrupted nylon 1 suture.  The conjunctiva was reposited with 7-0 chromic.  Polymyxin and ceftazidime were rinsed around the globe for postop antibiotic coverage.  Decadron 10 mg was injected to the lower subconjunctival space.  Hemorrhage occurred at that point when the needle went through the conjunctiva.  Marcaine was injected around the  globe for postop pain.  Closing pressure was 10 with a Pharmacologist.  Polysporin ophthalmic ointment, patch, and shield were placed.  The patient was awakened and taken to the recovery room in a satisfactory condition.     Chrystie Nose. Zigmund Daniel, M.D.     JDM/MEDQ  D:  08/25/2016  T:  08/26/2016  Job:  [id number]

## 2016-08-26 NOTE — Progress Notes (Signed)
Discharge instructions reviewed with pt and pt had eye bag with medications and supplies.  Pt verbalized understanding and had no questions.  Pt discharged in stable condition via wheelchair with husband.  Eliezer [place]

## 2016-08-28 NOTE — Anesthesia Postprocedure Evaluation (Signed)
Anesthesia Post Note  Patient: Daisy Nelson  Procedure(s) Performed: Procedure(s) (LRB): 25 GAUGE PARS PLANA VITRECTOMY; RIGHT EYE (Right) MEMBRANE PEEL RIGHT EYE (Right) GAS/FLUID EXCHANGE RIGHT EYE (Right) PERIPHERAL IRIDECTOMY RIGHT EYE (Right)  Patient location during evaluation: PACU Anesthesia Type: General Level of consciousness: sedated Pain management: satisfactory to patient Vital Signs Assessment: post-procedure vital signs reviewed and stable Respiratory status: spontaneous breathing Cardiovascular status: stable Anesthetic complications: no     Last Vitals:  Vitals:   08/25/16 2108 08/26/16 0401  BP: (!) 140/43 (!) 143/45  Pulse: 67 66  Resp: 17 17  Temp: 36.3 C 36.4 C    Last Pain:  Vitals:   08/26/16 0401  TempSrc: Oral  PainSc:    Pain Goal:                 Riccardo Dubin

## 2016-09-01 ENCOUNTER — Encounter (INDEPENDENT_AMBULATORY_CARE_PROVIDER_SITE_OTHER): Payer: PPO | Admitting: Ophthalmology

## 2016-09-01 DIAGNOSIS — H35371 Puckering of macula, right eye: Secondary | ICD-10-CM

## 2016-09-03 ENCOUNTER — Ambulatory Visit (INDEPENDENT_AMBULATORY_CARE_PROVIDER_SITE_OTHER): Payer: PPO | Admitting: Physician Assistant

## 2016-09-03 VITALS — BP 118/66 | HR 55 | Temp 98.2°F | Resp 16 | Wt 163.2 lb

## 2016-09-03 DIAGNOSIS — R300 Dysuria: Secondary | ICD-10-CM | POA: Diagnosis not present

## 2016-09-03 LAB — POCT URINALYSIS DIPSTICK
Bilirubin, UA: NEGATIVE
Glucose, UA: 100
Ketones, UA: NEGATIVE
Nitrite, UA: POSITIVE
Protein, UA: 30
Spec Grav, UA: 1.015
Urobilinogen, UA: 0.2
pH, UA: 5.5

## 2016-09-03 LAB — POCT UA - MICROSCOPIC ONLY: Mucus, UA: ABSENT

## 2016-09-03 MED ORDER — SULFAMETHOXAZOLE-TRIMETHOPRIM 800-160 MG PO TABS: 1.0000 | ORAL_TABLET | Freq: Two times a day (BID) | ORAL | 0 refills | Status: AC

## 2016-09-03 NOTE — Progress Notes (Signed)
Patient ID: Daisy Nelson, female    DOB: 07/30/1946, 70 y.o.   MRN: [id number]  PCP: Harrison Mons, PA-C  Subjective:   Chief Complaint  Patient presents with  . Urinary Tract Infection    dysuria and pressure x 2 days tried uristat    HPI Presents for evaluation of 2 days of urinary symptoms.  She describes urinary urgency, with some incontinence, frequency and dysuria. OTC Azo with temporary relief. No hematuria. No vaginal symptoms. Last symptoms like this 25-30 years ago. No fever, chills, low back or abdominal pain.    Review of Systems As above.    Patient Active Problem List   Diagnosis Date Noted  . Preretinal fibrosis, right eye 08/25/2016  . Hyperglycemia 03/26/2015  . PAF (paroxysmal atrial fibrillation) ([place]) 07/10/2013  . Atrial fibrillation with rapid ventricular response ([place]) 02/06/2013  . Family history of coronary artery bypass surgery 02/06/2013  . GERD (gastroesophageal reflux disease) 10/25/2012  . Allergy 10/25/2012  . Hyperlipidemia 10/25/2012  . Depression 10/25/2012  . Anxiety 10/25/2012  . MVP (mitral valve prolapse) 10/25/2012  . DJD (degenerative joint disease) of cervical spine 10/25/2012  . Nephrolithiasis 10/25/2012  . Colon polyps 10/25/2012     Prior to Admission medications   Medication Sig Start Date End Date Taking? Authorizing Provider  bacitracin-polymyxin b (POLYSPORIN) ophthalmic ointment Place 1 application into the right eye 3 (three) times daily. apply to eye every 12 hours while awake 08/26/16  Yes [name], MD  estradiol (ESTRACE) 1 MG tablet TAKE ONE TABLET BY MOUTH ONCE DIALY Patient taking differently: Take 1 mg by mouth daily.  12/14/15  Yes Daria Mcmeekin, PA-C  flecainide (TAMBOCOR) 100 MG tablet Take 2 tablets by mouth with onset of atrial fib. No more than once a week Patient taking differently: Take 2 tablets by mouth with onset of atrial fib. No more than once a week 09/03/15  Yes Sherran Needs, NP    fluticasone [facility]) 50 MCG/ACT nasal spray Place 2 sprays into both nostrils daily. 03/24/16  Yes Hayat Warbington, PA-C  gatifloxacin (ZYMAXID) 0.5 % SOLN Place 1 drop into the right eye 4 (four) times daily. 08/26/16  Yes [name], MD  ibuprofen (ADVIL,MOTRIN) 200 MG tablet Take 400 mg by mouth every 6 (six) hours as needed for headache.   Yes Historical Provider, MD  metoprolol tartrate (LOPRESSOR) 25 MG tablet TAKE 1/2 TABLET BY MOUTH TWICE DAILY Patient taking differently: Take 12.5 mg by mouth 2 (two) times daily.  10/08/15  Yes Sherran Needs, NP  pravastatin (PRAVACHOL) 40 MG tablet TAKE ONE (1) TABLET BY MOUTH EVERY DAY 08/18/16  Yes Shantika Bermea, PA-C  prednisoLONE acetate (PRED FORTE) 1 % ophthalmic suspension Place 1 drop into the right eye 4 (four) times daily. 08/26/16  Yes [name], MD  ranitidine (ZANTAC) 150 MG tablet Take 150 mg by mouth every evening.    Yes Historical Provider, MD  rivaroxaban (XARELTO) 20 MG TABS tablet Take 1 tablet (20 mg total) by mouth daily with supper. Patient taking differently: Take 20 mg by mouth daily with supper. WILL STOP PRIOR TO PROCEDURE 08/31/15  Yes Isaiah Serge, NP  sertraline (ZOLOFT) 25 MG tablet Take 1 tablet (25 mg total) by mouth daily. 10/25/15  Yes Harrison Mons, PA-C     Allergies  Allergen Reactions  . Adhesive [Tape]     Burn skin  . Erythromycin     Gi intolerance  . Naproxen Nausea And  Vomiting    Abdominal pain   . Penicillins Hives    Has patient had a PCN reaction causing immediate rash, facial/tongue/throat swelling, SOB or lightheadedness with hypotension: UNKNOWN Has patient had a PCN reaction causing severe rash involving mucus membranes or skin necrosis: No Has patient had a PCN reaction that required hospitalization No Has patient had a PCN reaction occurring within the last 10 years: No If all of the above answers are "NO", then may proceed with Cephalosporin use.        Objective:  Physical  Exam  Constitutional: She is oriented to person, place, and time. She appears well-developed and well-nourished. She is active and cooperative. No distress.  BP 118/66 (BP Location: Right Arm, Cuff Size: Normal)   Pulse (!) 55   Temp 98.2 F (36.8 C) (Oral)   Resp 16   Wt 163 lb 3.2 oz (74 kg)   SpO2 96%   BMI 28.01 kg/m   HENT:  Head: Normocephalic and atraumatic.  Right Ear: Hearing normal.  Left Ear: Hearing normal.  Eyes: Conjunctivae are normal. No scleral icterus.  Neck: Normal range of motion. Neck supple. No thyromegaly present.  Cardiovascular: Normal rate, regular rhythm and normal heart sounds.   Pulses:      Radial pulses are 2+ on the right side, and 2+ on the left side.  Pulmonary/Chest: Effort normal and breath sounds normal.  Abdominal: Normal appearance and bowel sounds are normal. There is tenderness in the suprapubic area. There is no CVA tenderness.  Lymphadenopathy:       Head (right side): No tonsillar, no preauricular, no posterior auricular and no occipital adenopathy present.       Head (left side): No tonsillar, no preauricular, no posterior auricular and no occipital adenopathy present.    She has no cervical adenopathy.       Right: No supraclavicular adenopathy present.       Left: No supraclavicular adenopathy present.  Neurological: She is alert and oriented to person, place, and time. No sensory deficit.  Skin: Skin is warm, dry and intact. No rash noted. No cyanosis or erythema. Nails show no clubbing.  Psychiatric: She has a normal mood and affect. Her speech is normal and behavior is normal.       Results for orders placed or performed in visit on 09/03/16  POCT UA - Microscopic Only  Result Value Ref Range   WBC, Ur, HPF, POC many    RBC, urine, microscopic many    Bacteria, U Microscopic many    Mucus, UA absent    Epithelial cells, urine per micros none    Crystals, Ur, HPF, POC none    Casts, Ur, LPF, POC none    Yeast, UA none     Urinalysis Dipstick  Result Value Ref Range   Color, UA yellow    Clarity, UA cloudy    Glucose, UA 100    Bilirubin, UA neg    Ketones, UA neg    Spec Grav, UA 1.015    Blood, UA large    pH, UA 5.5    Protein, UA 30    Urobilinogen, UA 0.2    Nitrite, UA pos    Leukocytes, UA small (1+) (A) Negative       Assessment & Plan:   1. Dysuria Treat empirically for UTI, Await UCx. Supportive care. Anticipatory guidance. - POCT UA - Microscopic Only - Urinalysis Dipstick - Urine culture - sulfamethoxazole-trimethoprim (BACTRIM DS,SEPTRA DS) 800-160 MG tablet;  Take 1 tablet by mouth 2 (two) times daily.  Dispense: 10 tablet; Refill: 0   Fara Chute, PA-C Physician Assistant-Certified [facility]

## 2016-09-03 NOTE — Patient Instructions (Signed)
     IF you received an x-ray today, you will receive an invoice from [facility]. Please contact [facility] at [phone] with questions or concerns regarding your invoice.   IF you received labwork today, you will receive an invoice from [facility]. Please contact [facility] at [phone] with questions or concerns regarding your invoice.   Our billing staff will not be able to assist you with questions regarding bills from these companies.  You will be contacted with the lab results as soon as they are available. The fastest way to get your results is to activate your My Chart account. Instructions are located on the last page of this paperwork. If you have not heard from us regarding the results in 2 weeks, please contact this office.      

## 2016-09-05 LAB — URINE CULTURE

## 2016-09-07 ENCOUNTER — Other Ambulatory Visit: Payer: Self-pay | Admitting: Cardiology

## 2016-09-21 ENCOUNTER — Encounter (INDEPENDENT_AMBULATORY_CARE_PROVIDER_SITE_OTHER): Payer: PPO | Admitting: Ophthalmology

## 2016-09-21 DIAGNOSIS — H35371 Puckering of macula, right eye: Secondary | ICD-10-CM

## 2016-09-22 ENCOUNTER — Encounter: Payer: Self-pay | Admitting: Physician Assistant

## 2016-09-22 ENCOUNTER — Ambulatory Visit (INDEPENDENT_AMBULATORY_CARE_PROVIDER_SITE_OTHER): Payer: PPO | Admitting: Physician Assistant

## 2016-09-22 ENCOUNTER — Telehealth: Payer: Self-pay | Admitting: Internal Medicine

## 2016-09-22 VITALS — BP 120/70 | HR 56 | Temp 97.8°F | Resp 16 | Ht 64.5 in | Wt 163.0 lb

## 2016-09-22 DIAGNOSIS — F419 Anxiety disorder, unspecified: Secondary | ICD-10-CM | POA: Diagnosis not present

## 2016-09-22 DIAGNOSIS — T7840XD Allergy, unspecified, subsequent encounter: Secondary | ICD-10-CM | POA: Diagnosis not present

## 2016-09-22 DIAGNOSIS — Z23 Encounter for immunization: Secondary | ICD-10-CM

## 2016-09-22 DIAGNOSIS — F329 Major depressive disorder, single episode, unspecified: Secondary | ICD-10-CM | POA: Diagnosis not present

## 2016-09-22 DIAGNOSIS — R739 Hyperglycemia, unspecified: Secondary | ICD-10-CM

## 2016-09-22 DIAGNOSIS — E785 Hyperlipidemia, unspecified: Secondary | ICD-10-CM | POA: Diagnosis not present

## 2016-09-22 DIAGNOSIS — F32A Depression, unspecified: Secondary | ICD-10-CM

## 2016-09-22 LAB — LIPID PANEL
Cholesterol: 201 mg/dL — ABNORMAL HIGH (ref 125–200)
HDL: 58 mg/dL (ref >=46)
LDL Cholesterol: 109 mg/dL (ref <130)
Total CHOL/HDL Ratio: 3.5 Ratio (ref <=5.0)
Triglycerides: 168 mg/dL — ABNORMAL HIGH (ref <150)
VLDL: 34 mg/dL — ABNORMAL HIGH (ref <30)

## 2016-09-22 LAB — COMPREHENSIVE METABOLIC PANEL
ALT: 16 U/L (ref 6–29)
AST: 17 U/L (ref 10–35)
Albumin: 3.9 g/dL (ref 3.6–5.1)
Alkaline Phosphatase: 70 U/L (ref 33–130)
BUN: 10 mg/dL (ref 7–25)
CO2: 26 mmol/L (ref 20–31)
Calcium: 9.1 mg/dL (ref 8.6–10.4)
Chloride: 102 mmol/L (ref 98–110)
Creat: 0.77 mg/dL (ref 0.50–0.99)
Glucose, Bld: 107 mg/dL — ABNORMAL HIGH (ref 65–99)
Potassium: 4.2 mmol/L (ref 3.5–5.3)
Sodium: 137 mmol/L (ref 135–146)
Total Bilirubin: 0.4 mg/dL (ref 0.2–1.2)
Total Protein: 6.4 g/dL (ref 6.1–8.1)

## 2016-09-22 MED ORDER — PRAVASTATIN SODIUM 40 MG PO TABS: ORAL_TABLET | ORAL | 3 refills | Status: DC

## 2016-09-22 NOTE — Progress Notes (Signed)
Subjective:    Patient ID: Daisy Nelson, female    DOB: November 05, 1946, 70 y.o.   MRN: [id number]  Patient Care Team: Harrison Mons, PA-C as PCP - General (Physician Assistant) Pixie Casino, MD as Consulting Physician (Cardiology) Vevelyn Royals, MD as Consulting Physician (Ophthalmology) [name], MD as Consulting Physician (Ophthalmology)   Chief Complaint  Patient presents with  . Hyperlipidemia    f/u 6 mos    HPI: Presents for medication refill for Pravastatin. Tolerating medication well without side effects. States she had eye surgery about a month ago for a epiretinal membrane removal due to vision problems which went well. She had her follow-up appointment yesterday. States she has not use her Fluticasone since before her eye surgery and feels she is doing well without it for right now, will use as needed if allergies flare up. Menopausal symptoms well-controlled on Estrace, denies hot flashes, night sweats, or side effects from medication. States she is trying to incorporate a healthier diet but denies exercise at this time. Scheduled to have colonoscopy on Friday. Depression and anxiety well-controlled on Zoloft but inquires about weaning off because she is now retired and feels as though she does not have as much stress causing anxiety in her life.    Review of Systems  Constitutional: Negative for activity change, chills, fatigue and fever.  HENT: Negative for congestion, ear pain, rhinorrhea, sinus pressure and sore throat.   Respiratory: Negative for cough, chest tightness, shortness of breath and wheezing.   Cardiovascular: Negative for chest pain, palpitations and leg swelling.  Gastrointestinal: Negative for abdominal pain, blood in stool, constipation, diarrhea, nausea and vomiting.  Genitourinary: Negative for difficulty urinating, dysuria, frequency, hematuria and urgency.  Neurological: Negative for dizziness, weakness, light-headedness and headaches.    Psychiatric/Behavioral: Negative for dysphoric mood. The patient is not nervous/anxious.    Allergies  Allergen Reactions  . Adhesive [Tape]     Burn skin  . Erythromycin     Gi intolerance  . Naproxen Nausea And Vomiting    Abdominal pain   . Penicillins Hives    Has patient had a PCN reaction causing immediate rash, facial/tongue/throat swelling, SOB or lightheadedness with hypotension: UNKNOWN Has patient had a PCN reaction causing severe rash involving mucus membranes or skin necrosis: No Has patient had a PCN reaction that required hospitalization No Has patient had a PCN reaction occurring within the last 10 years: No If all of the above answers are "NO", then may proceed with Cephalosporin use.    Prior to Admission medications   Medication Sig Start Date End Date Taking? Authorizing Provider  estradiol (ESTRACE) 1 MG tablet TAKE ONE TABLET BY MOUTH ONCE DIALY Patient taking differently: Take 1 mg by mouth daily.  12/14/15  Yes Chelle Jeffery, PA-C  flecainide (TAMBOCOR) 100 MG tablet Take 2 tablets by mouth with onset of atrial fib. No more than once a week Patient taking differently: Take 2 tablets by mouth with onset of atrial fib. No more than once a week 09/03/15  Yes Sherran Needs, NP  fluticasone [facility]) 50 MCG/ACT nasal spray Place 2 sprays into both nostrils daily. 03/24/16  Yes Chelle Jeffery, PA-C  ibuprofen (ADVIL,MOTRIN) 200 MG tablet Take 400 mg by mouth every 6 (six) hours as needed for headache.   Yes Historical Provider, MD  metoprolol tartrate (LOPRESSOR) 25 MG tablet TAKE 1/2 TABLET BY MOUTH TWICE DAILY Patient taking differently: Take 12.5 mg by mouth 2 (two) times daily.  10/08/15  Yes Sherran Needs, NP  pravastatin (PRAVACHOL) 40 MG tablet TAKE ONE (1) TABLET BY MOUTH EVERY DAY 09/22/16  Yes Chelle Jeffery, PA-C  prednisoLONE acetate (PRED FORTE) 1 % ophthalmic suspension Place 1 drop into the right eye 4 (four) times daily. 08/26/16  Yes [name],  MD  ranitidine (ZANTAC) 150 MG tablet Take 150 mg by mouth every evening.    Yes Historical Provider, MD  rivaroxaban (XARELTO) 20 MG TABS tablet Take 1 tablet (20 mg total) by mouth daily with supper. Patient taking differently: Take 20 mg by mouth daily with supper. WILL STOP PRIOR TO PROCEDURE 08/31/15  Yes Isaiah Serge, NP  sertraline (ZOLOFT) 25 MG tablet Take 1 tablet (25 mg total) by mouth daily. 10/25/15  Yes Chelle Jeffery, PA-C  XARELTO 20 MG TABS tablet TAKE ONE TABLET BY MOUTH ONCE DAILY WITH SUPPER 09/07/16  Yes Pixie Casino, MD  gatifloxacin (ZYMAXID) 0.5 % SOLN Place 1 drop into the right eye 4 (four) times daily. Patient not taking: Reported on 09/22/2016 08/26/16   [name], MD   Patient Active Problem List   Diagnosis Date Noted  . Preretinal fibrosis, right eye 08/25/2016  . Hyperglycemia 03/26/2015  . PAF (paroxysmal atrial fibrillation) ([place]) 07/10/2013  . Atrial fibrillation with rapid ventricular response ([place]) 02/06/2013  . Family history of coronary artery bypass surgery 02/06/2013  . GERD (gastroesophageal reflux disease) 10/25/2012  . Allergy 10/25/2012  . Hyperlipidemia 10/25/2012  . Depression 10/25/2012  . Anxiety 10/25/2012  . MVP (mitral valve prolapse) 10/25/2012  . DJD (degenerative joint disease) of cervical spine 10/25/2012  . Nephrolithiasis 10/25/2012  . Colon polyps 10/25/2012       Objective:   Physical Exam General: Well-developed, well-nourished, appears stated age and in no apparent distress. HEENT: Normocephalic, atraumatic. Eyes PERRLA, sclera and conjunctiva clear without injection or icterus. Mouth and throat non-erythematous without evidence of tonsillar hypertrophy or exudate. No cobble stoning. Neck supple. No thyromegaly or lymphadenopathy. No tracheal deviations or JVD noted. Pulmonary: Clear to auscultation bilaterally, no wheezes, rhonchi, or rales. No cyanosis or clubbing. Cardiovascular: Regular rate and rhythm with  normal S1 and S2 without murmurs, rubs, or gallops.  Neurological: Awake, alert, oriented. Skin: Skin warm and dry. No rashes noted. Psychiatric: Appropriate mood and affect. Fluent speech and normal behavior.     Assessment & Plan:  1. Hyperlipidemia, unspecified hyperlipidemia type Currently controlled on medication. Lipid panel from April 2017 with elevated Triglycerides (182 mg/dL) and VLDL (36 mg/dL), otherwise unremarkable. Re-evaluation of labs today. Continue Pravastatin 40 mg daily as prescribed and will manage medication changes as needed upon review of labs. - Lipid panel - Comprehensive metabolic panel - pravastatin (PRAVACHOL) 40 MG tablet; TAKE ONE (1) TABLET BY MOUTH EVERY DAY  Dispense: 90 tablet; Refill: 3  2. Hyperglycemia Glucose 111 mg/dL at visit in April 2017 with HgbA1C of 6.2% at same visit. Re-evaluation of labs today. - Hemoglobin A1c - Comprehensive metabolic panel  3. Depression, unspecified depression type States depression and anxiety have decreased. Discussed weaning off of Zoloft by taking half of a 25 mg tablet daily for 2 weeks before stopping completely. Advised patient to do this when she feels ready to stop regimen.  4. Anxiety States depression and anxiety have decreased. Discussed weaning off of Zoloft by taking half of a 25 mg tablet daily for 2 weeks before stopping completely. Advised patient to do this when she feels ready to stop regimen.  5. Allergic state,  subsequent encounter Use Fluticasone nasal spray as needed for allergy symptoms.   6. Flu vaccine need - Flu Vaccine QUAD 36+ mos IM

## 2016-09-22 NOTE — Telephone Encounter (Signed)
Received a call from Butch Penny with Dr.Hung's office.She needed previous note from Dr.Hilty saying ok to hold xarelto.08/13/16 telephone note faxed to Butch Penny at fax # [phone].

## 2016-09-22 NOTE — Patient Instructions (Signed)
   IF you received an x-ray today, you will receive an invoice from [facility]. Please contact [facility] at [phone] with questions or concerns regarding your invoice.   IF you received labwork today, you will receive an invoice from [facility]. Please contact [facility] at [phone] with questions or concerns regarding your invoice.   Our billing staff will not be able to assist you with questions regarding bills from these companies.  You will be contacted with the lab results as soon as they are available. The fastest way to get your results is to activate your My Chart account. Instructions are located on the last page of this paperwork. If you have not heard from us regarding the results in 2 weeks, please contact this office.    Influenza (Flu) Vaccine (Inactivated or Recombinant):  1. Why get vaccinated? Influenza ("flu") is a contagious disease that spreads around the [place] every year, usually between October and May. Flu is caused by influenza viruses, and is spread mainly by coughing, sneezing, and close contact. Anyone can get flu. Flu strikes suddenly and can last several days. Symptoms vary by age, but can include:  fever/chills  sore throat  muscle aches  fatigue  cough  headache  runny or stuffy nose Flu can also lead to pneumonia and blood infections, and cause diarrhea and seizures in children. If you have a medical condition, such as heart or lung disease, flu can make it worse. Flu is more dangerous for some people. Infants and young children, people 65 years of age and older, pregnant women, and people with certain health conditions or a weakened immune system are at greatest risk. Each year thousands of people in the [place] die from flu, and many more are hospitalized. Flu vaccine can:  keep you from getting flu,  make flu less severe if you do get it, and  keep you from spreading flu to  your family and other people. 2. Inactivated and recombinant flu vaccines A dose of flu vaccine is recommended every flu season. Children 6 months through 8 years of age may need two doses during the same flu season. Everyone else needs only one dose each flu season. Some inactivated flu vaccines contain a very small amount of a mercury-based preservative called thimerosal. Studies have not shown thimerosal in vaccines to be harmful, but flu vaccines that do not contain thimerosal are available. There is no live flu virus in flu shots. They cannot cause the flu. There are many flu viruses, and they are always changing. Each year a new flu vaccine is made to protect against three or four viruses that are likely to cause disease in the upcoming flu season. But even when the vaccine doesn't exactly match these viruses, it may still provide some protection. Flu vaccine cannot prevent:  flu that is caused by a virus not covered by the vaccine, or  illnesses that look like flu but are not. It takes about 2 weeks for protection to develop after vaccination, and protection lasts through the flu season. 3. Some people should not get this vaccine Tell the person who is giving you the vaccine:  If you have any severe, life-threatening allergies. If you ever had a life-threatening allergic reaction after a dose of flu vaccine, or have a severe allergy to any part of this vaccine, you may be advised not to get vaccinated. Most, but not all, types of flu vaccine contain a small amount of egg protein.    If you ever had Guillain-Barre Syndrome (also called GBS). Some people with a history of GBS should not get this vaccine. This should be discussed with your doctor.  If you are not feeling well. It is usually okay to get flu vaccine when you have a mild illness, but you might be asked to come back when you feel better. 4. Risks of a vaccine reaction With any medicine, including vaccines, there is a chance of  reactions. These are usually mild and go away on their own, but serious reactions are also possible. Most people who get a flu shot do not have any problems with it. Minor problems following a flu shot include:  soreness, redness, or swelling where the shot was given  hoarseness  sore, red or itchy eyes  cough  fever  aches  headache  itching  fatigue If these problems occur, they usually begin soon after the shot and last 1 or 2 days. More serious problems following a flu shot can include the following:  There may be a small increased risk of Guillain-Barre Syndrome (GBS) after inactivated flu vaccine. This risk has been estimated at 1 or 2 additional cases per million people vaccinated. This is much lower than the risk of severe complications from flu, which can be prevented by flu vaccine.  Young children who get the flu shot along with pneumococcal vaccine (PCV13) and/or DTaP vaccine at the same time might be slightly more likely to have a seizure caused by fever. Ask your doctor for more information. Tell your doctor if a child who is getting flu vaccine has ever had a seizure. Problems that could happen after any injected vaccine:  People sometimes faint after a medical procedure, including vaccination. Sitting or lying down for about 15 minutes can help prevent fainting, and injuries caused by a fall. Tell your doctor if you feel dizzy, or have vision changes or ringing in the ears.  Some people get severe pain in the shoulder and have difficulty moving the arm where a shot was given. This happens very rarely.  Any medication can cause a severe allergic reaction. Such reactions from a vaccine are very rare, estimated at about 1 in a million doses, and would happen within a few minutes to a few hours after the vaccination. As with any medicine, there is a very remote chance of a vaccine causing a serious injury or death. The safety of vaccines is always being monitored. For  more information, visit: www.cdc.gov/vaccinesafety/ 5. What if there is a serious reaction? What should I look for?  Look for anything that concerns you, such as signs of a severe allergic reaction, very high fever, or unusual behavior. Signs of a severe allergic reaction can include hives, swelling of the face and throat, difficulty breathing, a fast heartbeat, dizziness, and weakness. These would start a few minutes to a few hours after the vaccination. What should I do?  If you think it is a severe allergic reaction or other emergency that can't wait, call 9-1-1 and get the person to the nearest hospital. Otherwise, call your doctor.  Reactions should be reported to the Vaccine Adverse Event Reporting System (VAERS). Your doctor should file this report, or you can do it yourself through the VAERS web site at www.vaers.hhs.gov, or by calling 1-800-822-7967. VAERS does not give medical advice. 6. The National Vaccine Injury Compensation Program The National Vaccine Injury Compensation Program (VICP) is a federal program that was created to compensate people who may have been   injured by certain vaccines. Persons who believe they may have been injured by a vaccine can learn about the program and about filing a claim by calling 1-800-338-2382 or visiting the VICP website at www.hrsa.gov/vaccinecompensation. There is a time limit to file a claim for compensation. 7. How can I learn more?  Ask your healthcare provider. He or she can give you the vaccine package insert or suggest other sources of information.  Call your local or state health department.  Contact the Centers for Disease Control and Prevention (CDC):  Call 1-800-232-4636 (1-800-CDC-INFO) or  Visit CDC's website at www.cdc.gov/flu Vaccine Information Statement Inactivated Influenza Vaccine (07/06/2014)   This information is not intended to replace advice given to you by your health care provider. Make sure you discuss any  questions you have with your health care provider.   Document Released: 09/10/2006 Document Revised: 12/07/2014 Document Reviewed: 07/09/2014 Elsevier Interactive Patient Education 2016 Elsevier Inc.  

## 2016-09-22 NOTE — Progress Notes (Signed)
Patient ID: Daisy Nelson, female    DOB: 1946/05/04, 70 y.o.   MRN: [id number]  PCP: Harrison Mons, PA-C  Subjective:   Chief Complaint  Patient presents with  . Hyperlipidemia    f/u 6 mos    HPI Presents for evaluation of hyperlipidemia.  She feels well, tolerates her medications without adverse effects. Recently had eye surgery (preretinal fibrosis)and has experienced good results. Colonoscopy is next month. Is interested in stopping sertraline, as she no longer feels stressed, anxious or depressed. The symptoms were primarily related to her job, which she no longer has.    Review of Systems  Constitutional: Negative.   HENT: Negative for sore throat.   Eyes: Negative for visual disturbance.  Respiratory: Negative for cough, chest tightness, shortness of breath and wheezing.   Cardiovascular: Negative for chest pain and palpitations.  Gastrointestinal: Negative for abdominal pain, diarrhea, nausea and vomiting.  Genitourinary: Negative for dysuria, frequency, hematuria and urgency.  Musculoskeletal: Negative for arthralgias and myalgias.  Skin: Negative for rash.  Neurological: Negative for dizziness, weakness and headaches.  Psychiatric/Behavioral: Negative for decreased concentration. The patient is not nervous/anxious.        Patient Active Problem List   Diagnosis Date Noted  . Preretinal fibrosis, right eye 08/25/2016  . Hyperglycemia 03/26/2015  . PAF (paroxysmal atrial fibrillation) ([place]) 07/10/2013  . Atrial fibrillation with rapid ventricular response ([place]) 02/06/2013  . Family history of coronary artery bypass surgery 02/06/2013  . GERD (gastroesophageal reflux disease) 10/25/2012  . Allergy 10/25/2012  . Hyperlipidemia 10/25/2012  . Depression 10/25/2012  . Anxiety 10/25/2012  . MVP (mitral valve prolapse) 10/25/2012  . DJD (degenerative joint disease) of cervical spine 10/25/2012  . Nephrolithiasis 10/25/2012  . Colon polyps 10/25/2012      Prior to Admission medications   Medication Sig Start Date End Date Taking? Authorizing Provider  estradiol (ESTRACE) 1 MG tablet TAKE ONE TABLET BY MOUTH ONCE DIALY Patient taking differently: Take 1 mg by mouth daily.  12/14/15  Yes Demichael Traum, PA-C  flecainide (TAMBOCOR) 100 MG tablet Take 2 tablets by mouth with onset of atrial fib. No more than once a week Patient taking differently: Take 2 tablets by mouth with onset of atrial fib. No more than once a week 09/03/15  Yes Sherran Needs, NP  fluticasone [facility]) 50 MCG/ACT nasal spray Place 2 sprays into both nostrils daily. 03/24/16  Yes Kimm Sider, PA-C  ibuprofen (ADVIL,MOTRIN) 200 MG tablet Take 400 mg by mouth every 6 (six) hours as needed for headache.   Yes Historical Provider, MD  metoprolol tartrate (LOPRESSOR) 25 MG tablet TAKE 1/2 TABLET BY MOUTH TWICE DAILY Patient taking differently: Take 12.5 mg by mouth 2 (two) times daily.  10/08/15  Yes Sherran Needs, NP  pravastatin (PRAVACHOL) 40 MG tablet TAKE ONE (1) TABLET BY MOUTH EVERY DAY 08/18/16  Yes Caelan Atchley, PA-C  prednisoLONE acetate (PRED FORTE) 1 % ophthalmic suspension Place 1 drop into the right eye 4 (four) times daily. 08/26/16  Yes [name], MD  ranitidine (ZANTAC) 150 MG tablet Take 150 mg by mouth every evening.    Yes Historical Provider, MD  rivaroxaban (XARELTO) 20 MG TABS tablet Take 1 tablet (20 mg total) by mouth daily with supper. Patient taking differently: Take 20 mg by mouth daily with supper. WILL STOP PRIOR TO PROCEDURE 08/31/15  Yes Isaiah Serge, NP  sertraline (ZOLOFT) 25 MG tablet Take 1 tablet (25 mg total) by mouth daily. 10/25/15  Yes Justus Duerr, PA-C  XARELTO 20 MG TABS tablet TAKE ONE TABLET BY MOUTH ONCE DAILY WITH SUPPER 09/07/16  Yes Pixie Casino, MD  gatifloxacin (ZYMAXID) 0.5 % SOLN Place 1 drop into the right eye 4 (four) times daily. Patient not taking: Reported on 09/22/2016 08/26/16   [name], MD      Allergies  Allergen Reactions  . Adhesive [Tape]     Burn skin  . Erythromycin     Gi intolerance  . Naproxen Nausea And Vomiting    Abdominal pain   . Penicillins Hives    Has patient had a PCN reaction causing immediate rash, facial/tongue/throat swelling, SOB or lightheadedness with hypotension: UNKNOWN Has patient had a PCN reaction causing severe rash involving mucus membranes or skin necrosis: No Has patient had a PCN reaction that required hospitalization No Has patient had a PCN reaction occurring within the last 10 years: No If all of the above answers are "NO", then may proceed with Cephalosporin use.        Objective:  Physical Exam  Constitutional: She is oriented to person, place, and time. She appears well-developed and well-nourished. She is active and cooperative. No distress.  BP 120/70   Pulse (!) 56   Temp 97.8 F (36.6 C) (Oral)   Resp 16   Ht 5' 4.5" (1.638 m)   Wt 163 lb (73.9 kg)   SpO2 96%   BMI 27.55 kg/m   HENT:  Head: Normocephalic and atraumatic.  Right Ear: Hearing normal.  Left Ear: Hearing normal.  Eyes: Conjunctivae are normal. No scleral icterus.  Neck: Normal range of motion. Neck supple. No thyromegaly present.  Cardiovascular: Regular rhythm and normal heart sounds.  Bradycardia present.   Pulses:      Radial pulses are 2+ on the right side, and 2+ on the left side.  Pulmonary/Chest: Effort normal and breath sounds normal.  Lymphadenopathy:       Head (right side): No tonsillar, no preauricular, no posterior auricular and no occipital adenopathy present.       Head (left side): No tonsillar, no preauricular, no posterior auricular and no occipital adenopathy present.    She has no cervical adenopathy.       Right: No supraclavicular adenopathy present.       Left: No supraclavicular adenopathy present.  Neurological: She is alert and oriented to person, place, and time. No sensory deficit.  Skin: Skin is warm, dry and intact.  No rash noted. No cyanosis or erythema. Nails show no clubbing.  Psychiatric: She has a normal mood and affect. Her speech is normal and behavior is normal.           Assessment & Plan:   1. Hyperlipidemia, unspecified hyperlipidemia type Await labs. Adjust regimen as indicated by results. - Lipid panel - Comprehensive metabolic panel - pravastatin (PRAVACHOL) 40 MG tablet; TAKE ONE (1) TABLET BY MOUTH EVERY DAY  Dispense: 90 tablet; Refill: 3  2. Hyperglycemia Await labs. Adjust regimen as indicated by results. - Hemoglobin A1c - Comprehensive metabolic panel  3. Depression, unspecified depression type 4. Anxiety Stable. Controlled. Recommend waiting to stop sertraline until spring, but she only takes 25 mg now. When ready, reduce to 1/2 tablet x 2 weeks, then stop.  5. Allergic state, subsequent encounter Controlled with current regimen.  6. Flu vaccine need - Flu Vaccine QUAD 36+ mos IM   Fara Chute, PA-C Physician Assistant-Certified [facility]

## 2016-09-23 LAB — HEMOGLOBIN A1C
Hgb A1c MFr Bld: 5.9 % — ABNORMAL HIGH (ref <5.7)
Mean Plasma Glucose: 123 mg/dL

## 2016-09-24 DIAGNOSIS — Z1211 Encounter for screening for malignant neoplasm of colon: Secondary | ICD-10-CM | POA: Diagnosis not present

## 2016-09-24 DIAGNOSIS — D120 Benign neoplasm of cecum: Secondary | ICD-10-CM | POA: Diagnosis not present

## 2016-09-24 DIAGNOSIS — K5730 Diverticulosis of large intestine without perforation or abscess without bleeding: Secondary | ICD-10-CM | POA: Diagnosis not present

## 2016-09-24 DIAGNOSIS — D122 Benign neoplasm of ascending colon: Secondary | ICD-10-CM | POA: Diagnosis not present

## 2016-09-24 DIAGNOSIS — K635 Polyp of colon: Secondary | ICD-10-CM | POA: Diagnosis not present

## 2016-09-24 DIAGNOSIS — D125 Benign neoplasm of sigmoid colon: Secondary | ICD-10-CM | POA: Diagnosis not present

## 2016-09-24 LAB — HM COLONOSCOPY

## 2016-09-29 ENCOUNTER — Encounter: Payer: Self-pay | Admitting: Physician Assistant

## 2016-10-02 ENCOUNTER — Encounter: Payer: Self-pay | Admitting: Physician Assistant

## 2016-12-02 ENCOUNTER — Encounter (INDEPENDENT_AMBULATORY_CARE_PROVIDER_SITE_OTHER): Payer: PPO | Admitting: Ophthalmology

## 2016-12-02 DIAGNOSIS — H35371 Puckering of macula, right eye: Secondary | ICD-10-CM

## 2016-12-02 DIAGNOSIS — H43812 Vitreous degeneration, left eye: Secondary | ICD-10-CM | POA: Diagnosis not present

## 2016-12-06 ENCOUNTER — Other Ambulatory Visit: Payer: Self-pay | Admitting: Physician Assistant

## 2016-12-08 ENCOUNTER — Other Ambulatory Visit: Payer: Self-pay | Admitting: Physician Assistant

## 2016-12-09 ENCOUNTER — Other Ambulatory Visit: Payer: Self-pay | Admitting: Physician Assistant

## 2016-12-11 ENCOUNTER — Telehealth: Payer: Self-pay | Admitting: *Deleted

## 2016-12-11 NOTE — Telephone Encounter (Signed)
Patient is returning a call to [facility] for 3 request that has came from her pharmacy and 2 of the request that were received was in error on the pharmacy part. Please advise with any questions [phone]

## 2017-01-05 ENCOUNTER — Other Ambulatory Visit (HOSPITAL_COMMUNITY): Payer: Self-pay | Admitting: Nurse Practitioner

## 2017-01-06 ENCOUNTER — Ambulatory Visit (INDEPENDENT_AMBULATORY_CARE_PROVIDER_SITE_OTHER): Payer: PPO | Admitting: Family Medicine

## 2017-01-06 VITALS — BP 128/76 | HR 55 | Temp 97.8°F | Resp 16 | Ht 65.0 in | Wt 160.0 lb

## 2017-01-06 DIAGNOSIS — M545 Low back pain, unspecified: Secondary | ICD-10-CM

## 2017-01-06 MED ORDER — CYCLOBENZAPRINE HCL 10 MG PO TABS: 10.0000 mg | ORAL_TABLET | Freq: Three times a day (TID) | ORAL | 0 refills | Status: DC | PRN

## 2017-01-06 MED ORDER — TRAMADOL HCL 50 MG PO TABS: 50.0000 mg | ORAL_TABLET | Freq: Three times a day (TID) | ORAL | 0 refills | Status: DC | PRN

## 2017-01-06 NOTE — Patient Instructions (Addendum)
You have a bad muscle spasm in your lower back.  This is causing the pain you are feeling.  Take the Flexeril as a muscle relaxer at night. This may make you drowsy and if it does do not take it during the day.  You can also take Tramadol 1-2 pills for severe pain relief.    Heat and massage are also great to help relieve the pain.  This can last for the next 7-10 days. If you're still having issues in the next 2 weeks come back and see [place].  If you start having worsening pain despite the treatment don't wait and come back immediately.  It was good to meet you today!   IF you received an x-ray today, you will receive an invoice from [facility]. Please contact [facility] at [phone] with questions or concerns regarding your invoice.   IF you received labwork today, you will receive an invoice from [facility]. Please contact [facility] at [phone] with questions or concerns regarding your invoice.   Our billing staff will not be able to assist you with questions regarding bills from these companies.  You will be contacted with the lab results as soon as they are available. The fastest way to get your results is to activate your My Chart account. Instructions are located on the last page of this paperwork. If you have not heard from [place] regarding the results in 2 weeks, please contact this office.

## 2017-01-06 NOTE — Progress Notes (Signed)
Daisy Nelson is a 71 y.o. female who presents to [place] [facility] today for low back pain:  1.  Low back pain:  Patient was bending over on Saturday working around her house when she had sharp stabbing pain in bilateral region of her lower back. She has history of back spasm in the past. This feels as that has been the past. No actual injury to her back now or in the past. It is bilateral. It is 8 out of 10. She's been takig of ibuprofen at home with some relief of her symptoms.  No radiation to buttocks. No paresthesias or numbness/weakness bilateral lower extremity's.  No LE weakness or changes in gait.  No motor weakness/decreased sensation BL LE's.  No fevers or chills.  No dysuria, hematuria, urinary frequency, incontinence of bladder or bowel.     ROS as above.   PMH reviewed. Patient is a nonsmoker.   Past Medical History:  Diagnosis Date  . AF (atrial fibrillation) ([place])    AF with RVR 01/2013  . Allergy   . Anxiety   . Atrial fibrillation with RVR ([place]) 08/31/2015  . Colon polyps   . Complication of anesthesia 2011   colonscopy- "slow to awaken"  . Depression   . DJD (degenerative joint disease) of cervical spine   . Family history of CABG   . GERD (gastroesophageal reflux disease)   . History of kidney stones   . Hyperglycemia   . Hyperlipidemia   . Jaw pain 08/31/2015  . Pneumonia   . PONV (postoperative nausea and vomiting)    Past Surgical History:  Procedure Laterality Date  . [place] VITRECTOMY WITH 20 GAUGE MVR PORT FOR MACULAR HOLE Right 08/25/2016   Procedure: 25 GAUGE PARS PLANA VITRECTOMY; RIGHT EYE;  Surgeon: [name], MD;  Location: [place];  Service: Ophthalmology;  Laterality: Right;  . ABDOMINAL HYSTERECTOMY  1991  . CATARACT EXTRACTION W/ INTRAOCULAR LENS  IMPLANT, BILATERAL Bilateral 03/2013  . CESAREAN SECTION  1976  . COLONOSCOPY  2011   polyps  . EYE SURGERY    . GANGLION CYST EXCISION Left    hand  . GAS/FLUID  EXCHANGE Right 08/25/2016   Procedure: GAS/FLUID EXCHANGE RIGHT EYE;  Surgeon: [name], MD;  Location: [place];  Service: Ophthalmology;  Laterality: Right;  . IRIDECTOMY Right 08/25/2016   Procedure: PERIPHERAL IRIDECTOMY RIGHT EYE;  Surgeon: [name], MD;  Location: [place];  Service: Ophthalmology;  Laterality: Right;  . LAPAROSCOPY  1976  . MEMBRANE PEEL Right 08/25/2016   Procedure: MEMBRANE PEEL RIGHT EYE;  Surgeon: [name], MD;  Location: [place];  Service: Ophthalmology;  Laterality: Right;  . NM MYOCAR PERF WALL MOTION  01/2013   lexiscan - no pharmacologically induced ischemia, fixed defects in apical segments of anterior septum & inferior lateral wall   . PARS PLANA VITRECTOMY Right 08/25/2016   laser, gas injection, AC wash out. Membrane peel right eye  . TRANSTHORACIC ECHOCARDIOGRAM  01/2013   EF 55-60%, mod conc LVH; mild MR with mildly thickened leaflets; mild TR; PA peak pressure 45mmHg  . TUBAL LIGATION  1977    Medications reviewed. Current Outpatient Prescriptions  Medication Sig Dispense Refill  . estradiol (ESTRACE) 1 MG tablet TAKE ONE TABLET BY MOUTH ONCE DAILY 90 tablet 0  . flecainide (TAMBOCOR) 100 MG tablet Take 2 tablets by mouth with onset of atrial fib. No more than once a week (Patient taking  differently: Take 2 tablets by mouth with onset of atrial fib. No more than once a week) 6 tablet 0  . fluticasone (FLONASE) 50 MCG/ACT nasal spray Place 2 sprays into both nostrils daily. 16 g 12  . ibuprofen (ADVIL,MOTRIN) 200 MG tablet Take 400 mg by mouth every 6 (six) hours as needed for headache.    . metoprolol tartrate (LOPRESSOR) 25 MG tablet TAKE 1/2 TABLET BY MOUTH TWICE DAILY 60 tablet 0  . pravastatin (PRAVACHOL) 40 MG tablet TAKE ONE (1) TABLET BY MOUTH EVERY DAY 90 tablet 3  . ranitidine (ZANTAC) 150 MG tablet Take 150 mg by mouth every evening.     . rivaroxaban (XARELTO) 20 MG TABS tablet Take 1 tablet (20 mg total) by mouth daily with supper.  (Patient taking differently: Take 20 mg by mouth daily with supper. WILL STOP PRIOR TO PROCEDURE) 30 tablet 0  . sertraline (ZOLOFT) 25 MG tablet Take 1 tablet (25 mg total) by mouth daily. 90 tablet 3  . XARELTO 20 MG TABS tablet TAKE ONE TABLET BY MOUTH ONCE DAILY WITH SUPPER 30 tablet 6   No current facility-administered medications for this visit.      Physical Exam:  BP 128/76 (BP Location: Right Arm, Patient Position: Sitting, Cuff Size: Normal)   Pulse (!) 55   Temp 97.8 F (36.6 C) (Oral)   Resp 16   Ht 5\' 5"  (1.651 m)   Wt 160 lb (72.6 kg)   SpO2 98%   BMI 26.63 kg/m  Gen:  Alert, cooperative patient who appears stated age in no acute distress.  Vital signs reviewed. HEENT: EOMI,  MMM Back:  Normal skin, Spine with normal alignment and no deformity.  No tenderness to vertebral process palpation.  Paraspinous muscles are tender and with spasm BL lumbar region.  Range of motion is full at neck but decreased forward flexion in lumbar sacral regions.  Straight leg raise is positive bilaterally for ipsilateral pain, mild. Neuro:  Sensation and motor function 5/5 bilateral lower extremities.  Patellar and Achilles  DTR's +2 patellar BL.  She is able to walk on his heels and toes without difficulty.     Assessment and Plan:  1.  Lumbago: -Lumbar strain.  No evidence of sciatic involvement.  NO red flags Plan of rest, heat.    Limit NSAID use in light of concomitant Xarelto.  I have sent in Tramadol for severe pain relief.  Flexeril PRN.   Consider Physical Therapy and XRay studies if not improving.  Call or return to clinic prn if these symptoms worsen or fail to improve as anticipated.  Return immediately if worsening.

## 2017-01-18 ENCOUNTER — Encounter: Payer: Self-pay | Admitting: Internal Medicine

## 2017-01-18 ENCOUNTER — Ambulatory Visit (INDEPENDENT_AMBULATORY_CARE_PROVIDER_SITE_OTHER): Payer: PPO | Admitting: Internal Medicine

## 2017-01-18 VITALS — BP 132/60 | HR 57 | Ht 65.0 in | Wt 162.0 lb

## 2017-01-18 DIAGNOSIS — E785 Hyperlipidemia, unspecified: Secondary | ICD-10-CM | POA: Diagnosis not present

## 2017-01-18 DIAGNOSIS — I480 Paroxysmal atrial fibrillation: Secondary | ICD-10-CM | POA: Diagnosis not present

## 2017-01-18 MED ORDER — FLECAINIDE ACETATE 100 MG PO TABS: ORAL_TABLET | ORAL | 5 refills | Status: DC

## 2017-01-18 NOTE — Progress Notes (Signed)
OFFICE NOTE  Chief Complaint:  Occasional feet swelling  Primary Care Physician: Daisy Mons, PA-C  ID:  Daisy Nelson, DOB November 11, 1946, MRN [id number]  PCP:  Daisy Mons, PA-C  Primary Cardiologist:  Hilty     History of Present Illness: Daisy Nelson is a 71 y.o. female no prior history of CAD or AF, presented to the ER 3/10 around 3am with complaints of jaw pain, palpitations, and SOB. Her symptoms woke her from sleep around 12:30am. In the ER she was noted to be in Rapid AF. Her EKG shows diffuse ST depression. Initial Troponin is negative. Her symptoms improved after Diltiazem IV was started. She denied any prior history of similar symptoms in the past. She did take Mucinex D earlier this week for sinus infection.   The patient was admitted to [facility] and started on diltiazem, heparin, ASA, beta blocker and nitrates. She spontaneously converted to NSR and ruled out for MI. Lexiscan myovew revealed normal LVF, no ischemia and fixed defects involving the apical segments of the inferior septum and inferior lateral walls. 2D echo confirmed normal EF of 55-60%. Mild TR. CHADSVSC= 1. She was started on Xarelto.  Lopressor was continued.   She was last seen by Tarri Fuller, PA-C, as she was about to have cataract surgery.  It went very well for her and she can now see clearly.  She reports only an accessional fluttering in her chest.  I did decrease her lopressor to 12.5 mg bid during the last visit because she was having a HR in the 40's.  She otherwise denies  nausea, vomiting, fever, chest pain, shortness of breath, orthopnea, dizziness, PND, cough, congestion, abdominal pain, hematochezia, melena, lower extremity edema.  [name] back in the office today. She is currently without complaints. She is active denies any chest pain or shortness of breath. She very rarely gets palpitations the last only for a few seconds. She denies any recurrent atrial fibrillation and her EKG today  sinus rhythm. She's had no bleeding problems on aspirin. She had laboratory work in October which shows a well controlled lipid profile on pravastatin. Weight is stable  I saw [name] back today in the office. Overall she is doing very well. She denies any chest pain or shortness of breath. She's had no recurrent A. Fib episodes. She has not needed to take her flecainide as needed. She continues on Xarelto without any bleeding problems. Blood pressure is well-controlled today.  01/18/2017  [name] returns today for follow-up. She reports no significant recurrent atrial fibrillation. EKG today shows sinus bradycardia with nonspecific ST changes. She is tolerating Xarelto without any bleeding problems. She has not needed the flecainide pocket pill strategy, but is due for a refill. QTC is 395 ms.   Wt Readings from Last 3 Encounters:  01/18/17 162 lb (73.5 kg)  01/06/17 160 lb (72.6 kg)  09/22/16 163 lb (73.9 kg)     Past Medical History:  Diagnosis Date  . AF (atrial fibrillation) ([place])    AF with RVR 01/2013  . Allergy   . Anxiety   . Atrial fibrillation with RVR ([place]) 08/31/2015  . Colon polyps   . Complication of anesthesia 2011   colonscopy- "slow to awaken"  . Depression   . DJD (degenerative joint disease) of cervical spine   . Family history of CABG   . GERD (gastroesophageal reflux disease)   . History of kidney stones   . Hyperglycemia   . Hyperlipidemia   .  Jaw pain 08/31/2015  . Pneumonia   . PONV (postoperative nausea and vomiting)     Current Outpatient Prescriptions  Medication Sig Dispense Refill  . estradiol (ESTRACE) 1 MG tablet TAKE ONE TABLET BY MOUTH ONCE DAILY 90 tablet 0  . flecainide (TAMBOCOR) 100 MG tablet Take 2 tablets by mouth with onset of atrial fib. No more than once a week 6 tablet 5  . fluticasone (FLONASE) 50 MCG/ACT nasal spray Place 2 sprays into both nostrils daily. 16 g 12  . ibuprofen (ADVIL,MOTRIN) 200 MG tablet Take 400 mg by  mouth every 6 (six) hours as needed for headache.    . metoprolol tartrate (LOPRESSOR) 25 MG tablet TAKE 1/2 TABLET BY MOUTH TWICE DAILY 60 tablet 0  . pravastatin (PRAVACHOL) 40 MG tablet TAKE ONE (1) TABLET BY MOUTH EVERY DAY 90 tablet 3  . ranitidine (ZANTAC) 150 MG tablet Take 150 mg by mouth every evening.     . rivaroxaban (XARELTO) 20 MG TABS tablet Take 1 tablet (20 mg total) by mouth daily with supper. (Patient taking differently: Take 20 mg by mouth daily with supper. WILL STOP PRIOR TO PROCEDURE) 30 tablet 0  . XARELTO 20 MG TABS tablet TAKE ONE TABLET BY MOUTH ONCE DAILY WITH SUPPER 30 tablet 6   No current facility-administered medications for this visit.     Allergies:    Allergies  Allergen Reactions  . Adhesive [Tape]     Burn skin  . Erythromycin     Gi intolerance  . Naproxen Nausea And Vomiting    Abdominal pain   . Penicillins Hives    Has patient had a PCN reaction causing immediate rash, facial/tongue/throat swelling, SOB or lightheadedness with hypotension: UNKNOWN Has patient had a PCN reaction causing severe rash involving mucus membranes or skin necrosis: No Has patient had a PCN reaction that required hospitalization No Has patient had a PCN reaction occurring within the last 10 years: No If all of the above answers are "NO", then may proceed with Cephalosporin use.     Social History:  The patient  reports that she quit smoking about 12 years ago. Her smoking use included Cigarettes. She started smoking about 53 years ago. She has a 18.00 pack-year smoking history. She has never used smokeless tobacco. She reports that she does not drink alcohol or use drugs.   Family history:   Family History  Problem Relation Age of Onset  . Stroke Mother   . Diabetes Father   . Heart disease Father     CABG  . Arthritis Father     C6-7  . Skin cancer Father     SCC  . Arthritis Brother     s/p THR    ROS: A comprehensive review of systems was  negative.  HOME MEDS: Current Outpatient Prescriptions  Medication Sig Dispense Refill  . estradiol (ESTRACE) 1 MG tablet TAKE ONE TABLET BY MOUTH ONCE DAILY 90 tablet 0  . flecainide (TAMBOCOR) 100 MG tablet Take 2 tablets by mouth with onset of atrial fib. No more than once a week 6 tablet 5  . fluticasone (FLONASE) 50 MCG/ACT nasal spray Place 2 sprays into both nostrils daily. 16 g 12  . ibuprofen (ADVIL,MOTRIN) 200 MG tablet Take 400 mg by mouth every 6 (six) hours as needed for headache.    . metoprolol tartrate (LOPRESSOR) 25 MG tablet TAKE 1/2 TABLET BY MOUTH TWICE DAILY 60 tablet 0  . pravastatin (PRAVACHOL) 40 MG tablet TAKE ONE (  1) TABLET BY MOUTH EVERY DAY 90 tablet 3  . ranitidine (ZANTAC) 150 MG tablet Take 150 mg by mouth every evening.     . rivaroxaban (XARELTO) 20 MG TABS tablet Take 1 tablet (20 mg total) by mouth daily with supper. (Patient taking differently: Take 20 mg by mouth daily with supper. WILL STOP PRIOR TO PROCEDURE) 30 tablet 0  . XARELTO 20 MG TABS tablet TAKE ONE TABLET BY MOUTH ONCE DAILY WITH SUPPER 30 tablet 6   No current facility-administered medications for this visit.     LABS/IMAGING: No results found for this or any previous visit (from the past 48 hour(s)). No results found.  VITALS: BP 132/60   Pulse (!) 57   Ht 5\' 5"  (1.651 m)   Wt 162 lb (73.5 kg)   BMI 26.96 kg/m   EXAM: General appearance: alert and no distress Neck: no carotid bruit and no JVD Lungs: clear to auscultation bilaterally Heart: regular rate and rhythm, S1, S2 normal, no murmur, click, rub or gallop Abdomen: soft, non-tender; bowel sounds normal; no masses,  no organomegaly Extremities: extremities normal, atraumatic, no cyanosis or edema Pulses: 2+ and symmetric Skin: Skin color, texture, turgor normal. No rashes or lesions Neurologic: Grossly normal Psych: Mood, affect normal  EKG: Sinus bradycardia 57, nonspecific ST changes, QTC 395  ms  ASSESSMENT: 1. Paroxysmal atrial fibrillation - no significant recurrence, low CHADSVASC score of 1 (female >65) on Xarelto 2. Pocket pill flecainide strategy 3. Dyslipidemia- at goal  PLAN: 1.   Mrs. Pinks says that she's been pretty stable with regard to her palpitations. He has not required flecainide pocket pill strategy. She remains on Xarelto without any bleeding problems. Her cholesterol is at goal. Blood pressure at goal weight is slightly above the normal range. Otherwise doing well. Continue current medications we'll see her back annually or sooner as necessary.  Pixie Casino, MD, [facility] Attending Cardiologist [place] C Hilty 01/18/2017, 7:36 PM

## 2017-01-18 NOTE — Patient Instructions (Addendum)
Your physician wants you to follow-up in: ONE YEAR with Dr. Hilty. You will receive a reminder letter in the mail two months in advance. If you don't receive a letter, please call our office to schedule the follow-up appointment.  

## 2017-03-07 ENCOUNTER — Other Ambulatory Visit: Payer: Self-pay | Admitting: Physician Assistant

## 2017-03-08 ENCOUNTER — Other Ambulatory Visit (HOSPITAL_COMMUNITY): Payer: Self-pay | Admitting: Internal Medicine

## 2017-03-23 ENCOUNTER — Ambulatory Visit (INDEPENDENT_AMBULATORY_CARE_PROVIDER_SITE_OTHER): Payer: PPO | Admitting: Physician Assistant

## 2017-03-23 ENCOUNTER — Encounter: Payer: Self-pay | Admitting: Physician Assistant

## 2017-03-23 VITALS — BP 124/74 | HR 58 | Temp 98.2°F | Resp 16 | Ht 64.75 in | Wt 161.6 lb

## 2017-03-23 DIAGNOSIS — R7309 Other abnormal glucose: Secondary | ICD-10-CM

## 2017-03-23 DIAGNOSIS — J309 Allergic rhinitis, unspecified: Secondary | ICD-10-CM | POA: Diagnosis not present

## 2017-03-23 DIAGNOSIS — E785 Hyperlipidemia, unspecified: Secondary | ICD-10-CM

## 2017-03-23 DIAGNOSIS — D696 Thrombocytopenia, unspecified: Secondary | ICD-10-CM

## 2017-03-23 DIAGNOSIS — Z79890 Hormone replacement therapy (postmenopausal): Secondary | ICD-10-CM | POA: Diagnosis not present

## 2017-03-23 MED ORDER — ESTRADIOL 1 MG PO TABS: 1.0000 mg | ORAL_TABLET | Freq: Every day | ORAL | 3 refills | Status: DC

## 2017-03-23 MED ORDER — FLUTICASONE PROPIONATE 50 MCG/ACT NA SUSP: 2.0000 | Freq: Every day | NASAL | 3 refills | Status: DC

## 2017-03-23 NOTE — Progress Notes (Signed)
Patient ID: Daisy Nelson, female    DOB: 24-Nov-1946, 71 y.o.   MRN: [id number]  PCP: Harrison Mons, PA-C  Chief Complaint  Patient presents with  . Follow-up    6 mos  . Hyperlipidemia    Subjective:   Presents for evaluation of hyperlipidemia.  She is doing well, feels good. No anxiety/depression since her retirement. Tolerating her medications well. LDL in 08/2016 was 109, and Dr. Debara Pickett suggested she may need a more potent statin to lower it <100. She has had no episodes of AFib. No CP, SOB, HA, dizziness.  She needs refills of Flonase and estradiol. Allergies are controlled. Current with breast cancer screenings.     Review of Systems  Constitutional: Negative.   HENT: Negative for sore throat.   Eyes: Negative for visual disturbance.  Respiratory: Negative for cough, chest tightness, shortness of breath and wheezing.   Cardiovascular: Negative for chest pain and palpitations.  Gastrointestinal: Negative for abdominal pain, diarrhea, nausea and vomiting.  Genitourinary: Negative for dysuria, frequency, hematuria and urgency.  Musculoskeletal: Negative for arthralgias and myalgias.  Skin: Negative for rash.  Neurological: Negative for dizziness, weakness and headaches.  Psychiatric/Behavioral: Negative for decreased concentration. The patient is not nervous/anxious.        Patient Active Problem List   Diagnosis Date Noted  . Hormone replacement therapy 03/23/2017  . Preretinal fibrosis, right eye 08/25/2016  . Hyperglycemia 03/26/2015  . PAF (paroxysmal atrial fibrillation) ([place]) 07/10/2013  . Atrial fibrillation with rapid ventricular response ([place]) 02/06/2013  . Family history of coronary artery bypass surgery 02/06/2013  . GERD (gastroesophageal reflux disease) 10/25/2012  . Allergy 10/25/2012  . Hyperlipidemia 10/25/2012  . Depression 10/25/2012  . Anxiety 10/25/2012  . MVP (mitral valve prolapse) 10/25/2012  . DJD (degenerative joint  disease) of cervical spine 10/25/2012  . Nephrolithiasis 10/25/2012  . Colon polyps 10/25/2012     Prior to Admission medications   Medication Sig Start Date End Date Taking? Authorizing Provider  estradiol (ESTRACE) 1 MG tablet Take 1 tablet (1 mg total) by mouth daily. 03/23/17  Yes Mariana Wiederholt, PA-C  flecainide (TAMBOCOR) 100 MG tablet Take 2 tablets by mouth with onset of atrial fib. No more than once a week 01/18/17  Yes Pixie Casino, MD  fluticasone (FLONASE) 50 MCG/ACT nasal spray Place 2 sprays into both nostrils daily. 03/23/17  Yes Zariya Minner, PA-C  ibuprofen (ADVIL,MOTRIN) 200 MG tablet Take 400 mg by mouth every 6 (six) hours as needed for headache.   Yes Historical Provider, MD  metoprolol tartrate (LOPRESSOR) 25 MG tablet TAKE 1/2 TABLET BY MOUTH TWICE DAILY 03/08/17  Yes Pixie Casino, MD  pravastatin (PRAVACHOL) 40 MG tablet TAKE ONE (1) TABLET BY MOUTH EVERY DAY 09/22/16  Yes Hance Caspers, PA-C  ranitidine (ZANTAC) 150 MG tablet Take 150 mg by mouth every evening.    Yes Historical Provider, MD  rivaroxaban (XARELTO) 20 MG TABS tablet Take 1 tablet (20 mg total) by mouth daily with supper. Patient taking differently: Take 20 mg by mouth daily with supper. WILL STOP PRIOR TO PROCEDURE 08/31/15  Yes Isaiah Serge, NP  XARELTO 20 MG TABS tablet TAKE ONE TABLET BY MOUTH ONCE DAILY WITH SUPPER 09/07/16  Yes Pixie Casino, MD     Allergies  Allergen Reactions  . Adhesive [Tape]     Burn skin  . Erythromycin     Gi intolerance  . Naproxen Nausea And Vomiting    Abdominal pain   .  Penicillins Hives    Has patient had a PCN reaction causing immediate rash, facial/tongue/throat swelling, SOB or lightheadedness with hypotension: UNKNOWN Has patient had a PCN reaction causing severe rash involving mucus membranes or skin necrosis: No Has patient had a PCN reaction that required hospitalization No Has patient had a PCN reaction occurring within the last 10 years: No If  all of the above answers are "NO", then may proceed with Cephalosporin use.        Objective:  Physical Exam  Constitutional: She is oriented to person, place, and time. She appears well-developed and well-nourished. She is active and cooperative. No distress.  BP 124/74   Pulse (!) 58   Temp 98.2 F (36.8 C) (Oral)   Resp 16   Ht 5' 4.75" (1.645 m)   Wt 161 lb 9.6 oz (73.3 kg)   SpO2 98%   BMI 27.10 kg/m   HENT:  Head: Normocephalic and atraumatic.  Right Ear: Hearing normal.  Left Ear: Hearing normal.  Eyes: Conjunctivae are normal. No scleral icterus.  Neck: Normal range of motion. Neck supple. No thyromegaly present.  Cardiovascular: Normal rate, regular rhythm and normal heart sounds.   Pulses:      Radial pulses are 2+ on the right side, and 2+ on the left side.  Pulmonary/Chest: Effort normal and breath sounds normal.  Lymphadenopathy:       Head (right side): No tonsillar, no preauricular, no posterior auricular and no occipital adenopathy present.       Head (left side): No tonsillar, no preauricular, no posterior auricular and no occipital adenopathy present.    She has no cervical adenopathy.       Right: No supraclavicular adenopathy present.       Left: No supraclavicular adenopathy present.  Neurological: She is alert and oriented to person, place, and time. No sensory deficit.  Skin: Skin is warm, dry and intact. No rash noted. No cyanosis or erythema. Nails show no clubbing.  Psychiatric: She has a normal mood and affect. Her speech is normal and behavior is normal.      Assessment & Plan:   Problem List Items Addressed This Visit    Hyperlipidemia - Primary (Chronic)    Update lipids today. If LDL >100 consider changing pravastatin to atorvastatin or rosuvastatin.      Relevant Orders   Comprehensive metabolic panel   Lipid panel   Allergic rhinitis    Controlled.      Relevant Medications   fluticasone (FLONASE) 50 MCG/ACT nasal spray   Hormone  replacement therapy    Continue with screening mammography, annual CBE and monthly SBE.      Relevant Medications   estradiol (ESTRACE) 1 MG tablet    Other Visit Diagnoses    Thrombocytopenia ([place])       Noted on CBC last visit. update today.   Relevant Orders   CBC with Differential/Platelet   Elevated hemoglobin A1c       Update today. Continue health yeating and regular exercise.   Relevant Orders   Hemoglobin A1c   Comprehensive metabolic panel       Return in about 6 months (around 09/22/2017) for re-evaluation of cholesterol, etc.   Fara Chute, PA-C [facility] [place]

## 2017-03-23 NOTE — Assessment & Plan Note (Signed)
Update lipids today. If LDL >100 consider changing pravastatin to atorvastatin or rosuvastatin.

## 2017-03-23 NOTE — Patient Instructions (Signed)
     IF you received an x-ray today, you will receive an invoice from [facility]. Please contact  Radiology at [phone] with questions or concerns regarding your invoice.   IF you received labwork today, you will receive an invoice from [facility]. Please contact [facility] at [phone] with questions or concerns regarding your invoice.   Our billing staff will not be able to assist you with questions regarding bills from these companies.  You will be contacted with the lab results as soon as they are available. The fastest way to get your results is to activate your My Chart account. Instructions are located on the last page of this paperwork. If you have not heard from us regarding the results in 2 weeks, please contact this office.     

## 2017-03-23 NOTE — Progress Notes (Deleted)
Patient ID: Daisy Nelson, female    DOB: [birth year], 71 y.o.   MRN: [id number]  PCP: Harrison Mons, PA-C  Chief Complaint  Patient presents with  . Follow-up    6 mos  . Hyperlipidemia    Subjective:   Presents for evaluation of ***.  ***.    Review of Systems     Patient Active Problem List   Diagnosis Date Noted  . Hormone replacement therapy 03/23/2017  . Preretinal fibrosis, right eye 08/25/2016  . Hyperglycemia 03/26/2015  . PAF (paroxysmal atrial fibrillation) ([place]) 07/10/2013  . Atrial fibrillation with rapid ventricular response ([place]) 02/06/2013  . Family history of coronary artery bypass surgery 02/06/2013  . GERD (gastroesophageal reflux disease) 10/25/2012  . Allergy 10/25/2012  . Hyperlipidemia 10/25/2012  . Depression 10/25/2012  . Anxiety 10/25/2012  . MVP (mitral valve prolapse) 10/25/2012  . DJD (degenerative joint disease) of cervical spine 10/25/2012  . Nephrolithiasis 10/25/2012  . Colon polyps 10/25/2012     Prior to Admission medications   Medication Sig Start Date End Date Taking? Authorizing Provider  estradiol (ESTRACE) 1 MG tablet Take 1 tablet (1 mg total) by mouth daily. 03/23/17  Yes Imani Fiebelkorn, PA-C  flecainide (TAMBOCOR) 100 MG tablet Take 2 tablets by mouth with onset of atrial fib. No more than once a week 01/18/17  Yes Pixie Casino, MD  fluticasone (FLONASE) 50 MCG/ACT nasal spray Place 2 sprays into both nostrils daily. 03/23/17  Yes Loella Hickle, PA-C  ibuprofen (ADVIL,MOTRIN) 200 MG tablet Take 400 mg by mouth every 6 (six) hours as needed for headache.   Yes Historical Provider, MD  metoprolol tartrate (LOPRESSOR) 25 MG tablet TAKE 1/2 TABLET BY MOUTH TWICE DAILY 03/08/17  Yes Pixie Casino, MD  pravastatin (PRAVACHOL) 40 MG tablet TAKE ONE (1) TABLET BY MOUTH EVERY DAY 09/22/16  Yes Marisella Puccio, PA-C  ranitidine (ZANTAC) 150 MG tablet Take 150 mg by mouth every evening.    Yes Historical Provider, MD    rivaroxaban (XARELTO) 20 MG TABS tablet Take 1 tablet (20 mg total) by mouth daily with supper. Patient taking differently: Take 20 mg by mouth daily with supper. WILL STOP PRIOR TO PROCEDURE 08/31/15  Yes Isaiah Serge, NP  XARELTO 20 MG TABS tablet TAKE ONE TABLET BY MOUTH ONCE DAILY WITH SUPPER 09/07/16  Yes Pixie Casino, MD     Allergies  Allergen Reactions  . Adhesive [Tape]     Burn skin  . Erythromycin     Gi intolerance  . Naproxen Nausea And Vomiting    Abdominal pain   . Penicillins Hives    Has patient had a PCN reaction causing immediate rash, facial/tongue/throat swelling, SOB or lightheadedness with hypotension: UNKNOWN Has patient had a PCN reaction causing severe rash involving mucus membranes or skin necrosis: No Has patient had a PCN reaction that required hospitalization No Has patient had a PCN reaction occurring within the last 10 years: No If all of the above answers are "NO", then may proceed with Cephalosporin use.        Objective:  Physical Exam  Constitutional: She is oriented to person, place, and time. She appears well-developed and well-nourished. She is active and cooperative. No distress.  BP 124/74   Pulse (!) 58   Temp 98.2 F (36.8 C) (Oral)   Resp 16   Ht 5' 4.75" (1.645 m)   Wt 161 lb 9.6 oz (73.3 kg)   SpO2 98%  BMI 27.10 kg/m   HENT:  Head: Normocephalic and atraumatic.  Right Ear: Hearing normal.  Left Ear: Hearing normal.  Eyes: Conjunctivae are normal. No scleral icterus.  Neck: Normal range of motion. Neck supple. No thyromegaly present.  Cardiovascular: Regular rhythm and normal heart sounds.  Bradycardia present.   Pulses:      Radial pulses are 2+ on the right side, and 2+ on the left side.  Pulmonary/Chest: Effort normal and breath sounds normal.  Lymphadenopathy:       Head (right side): No tonsillar, no preauricular, no posterior auricular and no occipital adenopathy present.       Head (left side): No tonsillar,  no preauricular, no posterior auricular and no occipital adenopathy present.    She has no cervical adenopathy.       Right: No supraclavicular adenopathy present.       Left: No supraclavicular adenopathy present.  Neurological: She is alert and oriented to person, place, and time. No sensory deficit.  Skin: Skin is warm, dry and intact. No rash noted. No cyanosis or erythema. Nails show no clubbing.  Psychiatric: She has a normal mood and affect. Her speech is normal and behavior is normal.           Assessment & Plan:   ***

## 2017-03-23 NOTE — Progress Notes (Signed)
THIS NOTE IS USED FOR EDUCATIONAL PURPOSES ONLY!!!   Name: Daisy Nelson  DOB: 1946/09/19  Age: 71 y.o. Sex: female  CC:  Chief Complaint  Patient presents with  . Follow-up    6 mos  . Hyperlipidemia    PCP: [facility] Mons, PA-C  HPI: Patient is here for f/u of hyperlipidemia.   Patient reports she is doing well. She denies medication adverse effects. She reports that she has cut out fast foods since retiring. She reports that most of her exercise is from doing chores around the house.   Patient contines to deny stress, anxious, or depressed feelings.   Patient reports getting her colonoscopy and was found to have polyps and has a f/u of three years for another colonoscopy.   ROS:  Constitutional: Negative for activity change, appetite change, fatigue and unexpected weight change.  HENT: Negative for congestion, dental problem, ear pain, hearing loss, mouth sores, postnasal drip, rhinorrhea, sneezing, sore throat, tinnitus and trouble swallowing.   Eyes: Negative for photophobia, pain, redness and visual disturbance.  Respiratory: Negative for cough, chest tightness and shortness of breath.   Cardiovascular: Negative for chest pain, palpitations and leg swelling.  Gastrointestinal: Negative for abdominal pain, blood in stool, constipation, diarrhea, nausea and vomiting.  Genitourinary: Negative for dysuria, frequency, hematuria and urgency.  Musculoskeletal: Negative for arthralgias, gait problem, myalgias and neck stiffness.  Skin: Negative for rash.  Neurological: Negative for dizziness, speech difficulty, weakness, light-headedness, numbness and headaches.  Hematological: Negative for adenopathy.  Psychiatric/Behavioral: Negative for confusion and sleep disturbance. The patient is not nervous/anxious.    PMH:  Patient Active Problem List   Diagnosis Date Noted  . Preretinal fibrosis, right eye 08/25/2016  . Hyperglycemia 03/26/2015  . PAF (paroxysmal atrial  fibrillation) ([facility]) 07/10/2013  . Atrial fibrillation with rapid ventricular response ([place]) 02/06/2013  . Family history of coronary artery bypass surgery 02/06/2013  . GERD (gastroesophageal reflux disease) 10/25/2012  . Allergy 10/25/2012  . Hyperlipidemia 10/25/2012  . Depression 10/25/2012  . Anxiety 10/25/2012  . MVP (mitral valve prolapse) 10/25/2012  . DJD (degenerative joint disease) of cervical spine 10/25/2012  . Nephrolithiasis 10/25/2012  . Colon polyps 10/25/2012    Allergies:  Allergies  Allergen Reactions  . Adhesive [Tape]     Burn skin  . Erythromycin     Gi intolerance  . Naproxen Nausea And Vomiting    Abdominal pain   . Penicillins Hives    Has patient had a PCN reaction causing immediate rash, facial/tongue/throat swelling, SOB or lightheadedness with hypotension: UNKNOWN Has patient had a PCN reaction causing severe rash involving mucus membranes or skin necrosis: No Has patient had a PCN reaction that required hospitalization No Has patient had a PCN reaction occurring within the last 10 years: No If all of the above answers are "NO", then may proceed with Cephalosporin use.     Medications:  Current Outpatient Prescriptions on File Prior to Visit  Medication Sig Dispense Refill  . estradiol (ESTRACE) 1 MG tablet TAKE ONE TABLET BY MOUTH ONCE DAILY 30 tablet 0  . flecainide (TAMBOCOR) 100 MG tablet Take 2 tablets by mouth with onset of atrial fib. No more than once a week 6 tablet 5  . fluticasone (FLONASE) 50 MCG/ACT nasal spray Place 2 sprays into both nostrils daily. 16 g 12  . ibuprofen (ADVIL,MOTRIN) 200 MG tablet Take 400 mg by mouth every 6 (six) hours as needed for headache.    . metoprolol tartrate (LOPRESSOR) 25  MG tablet TAKE 1/2 TABLET BY MOUTH TWICE DAILY 60 tablet 1  . pravastatin (PRAVACHOL) 40 MG tablet TAKE ONE (1) TABLET BY MOUTH EVERY DAY 90 tablet 3  . ranitidine (ZANTAC) 150 MG tablet Take 150 mg by mouth every evening.     .  rivaroxaban (XARELTO) 20 MG TABS tablet Take 1 tablet (20 mg total) by mouth daily with supper. (Patient taking differently: Take 20 mg by mouth daily with supper. WILL STOP PRIOR TO PROCEDURE) 30 tablet 0  . XARELTO 20 MG TABS tablet TAKE ONE TABLET BY MOUTH ONCE DAILY WITH SUPPER 30 tablet 6   No current facility-administered medications on file prior to visit.     PE:  GS: WDWN female sitting on exam table in NAD.  Vitals: BP 124/74   Pulse (!) 58   Temp 98.2 F (36.8 C) (Oral)   Resp 16   Ht 5' 4.75" (1.645 m)   Wt 161 lb 9.6 oz (73.3 kg)   SpO2 98%   BMI 27.10 kg/m  HEENT: Normocephalic, atruamatic. PEARRL. No cervical lymphadenopathy. No thyroid nodules, normal size, and equal bilaterally.  Cardiovascular: RRR. No S3 or S4. No murmurs, rubs, or gallops. Pulses 2+ and equal bilateral in the upper and lower extremities. No pitting edema. No varicosities, clubbing, or cyanosis.  Pulm: CTA bilaterally. No expiratory muscle use while breathing.  GI: +BS. NTND. No rigidity or guarding. No rebound tenderness.  Neuro: CN 2-12 grossly intact.  Psych: A&O x 4. Mood and affect appropriate for situation.  Skin: Warm and dry. No rashes or excoriations on exposed skin.   A&P:  1. Hyperlipidemia, unspecified hyperlipidemia type - controlled on current medications. Reevaluate today.  Plan: Comprehensive metabolic panel, Lipid panel  2. Thrombocytopenia ([place]) - asymptomatic. Reevaluate today.  Plan: CBC with Differential/Platelet  3. Elevated hemoglobin A1c - Asymptomatic. Reevaluate today.  Plan: Hemoglobin A1c, Comprehensive metabolic panel  4. Allergy, subsequent encounter - controlled on current medications. Reevaluate today. Plan: fluticasone (FLONASE) 50 MCG/ACT nasal spray  5. Hormone replacement therapy - controlled on current medications. Reevaluate today. Plan: estradiol (ESTRACE) 1 MG tablet       Respectfully,  Delilah Shan, PA-S2

## 2017-03-23 NOTE — Assessment & Plan Note (Signed)
Controlled.  

## 2017-03-23 NOTE — Assessment & Plan Note (Signed)
Continue with screening mammography, annual CBE and monthly SBE.

## 2017-03-24 ENCOUNTER — Encounter: Payer: Self-pay | Admitting: Physician Assistant

## 2017-03-24 LAB — COMPREHENSIVE METABOLIC PANEL
ALT: 14 IU/L (ref 0–32)
AST: 16 IU/L (ref 0–40)
Albumin/Globulin Ratio: 1.4 (ref 1.2–2.2)
Albumin: 3.9 g/dL (ref 3.6–4.8)
Alkaline Phosphatase: 83 IU/L (ref 39–117)
BUN/Creatinine Ratio: 20 (ref 12–28)
BUN: 14 mg/dL (ref 8–27)
Bilirubin Total: 0.4 mg/dL (ref 0.0–1.2)
CO2: 24 mmol/L (ref 18–29)
Calcium: 9.3 mg/dL (ref 8.7–10.3)
Chloride: 99 mmol/L (ref 96–106)
Creatinine, Ser: 0.71 mg/dL (ref 0.57–1.00)
GFR calc Af Amer: 100 mL/min/{1.73_m2} (ref >59)
GFR calc non Af Amer: 87 mL/min/{1.73_m2} (ref >59)
Globulin, Total: 2.7 g/dL (ref 1.5–4.5)
Glucose: 113 mg/dL — ABNORMAL HIGH (ref 65–99)
Potassium: 4.3 mmol/L (ref 3.5–5.2)
Sodium: 139 mmol/L (ref 134–144)
Total Protein: 6.6 g/dL (ref 6.0–8.5)

## 2017-03-24 LAB — CBC WITH DIFFERENTIAL/PLATELET
Basophils Absolute: 0 10*3/uL (ref 0.0–0.2)
Basos: 1 %
EOS (ABSOLUTE): 0.1 10*3/uL (ref 0.0–0.4)
Eos: 3 %
Hematocrit: 39.5 % (ref 34.0–46.6)
Hemoglobin: 13.3 g/dL (ref 11.1–15.9)
Immature Grans (Abs): 0 10*3/uL (ref 0.0–0.1)
Immature Granulocytes: 0 %
Lymphocytes Absolute: 1.2 10*3/uL (ref 0.7–3.1)
Lymphs: 28 %
MCH: 29.9 pg (ref 26.6–33.0)
MCHC: 33.7 g/dL (ref 31.5–35.7)
MCV: 89 fL (ref 79–97)
Monocytes Absolute: 0.6 10*3/uL (ref 0.1–0.9)
Monocytes: 14 %
Neutrophils Absolute: 2.4 10*3/uL (ref 1.4–7.0)
Neutrophils: 54 %
Platelets: 185 10*3/uL (ref 150–379)
RBC: 4.45 x10E6/uL (ref 3.77–5.28)
RDW: 14.3 % (ref 12.3–15.4)
WBC: 4.3 10*3/uL (ref 3.4–10.8)

## 2017-03-24 LAB — LIPID PANEL
Chol/HDL Ratio: 3.6 ratio (ref 0.0–4.4)
Cholesterol, Total: 175 mg/dL (ref 100–199)
HDL: 49 mg/dL (ref >39)
LDL Calculated: 81 mg/dL (ref 0–99)
Triglycerides: 223 mg/dL — ABNORMAL HIGH (ref 0–149)
VLDL Cholesterol Cal: 45 mg/dL — ABNORMAL HIGH (ref 5–40)

## 2017-03-24 LAB — HEMOGLOBIN A1C
Est. average glucose Bld gHb Est-mCnc: 128 mg/dL
Hgb A1c MFr Bld: 6.1 % — ABNORMAL HIGH (ref 4.8–5.6)

## 2017-03-25 ENCOUNTER — Encounter: Payer: Self-pay | Admitting: Physician Assistant

## 2017-03-25 ENCOUNTER — Other Ambulatory Visit: Payer: Self-pay | Admitting: Physician Assistant

## 2017-03-25 DIAGNOSIS — E785 Hyperlipidemia, unspecified: Secondary | ICD-10-CM

## 2017-03-25 MED ORDER — ATORVASTATIN CALCIUM 40 MG PO TABS: 40.0000 mg | ORAL_TABLET | Freq: Every day | ORAL | 3 refills | Status: DC

## 2017-03-25 NOTE — Progress Notes (Signed)
Changed from pravastatin to atorvastatin due to LDL not to goal of <70.

## 2017-04-07 ENCOUNTER — Encounter: Payer: Self-pay | Admitting: Internal Medicine

## 2017-04-08 ENCOUNTER — Other Ambulatory Visit: Payer: Self-pay | Admitting: Internal Medicine

## 2017-04-08 NOTE — Telephone Encounter (Signed)
Rx request sent to pharmacy.  

## 2017-05-18 ENCOUNTER — Telehealth: Payer: Self-pay | Admitting: Internal Medicine

## 2017-05-18 NOTE — Telephone Encounter (Signed)
Patient calling, states that she is having an AFIB episode and her heart rate was 87. Patient would like to verify that its okay to take flecainide medication with her heart rate at 87? Please call to discuss, thanks.

## 2017-05-18 NOTE — Telephone Encounter (Signed)
Pt of Dr. Debara Pickett Seen yearly at routine.  Hx of A Fib  Spoke to patient. Notes she's having HR of about 87 when checked on BP cuff, feels like she is in an A Fib episode. She has very occasional A Fib episodes, has "pill in pocket" flecainide at home, which she's never had to use.  BP was about 96/82 this AM.  She denies chest pain, shortness of breath, lightheadedness, or dizziness.   Discussed w Dr. Debara Pickett, he advised OK for flecainide, also offered patient the option of coming in for EKG for rhythm confirmation.  Followed up with patient regarding physician recommendations. She voiced she would try flecainide first, if she continues to have issues she will call us to get further advice. Pt expressed thanks for call & understanding of instructions.

## 2017-06-01 ENCOUNTER — Other Ambulatory Visit: Payer: Self-pay | Admitting: Physician Assistant

## 2017-06-01 DIAGNOSIS — Z1231 Encounter for screening mammogram for malignant neoplasm of breast: Secondary | ICD-10-CM

## 2017-07-01 ENCOUNTER — Other Ambulatory Visit (HOSPITAL_COMMUNITY): Payer: Self-pay | Admitting: Internal Medicine

## 2017-07-12 ENCOUNTER — Ambulatory Visit
Admission: RE | Admit: 2017-07-12 | Discharge: 2017-07-12 | Disposition: A | Discharge status: Home / Self Care | Payer: PPO | Source: Ambulatory Visit | Attending: Physician Assistant | Admitting: Physician Assistant

## 2017-07-12 DIAGNOSIS — Z1231 Encounter for screening mammogram for malignant neoplasm of breast: Secondary | ICD-10-CM | POA: Diagnosis not present

## 2017-08-30 ENCOUNTER — Ambulatory Visit (INDEPENDENT_AMBULATORY_CARE_PROVIDER_SITE_OTHER): Payer: PPO | Admitting: Family Medicine

## 2017-08-30 DIAGNOSIS — Z23 Encounter for immunization: Secondary | ICD-10-CM | POA: Diagnosis not present

## 2017-08-31 ENCOUNTER — Other Ambulatory Visit (HOSPITAL_COMMUNITY): Payer: Self-pay | Admitting: Internal Medicine

## 2017-09-01 NOTE — Progress Notes (Signed)
Flu shot only

## 2017-09-08 ENCOUNTER — Encounter: Payer: Self-pay | Admitting: Physician Assistant

## 2017-09-08 ENCOUNTER — Telehealth: Payer: Self-pay | Admitting: Physician Assistant

## 2017-09-08 ENCOUNTER — Ambulatory Visit (INDEPENDENT_AMBULATORY_CARE_PROVIDER_SITE_OTHER): Payer: PPO | Admitting: Physician Assistant

## 2017-09-08 VITALS — BP 90/60 | HR 64 | Temp 97.7°F | Resp 18 | Ht 64.75 in | Wt 160.6 lb

## 2017-09-08 DIAGNOSIS — R300 Dysuria: Secondary | ICD-10-CM | POA: Diagnosis not present

## 2017-09-08 LAB — POCT URINALYSIS DIP (MANUAL ENTRY)
Bilirubin, UA: NEGATIVE
Glucose, UA: NEGATIVE mg/dL
Ketones, POC UA: NEGATIVE mg/dL
Nitrite, UA: NEGATIVE
Protein Ur, POC: NEGATIVE mg/dL
Spec Grav, UA: <=1.005 — AB (ref 1.010–1.025)
Urobilinogen, UA: 0.2 E.U./dL
pH, UA: 6 (ref 5.0–8.0)

## 2017-09-08 MED ORDER — SULFAMETHOXAZOLE-TRIMETHOPRIM 800-160 MG PO TABS: 1.0000 | ORAL_TABLET | Freq: Two times a day (BID) | ORAL | 0 refills | Status: DC

## 2017-09-08 NOTE — Progress Notes (Signed)
Patient ID: Daisy Nelson, female    DOB: 06/08/1946, 71 y.o.   MRN: [id number]  PCP: [facility] Mons, PA-C  Chief Complaint  Patient presents with  . Dysuria    pt states symptooms started early this morning, pt states it burns to pee and has lower abdominal pressure, reports some bleeding, and it hurts to sit.    Subjective:   Presents for evaluation of urinary urgency, frequency, burning and hematuria since 2 am today.  No fever, chills, nausea, vomiting, back pain. She's exhausted, unable to sleep since onset of symptoms. No dizziness, palpitations.   Review of Systems As above.    Patient Active Problem List   Diagnosis Date Noted  . Hormone replacement therapy 03/23/2017  . Preretinal fibrosis, right eye 08/25/2016  . Hyperglycemia 03/26/2015  . PAF (paroxysmal atrial fibrillation) ([place]) 07/10/2013  . Atrial fibrillation with rapid ventricular response ([place]) 02/06/2013  . Family history of coronary artery bypass surgery 02/06/2013  . GERD (gastroesophageal reflux disease) 10/25/2012  . Allergic rhinitis 10/25/2012  . Hyperlipidemia 10/25/2012  . Depression 10/25/2012  . Anxiety 10/25/2012  . MVP (mitral valve prolapse) 10/25/2012  . DJD (degenerative joint disease) of cervical spine 10/25/2012  . Nephrolithiasis 10/25/2012  . Colon polyps 10/25/2012     Prior to Admission medications   Medication Sig Start Date End Date Taking? Authorizing Provider  atorvastatin (LIPITOR) 40 MG tablet Take 1 tablet (40 mg total) by mouth daily. 03/25/17  Yes Brigg Cape, PA-C  estradiol (ESTRACE) 1 MG tablet Take 1 tablet (1 mg total) by mouth daily. 03/23/17  Yes Josslynn Mentzer, PA-C  flecainide (TAMBOCOR) 100 MG tablet Take 2 tablets by mouth with onset of atrial fib. No more than once a week 01/18/17  Yes Hilty, Nadean Corwin, MD  fluticasone (FLONASE) 50 MCG/ACT nasal spray Place 2 sprays into both nostrils daily. 03/23/17  Yes Jomarion Mish, PA-C  metoprolol  tartrate (LOPRESSOR) 25 MG tablet TAKE 1/2 TABLET BY MOUTH TWICE DAILY 08/31/17  Yes Hilty, Nadean Corwin, MD  ranitidine (ZANTAC) 150 MG tablet Take 150 mg by mouth every evening.    Yes [provider]  rivaroxaban (XARELTO) 20 MG TABS tablet Take 1 tablet (20 mg total) by mouth daily with supper. Patient taking differently: Take 20 mg by mouth daily with supper. WILL STOP PRIOR TO PROCEDURE 08/31/15  Yes Isaiah Serge, NP  ibuprofen (ADVIL,MOTRIN) 200 MG tablet Take 400 mg by mouth every 6 (six) hours as needed for headache.    [provider]     Allergies  Allergen Reactions  . Adhesive [Tape]     Burn skin  . Erythromycin     Gi intolerance  . Naproxen Nausea And Vomiting    Abdominal pain   . Penicillins Hives    Has patient had a PCN reaction causing immediate rash, facial/tongue/throat swelling, SOB or lightheadedness with hypotension: UNKNOWN Has patient had a PCN reaction causing severe rash involving mucus membranes or skin necrosis: No Has patient had a PCN reaction that required hospitalization No Has patient had a PCN reaction occurring within the last 10 years: No If all of the above answers are "NO", then may proceed with Cephalosporin use.        Objective:  Physical Exam  Constitutional: She is oriented to person, place, and time. Vital signs are normal. She appears well-developed and well-nourished. No distress.  HENT:  Head: Normocephalic and atraumatic.  Cardiovascular: Normal rate, regular rhythm and normal heart  sounds.   Pulmonary/Chest: Effort normal and breath sounds normal.  Abdominal: Soft. Normal appearance and bowel sounds are normal. She exhibits no distension and no mass. There is no hepatosplenomegaly. There is tenderness in the suprapubic area. There is no rigidity, no rebound, no guarding, no CVA tenderness, no tenderness at McBurney's point and negative Murphy's sign. No hernia.  Musculoskeletal: Normal range of motion.        Lumbar back: Normal.  Neurological: She is alert and oriented to person, place, and time.  Skin: Skin is warm and dry. No rash noted. She is not diaphoretic. No pallor.  Psychiatric: She has a normal mood and affect. Her speech is normal and behavior is normal. Judgment normal.    Results for orders placed or performed in visit on 09/08/17  POCT urinalysis dipstick  Result Value Ref Range   Color, UA yellow yellow   Clarity, UA clear clear   Glucose, UA negative negative mg/dL   Bilirubin, UA negative negative   Ketones, POC UA negative negative mg/dL   Spec Grav, UA <=1.005 (A) 1.010 - 1.025   Blood, UA moderate (A) negative   pH, UA 6.0 5.0 - 8.0   Protein Ur, POC negative negative mg/dL   Urobilinogen, UA 0.2 0.2 or 1.0 E.U./dL   Nitrite, UA Negative Negative   Leukocytes, UA Small (1+) (A) Negative          Assessment & Plan:   1. Dysuria Supportive care.  Anticipatory guidance.  RTC if symptoms worsen/persist. - POCT urinalysis dipstick - sulfamethoxazole-trimethoprim (BACTRIM DS,SEPTRA DS) 800-160 MG tablet; Take 1 tablet by mouth 2 (two) times daily.  Dispense: 10 tablet; Refill: 0 - Urine Culture    Return if symptoms worsen or fail to improve.   Fara Chute, PA-C [facility]

## 2017-09-08 NOTE — Telephone Encounter (Signed)
Spoke with Pt and she made a appointment

## 2017-09-08 NOTE — Patient Instructions (Signed)
     IF you received an x-ray today, you will receive an invoice from [facility]. Please contact [facility] at [phone] with questions or concerns regarding your invoice.   IF you received labwork today, you will receive an invoice from [facility]. Please contact [facility] at [phone] with questions or concerns regarding your invoice.   Our billing staff will not be able to assist you with questions regarding bills from these companies.  You will be contacted with the lab results as soon as they are available. The fastest way to get your results is to activate your My Chart account. Instructions are located on the last page of this paperwork. If you have not heard from us regarding the results in 2 weeks, please contact this office.     

## 2017-09-08 NOTE — Telephone Encounter (Signed)
Patient called in stating she thinks she has a UTI and wants to know if she can take the over the counter AZO or if [facility] wants her to come in to be seen.  Her call back number is [phone]

## 2017-09-10 LAB — URINE CULTURE

## 2017-09-20 ENCOUNTER — Ambulatory Visit (INDEPENDENT_AMBULATORY_CARE_PROVIDER_SITE_OTHER): Payer: PPO

## 2017-09-20 VITALS — BP 102/68 | HR 58 | Ht 65.0 in | Wt 162.4 lb

## 2017-09-20 DIAGNOSIS — Z0000 Encounter for general adult medical examination without abnormal findings: Secondary | ICD-10-CM | POA: Diagnosis not present

## 2017-09-20 NOTE — Patient Instructions (Addendum)
Daisy Nelson , Thank you for taking time to come for your Medicare Wellness Visit. I appreciate your ongoing commitment to your health goals. Please review the following plan we discussed and let me know if I can assist you in the future.   Screening recommendations/referrals: Colonoscopy: up to date, next due 09/24/2026 Mammogram: up to date, next due 07/13/2019 Bone Density: up to date, next due 06/13/2018 Recommended yearly ophthalmology/optometry visit for glaucoma screening and checkup Recommended yearly dental visit for hygiene and checkup  Vaccinations: Influenza vaccine: up to date Pneumococcal vaccine: up to date Tdap vaccine: up to date, next due 08/17/2018 Shingles vaccine: up to date    Advanced directives: Advance directive discussed with you today. Even though you declined this today please call our office should you change your mind and we can give you the proper paperwork for you to fill out.   Conditions/risks identified: Continue decreasing your sodium intake due to your recent A-fib diagnose.   Next appointment: 09/28/17 @ 8 am with [place] 65 Years and Older, Female Preventive care refers to lifestyle choices and visits with your health care provider that can promote health and wellness. What does preventive care include?  A yearly physical exam. This is also called an annual well check.  Dental exams once or twice a year.  Routine eye exams. Ask your health care provider how often you should have your eyes checked.  Personal lifestyle choices, including:  Daily care of your teeth and gums.  Regular physical activity.  Eating a healthy diet.  Avoiding tobacco and drug use.  Limiting alcohol use.  Practicing safe sex.  Taking low-dose aspirin every day.  Taking vitamin and mineral supplements as recommended by your health care provider. What happens during an annual well check? The services and screenings done by your  health care provider during your annual well check will depend on your age, overall health, lifestyle risk factors, and family history of disease. Counseling  Your health care provider may ask you questions about your:  Alcohol use.  Tobacco use.  Drug use.  Emotional well-being.  Home and relationship well-being.  Sexual activity.  Eating habits.  History of falls.  Memory and ability to understand (cognition).  Work and work Statistician.  Reproductive health. Screening  You may have the following tests or measurements:  Height, weight, and BMI.  Blood pressure.  Lipid and cholesterol levels. These may be checked every 5 years, or more frequently if you are over 24 years old.  Skin check.  Lung cancer screening. You may have this screening every year starting at age 46 if you have a 30-pack-year history of smoking and currently smoke or have quit within the past 15 years.  Fecal occult blood test (FOBT) of the stool. You may have this test every year starting at age 6.  Flexible sigmoidoscopy or colonoscopy. You may have a sigmoidoscopy every 5 years or a colonoscopy every 10 years starting at age 89.  Hepatitis C blood test.  Hepatitis B blood test.  Sexually transmitted disease (STD) testing.  Diabetes screening. This is done by checking your blood sugar (glucose) after you have not eaten for a while (fasting). You may have this done every 1-3 years.  Bone density scan. This is done to screen for osteoporosis. You may have this done starting at age 64.  Mammogram. This may be done every 1-2 years. Talk to your health care provider about how often you should have  regular mammograms. Talk with your health care provider about your test results, treatment options, and if necessary, the need for more tests. Vaccines  Your health care provider may recommend certain vaccines, such as:  Influenza vaccine. This is recommended every year.  Tetanus, diphtheria, and  acellular pertussis (Tdap, Td) vaccine. You may need a Td booster every 10 years.  Zoster vaccine. You may need this after age 75.  Pneumococcal 13-valent conjugate (PCV13) vaccine. One dose is recommended after age 12.  Pneumococcal polysaccharide (PPSV23) vaccine. One dose is recommended after age 50. Talk to your health care provider about which screenings and vaccines you need and how often you need them. This information is not intended to replace advice given to you by your health care provider. Make sure you discuss any questions you have with your health care provider. Document Released: 12/13/2015 Document Revised: 08/05/2016 Document Reviewed: 09/17/2015 Elsevier Interactive Patient Education  2017 [place] Prevention in the Home Falls can cause injuries. They can happen to people of all ages. There are many things you can do to make your home safe and to help prevent falls. What can I do on the outside of my home?  Regularly fix the edges of walkways and driveways and fix any cracks.  Remove anything that might make you trip as you walk through a door, such as a raised step or threshold.  Trim any bushes or trees on the path to your home.  Use bright outdoor lighting.  Clear any walking paths of anything that might make someone trip, such as rocks or tools.  Regularly check to see if handrails are loose or broken. Make sure that both sides of any steps have handrails.  Any raised decks and porches should have guardrails on the edges.  Have any leaves, snow, or ice cleared regularly.  Use sand or salt on walking paths during winter.  Clean up any spills in your garage right away. This includes oil or grease spills. What can I do in the bathroom?  Use night lights.  Install grab bars by the toilet and in the tub and shower. Do not use towel bars as grab bars.  Use non-skid mats or decals in the tub or shower.  If you need to sit down in the shower, use  a plastic, non-slip stool.  Keep the floor dry. Clean up any water that spills on the floor as soon as it happens.  Remove soap buildup in the tub or shower regularly.  Attach bath mats securely with double-sided non-slip rug tape.  Do not have throw rugs and other things on the floor that can make you trip. What can I do in the bedroom?  Use night lights.  Make sure that you have a light by your bed that is easy to reach.  Do not use any sheets or blankets that are too big for your bed. They should not hang down onto the floor.  Have a firm chair that has side arms. You can use this for support while you get dressed.  Do not have throw rugs and other things on the floor that can make you trip. What can I do in the kitchen?  Clean up any spills right away.  Avoid walking on wet floors.  Keep items that you use a lot in easy-to-reach places.  If you need to reach something above you, use a strong step stool that has a grab bar.  Keep electrical cords out of the  way.  Do not use floor polish or wax that makes floors slippery. If you must use wax, use non-skid floor wax.  Do not have throw rugs and other things on the floor that can make you trip. What can I do with my stairs?  Do not leave any items on the stairs.  Make sure that there are handrails on both sides of the stairs and use them. Fix handrails that are broken or loose. Make sure that handrails are as long as the stairways.  Check any carpeting to make sure that it is firmly attached to the stairs. Fix any carpet that is loose or worn.  Avoid having throw rugs at the top or bottom of the stairs. If you do have throw rugs, attach them to the floor with carpet tape.  Make sure that you have a light switch at the top of the stairs and the bottom of the stairs. If you do not have them, ask someone to add them for you. What else can I do to help prevent falls?  Wear shoes that:  Do not have high heels.  Have  rubber bottoms.  Are comfortable and fit you well.  Are closed at the toe. Do not wear sandals.  If you use a stepladder:  Make sure that it is fully opened. Do not climb a closed stepladder.  Make sure that both sides of the stepladder are locked into place.  Ask someone to hold it for you, if possible.  Clearly mark and make sure that you can see:  Any grab bars or handrails.  First and last steps.  Where the edge of each step is.  Use tools that help you move around (mobility aids) if they are needed. These include:  Canes.  Walkers.  Scooters.  Crutches.  Turn on the lights when you go into a dark area. Replace any light bulbs as soon as they burn out.  Set up your furniture so you have a clear path. Avoid moving your furniture around.  If any of your floors are uneven, fix them.  If there are any pets around you, be aware of where they are.  Review your medicines with your doctor. Some medicines can make you feel dizzy. This can increase your chance of falling. Ask your doctor what other things that you can do to help prevent falls. This information is not intended to replace advice given to you by your health care provider. Make sure you discuss any questions you have with your health care provider. Document Released: 09/12/2009 Document Revised: 04/23/2016 Document Reviewed: 12/21/2014 Elsevier Interactive Patient Education  2017 [employer].

## 2017-09-20 NOTE — Progress Notes (Signed)
Subjective:   Daisy Nelson is a 71 y.o. female who presents for Medicare Annual (Subsequent) preventive examination.  Review of Systems:  N/A Cardiac Risk Factors include: advanced age (>48men, >7 women);dyslipidemia     Objective:     Vitals: BP 102/68   Pulse (!) 58   Ht 5\' 5"  (1.651 m)   Wt 162 lb 6 oz (73.7 kg)   SpO2 96%   BMI 27.02 kg/m   Body mass index is 27.02 kg/m.   Tobacco History  Smoking Status  . Former Smoker  . Packs/day: 0.50  . Years: 36.00  . Types: Cigarettes  . Start date: 03/15/1963  . Quit date: 01/08/2005  Smokeless Tobacco  . Never Used     Counseling given: Not Answered   Past Medical History:  Diagnosis Date  . AF (atrial fibrillation) ([place])    AF with RVR 01/2013  . Allergy   . Anxiety   . Atrial fibrillation with RVR ([place]) 08/31/2015  . Colon polyps   . Complication of anesthesia 2011   colonscopy- "slow to awaken"  . Depression   . DJD (degenerative joint disease) of cervical spine   . Family history of CABG   . GERD (gastroesophageal reflux disease)   . History of kidney stones   . Hyperglycemia   . Hyperlipidemia   . Jaw pain 08/31/2015  . Pneumonia   . PONV (postoperative nausea and vomiting)    Past Surgical History:  Procedure Laterality Date  . [place] VITRECTOMY WITH 20 GAUGE MVR PORT FOR MACULAR HOLE Right 08/25/2016   Procedure: 25 GAUGE PARS PLANA VITRECTOMY; RIGHT EYE;  Surgeon: [name], MD;  Location: [facility];  Service: Ophthalmology;  Laterality: Right;  . ABDOMINAL HYSTERECTOMY  1991  . CATARACT EXTRACTION W/ INTRAOCULAR LENS  IMPLANT, BILATERAL Bilateral 03/2013  . CESAREAN SECTION  1976  . COLONOSCOPY  2011   polyps  . EYE SURGERY    . GANGLION CYST EXCISION Left    hand  . GAS/FLUID EXCHANGE Right 08/25/2016   Procedure: GAS/FLUID EXCHANGE RIGHT EYE;  Surgeon: [name], MD;  Location: [facility];  Service: Ophthalmology;  Laterality: Right;  . IRIDECTOMY Right 08/25/2016   Procedure: PERIPHERAL IRIDECTOMY RIGHT EYE;  Surgeon: [name], MD;  Location: [facility];  Service: Ophthalmology;  Laterality: Right;  . LAPAROSCOPY  1976  . MEMBRANE PEEL Right 08/25/2016   Procedure: MEMBRANE PEEL RIGHT EYE;  Surgeon: [name], MD;  Location: [facility];  Service: Ophthalmology;  Laterality: Right;  . NM MYOCAR PERF WALL MOTION  01/2013   lexiscan - no pharmacologically induced ischemia, fixed defects in apical segments of anterior septum & inferior lateral wall   . PARS PLANA VITRECTOMY Right 08/25/2016   laser, gas injection, AC wash out. Membrane peel right eye  . TRANSTHORACIC ECHOCARDIOGRAM  01/2013   EF 55-60%, mod conc LVH; mild MR with mildly thickened leaflets; mild TR; PA peak pressure 76mmHg  . TUBAL LIGATION  1977   Family History  Problem Relation Age of Onset  . Stroke Mother   . Diabetes Father   . Heart disease Father        CABG  . Arthritis Father        C6-7  . Skin cancer Father        SCC  . Arthritis Brother        s/p THR   History  Sexual Activity  . Sexual activity: Yes  .  Partners: Male  . Birth control/ protection: Surgical    Outpatient Encounter Prescriptions as of 09/20/2017  Medication Sig  . atorvastatin (LIPITOR) 40 MG tablet Take 1 tablet (40 mg total) by mouth daily.  Marland Kitchen estradiol (ESTRACE) 1 MG tablet Take 1 tablet (1 mg total) by mouth daily.  . flecainide (TAMBOCOR) 100 MG tablet Take 2 tablets by mouth with onset of atrial fib. No more than once a week  . fluticasone (FLONASE) 50 MCG/ACT nasal spray Place 2 sprays into both nostrils daily.  Marland Kitchen ibuprofen (ADVIL,MOTRIN) 200 MG tablet Take 400 mg by mouth every 6 (six) hours as needed for headache.  . metoprolol tartrate (LOPRESSOR) 25 MG tablet TAKE 1/2 TABLET BY MOUTH TWICE DAILY  . ranitidine (ZANTAC) 150 MG tablet Take 150 mg by mouth every evening.   . rivaroxaban (XARELTO) 20 MG TABS tablet Take 1 tablet (20 mg total) by mouth daily with supper. (Patient taking  differently: Take 20 mg by mouth daily with supper. WILL STOP PRIOR TO PROCEDURE)  . [DISCONTINUED] sulfamethoxazole-trimethoprim (BACTRIM DS,SEPTRA DS) 800-160 MG tablet Take 1 tablet by mouth 2 (two) times daily.   No facility-administered encounter medications on file as of 09/20/2017.     Activities of Daily Living In your present state of health, do you have any difficulty performing the following activities: 09/20/2017  Hearing? N  Vision? N  Difficulty concentrating or making decisions? N  Walking or climbing stairs? N  Dressing or bathing? N  Doing errands, shopping? N  Preparing Food and eating ? N  Using the Toilet? N  In the past six months, have you accidently leaked urine? Y  Comment Slight stress incontinence   Do you have problems with loss of bowel control? N  Managing your Medications? N  Managing your Finances? N  Housekeeping or managing your Housekeeping? N  Some recent data might be hidden    Patient Care Team: Harrison Mons, PA-C as PCP - General (Physician Assistant) Pixie Casino, MD as Consulting Physician (Cardiology) Vevelyn Royals, MD as Consulting Physician (Ophthalmology) [name], MD as Consulting Physician (Ophthalmology) Carol Ada, MD as Consulting Physician (Gastroenterology)    Assessment:     Exercise Activities and Dietary recommendations Current Exercise Habits: The patient does not participate in regular exercise at present, Exercise limited by: None identified  Goals    . Reduce sodium intake          Patient is currently decreasing her sodium intake due to her recent A-fib diagnose.       Fall Risk Fall Risk  09/20/2017 09/08/2017 03/23/2017 01/06/2017 09/22/2016  Falls in the past year? No No No No No   Depression Screen PHQ 2/9 Scores 09/20/2017 09/08/2017 03/23/2017 01/06/2017  PHQ - 2 Score 0 0 0 0  PHQ- 9 Score 1 - - -     Cognitive Function     6CIT Screen 09/20/2017  What Year? 0 points  What  month? 0 points  What time? 0 points  Count back from 20 0 points  Months in reverse 0 points  Repeat phrase 2 points  Total Score 2    Immunization History  Administered Date(s) Administered  . Influenza Split 08/24/2011  . Influenza, Seasonal, Injecte, Preservative Fre 11/15/2012  . Influenza,inj,Quad PF,6+ Mos 08/03/2013, 09/28/2014, 09/24/2015, 09/22/2016, 08/30/2017  . Pneumococcal Conjugate-13 09/28/2014  . Pneumococcal Polysaccharide-23 08/03/2013  . Tdap 08/17/2008  . Zoster 11/27/2013   Screening Tests Health Maintenance  Topic Date Due  . TETANUS/TDAP  08/17/2018  . MAMMOGRAM  07/13/2019  . COLONOSCOPY  09/24/2021  . INFLUENZA VACCINE  Completed  . DEXA SCAN  Completed  . Hepatitis C Screening  Completed  . PNA vac Low Risk Adult  Completed      Plan:   I have personally reviewed and noted the following in the patient's chart:   . Medical and social history . Use of alcohol, tobacco or illicit drugs  . Current medications and supplements . Functional ability and status . Nutritional status . Physical activity . Advanced directives . List of other physicians . Hospitalizations, surgeries, and ER visits in previous 12 months . Vitals . Screenings to include cognitive, depression, and falls . Referrals and appointments  In addition, I have reviewed and discussed with patient certain preventive protocols, quality metrics, and best practice recommendations. A written personalized care plan for preventive services as well as general preventive health recommendations were provided to patient.     Andrez Grime, LPN  [id number]

## 2017-09-28 ENCOUNTER — Encounter: Payer: Self-pay | Admitting: Physician Assistant

## 2017-09-28 ENCOUNTER — Ambulatory Visit (INDEPENDENT_AMBULATORY_CARE_PROVIDER_SITE_OTHER): Payer: PPO | Admitting: Physician Assistant

## 2017-09-28 VITALS — BP 118/74 | HR 59 | Temp 97.8°F | Resp 18 | Ht 65.0 in | Wt 162.0 lb

## 2017-09-28 DIAGNOSIS — E785 Hyperlipidemia, unspecified: Secondary | ICD-10-CM

## 2017-09-28 DIAGNOSIS — R7303 Prediabetes: Secondary | ICD-10-CM | POA: Diagnosis not present

## 2017-09-28 NOTE — Progress Notes (Signed)
Patient ID: Daisy Nelson, female    DOB: October 13, 1946, 71 y.o.   MRN: [id number]  PCP: Harrison Mons, PA-C  Chief Complaint  Patient presents with  . Hyperlipidemia  . Diabetes  . Follow-up    Subjective:   Presents for evaluation of hyperlipidemia and pre-diabetes.  She is tolerating her medications well. Completed recent antibiotic for UTI with resolution of symptoms.  A1C was 6.1% in April. TG have been elevated, but LDL was 81 last check.   Review of Systems Denies chest pain, shortness of breath, HA, dizziness, vision change, nausea, vomiting, diarrhea, constipation, melena, hematochezia, dysuria, increased urinary urgency or frequency, increased hunger or thirst, unintentional weight change, unexplained myalgias or arthralgias, rash.    Patient Active Problem List   Diagnosis Date Noted  . Hormone replacement therapy 03/23/2017  . Preretinal fibrosis, right eye 08/25/2016  . Prediabetes 03/26/2015  . PAF (paroxysmal atrial fibrillation) ([place]) 07/10/2013  . Atrial fibrillation with rapid ventricular response ([place]) 02/06/2013  . Family history of coronary artery bypass surgery 02/06/2013  . GERD (gastroesophageal reflux disease) 10/25/2012  . Allergic rhinitis 10/25/2012  . Hyperlipidemia 10/25/2012  . Depression 10/25/2012  . Anxiety 10/25/2012  . MVP (mitral valve prolapse) 10/25/2012  . DJD (degenerative joint disease) of cervical spine 10/25/2012  . Nephrolithiasis 10/25/2012  . Colon polyps 10/25/2012     Prior to Admission medications   Medication Sig Start Date End Date Taking? Authorizing Provider  atorvastatin (LIPITOR) 40 MG tablet Take 1 tablet (40 mg total) by mouth daily. 03/25/17  Yes Lizmary Nader, PA-C  estradiol (ESTRACE) 1 MG tablet Take 1 tablet (1 mg total) by mouth daily. 03/23/17  Yes Marv Alfrey, PA-C  flecainide (TAMBOCOR) 100 MG tablet Take 2 tablets by mouth with onset of atrial fib. No more than once a week 01/18/17  Yes  Hilty, Nadean Corwin, MD  fluticasone (FLONASE) 50 MCG/ACT nasal spray Place 2 sprays into both nostrils daily. 03/23/17  Yes Kewanda Poland, PA-C  metoprolol tartrate (LOPRESSOR) 25 MG tablet TAKE 1/2 TABLET BY MOUTH TWICE DAILY 08/31/17  Yes Hilty, Nadean Corwin, MD  ranitidine (ZANTAC) 150 MG tablet Take 150 mg by mouth every evening.    Yes [provider]  rivaroxaban (XARELTO) 20 MG TABS tablet Take 1 tablet (20 mg total) by mouth daily with supper. Patient taking differently: Take 20 mg by mouth daily with supper. WILL STOP PRIOR TO PROCEDURE 08/31/15  Yes Isaiah Serge, NP  ibuprofen (ADVIL,MOTRIN) 200 MG tablet Take 400 mg by mouth every 6 (six) hours as needed for headache.    [provider]     Allergies  Allergen Reactions  . Adhesive [Tape]     Burn skin  . Erythromycin     Gi intolerance  . Naproxen Nausea And Vomiting    Abdominal pain   . Penicillins Hives    Has patient had a PCN reaction causing immediate rash, facial/tongue/throat swelling, SOB or lightheadedness with hypotension: UNKNOWN Has patient had a PCN reaction causing severe rash involving mucus membranes or skin necrosis: No Has patient had a PCN reaction that required hospitalization No Has patient had a PCN reaction occurring within the last 10 years: No If all of the above answers are "NO", then may proceed with Cephalosporin use.        Objective:  Physical Exam  Constitutional: She is oriented to person, place, and time. She appears well-developed and well-nourished. She is active and cooperative. No distress.  BP 118/74 (BP Location: Left Arm, Patient Position: Sitting, Cuff Size: Normal)   Pulse (!) 59   Temp 97.8 F (36.6 C) (Oral)   Resp 18   Ht 5\' 5"  (1.651 m)   Wt 162 lb (73.5 kg)   SpO2 96%   BMI 26.96 kg/m   HENT:  Head: Normocephalic and atraumatic.  Right Ear: Hearing normal.  Left Ear: Hearing normal.  Eyes: Conjunctivae are normal. No scleral icterus.  Neck: Normal  range of motion. Neck supple. No thyromegaly present.  Cardiovascular: Normal rate, regular rhythm and normal heart sounds.   Pulses:      Radial pulses are 2+ on the right side, and 2+ on the left side.  Pulmonary/Chest: Effort normal and breath sounds normal.  Lymphadenopathy:       Head (right side): No tonsillar, no preauricular, no posterior auricular and no occipital adenopathy present.       Head (left side): No tonsillar, no preauricular, no posterior auricular and no occipital adenopathy present.    She has no cervical adenopathy.       Right: No supraclavicular adenopathy present.       Left: No supraclavicular adenopathy present.  Neurological: She is alert and oriented to person, place, and time. No sensory deficit.  Skin: Skin is warm, dry and intact. No rash noted. No cyanosis or erythema. Nails show no clubbing.  Psychiatric: She has a normal mood and affect. Her speech is normal and behavior is normal.      Assessment & Plan:   Problem List Items Addressed This Visit    Hyperlipidemia - Primary (Chronic)    TG elevated, likely due to prediabetes. Otherwise, has been controlled. Await labs. Adjust regimen as indicated by results.      Relevant Orders   Comprehensive metabolic panel   Lipid panel   Prediabetes    Has been well controlled, anticipate same today. Will recommend metformin if A1C >6.5%.      Relevant Orders   Comprehensive metabolic panel   Hemoglobin A1c       Return in about 6 months (around 03/29/2018) for re-evalaution of choelsterol and prediabetes.   Fara Chute, PA-C [facility]

## 2017-09-28 NOTE — Assessment & Plan Note (Signed)
TG elevated, likely due to prediabetes. Otherwise, has been controlled. Await labs. Adjust regimen as indicated by results.

## 2017-09-28 NOTE — Assessment & Plan Note (Signed)
>>  ASSESSMENT AND PLAN FOR PREDIABETES WRITTEN ON 09/28/2017 10:16 AM BY JEFFERY, CHELLE, PA-C  Has been well controlled, anticipate same today. Will recommend metformin if A1C >6.5%.

## 2017-09-28 NOTE — Assessment & Plan Note (Signed)
Has been well controlled, anticipate same today. Will recommend metformin if A1C >6.5%.

## 2017-09-28 NOTE — Progress Notes (Signed)
Subjective:    Patient ID: Daisy Nelson, female    DOB: 1946-09-27, 71 y.o.   MRN: [id number]  HPI  Daisy Nelson is a 71 year old Caucasian female with a past medical history significant for GERD, allergic rhinitis, hyperlipidemia and prediabetes who presents today for hyperlipidemia and prediabetes follow-up. Daisy Nelson reports she is doing very well.   Her last Hemoglobin A1c was 6.1% on 03/23/17.   Daisy Nelson states she is compliant with all of her medications and is tolerating them well without any side effects. Daisy Nelson reports she is not checking her blood glucose levels at home. She denies headaches, dizziness, or  vision changes. Daisy Nelson reports her last optometry appointment was in January. She denies changes in sensation in her hands or feet. Daisy Nelson reports she does not current perform feet checks. She reports she does not have any increased urinary frequency after her course of antibiotics for her UTI 2 weeks ago. She also denies hematuria.  Daisy Nelson states that she has a granola bar for breakfast with the occasional bacon and eggs, lunch is a sandwich and supper is usually a meat with a variety of vegetables. She states she will occasionally eat some fast food. Daisy Nelson reports she is pretty active, but does not have a scheduled exercise program. Patient denies alcohol use. She reports she previously smoked 35-40 years, but denies current tobacco use. Daisy Nelson reports her husband continues to smoke daily. She also denies illicit drug use.    Medications:  Prior to Admission medications   Medication Sig Start Date End Date Taking? Authorizing Provider  atorvastatin (LIPITOR) 40 MG tablet Take 1 tablet (40 mg total) by mouth daily. 03/25/17  Yes Jeffery, Chelle, PA-C  estradiol (ESTRACE) 1 MG tablet Take 1 tablet (1 mg total) by mouth daily. 03/23/17  Yes Jeffery, Chelle, PA-C  flecainide (TAMBOCOR) 100 MG tablet Take 2 tablets by mouth with  onset of atrial fib. No more than once a week 01/18/17  Yes Hilty, Nadean Corwin, MD  fluticasone (FLONASE) 50 MCG/ACT nasal spray Place 2 sprays into both nostrils daily. 03/23/17  Yes Jeffery, Chelle, PA-C  metoprolol tartrate (LOPRESSOR) 25 MG tablet TAKE 1/2 TABLET BY MOUTH TWICE DAILY 08/31/17  Yes Hilty, Nadean Corwin, MD  ranitidine (ZANTAC) 150 MG tablet Take 150 mg by mouth every evening.    Yes [provider]  rivaroxaban (XARELTO) 20 MG TABS tablet Take 1 tablet (20 mg total) by mouth daily with supper. Patient taking differently: Take 20 mg by mouth daily with supper. WILL STOP PRIOR TO PROCEDURE 08/31/15  Yes Isaiah Serge, NP  ibuprofen (ADVIL,MOTRIN) 200 MG tablet Take 400 mg by mouth every 6 (six) hours as needed for headache.    [provider]   Allergies:  Allergies  Allergen Reactions  . Adhesive [Tape]     Burn skin  . Erythromycin     Gi intolerance  . Naproxen Nausea And Vomiting    Abdominal pain   . Penicillins Hives    Has patient had a PCN reaction causing immediate rash, facial/tongue/throat swelling, SOB or lightheadedness with hypotension: UNKNOWN Has patient had a PCN reaction causing severe rash involving mucus membranes or skin necrosis: No Has patient had a PCN reaction that required hospitalization No Has patient had a PCN reaction occurring within the last 10 years: No If all of the above answers are "NO", then may proceed with Cephalosporin use.    Chronic Medical Conditions:  Patient Active Problem List   Diagnosis Date Noted  . Hormone replacement therapy 03/23/2017  . Preretinal fibrosis, right eye 08/25/2016  . Hyperglycemia 03/26/2015  . PAF (paroxysmal atrial fibrillation) ([place]) 07/10/2013  . Atrial fibrillation with rapid ventricular response ([place]) 02/06/2013  . Family history of coronary artery bypass surgery 02/06/2013  . GERD (gastroesophageal reflux disease) 10/25/2012  . Allergic rhinitis 10/25/2012  . Hyperlipidemia  10/25/2012  . Depression 10/25/2012  . Anxiety 10/25/2012  . MVP (mitral valve prolapse) 10/25/2012  . DJD (degenerative joint disease) of cervical spine 10/25/2012  . Nephrolithiasis 10/25/2012  . Colon polyps 10/25/2012   Review of Systems     Objective:   Physical Exam  Constitutional: She appears well-developed and well-nourished. She is active and cooperative.  BP 118/74 (BP Location: Left Arm, Patient Position: Sitting, Cuff Size: Normal)   Pulse (!) 59   Temp 97.8 F (36.6 C) (Oral)   Resp 18   Ht 5\' 5"  (1.651 m)   Wt 162 lb (73.5 kg)   SpO2 96%   BMI 26.96 kg/m    HENT:  Head: Normocephalic and atraumatic.  Eyes: Pupils are equal, round, and reactive to light. Conjunctivae are normal.  Neck: Normal range of motion. Neck supple. No thyromegaly present.  Cardiovascular: Normal rate, regular rhythm, S1 normal, S2 normal and normal heart sounds.   No murmur heard. Pulses:      Radial pulses are 2+ on the right side, and 2+ on the left side.       Dorsalis pedis pulses are 2+ on the right side, and 2+ on the left side.       Posterior tibial pulses are 1+ on the right side, and 1+ on the left side.  Lymphadenopathy:    She has no cervical adenopathy.  Neurological: She is alert.  Skin: Skin is warm and dry.      Assessment & Plan:  1. Hyperlipidemia, unspecified hyperlipidemia type - Lipid panel obtained today in clinic - CMP obtained today in clinic - Continue Atorvastatin 40mg  daily - Encouraged patient to attempt to eat well-balanced meals that include fruits and vegetables, whole grains, lean proteins, and low sodium foods. Also encouraged patient to get at least 30 minutes of exercise 5 times per week.  - Return to clinic in 6 months for hyperlipidemia follow-up.  2. Prediabetes - Hemoglobin A1c obtained today in clinic - CMP obtained today in clinic  - Encouraged patient to attempt to eat well-balanced meals that include fruits and vegetables, whole  grains, lean proteins, and low sodium foods. Also encouraged patient to get at least 30 minutes of exercise 5 times per week.  - Also encouraged patient to check blood glucose levels at home.  - Return to clinic in 6 months for prediabetes follow-up.  Thalia Party, PA-S

## 2017-09-28 NOTE — Patient Instructions (Addendum)
Let your pharmacy know that you'd like the Shingrix vaccine (for shingles). They'll put you on the waiting list.    IF you received an x-ray today, you will receive an invoice from [facility]. Please contact [facility] at [phone] with questions or concerns regarding your invoice.   IF you received labwork today, you will receive an invoice from [facility]. Please contact [facility] at [phone] with questions or concerns regarding your invoice.   Our billing staff will not be able to assist you with questions regarding bills from these companies.  You will be contacted with the lab results as soon as they are available. The fastest way to get your results is to activate your My Chart account. Instructions are located on the last page of this paperwork. If you have not heard from [place] regarding the results in 2 weeks, please contact this office.

## 2017-09-29 LAB — HEMOGLOBIN A1C
Est. average glucose Bld gHb Est-mCnc: 128 mg/dL
Hgb A1c MFr Bld: 6.1 % — ABNORMAL HIGH (ref 4.8–5.6)

## 2017-09-29 LAB — LIPID PANEL
Chol/HDL Ratio: 2.6 ratio (ref 0.0–4.4)
Cholesterol, Total: 143 mg/dL (ref 100–199)
HDL: 56 mg/dL (ref >39)
LDL Calculated: 62 mg/dL (ref 0–99)
Triglycerides: 127 mg/dL (ref 0–149)
VLDL Cholesterol Cal: 25 mg/dL (ref 5–40)

## 2017-09-29 LAB — COMPREHENSIVE METABOLIC PANEL
ALT: 44 IU/L — ABNORMAL HIGH (ref 0–32)
AST: 33 IU/L (ref 0–40)
Albumin/Globulin Ratio: 1.7 (ref 1.2–2.2)
Albumin: 4.3 g/dL (ref 3.6–4.8)
Alkaline Phosphatase: 115 IU/L (ref 39–117)
BUN/Creatinine Ratio: 15 (ref 12–28)
BUN: 12 mg/dL (ref 8–27)
Bilirubin Total: 0.5 mg/dL (ref 0.0–1.2)
CO2: 22 mmol/L (ref 20–29)
Calcium: 9.2 mg/dL (ref 8.7–10.3)
Chloride: 99 mmol/L (ref 96–106)
Creatinine, Ser: 0.79 mg/dL (ref 0.57–1.00)
GFR calc Af Amer: 88 mL/min/{1.73_m2} (ref >59)
GFR calc non Af Amer: 77 mL/min/{1.73_m2} (ref >59)
Globulin, Total: 2.6 g/dL (ref 1.5–4.5)
Glucose: 112 mg/dL — ABNORMAL HIGH (ref 65–99)
Potassium: 4.7 mmol/L (ref 3.5–5.2)
Sodium: 139 mmol/L (ref 134–144)
Total Protein: 6.9 g/dL (ref 6.0–8.5)

## 2017-10-03 ENCOUNTER — Encounter: Payer: Self-pay | Admitting: Physician Assistant

## 2017-11-03 ENCOUNTER — Encounter: Payer: Self-pay | Admitting: Physician Assistant

## 2017-11-03 ENCOUNTER — Other Ambulatory Visit: Payer: Self-pay

## 2017-11-03 ENCOUNTER — Ambulatory Visit (INDEPENDENT_AMBULATORY_CARE_PROVIDER_SITE_OTHER): Payer: PPO | Admitting: Physician Assistant

## 2017-11-03 VITALS — BP 120/66 | HR 48 | Temp 98.1°F | Resp 16 | Ht 65.0 in | Wt 162.2 lb

## 2017-11-03 DIAGNOSIS — J029 Acute pharyngitis, unspecified: Secondary | ICD-10-CM | POA: Diagnosis not present

## 2017-11-03 LAB — POCT RAPID STREP A (OFFICE): Rapid Strep A Screen: NEGATIVE

## 2017-11-03 MED ORDER — CEPHALEXIN 500 MG PO CAPS: 500.0000 mg | ORAL_CAPSULE | Freq: Two times a day (BID) | ORAL | 0 refills | Status: AC

## 2017-11-03 NOTE — Patient Instructions (Addendum)
  Start using Flonase - do this at night before bed, as well as during the day.  Cepacol for sore throat.   Stay well hydrated. Get lost of rest.  Advil or ibuprofen for pain. Do not take Aspirin.  Drink enough water and fluids to keep your urine clear or pale yellow. For sore throat: ? Gargle with 8 oz of salt water ( tsp of salt per 1 qt of water) as often as every 1-2 hours to soothe your throat.  Gargle liquid benadryl.  Use Elderberry syrup.   For sore throat try using a honey-based tea. Use 3 teaspoons of honey with juice squeezed from half lemon. Place shaved pieces of ginger into 1/2-1 cup of water and warm over stove top. Then mix the ingredients and repeat every 4 hours as needed.  Cough Syrup Recipe: Sweet Lemon & Honey Thyme  Ingredients a handful of fresh thyme sprigs   1 pint of water (2 cups)  1/2 cup honey (raw is best, but regular will do)  1/2 lemon chopped Instructions 1. Place the lemon in the pint jar and cover with the honey. The honey will macerate the lemons and draw out liquids which taste so delicious! 2. Meanwhile, toss the thyme leaves into a saucepan and cover them with the water. 3. Bring the water to a gentle simmer and reduce it to half, about a cup of tea. 4. When the tea is reduced and cooled a bit, strain the sprigs & leaves, add it into the pint jar and stir it well. 5. Give it a shake and use a spoonful as needed. 6. Store your homemade cough syrup in the refrigerator for about a month.   Is there anything I can do on my own to get rid of my cough? Yes. To help get rid of your cough, you can: ?Use a humidifier in your bedroom ?Use an over-the-counter cough medicine, or suck on cough drops or hard candy ?Stop smoking, if you smoke ?If you have allergies, avoid the things you are allergic to (like pollen, dust, animals, or mold) If you have acid reflux, your doctor or nurse will tell you which lifestyle changes can help reduce symptoms.  Come  back if you are not better in 7-10 days.    IF you received an x-ray today, you will receive an invoice from [facility]. Please contact [facility] at [phone] with questions or concerns regarding your invoice.   IF you received labwork today, you will receive an invoice from [facility]. Please contact [facility] at [phone] with questions or concerns regarding your invoice.   Our billing staff will not be able to assist you with questions regarding bills from these companies.  You will be contacted with the lab results as soon as they are available. The fastest way to get your results is to activate your My Chart account. Instructions are located on the last page of this paperwork. If you have not heard from [place] regarding the results in 2 weeks, please contact this office.

## 2017-11-03 NOTE — Progress Notes (Signed)
Daisy Nelson  MRN: [id number] DOB: July 11, 1946  PCP: Harrison Mons, PA-C  Subjective:  Pt is a 71 year old female PMH GERD, MVP, PAF, HLD, anxiety and depression who presents to clinic for sore throat x 4 days. Endorses losing her voice, pain with swallowing, "scratchy" feeling, nasal drainage and an occasional cough. Symptoms are not improving. Denies  fever, chills, n/v, rash, fatigue.  She has been taking ibuprofen and tylenol.   Review of Systems  Constitutional: Negative for chills, diaphoresis, fatigue and fever.  HENT: Positive for postnasal drip and sore throat. Negative for congestion, rhinorrhea, sinus pressure and sinus pain.   Respiratory: Positive for cough.   Cardiovascular: Negative for chest pain and palpitations.  Gastrointestinal: Negative for nausea and vomiting.  Skin: Positive for rash.  Psychiatric/Behavioral: Positive for sleep disturbance.    Patient Active Problem List   Diagnosis Date Noted  . Hormone replacement therapy 03/23/2017  . Preretinal fibrosis, right eye 08/25/2016  . Prediabetes 03/26/2015  . PAF (paroxysmal atrial fibrillation) ([place]) 07/10/2013  . Atrial fibrillation with rapid ventricular response ([place]) 02/06/2013  . Family history of coronary artery bypass surgery 02/06/2013  . GERD (gastroesophageal reflux disease) 10/25/2012  . Allergic rhinitis 10/25/2012  . Hyperlipidemia 10/25/2012  . Depression 10/25/2012  . Anxiety 10/25/2012  . MVP (mitral valve prolapse) 10/25/2012  . DJD (degenerative joint disease) of cervical spine 10/25/2012  . Nephrolithiasis 10/25/2012  . Colon polyps 10/25/2012    Current Outpatient Medications on File Prior to Visit  Medication Sig Dispense Refill  . atorvastatin (LIPITOR) 40 MG tablet Take 1 tablet (40 mg total) by mouth daily. 90 tablet 3  . estradiol (ESTRACE) 1 MG tablet Take 1 tablet (1 mg total) by mouth daily. 90 tablet 3  . flecainide (TAMBOCOR) 100 MG tablet Take 2 tablets by mouth  with onset of atrial fib. No more than once a week 6 tablet 5  . fluticasone (FLONASE) 50 MCG/ACT nasal spray Place 2 sprays into both nostrils daily. 48 g 3  . metoprolol tartrate (LOPRESSOR) 25 MG tablet TAKE 1/2 TABLET BY MOUTH TWICE DAILY 60 tablet 3  . ranitidine (ZANTAC) 150 MG tablet Take 150 mg by mouth every evening.     . rivaroxaban (XARELTO) 20 MG TABS tablet Take 1 tablet (20 mg total) by mouth daily with supper. (Patient taking differently: Take 20 mg by mouth daily with supper. WILL STOP PRIOR TO PROCEDURE) 30 tablet 0  . ibuprofen (ADVIL,MOTRIN) 200 MG tablet Take 400 mg by mouth every 6 (six) hours as needed for headache.     No current facility-administered medications on file prior to visit.     Allergies  Allergen Reactions  . Adhesive [Tape]     Burn skin  . Erythromycin     Gi intolerance  . Naproxen Nausea And Vomiting    Abdominal pain   . Penicillins Hives    Has patient had a PCN reaction causing immediate rash, facial/tongue/throat swelling, SOB or lightheadedness with hypotension: UNKNOWN Has patient had a PCN reaction causing severe rash involving mucus membranes or skin necrosis: No Has patient had a PCN reaction that required hospitalization No Has patient had a PCN reaction occurring within the last 10 years: No If all of the above answers are "NO", then may proceed with Cephalosporin use.      Objective:  BP 120/66   Pulse (!) 48   Temp 98.1 F (36.7 C) (Oral)   Resp 16   Ht 5\' 5"  (  1.651 m)   Wt 162 lb 3.2 oz (73.6 kg)   SpO2 96%   BMI 26.99 kg/m   Physical Exam  Constitutional: She is oriented to person, place, and time and well-developed, well-nourished, and in no distress. No distress.  HENT:  Right Ear: External ear normal.  Left Ear: External ear normal.  Mouth/Throat: Oropharynx is clear and moist. No oropharyngeal exudate.  Eyes: Conjunctivae are normal.  Cardiovascular: Normal rate, regular rhythm and normal heart sounds.    Pulmonary/Chest: No respiratory distress.  Neurological: She is alert and oriented to person, place, and time. GCS score is 15.  Skin: Skin is warm and dry.  Psychiatric: Mood, memory, affect and judgment normal.  Vitals reviewed.   Results for orders placed or performed in visit on 11/03/17  POCT rapid strep A  Result Value Ref Range   Rapid Strep A Screen Negative Negative    Assessment and Plan :  1. Sore throat 2. Acute pharyngitis, unspecified etiology - POCT rapid strep A - Culture, Group A Strep - cephALEXin (KEFLEX) 500 MG capsule; Take 1 capsule (500 mg total) by mouth 2 (two) times daily for 10 days.  Dispense: 20 capsule; Refill: 0 - Pt c/o of 3 days of sore throat and fatigue. CMA unable to obtain good throat swab due to pt's gag reflex. Two attempts were made. Written Rx for amoxicillin printed out for pt, discussed with pt this is likely viral etiology. I do not believe culture will produce true result. Advised pt to treat symptomatically x 3 days. May fill Rx if no improvement or worsening sore throat symptoms.   Mercer Pod, PA-C  [facility] at [facility] 11/03/2017 10:45 AM

## 2017-11-05 LAB — CULTURE, GROUP A STREP: Strep A Culture: NEGATIVE

## 2017-11-08 ENCOUNTER — Other Ambulatory Visit: Payer: Self-pay | Admitting: Internal Medicine

## 2017-12-14 ENCOUNTER — Encounter: Payer: Self-pay | Admitting: Internal Medicine

## 2017-12-16 DIAGNOSIS — H524 Presbyopia: Secondary | ICD-10-CM | POA: Diagnosis not present

## 2017-12-16 DIAGNOSIS — H5213 Myopia, bilateral: Secondary | ICD-10-CM | POA: Diagnosis not present

## 2017-12-16 DIAGNOSIS — Z961 Presence of intraocular lens: Secondary | ICD-10-CM | POA: Diagnosis not present

## 2017-12-16 DIAGNOSIS — H52223 Regular astigmatism, bilateral: Secondary | ICD-10-CM | POA: Diagnosis not present

## 2017-12-31 ENCOUNTER — Encounter: Payer: Self-pay | Admitting: Physician Assistant

## 2017-12-31 ENCOUNTER — Telehealth: Payer: Self-pay

## 2017-12-31 NOTE — Telephone Encounter (Signed)
Started request for PA for Estradiol today.  It may take up to 72 business hours for approval or denial.  Key code is [facility].   During process of completing form it did give formulary options.  They are Premarin Cream and Estradiol cream.

## 2018-01-01 MED ORDER — SERTRALINE HCL 50 MG PO TABS: 50.0000 mg | ORAL_TABLET | Freq: Every day | ORAL | 3 refills | Status: DC

## 2018-01-03 ENCOUNTER — Telehealth: Payer: Self-pay

## 2018-01-03 NOTE — Telephone Encounter (Signed)
Called patient and advised her that her Estradiol was approved from 12/31/17-11/29/18.  She was very Patent attorney.

## 2018-01-11 ENCOUNTER — Ambulatory Visit (INDEPENDENT_AMBULATORY_CARE_PROVIDER_SITE_OTHER): Payer: PPO | Admitting: Internal Medicine

## 2018-01-11 ENCOUNTER — Encounter: Payer: Self-pay | Admitting: Internal Medicine

## 2018-01-11 VITALS — BP 144/64 | HR 46 | Ht 65.0 in | Wt 161.4 lb

## 2018-01-11 DIAGNOSIS — E785 Hyperlipidemia, unspecified: Secondary | ICD-10-CM | POA: Diagnosis not present

## 2018-01-11 DIAGNOSIS — I480 Paroxysmal atrial fibrillation: Secondary | ICD-10-CM

## 2018-01-11 DIAGNOSIS — F419 Anxiety disorder, unspecified: Secondary | ICD-10-CM | POA: Diagnosis not present

## 2018-01-11 NOTE — Patient Instructions (Addendum)
Your physician wants you to follow-up in: ONE YEAR with Dr. Debara Pickett. You will receive a reminder letter in the mail two months in advance. If you don't receive a letter, please call our office to schedule the follow-up appointment.  [employer] administrator - [employer]

## 2018-01-11 NOTE — Progress Notes (Signed)
OFFICE NOTE  Chief Complaint:  No complaints  Primary Care Physician: Harrison Mons, PA-C  ID:  Daisy Nelson, DOB May 03, 1946, MRN [id number]  PCP:  Harrison Mons, PA-C  Primary Cardiologist:  Astra Gregg     History of Present Illness: Daisy Nelson is a 72 y.o. female no prior history of CAD or AF, presented to the ER 3/10 around 3am with complaints of jaw pain, palpitations, and SOB. Her symptoms woke her from sleep around 12:30am. In the ER she was noted to be in Rapid AF. Her EKG shows diffuse ST depression. Initial Troponin is negative. Her symptoms improved after Diltiazem IV was started. She denied any prior history of similar symptoms in the past. She did take Mucinex D earlier this week for sinus infection.   The patient was admitted to [facility] and started on diltiazem, heparin, ASA, beta blocker and nitrates. She spontaneously converted to NSR and ruled out for MI. Lexiscan myovew revealed normal LVF, no ischemia and fixed defects involving the apical segments of the inferior septum and inferior lateral walls. 2D echo confirmed normal EF of 55-60%. Mild TR. CHADSVSC= 1. She was started on Xarelto.  Lopressor was continued.   She was last seen by Tarri Fuller, PA-C, as she was about to have cataract surgery.  It went very well for her and she can now see clearly.  She reports only an accessional fluttering in her chest.  I did decrease her lopressor to 12.5 mg bid during the last visit because she was having a HR in the 40's.  She otherwise denies  nausea, vomiting, fever, chest pain, shortness of breath, orthopnea, dizziness, PND, cough, congestion, abdominal pain, hematochezia, melena, lower extremity edema.  [name] back in the office today. She is currently without complaints. She is active denies any chest pain or shortness of breath. She very rarely gets palpitations the last only for a few seconds. She denies any recurrent atrial fibrillation and her EKG today sinus  rhythm. She's had no bleeding problems on aspirin. She had laboratory work in October which shows a well controlled lipid profile on pravastatin. Weight is stable  I saw Daisy Nelson back today in the office. Overall she is doing very well. She denies any chest pain or shortness of breath. She's had no recurrent A. Fib episodes. She has not needed to take her flecainide as needed. She continues on Xarelto without any bleeding problems. Blood pressure is well-controlled today.  01/18/2017  Daisy Nelson returns today for follow-up. She reports no significant recurrent atrial fibrillation. EKG today shows sinus bradycardia with nonspecific ST changes. She is tolerating Xarelto without any bleeding problems. She has not needed the flecainide pocket pill strategy, but is due for a refill. QTC is 395 ms.  01/11/2018  Daisy Nelson was seen today in follow-up.  Overall she reports doing well.  She denies any significant recurrent atrial fibrillation.  She occasionally feels palpitations.  She does not do active heart rate monitoring.  We discussed the [employer] application as well as the newest iteration of the apple watch, both of which can monitor heart rhythms.  He is tolerating Xarelto without bleeding problems.  She has not required flecainide.  She may need a new prescription as a drug could be older and she should check back with [place] when she looks at her bottle.   Wt Readings from Last 3 Encounters:  01/11/18 161 lb 6.4 oz (73.2 kg)  11/03/17 162 lb 3.2 oz (73.6 kg)  09/28/17 162 lb (73.5 kg)     Past Medical History:  Diagnosis Date  . AF (atrial fibrillation) ([place])    AF with RVR 01/2013  . Allergy   . Anxiety   . Atrial fibrillation with RVR ([place]) 08/31/2015  . Colon polyps   . Complication of anesthesia 2011   colonscopy- "slow to awaken"  . Depression   . DJD (degenerative joint disease) of cervical spine   . Family history of CABG   . GERD (gastroesophageal reflux disease)   .  History of kidney stones   . Hyperglycemia   . Hyperlipidemia   . Jaw pain 08/31/2015  . Pneumonia   . PONV (postoperative nausea and vomiting)     Current Outpatient Medications  Medication Sig Dispense Refill  . atorvastatin (LIPITOR) 40 MG tablet Take 1 tablet (40 mg total) by mouth daily. 90 tablet 3  . estradiol (ESTRACE) 1 MG tablet Take 1 tablet (1 mg total) by mouth daily. 90 tablet 3  . flecainide (TAMBOCOR) 100 MG tablet Take 2 tablets by mouth with onset of atrial fib. No more than once a week 6 tablet 5  . fluticasone (FLONASE) 50 MCG/ACT nasal spray Place 2 sprays into both nostrils daily. 48 g 3  . metoprolol tartrate (LOPRESSOR) 25 MG tablet TAKE 1/2 TABLET BY MOUTH TWICE DAILY 60 tablet 3  . ranitidine (ZANTAC) 150 MG tablet Take 150 mg by mouth every evening.     . sertraline (ZOLOFT) 50 MG tablet Take 1 tablet (50 mg total) by mouth daily. 90 tablet 3  . XARELTO 20 MG TABS tablet TAKE 1 TABLET BY MOUTH ONCE DAILY WITH SUPPER 90 tablet 1   No current facility-administered medications for this visit.     Allergies:    Allergies  Allergen Reactions  . Adhesive [Tape]     Burn skin  . Erythromycin     Gi intolerance  . Naproxen Nausea And Vomiting    Abdominal pain   . Penicillins Hives    Has patient had a PCN reaction causing immediate rash, facial/tongue/throat swelling, SOB or lightheadedness with hypotension: UNKNOWN Has patient had a PCN reaction causing severe rash involving mucus membranes or skin necrosis: No Has patient had a PCN reaction that required hospitalization No Has patient had a PCN reaction occurring within the last 10 years: No If all of the above answers are "NO", then may proceed with Cephalosporin use.     Social History:  The patient  reports that she quit smoking about 13 years ago. Her smoking use included cigarettes. She started smoking about 54 years ago. She has a 18.00 pack-year smoking history. she has never used smokeless tobacco.  She reports that she does not drink alcohol or use drugs.   Family history:   Family History  Problem Relation Age of Onset  . Stroke Mother   . Diabetes Father   . Heart disease Father        CABG  . Arthritis Father        C6-7  . Skin cancer Father        SCC  . Arthritis Brother        s/p THR    ROS: A comprehensive review of systems was negative.  HOME MEDS: Current Outpatient Medications  Medication Sig Dispense Refill  . atorvastatin (LIPITOR) 40 MG tablet Take 1 tablet (40 mg total) by mouth daily. 90 tablet 3  . estradiol (ESTRACE) 1 MG tablet Take 1 tablet (1  mg total) by mouth daily. 90 tablet 3  . flecainide (TAMBOCOR) 100 MG tablet Take 2 tablets by mouth with onset of atrial fib. No more than once a week 6 tablet 5  . fluticasone (FLONASE) 50 MCG/ACT nasal spray Place 2 sprays into both nostrils daily. 48 g 3  . metoprolol tartrate (LOPRESSOR) 25 MG tablet TAKE 1/2 TABLET BY MOUTH TWICE DAILY 60 tablet 3  . ranitidine (ZANTAC) 150 MG tablet Take 150 mg by mouth every evening.     . sertraline (ZOLOFT) 50 MG tablet Take 1 tablet (50 mg total) by mouth daily. 90 tablet 3  . XARELTO 20 MG TABS tablet TAKE 1 TABLET BY MOUTH ONCE DAILY WITH SUPPER 90 tablet 1   No current facility-administered medications for this visit.     LABS/IMAGING: No results found for this or any previous visit (from the past 48 hour(s)). No results found.  VITALS: BP (!) 144/64   Pulse (!) 46   Ht 5\' 5"  (1.651 m)   Wt 161 lb 6.4 oz (73.2 kg)   BMI 26.86 kg/m   EXAM: General appearance: alert and no distress Neck: no carotid bruit and no JVD Lungs: clear to auscultation bilaterally Heart: regular rate and rhythm, S1, S2 normal, no murmur, click, rub or gallop Abdomen: soft, non-tender; bowel sounds normal; no masses,  no organomegaly Extremities: extremities normal, atraumatic, no cyanosis or edema Pulses: 2+ and symmetric Skin: Skin color, texture, turgor normal. No rashes or  lesions Neurologic: Grossly normal Psych: Mood, affect normal  EKG: Sinus bradycardia at 46, nonspecific ST and T wave changes-personally reviewed  ASSESSMENT: 1. Paroxysmal atrial fibrillation - no significant recurrence, low CHADSVASC score of 1 (female >65) on Xarelto 2. Pocket pill flecainide strategy 3. Dyslipidemia- at goal  PLAN: 1.   Daisy Nelson is doing well without recurrent A. fib.  She may need a refill of her flecainide.  She is to call us with the date on her prescription.  She should remain on Xarelto without any bleeding difficulty.  Her cholesterol actually is much improved based on a study 3 months ago which showed a total cholesterol 143, triglycerides 127, HDL 56 and LDL 62.  Plan to continue her current medications.  Follow-up with me annually or sooner as necessary.  Pixie Casino, MD, [facility], [place] Director of the Advanced Lipid Disorders &  Cardiovascular Risk Reduction Clinic Diplomate of the American Board of Clinical Lipidology Attending Cardiologist  Direct Dial: [phone]  Fax: [phone]  Website:  [email or web address] 01/11/2018, 1:56 PM

## 2018-01-28 ENCOUNTER — Other Ambulatory Visit: Payer: Self-pay

## 2018-01-28 ENCOUNTER — Encounter: Payer: Self-pay | Admitting: Physician Assistant

## 2018-01-28 ENCOUNTER — Ambulatory Visit (INDEPENDENT_AMBULATORY_CARE_PROVIDER_SITE_OTHER): Payer: PPO | Admitting: Physician Assistant

## 2018-01-28 VITALS — BP 118/56 | HR 57 | Temp 98.9°F | Resp 16 | Ht 65.0 in | Wt 158.4 lb

## 2018-01-28 DIAGNOSIS — J0191 Acute recurrent sinusitis, unspecified: Secondary | ICD-10-CM | POA: Diagnosis not present

## 2018-01-28 DIAGNOSIS — F329 Major depressive disorder, single episode, unspecified: Secondary | ICD-10-CM

## 2018-01-28 DIAGNOSIS — F32A Depression, unspecified: Secondary | ICD-10-CM

## 2018-01-28 DIAGNOSIS — F419 Anxiety disorder, unspecified: Secondary | ICD-10-CM | POA: Diagnosis not present

## 2018-01-28 MED ORDER — BENZONATATE 100 MG PO CAPS: 100.0000 mg | ORAL_CAPSULE | Freq: Three times a day (TID) | ORAL | 0 refills | Status: DC | PRN

## 2018-01-28 MED ORDER — GUAIFENESIN ER 1200 MG PO TB12: 1.0000 | ORAL_TABLET | Freq: Two times a day (BID) | ORAL | 1 refills | Status: DC | PRN

## 2018-01-28 MED ORDER — MONTELUKAST SODIUM 10 MG PO TABS: 10.0000 mg | ORAL_TABLET | Freq: Every day | ORAL | 3 refills | Status: DC

## 2018-01-28 MED ORDER — CEFDINIR 300 MG PO CAPS: 600.0000 mg | ORAL_CAPSULE | Freq: Every day | ORAL | 0 refills | Status: DC

## 2018-01-28 NOTE — Assessment & Plan Note (Signed)
Improving, back on sertraline.  Continue 50 mg for now. OK to increase to 100 mg if needed.

## 2018-01-28 NOTE — Progress Notes (Signed)
Patient ID: MARRY KUSCH, female    DOB: 07-May-1946, 72 y.o.   MRN: [id number]  PCP: Harrison Mons, PA-C  Chief Complaint  Patient presents with  . Medication Refill    follow up on sertraline  . URI    nasal congestion, bloody greenish mucus, sinus pressure x 1 weel     Subjective:   Presents for evaluation of mood since resuming sertraline about 4 weeks ago.  She was feeling irritable and tense all the time, unable to sleep because her mind wouldn't stop. She "let it go too long," and had a big blow up with her husband. Feels much better, but isn't back to her normal. Doesn't want to increase the dose yet, wants to give it a few more weeks.  Also has recurrent sinus pressure, drainage and cough. Seen here for same on 11/03/2017. Had another episode in January, that resolved with OTC preparations. This episode began 7 days ago and is not responding to OTC remedies. Started with a scratchy throat, then nasal congestion, drainage and cough, which leads to gagging. No chest pressure, SOB, CP.    Review of Systems As above.  Depression screen [facility] 2/9 11/03/2017 11/03/2017 09/28/2017 09/20/2017 09/08/2017  Decreased Interest 0 0 0 0 0  Down, Depressed, Hopeless 0 0 0 0 0  PHQ - 2 Score 0 0 0 0 0  Altered sleeping - - - 1 -  Tired, decreased energy - - - 0 -  Change in appetite - - - 0 -  Feeling bad or failure about yourself  - - - 0 -  Trouble concentrating - - - 0 -  Moving slowly or fidgety/restless - - - 0 -  Suicidal thoughts - - - 0 -  PHQ-9 Score - - - 1 -  Difficult doing work/chores - - - Not difficult at all -     Patient Active Problem List   Diagnosis Date Noted  . Hormone replacement therapy 03/23/2017  . Preretinal fibrosis, right eye 08/25/2016  . Prediabetes 03/26/2015  . PAF (paroxysmal atrial fibrillation) ([place]) 07/10/2013  . Atrial fibrillation with rapid ventricular response ([place]) 02/06/2013  . Family history of coronary artery bypass  surgery 02/06/2013  . GERD (gastroesophageal reflux disease) 10/25/2012  . Allergic rhinitis 10/25/2012  . Hyperlipidemia 10/25/2012  . Depression 10/25/2012  . Anxiety 10/25/2012  . MVP (mitral valve prolapse) 10/25/2012  . DJD (degenerative joint disease) of cervical spine 10/25/2012  . Nephrolithiasis 10/25/2012  . Colon polyps 10/25/2012     Prior to Admission medications   Medication Sig Start Date End Date Taking? Authorizing Provider  atorvastatin (LIPITOR) 40 MG tablet Take 1 tablet (40 mg total) by mouth daily. 03/25/17  Yes Janaysha Depaulo, PA-C  estradiol (ESTRACE) 1 MG tablet Take 1 tablet (1 mg total) by mouth daily. 03/23/17  Yes Stefanee Mckell, PA-C  flecainide (TAMBOCOR) 100 MG tablet Take 2 tablets by mouth with onset of atrial fib. No more than once a week 01/18/17  Yes Hilty, Nadean Corwin, MD  fluticasone (FLONASE) 50 MCG/ACT nasal spray Place 2 sprays into both nostrils daily. 03/23/17  Yes Mohamedamin Nifong, PA-C  metoprolol tartrate (LOPRESSOR) 25 MG tablet TAKE 1/2 TABLET BY MOUTH TWICE DAILY 08/31/17  Yes Hilty, Nadean Corwin, MD  ranitidine (ZANTAC) 150 MG tablet Take 150 mg by mouth every evening.    Yes [provider]  sertraline (ZOLOFT) 50 MG tablet Take 1 tablet (50 mg total) by mouth daily. 01/01/18  Yes Kyree Fedorko, PA-C  XARELTO 20 MG TABS tablet TAKE 1 TABLET BY MOUTH ONCE DAILY WITH SUPPER 11/09/17  Yes Hilty, Nadean Corwin, MD     Allergies  Allergen Reactions  . Adhesive [Tape]     Burn skin  . Erythromycin     Gi intolerance  . Naproxen Nausea And Vomiting    Abdominal pain   . Penicillins Hives    Has patient had a PCN reaction causing immediate rash, facial/tongue/throat swelling, SOB or lightheadedness with hypotension: UNKNOWN Has patient had a PCN reaction causing severe rash involving mucus membranes or skin necrosis: No Has patient had a PCN reaction that required hospitalization No Has patient had a PCN reaction occurring within the last  10 years: No If all of the above answers are "NO", then may proceed with Cephalosporin use.        Objective:  Physical Exam  Constitutional: She is oriented to person, place, and time. She appears well-developed and well-nourished. She is active and cooperative. No distress.  BP (!) 118/56   Pulse (!) 57   Temp 98.9 F (37.2 C)   Resp 16   Ht 5\' 5"  (1.651 m)   Wt 158 lb 6.4 oz (71.8 kg)   SpO2 97%   BMI 26.36 kg/m   HENT:  Head: Normocephalic and atraumatic.  Right Ear: Hearing, tympanic membrane, external ear and ear canal normal.  Left Ear: Hearing, tympanic membrane, external ear and ear canal normal.  Eyes: Conjunctivae are normal. No scleral icterus.  Neck: Normal range of motion. Neck supple. No thyromegaly present.  Cardiovascular: Normal rate, regular rhythm and normal heart sounds.  Pulses:      Radial pulses are 2+ on the right side, and 2+ on the left side.  Pulmonary/Chest: Effort normal and breath sounds normal.  Lymphadenopathy:       Head (right side): No tonsillar, no preauricular, no posterior auricular and no occipital adenopathy present.       Head (left side): No tonsillar, no preauricular, no posterior auricular and no occipital adenopathy present.    She has no cervical adenopathy.       Right: No supraclavicular adenopathy present.       Left: No supraclavicular adenopathy present.  Neurological: She is alert and oriented to person, place, and time. No sensory deficit.  Skin: Skin is warm, dry and intact. No rash noted. No cyanosis or erythema. Nails show no clubbing.  Psychiatric: She has a normal mood and affect. Her speech is normal and behavior is normal.      Assessment & Plan:   Problem List Items Addressed This Visit    Depression (Chronic)    Improving, back on sertraline.  Continue 50 mg for now. OK to increase to 100 mg if needed.      Anxiety (Chronic)    Improving, back on sertraline.  Continue 50 mg for now. OK to increase to 100  mg if needed.       Other Visit Diagnoses    Acute recurrent sinusitis, unspecified location    -  Primary   Continue Flonase. Add Mucinex, Tessalon, and montelukast.   Relevant Medications   montelukast (SINGULAIR) 10 MG tablet   benzonatate (TESSALON) 100 MG capsule   Guaifenesin (MUCINEX MAXIMUM STRENGTH) 1200 MG TB12   cefdinir (OMNICEF) 300 MG capsule       Return for re-evaluation in April, as planned. update fasting labs at that visit.   Fara Chute, PA-C Primary Care  at [place]

## 2018-01-28 NOTE — Patient Instructions (Addendum)
Let me know if you'd like to increase the sertraline to 100 mg. You can start by taking 2 of the 50 mg tablets together, and I can send in the 100 mg dose.    IF you received an x-ray today, you will receive an invoice from [facility]. Please contact [facility] at [phone] with questions or concerns regarding your invoice.   IF you received labwork today, you will receive an invoice from [facility]. Please contact [facility] at [phone] with questions or concerns regarding your invoice.   Our billing staff will not be able to assist you with questions regarding bills from these companies.  You will be contacted with the lab results as soon as they are available. The fastest way to get your results is to activate your My Chart account. Instructions are located on the last page of this paperwork. If you have not heard from [place] regarding the results in 2 weeks, please contact this office.

## 2018-02-10 ENCOUNTER — Encounter: Payer: Self-pay | Admitting: Physician Assistant

## 2018-02-27 ENCOUNTER — Encounter: Payer: Self-pay | Admitting: Physician Assistant

## 2018-02-28 MED ORDER — SERTRALINE HCL 100 MG PO TABS: 100.0000 mg | ORAL_TABLET | Freq: Every day | ORAL | 3 refills | Status: DC

## 2018-03-07 ENCOUNTER — Encounter: Payer: Self-pay | Admitting: Physician Assistant

## 2018-03-14 ENCOUNTER — Other Ambulatory Visit: Payer: Self-pay | Admitting: Physician Assistant

## 2018-03-14 DIAGNOSIS — E785 Hyperlipidemia, unspecified: Secondary | ICD-10-CM

## 2018-03-29 ENCOUNTER — Other Ambulatory Visit: Payer: Self-pay

## 2018-03-29 ENCOUNTER — Ambulatory Visit (INDEPENDENT_AMBULATORY_CARE_PROVIDER_SITE_OTHER): Payer: PPO | Admitting: Physician Assistant

## 2018-03-29 ENCOUNTER — Encounter: Payer: Self-pay | Admitting: Physician Assistant

## 2018-03-29 VITALS — BP 132/70 | HR 54 | Temp 98.1°F | Resp 16 | Ht 65.0 in | Wt 157.6 lb

## 2018-03-29 DIAGNOSIS — R7303 Prediabetes: Secondary | ICD-10-CM | POA: Diagnosis not present

## 2018-03-29 DIAGNOSIS — E785 Hyperlipidemia, unspecified: Secondary | ICD-10-CM

## 2018-03-29 DIAGNOSIS — Z79890 Hormone replacement therapy (postmenopausal): Secondary | ICD-10-CM | POA: Diagnosis not present

## 2018-03-29 MED ORDER — ESTRADIOL 1 MG PO TABS: 1.0000 mg | ORAL_TABLET | Freq: Every day | ORAL | 3 refills | Status: DC

## 2018-03-29 NOTE — Assessment & Plan Note (Signed)
>>  ASSESSMENT AND PLAN FOR PREDIABETES WRITTEN ON 03/29/2018 11:51 AM BY JEFFERY, CHELLE, PA-C  Continue healthy lifestyle modification for risk reduction.  Update hemoglobin A1c today.

## 2018-03-29 NOTE — Patient Instructions (Addendum)
Keep up the great work! Enjoy your breakfast this AM and we will see you soon :)  Go ahead and call Dr. Hershal Coria office to schedule your 6 month follow-up visit.     IF you received an x-ray today, you will receive an invoice from [facility]. Please contact [facility] at [phone] with questions or concerns regarding your invoice.   IF you received labwork today, you will receive an invoice from [facility]. Please contact [facility] at [phone] with questions or concerns regarding your invoice.   Our billing staff will not be able to assist you with questions regarding bills from these companies.  You will be contacted with the lab results as soon as they are available. The fastest way to get your results is to activate your My Chart account. Instructions are located on the last page of this paperwork. If you have not heard from [place] regarding the results in 2 weeks, please contact this office.

## 2018-03-29 NOTE — Assessment & Plan Note (Signed)
Continue with Estrace 1 mg.  She is aware of potential risks associated with estrogen replacement therapy.  Currently benefits outweigh the risks.

## 2018-03-29 NOTE — Progress Notes (Signed)
Patient ID: Daisy Nelson, female    DOB: 1945-12-25, 72 y.o.   MRN: [id number]  PCP: Harrison Mons, PA-C  Chief Complaint  Patient presents with  . Hyperlipidemia    6 month follow up     Subjective:   Presents for evaluation of hyperlipidemia.  Feels well.  Tolerating her medications without difficulty.  No problems or concerns today.    Review of Systems  Constitutional: Negative.   Respiratory: Negative.   Cardiovascular: Negative.   Gastrointestinal: Negative.   Skin: Negative.        Patient Active Problem List   Diagnosis Date Noted  . Hormone replacement therapy 03/23/2017  . Preretinal fibrosis, right eye 08/25/2016  . Prediabetes 03/26/2015  . PAF (paroxysmal atrial fibrillation) ([place]) 07/10/2013  . Atrial fibrillation with rapid ventricular response ([place]) 02/06/2013  . Family history of coronary artery bypass surgery 02/06/2013  . GERD (gastroesophageal reflux disease) 10/25/2012  . Allergic rhinitis 10/25/2012  . Hyperlipidemia 10/25/2012  . Depression 10/25/2012  . Anxiety 10/25/2012  . MVP (mitral valve prolapse) 10/25/2012  . DJD (degenerative joint disease) of cervical spine 10/25/2012  . Nephrolithiasis 10/25/2012  . Colon polyps 10/25/2012     Prior to Admission medications   Medication Sig Start Date End Date Taking? Authorizing Provider  atorvastatin (LIPITOR) 40 MG tablet TAKE 1 TABLET BY MOUTH ONCE DAILY 03/14/18  Yes Kristal Perl, PA-C  estradiol (ESTRACE) 1 MG tablet Take 1 tablet (1 mg total) by mouth daily. 03/23/17  Yes Latona Krichbaum, PA-C  flecainide (TAMBOCOR) 100 MG tablet Take 2 tablets by mouth with onset of atrial fib. No more than once a week 01/18/17  Yes Hilty, Nadean Corwin, MD  fluticasone (FLONASE) 50 MCG/ACT nasal spray Place 2 sprays into both nostrils daily. 03/23/17  Yes Chinmayi Rumer, PA-C  metoprolol tartrate (LOPRESSOR) 25 MG tablet TAKE 1/2 TABLET BY MOUTH TWICE DAILY 08/31/17  Yes Hilty, Nadean Corwin, MD    montelukast (SINGULAIR) 10 MG tablet Take 1 tablet (10 mg total) by mouth at bedtime. 01/28/18  Yes Dashiel Bergquist, PA-C  ranitidine (ZANTAC) 150 MG tablet Take 150 mg by mouth every evening.    Yes [provider]  sertraline (ZOLOFT) 100 MG tablet Take 1 tablet (100 mg total) by mouth daily. 02/28/18  Yes Amberrose Friebel, PA-C  XARELTO 20 MG TABS tablet TAKE 1 TABLET BY MOUTH ONCE DAILY WITH SUPPER 11/09/17  Yes Hilty, Nadean Corwin, MD     Allergies  Allergen Reactions  . Adhesive [Tape]     Burn skin  . Erythromycin     Gi intolerance  . Naproxen Nausea And Vomiting    Abdominal pain   . Penicillins Hives    Has patient had a PCN reaction causing immediate rash, facial/tongue/throat swelling, SOB or lightheadedness with hypotension: UNKNOWN Has patient had a PCN reaction causing severe rash involving mucus membranes or skin necrosis: No Has patient had a PCN reaction that required hospitalization No Has patient had a PCN reaction occurring within the last 10 years: No If all of the above answers are "NO", then may proceed with Cephalosporin use.        Objective:  Physical Exam  Constitutional: She is oriented to person, place, and time. She appears well-developed and well-nourished. She is active and cooperative. No distress.  BP 132/70   Pulse (!) 54   Temp 98.1 F (36.7 C)   Resp 16   Ht 5\' 5"  (1.651 m)   Wt 157  lb 9.6 oz (71.5 kg)   SpO2 97%   BMI 26.23 kg/m   HENT:  Head: Normocephalic and atraumatic.  Right Ear: Hearing normal.  Left Ear: Hearing normal.  Eyes: Conjunctivae are normal. No scleral icterus.  Neck: Normal range of motion. Neck supple. No thyromegaly present.  Cardiovascular: Normal rate, regular rhythm and normal heart sounds.  Pulses:      Radial pulses are 2+ on the right side, and 2+ on the left side.  Pulmonary/Chest: Effort normal and breath sounds normal.  Lymphadenopathy:       Head (right side): No tonsillar, no preauricular, no  posterior auricular and no occipital adenopathy present.       Head (left side): No tonsillar, no preauricular, no posterior auricular and no occipital adenopathy present.    She has no cervical adenopathy.       Right: No supraclavicular adenopathy present.       Left: No supraclavicular adenopathy present.  Neurological: She is alert and oriented to person, place, and time. No sensory deficit.  Skin: Skin is warm, dry and intact. No rash noted. No cyanosis or erythema. Nails show no clubbing.  Psychiatric: She has a normal mood and affect. Her speech is normal and behavior is normal.   Wt Readings from Last 3 Encounters:  03/29/18 157 lb 9.6 oz (71.5 kg)  01/28/18 158 lb 6.4 oz (71.8 kg)  01/11/18 161 lb 6.4 oz (73.2 kg)       Assessment & Plan:   Problem List Items Addressed This Visit    Hyperlipidemia - Primary (Chronic)    Continues to tolerate atorvastatin 40 mg daily.  Continue efforts for healthy lifestyle modifications.      Relevant Orders   Lipid panel   Comprehensive metabolic panel   Prediabetes    Continue healthy lifestyle modification for risk reduction.  Update hemoglobin A1c today.      Relevant Orders   Hemoglobin A1c   Comprehensive metabolic panel   Hormone replacement therapy    Continue with Estrace 1 mg.  She is aware of potential risks associated with estrogen replacement therapy.  Currently benefits outweigh the risks.      Relevant Medications   estradiol (ESTRACE) 1 MG tablet       Return in about 6 months (around 09/28/2018) for Re-evaluate hyperlipidemia.   Fara Chute, PA-C [facility]

## 2018-03-29 NOTE — Progress Notes (Signed)
Subjective:    Patient ID: Daisy Nelson, female    DOB: 1946/08/16, 72 y.o.   MRN: [id number] Chief Complaint  Patient presents with  . Hyperlipidemia    6 month follow up     HPI  72 yo female present for evaluation of hyperlipidemia and pre-diabetes. No complaints at this time, reports feeling well.  Last A1c in October, 6.1% Diet has been consistent since last visit, less eating.  Last Lipids check (October): TG 127, LDL 62. Denies myalgias. Tolerating medications well.  Review of Systems  Denies chest pains, SOB, palpitations, fatigue, polydipsia, polyphagia, polyuria, dizziness, numbness, headaches, melena, hemamochezia, vision changes.   Patient Active Problem List   Diagnosis Date Noted  . Hormone replacement therapy 03/23/2017  . Preretinal fibrosis, right eye 08/25/2016  . Prediabetes 03/26/2015  . PAF (paroxysmal atrial fibrillation) ([place]) 07/10/2013  . Atrial fibrillation with rapid ventricular response ([place]) 02/06/2013  . Family history of coronary artery bypass surgery 02/06/2013  . GERD (gastroesophageal reflux disease) 10/25/2012  . Allergic rhinitis 10/25/2012  . Hyperlipidemia 10/25/2012  . Depression 10/25/2012  . Anxiety 10/25/2012  . MVP (mitral valve prolapse) 10/25/2012  . DJD (degenerative joint disease) of cervical spine 10/25/2012  . Nephrolithiasis 10/25/2012  . Colon polyps 10/25/2012    Past Medical History:  Diagnosis Date  . AF (atrial fibrillation) ([place])    AF with RVR 01/2013  . Allergy   . Anxiety   . Atrial fibrillation with RVR ([place]) 08/31/2015  . Colon polyps   . Complication of anesthesia 2011   colonscopy- "slow to awaken"  . Depression   . DJD (degenerative joint disease) of cervical spine   . Family history of CABG   . GERD (gastroesophageal reflux disease)   . History of kidney stones   . Hyperglycemia   . Hyperlipidemia   . Jaw pain 08/31/2015  . Pneumonia   . PONV (postoperative nausea and vomiting)      Prior to Admission medications   Medication Sig Start Date End Date Taking? Authorizing Provider  atorvastatin (LIPITOR) 40 MG tablet TAKE 1 TABLET BY MOUTH ONCE DAILY 03/14/18   Harrison Mons, PA-C  estradiol (ESTRACE) 1 MG tablet Take 1 tablet (1 mg total) by mouth daily. 03/23/17   Harrison Mons, PA-C  flecainide (TAMBOCOR) 100 MG tablet Take 2 tablets by mouth with onset of atrial fib. No more than once a week 01/18/17   Hilty, Nadean Corwin, MD  fluticasone [facility]) 50 MCG/ACT nasal spray Place 2 sprays into both nostrils daily. 03/23/17   Harrison Mons, PA-C  metoprolol tartrate (LOPRESSOR) 25 MG tablet TAKE 1/2 TABLET BY MOUTH TWICE DAILY 08/31/17   Hilty, Nadean Corwin, MD  montelukast (SINGULAIR) 10 MG tablet Take 1 tablet (10 mg total) by mouth at bedtime. 01/28/18   Harrison Mons, PA-C  ranitidine (ZANTAC) 150 MG tablet Take 150 mg by mouth every evening.     [provider]  sertraline (ZOLOFT) 100 MG tablet Take 1 tablet (100 mg total) by mouth daily. 02/28/18   Harrison Mons, PA-C  XARELTO 20 MG TABS tablet TAKE 1 TABLET BY MOUTH ONCE DAILY WITH SUPPER 11/09/17   Hilty, Nadean Corwin, MD    Allergies  Allergen Reactions  . Adhesive [Tape]     Burn skin  . Erythromycin     Gi intolerance  . Naproxen Nausea And Vomiting    Abdominal pain   . Penicillins Hives    Has patient had a PCN reaction causing immediate  rash, facial/tongue/throat swelling, SOB or lightheadedness with hypotension: UNKNOWN Has patient had a PCN reaction causing severe rash involving mucus membranes or skin necrosis: No Has patient had a PCN reaction that required hospitalization No Has patient had a PCN reaction occurring within the last 10 years: No If all of the above answers are "NO", then may proceed with Cephalosporin use.         Objective:   Physical Exam  Constitutional: She is oriented to person, place, and time. She appears well-developed and well-nourished. No distress.  BP 132/70    Pulse (!) 54   Temp 98.1 F (36.7 C)   Resp 16   Ht 5\' 5"  (1.651 m)   Wt 157 lb 9.6 oz (71.5 kg)   SpO2 97%   BMI 26.23 kg/m    HENT:  Head: Normocephalic and atraumatic.  Eyes: Conjunctivae and EOM are normal. Right eye exhibits no discharge. Left eye exhibits no discharge. No scleral icterus.  Neck: Normal range of motion. Neck supple. No tracheal deviation present. No thyromegaly present.  Cardiovascular: Normal rate, regular rhythm, normal heart sounds and intact distal pulses. Exam reveals no gallop and no friction rub.  No murmur heard. Pulmonary/Chest: Effort normal and breath sounds normal. No respiratory distress. She has no wheezes. She has no rales.  Musculoskeletal: Normal range of motion. She exhibits no edema or deformity.  Lymphadenopathy:    She has no cervical adenopathy.  Neurological: She is alert and oriented to person, place, and time.  Skin: Skin is warm and dry. She is not diaphoretic. No erythema.  Psychiatric: She has a normal mood and affect. Her behavior is normal.    Wt Readings from Last 3 Encounters:  03/29/18 157 lb 9.6 oz (71.5 kg)  01/28/18 158 lb 6.4 oz (71.8 kg)  01/11/18 161 lb 6.4 oz (73.2 kg)        Assessment & Plan:  1. Hyperlipidemia, unspecified hyperlipidemia type Await labs and f/u  - Lipid panel - Comprehensive metabolic panel  2. Prediabetes Await labs and f/u If A1c >6.5%, start metformin. - Hemoglobin A1c - Comprehensive metabolic panel  Return in about 6 months (around 09/28/2018) for Re-evaluate hyperlipidemia.

## 2018-03-29 NOTE — Assessment & Plan Note (Signed)
Continues to tolerate atorvastatin 40 mg daily.  Continue efforts for healthy lifestyle modifications.

## 2018-03-29 NOTE — Assessment & Plan Note (Signed)
Continue healthy lifestyle modification for risk reduction.  Update hemoglobin A1c today.

## 2018-03-30 LAB — COMPREHENSIVE METABOLIC PANEL
ALT: 26 IU/L (ref 0–32)
AST: 24 IU/L (ref 0–40)
Albumin/Globulin Ratio: 1.4 (ref 1.2–2.2)
Albumin: 3.8 g/dL (ref 3.5–4.8)
Alkaline Phosphatase: 104 IU/L (ref 39–117)
BUN/Creatinine Ratio: 13 (ref 12–28)
BUN: 11 mg/dL (ref 8–27)
Bilirubin Total: 0.3 mg/dL (ref 0.0–1.2)
CO2: 24 mmol/L (ref 20–29)
Calcium: 9.1 mg/dL (ref 8.7–10.3)
Chloride: 103 mmol/L (ref 96–106)
Creatinine, Ser: 0.84 mg/dL (ref 0.57–1.00)
GFR calc Af Amer: 81 mL/min/{1.73_m2} (ref >59)
GFR calc non Af Amer: 71 mL/min/{1.73_m2} (ref >59)
Globulin, Total: 2.7 g/dL (ref 1.5–4.5)
Glucose: 116 mg/dL — ABNORMAL HIGH (ref 65–99)
Potassium: 4.8 mmol/L (ref 3.5–5.2)
Sodium: 141 mmol/L (ref 134–144)
Total Protein: 6.5 g/dL (ref 6.0–8.5)

## 2018-03-30 LAB — HEMOGLOBIN A1C
Est. average glucose Bld gHb Est-mCnc: 137 mg/dL
Hgb A1c MFr Bld: 6.4 % — ABNORMAL HIGH (ref 4.8–5.6)

## 2018-03-30 LAB — LIPID PANEL
Chol/HDL Ratio: 2.3 ratio (ref 0.0–4.4)
Cholesterol, Total: 135 mg/dL (ref 100–199)
HDL: 58 mg/dL (ref >39)
LDL Calculated: 55 mg/dL (ref 0–99)
Triglycerides: 109 mg/dL (ref 0–149)
VLDL Cholesterol Cal: 22 mg/dL (ref 5–40)

## 2018-05-02 ENCOUNTER — Encounter: Payer: Self-pay | Admitting: Internal Medicine

## 2018-05-02 MED ORDER — METOPROLOL TARTRATE 25 MG PO TABS: 12.5000 mg | ORAL_TABLET | Freq: Two times a day (BID) | ORAL | 2 refills | Status: DC

## 2018-05-09 ENCOUNTER — Other Ambulatory Visit: Payer: Self-pay | Admitting: Internal Medicine

## 2018-05-10 ENCOUNTER — Encounter: Payer: Self-pay | Admitting: Family Medicine

## 2018-05-10 ENCOUNTER — Ambulatory Visit (INDEPENDENT_AMBULATORY_CARE_PROVIDER_SITE_OTHER): Payer: PPO | Admitting: Family Medicine

## 2018-05-10 VITALS — BP 128/76 | HR 50 | Ht 65.0 in | Wt 155.0 lb

## 2018-05-10 DIAGNOSIS — E785 Hyperlipidemia, unspecified: Secondary | ICD-10-CM | POA: Diagnosis not present

## 2018-05-10 DIAGNOSIS — Z87891 Personal history of nicotine dependence: Secondary | ICD-10-CM | POA: Diagnosis not present

## 2018-05-10 DIAGNOSIS — F419 Anxiety disorder, unspecified: Secondary | ICD-10-CM | POA: Diagnosis not present

## 2018-05-10 DIAGNOSIS — F329 Major depressive disorder, single episode, unspecified: Secondary | ICD-10-CM | POA: Diagnosis not present

## 2018-05-10 DIAGNOSIS — R7303 Prediabetes: Secondary | ICD-10-CM | POA: Diagnosis not present

## 2018-05-10 DIAGNOSIS — R001 Bradycardia, unspecified: Secondary | ICD-10-CM | POA: Insufficient documentation

## 2018-05-10 DIAGNOSIS — T50905A Adverse effect of unspecified drugs, medicaments and biological substances, initial encounter: Secondary | ICD-10-CM

## 2018-05-10 DIAGNOSIS — T4145XS Adverse effect of unspecified anesthetic, sequela: Secondary | ICD-10-CM

## 2018-05-10 DIAGNOSIS — I4891 Unspecified atrial fibrillation: Secondary | ICD-10-CM | POA: Diagnosis not present

## 2018-05-10 DIAGNOSIS — Z79890 Hormone replacement therapy (postmenopausal): Secondary | ICD-10-CM | POA: Diagnosis not present

## 2018-05-10 DIAGNOSIS — F39 Unspecified mood [affective] disorder: Secondary | ICD-10-CM | POA: Insufficient documentation

## 2018-05-10 DIAGNOSIS — F32A Depression, unspecified: Secondary | ICD-10-CM

## 2018-05-10 DIAGNOSIS — T8859XS Other complications of anesthesia, sequela: Secondary | ICD-10-CM

## 2018-05-10 NOTE — Patient Instructions (Addendum)
We will recheck your A1c end of July so please come in for non-fasting blood work, then [place] with me in 3 to 5 days later to discuss your A1c etc.   Risk factors for prediabetes and type 2 diabetes  Researchers don't fully understand why some people develop prediabetes and type 2 diabetes and others don't.  It's clear that certain factors increase the risk, however, including:  Weight. The more fatty tissue you have, the more resistant your cells become to insulin.  Inactivity. The less active you are, the greater your risk. Physical activity helps you control your weight, uses up glucose as energy and makes your cells more sensitive to insulin.  Family history. Your risk increases if a parent or sibling has type 2 diabetes.  Race. Although it's unclear why, people of certain races - including blacks, Hispanics, American Indians and Asian-Americans - are at higher risk.  Age. Your risk increases as you get older. This may be because you tend to exercise less, lose muscle mass and gain weight as you age. But type 2 diabetes is also increasing dramatically among children, adolescents and younger adults.  Gestational diabetes. If you developed gestational diabetes when you were pregnant, your risk of developing prediabetes and type 2 diabetes later increases. If you gave birth to a baby weighing more than 9 pounds (4 kilograms), you're also at risk of type 2 diabetes.  Polycystic ovary syndrome. For women, having polycystic ovary syndrome - a common condition characterized by irregular menstrual periods, excess hair growth and obesity - increases the risk of diabetes.  High blood pressure. Having blood pressure over 140/90 millimeters of mercury (mm Hg) is linked to an increased risk of type 2 diabetes.  Abnormal cholesterol and triglyceride levels. If you have low levels of high-density lipoprotein (HDL), or "good," cholesterol, your risk of type 2 diabetes is higher. Triglycerides are another type of  fat carried in the blood. People with high levels of triglycerides have an increased risk of type 2 diabetes. Your doctor can let you know what your cholesterol and triglyceride levels are.  A good guide to good carbs: The glycemic index ---If you have diabetes, or at risk for diabetes, you know all too well that when you eat carbohydrates, your blood sugar goes up. The total amount of carbs you consume at a meal or in a snack mostly determines what your blood sugar will do. But the food itself also plays a role. A serving of white rice has almost the same effect as eating pure table sugar - a quick, high spike in blood sugar. A serving of lentils has a slower, smaller effect.  ---Picking good sources of carbs can help you control your blood sugar and your weight. Even if you don't have diabetes, eating healthier carbohydrate-rich foods can help ward off a host of chronic conditions, from heart disease to various cancers to, well, diabetes.  ---One way to choose foods is with the glycemic index (GI). This tool measures how much a food boosts blood sugar.  The glycemic index rates the effect of a specific amount of a food on blood sugar compared with the same amount of pure glucose. A food with a glycemic index of 28 boosts blood sugar only 28% as much as pure glucose. One with a GI of 95 acts like pure glucose.    High glycemic foods result in a quick spike in insulin and blood sugar (also known as blood glucose).  Low glycemic foods have a  slower, smaller effect- these are healthier for you.   Using the glycemic index Using the glycemic index is easy: choose foods in the low GI category instead of those in the high GI category (see below), and go easy on those in between. Low glycemic index (GI of 55 or less): Most fruits and vegetables, beans, minimally processed grains, pasta, low-fat dairy foods, and nuts.  Moderate glycemic index (GI 56 to 69): White and sweet potatoes, corn, white rice,  couscous, breakfast cereals such as Cream of Wheat and Mini Wheats.  High glycemic index (GI of 70 or higher): White bread, rice cakes, most crackers, bagels, cakes, doughnuts, croissants, most packaged breakfast cereals. You can see the values for 100 commons foods and get links to more at www.[email or web address].si.  Swaps for lowering glycemic index  Instead of this high-glycemic index food Eat this lower-glycemic index food  White rice Brown rice or converted rice  Instant oatmeal Steel-cut oats  Cornflakes Bran flakes  Baked potato Pasta, bulgur  White bread Whole-grain bread  Corn Peas or leafy greens       Prediabetes Eating Plan  Prediabetes--also called impaired glucose tolerance or impaired fasting glucose--is a condition that causes blood sugar (blood glucose) levels to be higher than normal. Following a healthy diet can help to keep prediabetes under control. It can also help to lower the risk of type 2 diabetes and heart disease, which are increased in people who have prediabetes. Along with regular exercise, a healthy diet:  Promotes weight loss.  Helps to control blood sugar levels.  Helps to improve the way that the body uses insulin.   WHAT DO I NEED TO KNOW ABOUT THIS EATING PLAN?   Use the glycemic index (GI) to plan your meals. The index tells you how quickly a food will raise your blood sugar. Choose low-GI foods. These foods take a longer time to raise blood sugar.  Pay close attention to the amount of carbohydrates in the food that you eat. Carbohydrates increase blood sugar levels.  Keep track of how many calories you take in. Eating the right amount of calories will help you to achieve a healthy weight. Losing about 7 percent of your starting weight can help to prevent type 2 diabetes.  You may want to follow a Mediterranean diet. This diet includes a lot of vegetables, lean meats or fish, whole grains, fruits, and healthy oils and fats.   WHAT  FOODS CAN I EAT?  Grains Whole grains, such as whole-wheat or whole-grain breads, crackers, cereals, and pasta. Unsweetened oatmeal. Bulgur. Barley. Quinoa. Brown rice. Corn or whole-wheat flour tortillas or taco shells. Vegetables Lettuce. Spinach. Peas. Beets. Cauliflower. Cabbage. Broccoli. Carrots. Tomatoes. Squash. Eggplant. Herbs. Peppers. Onions. Cucumbers. Brussels sprouts. Fruits Berries. Bananas. Apples. Oranges. Grapes. Papaya. Mango. Pomegranate. Kiwi. Grapefruit. Cherries. Meats and Other Protein Sources Seafood. Lean meats, such as chicken and [place] or lean cuts of pork and beef. Tofu. Eggs. Nuts. Beans. Dairy Low-fat or fat-free dairy products, such as yogurt, cottage cheese, and cheese. Beverages Water. Tea. Coffee. Sugar-free or diet soda. Seltzer water. Milk. Milk alternatives, such as soy or almond milk. Condiments Mustard. Relish. Low-fat, low-sugar ketchup. Low-fat, low-sugar barbecue sauce. Low-fat or fat-free mayonnaise. Sweets and Desserts Sugar-free or low-fat pudding. Sugar-free or low-fat ice cream and other frozen treats. Fats and Oils Avocado. Walnuts. Olive oil. The items listed above may not be a complete list of recommended foods or beverages. Contact your dietitian for more options.    WHAT  FOODS ARE NOT RECOMMENDED?  Grains Refined white flour and flour products, such as bread, pasta, snack foods, and cereals. Beverages Sweetened drinks, such as sweet iced tea and soda. Sweets and Desserts Baked goods, such as cake, cupcakes, pastries, cookies, and cheesecake. The items listed above may not be a complete list of foods and beverages to avoid. Contact your dietitian for more information.   This information is not intended to replace advice given to you by your health care provider. Make sure you discuss any questions you have with your health care provider.   Document Released: 04/02/2015 Document Reviewed: 04/02/2015 Elsevier Interactive Patient  Education 2016 [employer].   Please realize, EXERCISE IS MEDICINE!  -  American Heart Association ( AHA) guidelines for exercise : If you are in good health, without any medical conditions, you should engage in 150 minutes of moderate intensity aerobic activity per week.  This means you should be huffing and puffing throughout your workout.   Engaging in regular exercise will improve brain function and memory, as well as improve mood, boost immune system and help with weight management.  As well as the other, more well-known effects of exercise such as decreasing blood sugar levels, decreasing blood pressure,  and decreasing bad cholesterol levels/ increasing good cholesterol levels.     -  The AHA strongly endorses consumption of a diet that contains a variety of foods from all the food categories with an emphasis on fruits and vegetables; fat-free and low-fat dairy products; cereal and grain products; legumes and nuts; and fish, poultry, and/or extra lean meats.    Excessive food intake, especially of foods high in saturated and trans fats, sugar, and salt, should be avoided.    Adequate water intake of roughly 1/2 of your weight in pounds, should equal the ounces of water per day you should drink.  So for instance, if you're 200 pounds, that would be 100 ounces of water per day.         Mediterranean Diet  Why follow it? Research shows. . Those who follow the Mediterranean diet have a reduced risk of heart disease  . The diet is associated with a reduced incidence of Parkinson's and Alzheimer's diseases . People following the diet may have longer life expectancies and lower rates of chronic diseases  . The Dietary Guidelines for Americans recommends the Mediterranean diet as an eating plan to promote health and prevent disease  What Is the Mediterranean Diet?  . Healthy eating plan based on typical foods and recipes of Mediterranean-style cooking . The diet is primarily a plant based  diet; these foods should make up a majority of meals   Starches - Plant based foods should make up a majority of meals - They are an important sources of vitamins, minerals, energy, antioxidants, and fiber - Choose whole grains, foods high in fiber and minimally processed items  - Typical grain sources include wheat, oats, barley, corn, brown rice, bulgar, farro, millet, polenta, couscous  - Various types of beans include chickpeas, lentils, fava beans, black beans, white beans   Fruits  Veggies - Large quantities of antioxidant rich fruits & veggies; 6 or more servings  - Vegetables can be eaten raw or lightly drizzled with oil and cooked  - Vegetables common to the traditional Mediterranean Diet include: artichokes, arugula, beets, broccoli, brussel sprouts, cabbage, carrots, celery, collard greens, cucumbers, eggplant, kale, leeks, lemons, lettuce, mushrooms, okra, onions, peas, peppers, potatoes, pumpkin, radishes, rutabaga, shallots, spinach, sweet potatoes, turnips,  zucchini - Fruits common to the Mediterranean Diet include: apples, apricots, avocados, cherries, clementines, dates, figs, grapefruits, grapes, melons, nectarines, oranges, peaches, pears, pomegranates, strawberries, tangerines  Fats - Replace butter and margarine with healthy oils, such as olive oil, canola oil, and tahini  - Limit nuts to no more than a handful a day  - Nuts include walnuts, almonds, pecans, pistachios, pine nuts  - Limit or avoid candied, honey roasted or heavily salted nuts - Olives are central to the [employer] - can be eaten whole or used in a variety of dishes   Meats Protein - Limiting red meat: no more than a few times a month - When eating red meat: choose lean cuts and keep the portion to the size of deck of cards - Eggs: approx. 0 to 4 times a week  - Fish and lean poultry: at least 2 a week  - Healthy protein sources include, chicken, [place], lean beef, lamb - Increase intake of seafood  such as tuna, salmon, trout, mackerel, shrimp, scallops - Avoid or limit high fat processed meats such as sausage and bacon  Dairy - Include moderate amounts of low fat dairy products  - Focus on healthy dairy such as fat free yogurt, skim milk, low or reduced fat cheese - Limit dairy products higher in fat such as whole or 2% milk, cheese, ice cream  Alcohol - Moderate amounts of red wine is ok  - No more than 5 oz daily for women (all ages) and men older than age 53  - No more than 10 oz of wine daily for men younger than 74  Other - Limit sweets and other desserts  - Use herbs and spices instead of salt to flavor foods  - Herbs and spices common to the traditional Mediterranean Diet include: basil, bay leaves, chives, cloves, cumin, fennel, garlic, lavender, marjoram, mint, oregano, parsley, pepper, rosemary, sage, savory, sumac, tarragon, thyme   It's not just a diet, it's a lifestyle:  . The Mediterranean diet includes lifestyle factors typical of those in the region  . Foods, drinks and meals are best eaten with others and savored . Daily physical activity is important for overall good health . This could be strenuous exercise like running and aerobics . This could also be more leisurely activities such as walking, housework, yard-work, or taking the stairs . Moderation is the key; a balanced and healthy diet accommodates most foods and drinks . Consider portion sizes and frequency of consumption of certain foods   Meal Ideas & Options:  . Breakfast:  o Whole wheat toast or whole wheat English muffins with peanut butter & hard boiled egg o Steel cut oats topped with apples & cinnamon and skim milk  o Fresh fruit: banana, strawberries, melon, berries, peaches  o Smoothies: strawberries, bananas, greek yogurt, peanut butter o Low fat greek yogurt with blueberries and granola  o Egg white omelet with spinach and mushrooms o Breakfast couscous: whole wheat couscous, apricots, skim  milk, cranberries  . Sandwiches:  o Hummus and grilled vegetables (peppers, zucchini, squash) on whole wheat bread   o Grilled chicken on whole wheat pita with lettuce, tomatoes, cucumbers or tzatziki  o Tuna salad on whole wheat bread: tuna salad made with greek yogurt, olives, red peppers, capers, green onions o Garlic rosemary lamb pita: lamb sauted with garlic, rosemary, salt & pepper; add lettuce, cucumber, greek yogurt to pita - flavor with lemon juice and black pepper  . Seafood:  o  Mediterranean grilled salmon, seasoned with garlic, basil, parsley, lemon juice and black pepper o Shrimp, lemon, and spinach whole-grain pasta salad made with low fat greek yogurt  o Seared scallops with lemon orzo  o Seared tuna steaks seasoned salt, pepper, coriander topped with tomato mixture of olives, tomatoes, olive oil, minced garlic, parsley, green onions and cappers  . Meats:  o Herbed greek chicken salad with kalamata olives, cucumber, feta  o Red bell peppers stuffed with spinach, bulgur, lean ground beef (or lentils) & topped with feta   o Kebabs: skewers of chicken, tomatoes, onions, zucchini, squash  o [place] burgers: made with red onions, mint, dill, lemon juice, feta cheese topped with roasted red peppers . Vegetarian o Cucumber salad: cucumbers, artichoke hearts, celery, red onion, feta cheese, tossed in olive oil & lemon juice  o Hummus and whole grain pita points with a greek salad (lettuce, tomato, feta, olives, cucumbers, red onion) o Lentil soup with celery, carrots made with vegetable broth, garlic, salt and pepper  o Tabouli salad: parsley, bulgur, mint, scallions, cucumbers, tomato, radishes, lemon juice, olive oil, salt and pepper.

## 2018-05-10 NOTE — Progress Notes (Signed)
New patient office visit note:  Impression and Recommendations:    1. Prediabetes   2. Hyperlipidemia, unspecified hyperlipidemia type   3. Anxiety   4. Atrial fibrillation with rapid ventricular response ([place])   5. Drug-induced bradycardia-  rate controlled on metoprolol by cards, patient ASx   6. Hormone replacement therapy     Education and routine counseling performed. Handouts provided.  1. Cardiology - Atrial Fibrillation - Patient is asymptomatic at this time. - Encouraged patient to continue to follow up with Dr. Debara Pickett for management. - Continue to obtain medication and supervision through cardiology.  2. OBGYN/ Hormone Replacement - Patient would like to taper off of estradiol. - Advised patient to taper slowly  - Will continue to monitor patient during this time.  3. Seasonal Allergies - Continue on Singulair during periods of high allergic activity. - If desired, patient may discontinue this when allergies are "out of season."  4. Mood - Stable at this time.  Continue Zoloft as prescribed. - Will continue to monitor.  5. Pre-Diabetic - A1c of 6.4 in April 2019. - Education and handouts provided to patient about pre-DM diet and management.  6 Hyperlipidemia - Stable at this time.  Continue Lipitor as prescribed. - Will continue to monitor.  7. Exercise & Lifestyle - Advised patient to continue working toward exercising to improve health.  Encouraged exercise for the betterment of health, including mind, body, and bone integrity.  - Patient will begin with 15 minutes of activity daily.  Start off slow, walking on flat ground.  - Recommended that the patient eventually strive for at least 150 minutes of moderate cardiovascular activity per week according to guidelines established by the [facility].   - Encouraged patient to look into the availability of Silver Sneakers.  - Healthy dietary habits encouraged, including low-carb, and high amounts of lean  protein in diet.   - Patient should also consume adequate amounts of water - half of body weight in oz of water per day.  8. Follow-Up - Patient is due for re-check A1c around early August.   - Return for follow-up in 3-4 months.  Come in 2-3 days prior for fasting blood work.  - Return for CPE and chronic follow-up as scheduled.  Gross side effects, risk and benefits, and alternatives of medications discussed with patient.  Patient is aware that all medications have potential side effects and we are unable to predict every side effect or drug-drug interaction that may occur.  Expresses verbal understanding and consents to current therapy plan and treatment regimen.  Return for [place] work then [place] with me 3-5 days later. (I asked CMA to please put in routine FBW orders for future)  Please see AVS handed out to patient at the end of our visit for further patient instructions/ counseling done pertaining to today's office visit.    Note: This document was prepared using Dragon voice recognition software and may include unintentional dictation errors.  This document serves as a record of services personally performed by Mellody Dance, DO. It was created on her behalf by Toni Amend, a trained medical scribe. The creation of this record is based on the scribe's personal observations and the provider's statements to them.   I have reviewed the above medical documentation for accuracy and completeness and I concur.  Mellody Dance 05/11/18 9:16 PM    ----------------------------------------------------------------------------------------------------------------------    Subjective:    Chief complaint:   Chief Complaint  Patient  presents with  . Establish Care    HPI: Daisy Nelson is a pleasant 72 y.o. female who presents to [place] [facility] today to review their medical history with me and establish care.   I asked the patient to  review their chronic problem list with me to ensure everything was updated and accurate.    All recent office visits with other providers, any medical records that patient brought in etc  - I reviewed today.     We asked pt to get [place] their medical records from [facility] providers/ specialists that they had seen within the past 3-5 years- if they are in private practice and/or do not work for [employer], [facility], [place], [place] or [employer] owned practice.  Told them to call their specialists to clarify this if they are not sure.    Patient has been going to [place], seeing provider [employer] (PA-C); Chelle is moving to [place], which the patient notes will not be covered under her insurance.   Social History Retired; worked in [employer] for 50 years. Notes that she performed a wide range of jobs. 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  Patient has been retired for 2 years. In her free time, she's doing whatever she wants to do whenever the mood strikes her.  Notes she performs housecleaning, laundry, and has a cat and a husband.  Has a 63 year old child, and 2 grandchildren, a girl and a boy.  - EtOH Use No alcohol use.  - Tobacco Use Smoked for roughly 30 years at <1ppd.  Patient estimates it at a half ppd.   Husband claims "you were never really a true smoker" because she smoked so little.  No drug use.  - Exercise Habits Patient does not exercise.  Believes that her diet is fair.  Notes she does eat fast food off and on.  "We cook sometimes, but we're not on any set thing." Believes they eat 50/50 out and cook in.   Family History Father had MI in his early 35's. Adult onset diabetes.  Father is still alive at age 61.  Mom was age 38 when she passed of a stroke. Healthy otherwise. In the last 3-4 years of her life, several health issues occurred.   Surgical  History Past Surgical History:  Procedure Laterality Date  . [place] VITRECTOMY WITH 20 GAUGE MVR PORT FOR MACULAR HOLE Right 08/25/2016   Procedure: 25 GAUGE PARS PLANA VITRECTOMY; RIGHT EYE;  Surgeon: [name], MD;  Location: [place];  Service: Ophthalmology;  Laterality: Right;  . ABDOMINAL HYSTERECTOMY  1991  . CATARACT EXTRACTION W/ INTRAOCULAR LENS  IMPLANT, BILATERAL Bilateral 03/2013  . CESAREAN SECTION  1976  . COLONOSCOPY  2011   polyps  . EYE SURGERY    . GANGLION CYST EXCISION Left    hand  . GAS/FLUID EXCHANGE Right 08/25/2016   Procedure: GAS/FLUID EXCHANGE RIGHT EYE;  Surgeon: [name], MD;  Location: [place];  Service: Ophthalmology;  Laterality: Right;  . IRIDECTOMY Right 08/25/2016   Procedure: PERIPHERAL IRIDECTOMY RIGHT EYE;  Surgeon: [name], MD;  Location: [place];  Service: Ophthalmology;  Laterality: Right;  . LAPAROSCOPY  1976  . MEMBRANE PEEL Right 08/25/2016   Procedure: MEMBRANE PEEL RIGHT EYE;  Surgeon: [name], MD;  Location: [place];  Service: Ophthalmology;  Laterality: Right;  . NM MYOCAR PERF WALL MOTION  01/2013   lexiscan - no pharmacologically induced ischemia, fixed defects in apical segments of anterior septum & inferior lateral wall   . PARS PLANA VITRECTOMY Right 08/25/2016   laser, gas injection, AC wash out. Membrane peel right eye  . TRANSTHORACIC ECHOCARDIOGRAM  01/2013   EF 55-60%, mod conc LVH; mild MR with mildly thickened leaflets; mild TR; PA peak pressure 23mmHg  . [place]    Past Medical History  Patient sees no other specialists aside from cardiology.  - Atrial Fibrillation Follows up with cardiology - Dr. Debara Pickett.  States that first episode was in 2014. It occurred in the middle of the night, and felt like someone hit her in the jaw. Notes that she woke up and felt like something "wasn't right" in her chest. They went to the ER; notes everything was clear, no blockages in the  heart.  Patient hasn't had an episode in several months. Notes "I don't deal with it frequently, but I am on two medications:" metoprolol and xarelto.  Patient notes that she is asymptomatic.  Denies swelling in her legs, chest pain, dizziness, SOB, new visual changes.  - Cholesterol Cholesterol has a history of "being a little high." She takes Lipitor nightly.  - Pre-diabetes Patient notes that her most recent A1c was 6.4.  States that is he is trying to manage her prediabetes.  - Mood Went off of medication for several years.  Noticed "I just couldn't get myself to calm down."  Resumed medication last year - was prescribed 100 mg of sertraline by past PCP.  Has been battling anxiety for many years.  - OBGYN Pap smears were provided by Chelle (former provider) in the past.  Patient takes estradiol for "hormone replacement stuff" (per patient).  She notes that she does go for mammograms every year.  - GERD Patient takes Zantac every day to manage acid reflux.  - Seasonal Allergies Notes she was recently switched to Singulair; takes this every night. States "I'm not sure if I need to take it every night," but does this per the label.  Does not take allegra or claritin at all.   Wt Readings from Last 3 Encounters:  05/10/18 155 lb (70.3 kg)  03/29/18 157 lb 9.6 oz (71.5 kg)  01/28/18 158 lb 6.4 oz (71.8 kg)   BP Readings from Last 3 Encounters:  05/10/18 128/76  03/29/18 132/70  01/28/18 (!) 118/56   Pulse Readings from Last 3 Encounters:  05/10/18 (!) 50  03/29/18 (!) 54  01/28/18 (!) 57   BMI Readings from Last 3 Encounters:  05/10/18 25.79 kg/m  03/29/18 26.23 kg/m  01/28/18 26.36 kg/m    Patient Care Team    Relationship Specialty Notifications Start End  Mellody Dance, DO PCP - General Family Medicine  05/10/18   Pixie Casino, MD Consulting Physician Cardiology  11/14/13   Vevelyn Royals, MD Consulting Physician Ophthalmology  07/18/16    [name], MD Consulting Physician Ophthalmology  07/18/16   Carol Ada, MD Consulting Physician Gastroenterology  09/22/16     Patient Active Problem List   Diagnosis Date Noted  . Drug-induced bradycardia-  rate controlled on metoprolol by cards, patient ASx 05/10/2018  . Hormone replacement therapy 03/23/2017  . Preretinal fibrosis, right eye 08/25/2016  . Prediabetes 03/26/2015  . PAF (paroxysmal atrial fibrillation) ([place]) 07/10/2013  . Atrial fibrillation with rapid ventricular response ([place]) 02/06/2013  . Family history  of coronary artery bypass surgery 02/06/2013  . GERD (gastroesophageal reflux disease) 10/25/2012  . Allergic rhinitis 10/25/2012  . Hyperlipidemia 10/25/2012  . Anxiety 10/25/2012  . DJD (degenerative joint disease) of cervical spine 10/25/2012  . Nephrolithiasis 10/25/2012  . Colon polyps 10/25/2012     Past Medical History:  Diagnosis Date  . AF (atrial fibrillation) ([place])    AF with RVR 01/2013  . Allergy   . Anxiety   . Atrial fibrillation with RVR ([place]) 08/31/2015  . Colon polyps   . Complication of anesthesia 2011   colonscopy- "slow to awaken"  . Depression   . DJD (degenerative joint disease) of cervical spine   . Family history of CABG   . GERD (gastroesophageal reflux disease)   . History of kidney stones   . Hyperglycemia   . Hyperlipidemia   . Jaw pain 08/31/2015  . Pneumonia   . PONV (postoperative nausea and vomiting)      Past Medical History:  Diagnosis Date  . AF (atrial fibrillation) ([place])    AF with RVR 01/2013  . Allergy   . Anxiety   . Atrial fibrillation with RVR ([place]) 08/31/2015  . Colon polyps   . Complication of anesthesia 2011   colonscopy- "slow to awaken"  . Depression   . DJD (degenerative joint disease) of cervical spine   . Family history of CABG   . GERD (gastroesophageal reflux disease)   . History of kidney stones   . Hyperglycemia   . Hyperlipidemia   . Jaw pain 08/31/2015  . Pneumonia   .  PONV (postoperative nausea and vomiting)      Past Surgical History:  Procedure Laterality Date  . [place] VITRECTOMY WITH 20 GAUGE MVR PORT FOR MACULAR HOLE Right 08/25/2016   Procedure: 25 GAUGE PARS PLANA VITRECTOMY; RIGHT EYE;  Surgeon: [name], MD;  Location: [place];  Service: Ophthalmology;  Laterality: Right;  . ABDOMINAL HYSTERECTOMY  1991  . CATARACT EXTRACTION W/ INTRAOCULAR LENS  IMPLANT, BILATERAL Bilateral 03/2013  . CESAREAN SECTION  1976  . COLONOSCOPY  2011   polyps  . EYE SURGERY    . GANGLION CYST EXCISION Left    hand  . GAS/FLUID EXCHANGE Right 08/25/2016   Procedure: GAS/FLUID EXCHANGE RIGHT EYE;  Surgeon: [name], MD;  Location: [place];  Service: Ophthalmology;  Laterality: Right;  . IRIDECTOMY Right 08/25/2016   Procedure: PERIPHERAL IRIDECTOMY RIGHT EYE;  Surgeon: [name], MD;  Location: [place];  Service: Ophthalmology;  Laterality: Right;  . LAPAROSCOPY  1976  . MEMBRANE PEEL Right 08/25/2016   Procedure: MEMBRANE PEEL RIGHT EYE;  Surgeon: [name], MD;  Location: [place];  Service: Ophthalmology;  Laterality: Right;  . NM MYOCAR PERF WALL MOTION  01/2013   lexiscan - no pharmacologically induced ischemia, fixed defects in apical segments of anterior septum & inferior lateral wall   . PARS PLANA VITRECTOMY Right 08/25/2016   laser, gas injection, AC wash out. Membrane peel right eye  . TRANSTHORACIC ECHOCARDIOGRAM  01/2013   EF 55-60%, mod conc LVH; mild MR with mildly thickened leaflets; mild TR; PA peak pressure 50mmHg  . TUBAL LIGATION  1977     Family History  Problem Relation Age of Onset  . Stroke Mother   . Diabetes Father   . Heart disease Father        CABG  . Arthritis Father        C6-7  .  Skin cancer Father        SCC  . Arthritis Brother        s/p THR     Social History   Substance and Sexual Activity  Drug Use No     Social History   Substance and Sexual Activity  Alcohol Use No      Social History   Tobacco Use  Smoking Status Former Smoker  . Packs/day: 0.50  . Years: 36.00  . Pack years: 18.00  . Types: Cigarettes  . Start date: 03/15/1963  . Last attempt to quit: 01/08/2005  . Years since quitting: 13.3  Smokeless Tobacco Never Used     Current Meds  Medication Sig  . atorvastatin (LIPITOR) 40 MG tablet TAKE 1 TABLET BY MOUTH ONCE DAILY  . estradiol (ESTRACE) 1 MG tablet Take 1 tablet (1 mg total) by mouth daily.  Marland Kitchen Fexofenadine HCl (ALLEGRA ALLERGY PO) Take 1 tablet by mouth as needed.  . flecainide (TAMBOCOR) 100 MG tablet Take 2 tablets by mouth with onset of atrial fib. No more than once a week  . fluticasone (FLONASE) 50 MCG/ACT nasal spray Place 2 sprays into both nostrils daily.  Marland Kitchen loratadine (CLARITIN) 10 MG tablet Take 10 mg by mouth as needed for allergies.  . metoprolol tartrate (LOPRESSOR) 25 MG tablet Take 0.5 tablets (12.5 mg total) by mouth 2 (two) times daily.  . ranitidine (ZANTAC) 150 MG tablet Take 150 mg by mouth every evening.   Alveda Reasons 20 MG TABS tablet TAKE 1 TABLET BY MOUTH ONCE DAILY WITH SUPPER    Allergies: Adhesive [tape]; Erythromycin; Naproxen; and Penicillins   Review of Systems  Constitutional: Negative for chills, diaphoresis, fever, malaise/fatigue and weight loss.  HENT: Negative for congestion, sore throat and tinnitus.   Eyes: Negative for blurred vision, double vision and photophobia.  Respiratory: Negative for cough and wheezing.   Cardiovascular: Negative for chest pain and palpitations.  Gastrointestinal: Negative for blood in stool, diarrhea, nausea and vomiting.  Genitourinary: Negative for dysuria, frequency and urgency.  Musculoskeletal: Negative for joint pain and myalgias.  Skin: Negative for itching and rash.  Neurological: Negative for dizziness, focal weakness, weakness and headaches.  Endo/Heme/Allergies: Negative for environmental allergies and polydipsia. Does not bruise/bleed easily.   Psychiatric/Behavioral: Negative for depression and memory loss. The patient is not nervous/anxious and does not have insomnia.      Objective:   Blood pressure 128/76, pulse (!) 50, height 5\' 5"  (1.651 m), weight 155 lb (70.3 kg), SpO2 98 %. Body mass index is 25.79 kg/m. General: Well Developed, well nourished, and in no acute distress.  Neuro: Alert and oriented x3, extra-ocular muscles intact, sensation grossly intact.  HEENT:[place]/AT, PERRLA, neck supple, No carotid bruits Skin: no gross rashes  Cardiac:  Regular rhythm and rate bradycardic Respiratory: Essentially clear to auscultation bilaterally. Not using accessory muscles, speaking in full sentences.  Abdominal: not grossly distended Musculoskeletal: Ambulates w/o diff, FROM * 4 ext.  Vasc: less 2 sec cap RF, warm and pink  Psych:  No HI/SI, judgement and insight good, Euthymic mood. Full Affect.    Recent Results (from the past 2160 hour(s))  Lipid panel     Status: None   Collection Time: 03/29/18  9:04 AM  Result Value Ref Range   Cholesterol, Total 135 100 - 199 mg/dL   Triglycerides 109 0 - 149 mg/dL   HDL 58 >39 mg/dL   VLDL Cholesterol Cal 22 5 - 40 mg/dL   LDL  Calculated 55 0 - 99 mg/dL   Chol/HDL Ratio 2.3 0.0 - 4.4 ratio    Comment:                                   T. Chol/HDL Ratio                                             Men  Women                               1/2 Avg.Risk  3.4    3.3                                   Avg.Risk  5.0    4.4                                2X Avg.Risk  9.6    7.1                                3X Avg.Risk 23.4   11.0   Hemoglobin A1c     Status: Abnormal   Collection Time: 03/29/18  9:04 AM  Result Value Ref Range   Hgb A1c MFr Bld 6.4 (H) 4.8 - 5.6 %    Comment:          Prediabetes: 5.7 - 6.4          Diabetes: >6.4          Glycemic control for adults with diabetes: <7.0    Est. average glucose Bld gHb Est-mCnc 137 mg/dL  Comprehensive metabolic panel      Status: Abnormal   Collection Time: 03/29/18  9:04 AM  Result Value Ref Range   Glucose 116 (H) 65 - 99 mg/dL   BUN 11 8 - 27 mg/dL   Creatinine, Ser 0.84 0.57 - 1.00 mg/dL   GFR calc non Af Amer 71 >59 mL/min/1.73   GFR calc Af Amer 81 >59 mL/min/1.73   BUN/Creatinine Ratio 13 12 - 28   Sodium 141 134 - 144 mmol/L   Potassium 4.8 3.5 - 5.2 mmol/L   Chloride 103 96 - 106 mmol/L   CO2 24 20 - 29 mmol/L   Calcium 9.1 8.7 - 10.3 mg/dL   Total Protein 6.5 6.0 - 8.5 g/dL   Albumin 3.8 3.5 - 4.8 g/dL   Globulin, Total 2.7 1.5 - 4.5 g/dL   Albumin/Globulin Ratio 1.4 1.2 - 2.2   Bilirubin Total 0.3 0.0 - 1.2 mg/dL   Alkaline Phosphatase 104 39 - 117 IU/L   AST 24 0 - 40 IU/L   ALT 26 0 - 32 IU/L

## 2018-05-11 DIAGNOSIS — Z87891 Personal history of nicotine dependence: Secondary | ICD-10-CM | POA: Insufficient documentation

## 2018-06-07 ENCOUNTER — Other Ambulatory Visit: Payer: Self-pay | Admitting: Family Medicine

## 2018-06-07 DIAGNOSIS — Z1231 Encounter for screening mammogram for malignant neoplasm of breast: Secondary | ICD-10-CM

## 2018-06-16 ENCOUNTER — Encounter: Payer: Self-pay | Admitting: Family Medicine

## 2018-06-16 ENCOUNTER — Ambulatory Visit (INDEPENDENT_AMBULATORY_CARE_PROVIDER_SITE_OTHER): Payer: PPO | Admitting: Family Medicine

## 2018-06-16 VITALS — BP 120/71 | HR 57 | Ht 65.0 in | Wt 156.0 lb

## 2018-06-16 DIAGNOSIS — R300 Dysuria: Secondary | ICD-10-CM

## 2018-06-16 DIAGNOSIS — R350 Frequency of micturition: Secondary | ICD-10-CM

## 2018-06-16 LAB — POCT URINALYSIS DIPSTICK
Bilirubin, UA: NEGATIVE
Glucose, UA: NEGATIVE
Ketones, UA: NEGATIVE
Nitrite, UA: NEGATIVE
Protein, UA: NEGATIVE
Spec Grav, UA: <=1.005 — AB (ref 1.010–1.025)
Urobilinogen, UA: 0.2 E.U./dL
pH, UA: 6.5 (ref 5.0–8.0)

## 2018-06-16 MED ORDER — PHENAZOPYRIDINE HCL 200 MG PO TABS: 200.0000 mg | ORAL_TABLET | Freq: Three times a day (TID) | ORAL | 0 refills | Status: AC | PRN

## 2018-06-16 NOTE — Progress Notes (Signed)
Pt here for an acute care OV today   Impression and Recommendations:    1. Urine frequency   2. Dysuria     1. Urine Frequency  -Urinalysis today completed in the office and will be sent off for culture.   -Patient given prescription strength AZO today.   -If antibiotics are needed due to urine culture, then the patient will be notified at that time.   -Patient advised to call the office if symptoms worsened.   -Maintain follow up appointment on next Thursday, 06/23/2018.   Meds ordered this encounter  Medications  . phenazopyridine (PYRIDIUM) 200 MG tablet    Sig: Take 1 tablet (200 mg total) by mouth 3 (three) times daily as needed for up to 2 days for pain.    Dispense:  15 tablet    Refill:  0    Orders Placed This Encounter  Procedures  . Urine Culture  . POCT urinalysis dipstick     Education and routine counseling performed. Handouts provided  Gross side effects, risk and benefits, and alternatives of medications and treatment plan in general discussed with patient.  Patient is aware that all medications have potential side effects and we are unable to predict every side effect or drug-drug interaction that may occur.   Patient will call with any questions prior to using medication if they have concerns.  Expresses verbal understanding and consents to current therapy and treatment regimen.  No barriers to understanding were identified.  Red flag symptoms and signs discussed in detail.  Patient expressed understanding regarding what to do in case of emergency\urgent symptoms   Please see AVS handed out to patient at the end of our visit for further patient instructions/ counseling done pertaining to today's office visit.   Return if symptoms worsen or fail to improve.     Note: This document was prepared occasionally using Dragon voice recognition software and may include unintentional dictation errors in addition to a scribe.  This document serves as a  record of services personally performed by Mellody Dance, DO. It was created on her behalf by Steva Colder, a trained medical scribe. The creation of this record is based on the scribe's personal observations and the provider's statements to them.   I have reviewed the above medical documentation for accuracy and completeness and I concur.  Mellody Dance 06/26/18 9:32 PM    --------------------------------------------------------------------------------------------------------------------------------------------------------------------------------------------------------------------------------------------    Subjective:    CC:  Chief Complaint  Patient presents with  . Urinary Frequency    HPI: Daisy Nelson is a 72 y.o. female who presents to [facility] today for issues as discussed below.  She reports that she has been having urinary urgency, dysuria, urinary frequency, hematuria, and abdominal pressure prior to urinating. She notes that her symptoms began yesterday and she has been wearing depends since.   She denies recent sexual encounters. She denies fever, chills, nausea, vomiting, and any other symptoms.   She last had a UTI in March, however, she is not quite sure of the year.      No problems updated.   Wt Readings from Last 3 Encounters:  06/23/18 157 lb (71.2 kg)  06/16/18 156 lb (70.8 kg)  05/10/18 155 lb (70.3 kg)   BP Readings from Last 3 Encounters:  06/23/18 121/68  06/16/18 120/71  05/10/18 128/76   BMI Readings from Last 3 Encounters:  06/23/18 26.13 kg/m  06/16/18 25.96 kg/m  05/10/18 25.79 kg/m  Patient Care Team    Relationship Specialty Notifications Start End  Mellody Dance, DO PCP - General Family Medicine  05/10/18   Pixie Casino, MD Consulting Physician Cardiology  11/14/13   Vevelyn Royals, MD Consulting Physician Ophthalmology  07/18/16   [name], MD Consulting Physician  Ophthalmology  07/18/16   Carol Ada, MD Consulting Physician Gastroenterology  09/22/16      Patient Active Problem List   Diagnosis Date Noted  . Prediabetes 03/26/2015    Priority: High  . Atrial fibrillation with rapid ventricular response ([place]) 02/06/2013    Priority: High  . Hyperlipidemia 10/25/2012    Priority: High  . hx tob abuse;  30 yrs at roughly 1/2 ppd 05/11/2018    Priority: Medium  . Drug-induced bradycardia-  rate controlled on metoprolol by cards, patient ASx 05/10/2018    Priority: Medium  . Hormone replacement therapy 03/23/2017    Priority: Medium  . GERD (gastroesophageal reflux disease) 10/25/2012    Priority: Medium  . Anxiety 10/25/2012    Priority: Medium  . Family history of coronary artery bypass surgery 02/06/2013    Priority: Low  . DJD (degenerative joint disease) of cervical spine 10/25/2012    Priority: Low  . Colon polyps 10/25/2012    Priority: Low  . Depression 05/10/2018  . Preretinal fibrosis, right eye 08/25/2016  . PAF (paroxysmal atrial fibrillation) ([place]) 07/10/2013  . Postmenopausal HRT (hormone replacement therapy) 11/15/2012  . Allergic rhinitis 10/25/2012  . Nephrolithiasis 10/25/2012  . Complication of anesthesia 11/30/2009    Past Medical history, Surgical history, Family history, Social history, Allergies and Medications have been entered into the medical record, reviewed and changed as needed.    Current Meds  Medication Sig  . atorvastatin (LIPITOR) 40 MG tablet TAKE 1 TABLET BY MOUTH ONCE DAILY  . estradiol (ESTRACE) 1 MG tablet Take 1 tablet (1 mg total) by mouth daily.  Marland Kitchen Fexofenadine HCl (ALLEGRA ALLERGY PO) Take 1 tablet by mouth as needed.  . flecainide (TAMBOCOR) 100 MG tablet Take 2 tablets by mouth with onset of atrial fib. No more than once a week  . fluticasone (FLONASE) 50 MCG/ACT nasal spray Place 2 sprays into both nostrils daily.  Marland Kitchen loratadine (CLARITIN) 10 MG tablet Take 10 mg by mouth as needed for  allergies.  . metoprolol tartrate (LOPRESSOR) 25 MG tablet Take 0.5 tablets (12.5 mg total) by mouth 2 (two) times daily.  . sertraline (ZOLOFT) 100 MG tablet Take 1 tablet by mouth daily.   Alveda Reasons 20 MG TABS tablet TAKE 1 TABLET BY MOUTH ONCE DAILY WITH SUPPER  . [DISCONTINUED] ranitidine (ZANTAC) 150 MG tablet Take 150 mg by mouth every evening.     Allergies:  Allergies  Allergen Reactions  . Adhesive [Tape]     Burn skin  . Erythromycin     Gi intolerance - nausea, vomiting, and abd pains  . Naproxen Nausea And Vomiting    Abdominal pain   . Penicillins Hives    Has patient had a PCN reaction causing immediate rash, facial/tongue/throat swelling, SOB or lightheadedness with hypotension: UNKNOWN Has patient had a PCN reaction causing severe rash involving mucus membranes or skin necrosis: No Has patient had a PCN reaction that required hospitalization No Has patient had a PCN reaction occurring within the last 10 years: No If all of the above answers are "NO", then may proceed with Cephalosporin use.      Review of Systems: General:   Denies fever, chills,  unexplained weight loss.  Optho/Auditory:   Denies visual changes, blurred vision/LOV Respiratory:   Denies wheeze, DOE more than baseline levels.  Cardiovascular:   Denies chest pain, palpitations, new onset peripheral edema  Gastrointestinal:   Denies nausea, vomiting, diarrhea, abd pain.  Genitourinary: Denies dysuria, freq/ urgency, flank pain or discharge from genitals.  Endocrine:     Denies hot or cold intolerance, polyuria, polydipsia. Musculoskeletal:   Denies unexplained myalgias, joint swelling, unexplained arthralgias, gait problems.  Skin:  Denies new onset rash, suspicious lesions Neurological:     Denies dizziness, unexplained weakness, numbness  Psychiatric/Behavioral:   Denies mood changes, suicidal or homicidal ideations, hallucinations    Objective:   Blood pressure 120/71, pulse (!) 57, height 5'  5" (1.651 m), weight 156 lb (70.8 kg), SpO2 94 %. Body mass index is 25.96 kg/m. General:  Well Developed, well nourished, appropriate for stated age.  Neuro:  Alert and oriented,  extra-ocular muscles intact  HEENT:  Normocephalic, atraumatic, neck supple Skin:  no gross rash, warm, pink. Cardiac:  RRR, S1 S2 Respiratory:  ECTA B/L and A/P, Not using accessory muscles, speaking in full sentences- unlabored. Vascular:  Ext warm, no cyanosis apprec.; cap RF less 2 sec. Psych:  No HI/SI, judgement and insight good, Euthymic mood. Full Affect.

## 2018-06-16 NOTE — Patient Instructions (Signed)
Please keep your follow-up appointment.  We will let you know about the urine culture.  Please go pick up your prescription to help with your symptoms that you are having.  If anything changes please let us know.   Urinary Frequency, Adult Urinary frequency means urinating more often than usual. People with urinary frequency urinate at least 8 times in 24 hours, even if they drink a normal amount of fluid. Although they urinate more often than normal, the total amount of urine produced in a day may be normal. Urinary frequency is also called pollakiuria. What are the causes? This condition may be caused by:  A urinary tract infection.  Obesity.  Bladder problems, such as bladder stones.  Caffeine or alcohol.  Eating food or drinking fluids that irritate the bladder. These include coffee, tea, soda, artificial sweeteners, citrus, tomato-based foods, and chocolate.  Certain medicines, such as medicines that help the body get rid of extra fluid (diuretics).  Muscle or nerve weakness.  Overactive bladder.  Chronic diabetes.  Interstitial cystitis.  In men, problems with the prostate, such as an enlarged prostate.  In women, pregnancy.  In some cases, the cause may not be known. What increases the risk? This condition is more likely to develop in:  Women who have gone through menopause.  Men with prostate problems.  People with a disease or injury that affects the nerves or spinal cord.  People who have or have had a condition that affects the brain, such as a stroke.  What are the signs or symptoms? Symptoms of this condition include:  Feeling an urgent need to urinate often. The stress and anxiety of needing to find a bathroom quickly can make this urge worse.  Urinating 8 or more times in 24 hours.  Urinating as often as every 1 to 2 hours.  How is this diagnosed? This condition is diagnosed based on your symptoms, your medical history, and a physical exam. You  may have tests, such as:  Blood tests.  Urine tests.  Imaging tests, such as X-rays or ultrasounds.  A bladder test.  A test of your neurological system. This is the body system that senses the need to urinate.  A test to check for problems in the urethra and bladder called cystoscopy.  You may also be asked to keep a bladder diary. A bladder diary is a record of what you eat and drink, how often you urinate, and how much you urinate. You may need to see a health care provider who specializes in conditions of the urinary tract (urologist) or kidneys (nephrologist). How is this treated? Treatment for this condition depends on the cause. Sometimes the condition goes away on its own and treatment is not necessary. If treatment is needed, it may include:  Taking medicine.  Learning exercises that strengthen the muscles that help control urination.  Following a bladder training program. This may include: ? Learning to delay going to the bathroom. ? Double urinating (voiding). This helps if you are not completely emptying your bladder. ? Scheduled voiding.  Making diet changes, such as: ? Avoiding caffeine. ? Drinking fewer fluids, especially alcohol. ? Not drinking in the evening. ? Not having foods or drinks that may irritate the bladder. ? Eating foods that help prevent or ease constipation. Constipation can make this condition worse.  Having the nerves in your bladder stimulated. There are two options for stimulating the nerves to your bladder: ? Outpatient electrical nerve stimulation. This is done by your  health care provider. ? Surgery to implant a bladder pacemaker. The pacemaker helps to control the urge to urinate.  Follow these instructions at home:  Keep a bladder diary if told to by your health care provider.  Take over-the-counter and prescription medicines only as told by your health care provider.  Do any exercises as told by your health care provider.  Follow  a bladder training program as told by your health care provider.  Make any recommended diet changes.  Keep all follow-up visits as told by your health care provider. This is important. Contact a health care provider if:  You start urinating more often.  You feel pain or irritation when you urinate.  You notice blood in your urine.  Your urine looks cloudy.  You develop a fever.  You begin vomiting. Get help right away if:  You are unable to urinate. This information is not intended to replace advice given to you by your health care provider. Make sure you discuss any questions you have with your health care provider. Document Released: 09/12/2009 Document Revised: 12/18/2015 Document Reviewed: 06/12/2015 Elsevier Interactive Patient Education  [employer].

## 2018-06-20 ENCOUNTER — Other Ambulatory Visit: Payer: PPO

## 2018-06-20 LAB — URINE CULTURE

## 2018-06-23 ENCOUNTER — Encounter: Payer: Self-pay | Admitting: Family Medicine

## 2018-06-23 ENCOUNTER — Ambulatory Visit (INDEPENDENT_AMBULATORY_CARE_PROVIDER_SITE_OTHER): Payer: PPO | Admitting: Family Medicine

## 2018-06-23 VITALS — BP 121/68 | HR 57 | Ht 65.0 in | Wt 157.0 lb

## 2018-06-23 DIAGNOSIS — Z79890 Hormone replacement therapy (postmenopausal): Secondary | ICD-10-CM

## 2018-06-23 DIAGNOSIS — F39 Unspecified mood [affective] disorder: Secondary | ICD-10-CM | POA: Diagnosis not present

## 2018-06-23 DIAGNOSIS — I480 Paroxysmal atrial fibrillation: Secondary | ICD-10-CM | POA: Diagnosis not present

## 2018-06-23 DIAGNOSIS — I4891 Unspecified atrial fibrillation: Secondary | ICD-10-CM | POA: Diagnosis not present

## 2018-06-23 DIAGNOSIS — K219 Gastro-esophageal reflux disease without esophagitis: Secondary | ICD-10-CM

## 2018-06-23 DIAGNOSIS — R7303 Prediabetes: Secondary | ICD-10-CM

## 2018-06-23 LAB — POCT GLYCOSYLATED HEMOGLOBIN (HGB A1C): HbA1c, POC (prediabetic range): 6.1 % (ref 5.7–6.4)

## 2018-06-23 MED ORDER — RANITIDINE HCL 150 MG PO TABS: 150.0000 mg | ORAL_TABLET | Freq: Two times a day (BID) | ORAL | Status: DC

## 2018-06-23 NOTE — Progress Notes (Signed)
Impression and Recommendations:    1. Prediabetes   2. Mood disorder ([place])   3. Hormone replacement therapy   4. Atrial fibrillation with rapid ventricular response ([place])   5. PAF (paroxysmal atrial fibrillation) ([place])   6. Gastroesophageal reflux disease, esophagitis presence not specified      1. Pre-Diabetes -A1c today (06/23/2018) at 6.1 which is improved from 6.4 on 03/29/2018.  -Discussed with the patient regarding the importance of low carb/ketogenic diet discussed with patient in addition to regular exercise.    2. Mood -Stable. She is taking 100 mg of Zoloft daily.   3. GERD -Recommended that the patient take 150 mg Zantac BID to aid in GERD symptoms, patient agreed and will start on this regimen.   -Advised the patient that she shouldn't eat or drink anything 3 hours prior to going to sleep.   4. HTN -Stable and asymptomatic. Continue on medications as prescribed.   5. A-Fib -Stable and asymptomatic. Medications maintained by cardiologist. Advised to maintain follow up with her Cardiologist.   6. [place] the patient to continue to taper off of Rx 1 mg Estradiol as previously informed by Provider.   7. Exercise and Health Maintenance -Advised that the patient continue with her exercise regimen.     Education and routine counseling performed. Handouts provided.  Orders Placed This Encounter  Procedures  . POCT glycosylated hemoglobin (Hb A1C)    Medications Discontinued During This Encounter  Medication Reason  . ranitidine (ZANTAC) 150 MG tablet Reorder      Meds ordered this encounter  Medications  . ranitidine (ZANTAC) 150 MG tablet    Sig: Take 1 tablet (150 mg total) by mouth 2 (two) times daily.    Return for 45mo-weaning [employer] GERD med, mood, prediabetes.   The patient was counseled, risk factors were discussed, anticipatory guidance given.  Gross side effects, risk and benefits, and alternatives of medications discussed  with patient.  Patient is aware that all medications have potential side effects and we are unable to predict every side effect or drug-drug interaction that may occur.  Expresses verbal understanding and consents to current therapy plan and treatment regimen.  Please see AVS handed out to patient at the end of our visit for further patient instructions/ counseling done pertaining to today's office visit.    Note:  This document was prepared using Dragon voice recognition software and may include unintentional dictation errors.  This document serves as a record of services personally performed by Mellody Dance, DO. It was created on her behalf by Steva Colder, a trained medical scribe. The creation of this record is based on the scribe's personal observations and the provider's statements to them.   I have reviewed the above medical documentation for accuracy and completeness and I concur.  Mellody Dance 07/06/18 5:10 PM     Subjective:    Chief Complaint  Patient presents with  . Follow-up     MARCEE JACOBS is a 72 y.o. female who presents to [facility] today for Diabetes Management. She is doing well overall. She notes that she has actively cut down on her sugar intake.   Mood:  She is doing well overall and she is mellow on her current dose of 100 mg Zoloft daily.   Exercise:  She is active and isn't sedentary for majority of the day. She doesn't have a regimen of exercise, however, she notes that she walks around her  house for exercise.   Estrogen:  She is taking 1 mg Estradiol and she is now doing a gradual taper of this medication. She hasn't noticed any night sweats, hot flashes, or mood changes.   GERD:  She notes that she has increased belching and she takes Zantac which she is unsure if it is working.    DM HPI: -  She has been working on diet and exercise for pre-diabetes  Pt is currently maintained through diet and exercise.     Home glucose readings range: she is not checking her blood sugar levels at home    Denies polyuria/polydipsia. Denies hypo/ hyperglycemia symptoms - She denies new onset of: chest pain, exercise intolerance, shortness of breath, dizziness, visual changes, headache, lower extremity swelling or claudication.   Last diabetic eye exam was No results found for: [id number]  Foot exam- UTD  Last A1C in the office was:  Lab Results  Component Value Date   HGBA1C 6.1 06/23/2018   HGBA1C 6.4 (H) 03/29/2018   HGBA1C 6.1 (H) 09/28/2017    Lab Results  Component Value Date   LDLCALC 55 03/29/2018   CREATININE 0.84 03/29/2018      Last 3 blood pressure readings in our office are as follows: BP Readings from Last 3 Encounters:  06/23/18 121/68  06/16/18 120/71  05/10/18 128/76    BMI Readings from Last 3 Encounters:  06/23/18 26.13 kg/m  06/16/18 25.96 kg/m  05/10/18 25.79 kg/m     No problems updated.    Patient Care Team    Relationship Specialty Notifications Start End  Mellody Dance, DO PCP - General Family Medicine  05/10/18   Pixie Casino, MD Consulting Physician Cardiology  11/14/13   Vevelyn Royals, MD Consulting Physician Ophthalmology  07/18/16   [name], MD Consulting Physician Ophthalmology  07/18/16   Carol Ada, MD Consulting Physician Gastroenterology  09/22/16      Patient Active Problem List   Diagnosis Date Noted  . Prediabetes 03/26/2015    Priority: High  . Atrial fibrillation with rapid ventricular response ([place]) 02/06/2013    Priority: High  . Hyperlipidemia 10/25/2012    Priority: High  . hx tob abuse;  30 yrs at roughly 1/2 ppd 05/11/2018    Priority: Medium  . Drug-induced bradycardia-  rate controlled on metoprolol by cards, patient ASx 05/10/2018    Priority: Medium  . Hormone replacement therapy 03/23/2017    Priority: Medium  . GERD (gastroesophageal reflux disease) 10/25/2012    Priority: Medium  .  Anxiety 10/25/2012    Priority: Medium  . Family history of coronary artery bypass surgery 02/06/2013    Priority: Low  . DJD (degenerative joint disease) of cervical spine 10/25/2012    Priority: Low  . Colon polyps 10/25/2012    Priority: Low  . Depression 05/10/2018  . Preretinal fibrosis, right eye 08/25/2016  . PAF (paroxysmal atrial fibrillation) ([place]) 07/10/2013  . Postmenopausal HRT (hormone replacement therapy) 11/15/2012  . Allergic rhinitis 10/25/2012  . Nephrolithiasis 10/25/2012  . Complication of anesthesia 11/30/2009     Past Medical History:  Diagnosis Date  . AF (atrial fibrillation) ([place])    AF with RVR 01/2013  . Allergy   . Anxiety   . Atrial fibrillation with RVR ([place]) 08/31/2015  . Colon polyps   . Complication of anesthesia 2011   colonscopy- "slow to awaken"  . Depression   . DJD (degenerative joint disease) of cervical spine   . Family history of CABG   .  GERD (gastroesophageal reflux disease)   . History of kidney stones   . Hyperglycemia   . Hyperlipidemia   . Jaw pain 08/31/2015  . Pneumonia   . PONV (postoperative nausea and vomiting)      Past Surgical History:  Procedure Laterality Date  . [place] VITRECTOMY WITH 20 GAUGE MVR PORT FOR MACULAR HOLE Right 08/25/2016   Procedure: 25 GAUGE PARS PLANA VITRECTOMY; RIGHT EYE;  Surgeon: [name], MD;  Location: [place];  Service: Ophthalmology;  Laterality: Right;  . ABDOMINAL HYSTERECTOMY  1991  . CATARACT EXTRACTION W/ INTRAOCULAR LENS  IMPLANT, BILATERAL Bilateral 03/2013  . CESAREAN SECTION  1976  . COLONOSCOPY  2011   polyps  . EYE SURGERY    . GANGLION CYST EXCISION Left    hand  . GAS/FLUID EXCHANGE Right 08/25/2016   Procedure: GAS/FLUID EXCHANGE RIGHT EYE;  Surgeon: [name], MD;  Location: [place];  Service: Ophthalmology;  Laterality: Right;  . IRIDECTOMY Right 08/25/2016   Procedure: PERIPHERAL IRIDECTOMY RIGHT EYE;  Surgeon: [name], MD;  Location: [place];  Service: Ophthalmology;  Laterality: Right;  . LAPAROSCOPY  1976  . MEMBRANE PEEL Right 08/25/2016   Procedure: MEMBRANE PEEL RIGHT EYE;  Surgeon: [name], MD;  Location: [place];  Service: Ophthalmology;  Laterality: Right;  . NM MYOCAR PERF WALL MOTION  01/2013   lexiscan - no pharmacologically induced ischemia, fixed defects in apical segments of anterior septum & inferior lateral wall   . PARS PLANA VITRECTOMY Right 08/25/2016   laser, gas injection, AC wash out. Membrane peel right eye  . TRANSTHORACIC ECHOCARDIOGRAM  01/2013   EF 55-60%, mod conc LVH; mild MR with mildly thickened leaflets; mild TR; PA peak pressure 16mmHg  . TUBAL LIGATION  1977     Family History  Problem Relation Age of Onset  . Stroke Mother   . Diabetes Father   . Heart disease Father        CABG  . Arthritis Father        C6-7  . Skin cancer Father        SCC  . Arthritis Brother        s/p THR     Social History   Substance and Sexual Activity  Drug Use No  ,  Social History   Substance and Sexual Activity  Alcohol Use No  ,  Social History   Tobacco Use  Smoking Status Former Smoker  . Packs/day: 0.50  . Years: 36.00  . Pack years: 18.00  . Types: Cigarettes  . Start date: 03/15/1963  . Last attempt to quit: 01/08/2005  . Years since quitting: 13.4  Smokeless Tobacco Never Used  ,    Current Outpatient Medications on File Prior to Visit  Medication Sig Dispense Refill  . atorvastatin (LIPITOR) 40 MG tablet TAKE 1 TABLET BY MOUTH ONCE DAILY 90 tablet 3  . estradiol (ESTRACE) 1 MG tablet Take 1 tablet (1 mg total) by mouth daily. 90 tablet 3  . Fexofenadine HCl (ALLEGRA ALLERGY PO) Take 1 tablet by mouth as needed.    . flecainide (TAMBOCOR) 100 MG tablet Take 2 tablets by mouth with onset of atrial fib. No more than once a week 6 tablet 5  . fluticasone (FLONASE) 50 MCG/ACT nasal spray Place 2 sprays into both nostrils daily. 48 g 3  . loratadine (CLARITIN) 10 MG tablet  Take 10 mg by mouth as needed for allergies.    Marland Kitchen  metoprolol tartrate (LOPRESSOR) 25 MG tablet Take 0.5 tablets (12.5 mg total) by mouth 2 (two) times daily. 90 tablet 2  . sertraline (ZOLOFT) 100 MG tablet Take 1 tablet by mouth daily.   3  . XARELTO 20 MG TABS tablet TAKE 1 TABLET BY MOUTH ONCE DAILY WITH SUPPER 90 tablet 1   No current facility-administered medications on file prior to visit.      Allergies  Allergen Reactions  . Adhesive [Tape]     Burn skin  . Erythromycin     Gi intolerance - nausea, vomiting, and abd pains  . Naproxen Nausea And Vomiting    Abdominal pain   . Penicillins Hives    Has patient had a PCN reaction causing immediate rash, facial/tongue/throat swelling, SOB or lightheadedness with hypotension: UNKNOWN Has patient had a PCN reaction causing severe rash involving mucus membranes or skin necrosis: No Has patient had a PCN reaction that required hospitalization No Has patient had a PCN reaction occurring within the last 10 years: No If all of the above answers are "NO", then may proceed with Cephalosporin use.      Review of Systems:   General:  Denies fever, chills Optho/Auditory:   Denies visual changes, blurred vision Respiratory:   Denies SOB, cough, wheeze, DIB  Cardiovascular:   Denies chest pain, palpitations, painful respirations Gastrointestinal:   Denies nausea, vomiting, diarrhea.  Endocrine:     Denies new hot or cold intolerance Musculoskeletal:  Denies joint swelling, gait issues, or new unexplained myalgias/ arthralgias Skin:  Denies rash, suspicious lesions  Neurological:    Denies dizziness, unexplained weakness, numbness  Psychiatric/Behavioral:   Denies mood changes    Objective:     Blood pressure 121/68, pulse (!) 57, height 5\' 5"  (1.651 m), weight 157 lb (71.2 kg), SpO2 98 %.  Body mass index is 26.13 kg/m.  General: Well Developed, well nourished, and in no acute distress.  HEENT: Normocephalic, atraumatic, pupils  equal round reactive to light, neck supple, No carotid bruits, no JVD Skin: Warm and dry, cap RF less 2 sec Cardiac: Regular rate and rhythm, S1, S2 WNL's, no murmurs rubs or gallops Respiratory: ECTA B/L, Not using accessory muscles, speaking in full sentences. NeuroM-Sk: Ambulates w/o assistance, moves ext * 4 w/o difficulty, sensation grossly intact.  Ext: scant edema b/l lower ext Psych: No HI/SI, judgement and insight good, Euthymic mood. Full Affect.

## 2018-06-23 NOTE — Patient Instructions (Addendum)
Please realize, EXERCISE IS MEDICINE!  -  American Heart Association [facility]) guidelines for exercise : If you are in good health, without any medical conditions, you should engage in 150 minutes of moderate intensity aerobic activity per week.  This means you should be huffing and puffing throughout your workout.   Engaging in regular exercise will improve brain function and memory, as well as improve mood, boost immune system and help with weight management.  As well as the other, more well-known effects of exercise such as decreasing blood sugar levels, decreasing blood pressure,  and decreasing bad cholesterol levels/ increasing good cholesterol levels.     -  The AHA strongly endorses consumption of a diet that contains a variety of foods from all the food categories with an emphasis on fruits and vegetables; fat-free and low-fat dairy products; cereal and grain products; legumes and nuts; and fish, poultry, and/or extra lean meats.    Excessive food intake, especially of foods high in saturated and trans fats, sugar, and salt, should be avoided.    Adequate water intake of roughly 1/2 of your weight in pounds, should equal the ounces of water per day you should drink.  So for instance, if you're 200 pounds, that would be 100 ounces of water per day.         Mediterranean Diet  Why follow it? Research shows. . Those who follow the Mediterranean diet have a reduced risk of heart disease  . The diet is associated with a reduced incidence of Parkinson's and Alzheimer's diseases . People following the diet may have longer life expectancies and lower rates of chronic diseases  . The Dietary Guidelines for Americans recommends the Mediterranean diet as an eating plan to promote health and prevent disease  What Is the Mediterranean Diet?  . Healthy eating plan based on typical foods and recipes of Mediterranean-style cooking . The diet is primarily a plant based diet; these foods should make up a  majority of meals   Starches - Plant based foods should make up a majority of meals - They are an important sources of vitamins, minerals, energy, antioxidants, and fiber - Choose whole grains, foods high in fiber and minimally processed items  - Typical grain sources include wheat, oats, barley, corn, brown rice, bulgar, farro, millet, polenta, couscous  - Various types of beans include chickpeas, lentils, fava beans, black beans, white beans   Fruits  Veggies - Large quantities of antioxidant rich fruits & veggies; 6 or more servings  - Vegetables can be eaten raw or lightly drizzled with oil and cooked  - Vegetables common to the traditional Mediterranean Diet include: artichokes, arugula, beets, broccoli, brussel sprouts, cabbage, carrots, celery, collard greens, cucumbers, eggplant, kale, leeks, lemons, lettuce, mushrooms, okra, onions, peas, peppers, potatoes, pumpkin, radishes, rutabaga, shallots, spinach, sweet potatoes, turnips, zucchini - Fruits common to the Mediterranean Diet include: apples, apricots, avocados, cherries, clementines, dates, figs, grapefruits, grapes, melons, nectarines, oranges, peaches, pears, pomegranates, strawberries, tangerines  Fats - Replace butter and margarine with healthy oils, such as olive oil, canola oil, and tahini  - Limit nuts to no more than a handful a day  - Nuts include walnuts, almonds, pecans, pistachios, pine nuts  - Limit or avoid candied, honey roasted or heavily salted nuts - Olives are central to the Mediterranean diet - can be eaten whole or used in a variety of dishes   Meats Protein - Limiting red meat: no more than a few times a month -  When eating red meat: choose lean cuts and keep the portion to the size of deck of cards - Eggs: approx. 0 to 4 times a week  - Fish and lean poultry: at least 2 a week  - Healthy protein sources include, chicken, [place], lean beef, lamb - Increase intake of seafood such as tuna, salmon, trout,  mackerel, shrimp, scallops - Avoid or limit high fat processed meats such as sausage and bacon  Dairy - Include moderate amounts of low fat dairy products  - Focus on healthy dairy such as fat free yogurt, skim milk, low or reduced fat cheese - Limit dairy products higher in fat such as whole or 2% milk, cheese, ice cream  Alcohol - Moderate amounts of red wine is ok  - No more than 5 oz daily for women (all ages) and men older than age 72  - No more than 10 oz of wine daily for men younger than 12  Other - Limit sweets and other desserts  - Use herbs and spices instead of salt to flavor foods  - Herbs and spices common to the traditional Mediterranean Diet include: basil, bay leaves, chives, cloves, cumin, fennel, garlic, lavender, marjoram, mint, oregano, parsley, pepper, rosemary, sage, savory, sumac, tarragon, thyme   It's not just a diet, it's a lifestyle:  . The Mediterranean diet includes lifestyle factors typical of those in the region  . Foods, drinks and meals are best eaten with others and savored . Daily physical activity is important for overall good health . This could be strenuous exercise like running and aerobics . This could also be more leisurely activities such as walking, housework, yard-work, or taking the stairs . Moderation is the key; a balanced and healthy diet accommodates most foods and drinks . Consider portion sizes and frequency of consumption of certain foods   Meal Ideas & Options:  . Breakfast:  o Whole wheat toast or whole wheat English muffins with peanut butter & hard boiled egg o Steel cut oats topped with apples & cinnamon and skim milk  o Fresh fruit: banana, strawberries, melon, berries, peaches  o Smoothies: strawberries, bananas, greek yogurt, peanut butter o Low fat greek yogurt with blueberries and granola  o Egg white omelet with spinach and mushrooms o Breakfast couscous: whole wheat couscous, apricots, skim milk, cranberries  . Sandwiches:   o Hummus and grilled vegetables (peppers, zucchini, squash) on whole wheat bread   o Grilled chicken on whole wheat pita with lettuce, tomatoes, cucumbers or tzatziki  o Tuna salad on whole wheat bread: tuna salad made with greek yogurt, olives, red peppers, capers, green onions o Garlic rosemary lamb pita: lamb sauted with garlic, rosemary, salt & pepper; add lettuce, cucumber, greek yogurt to pita - flavor with lemon juice and black pepper  . Seafood:  o Mediterranean grilled salmon, seasoned with garlic, basil, parsley, lemon juice and black pepper o Shrimp, lemon, and spinach whole-grain pasta salad made with low fat greek yogurt  o Seared scallops with lemon orzo  o Seared tuna steaks seasoned salt, pepper, coriander topped with tomato mixture of olives, tomatoes, olive oil, minced garlic, parsley, green onions and cappers  . Meats:  o Herbed greek chicken salad with kalamata olives, cucumber, feta  o Red bell peppers stuffed with spinach, bulgur, lean ground beef (or lentils) & topped with feta   o Kebabs: skewers of chicken, tomatoes, onions, zucchini, squash  o [place] burgers: made with red onions, mint, dill, lemon juice, feta  cheese topped with roasted red peppers . Vegetarian o Cucumber salad: cucumbers, artichoke hearts, celery, red onion, feta cheese, tossed in olive oil & lemon juice  o Hummus and whole grain pita points with a greek salad (lettuce, tomato, feta, olives, cucumbers, red onion) o Lentil soup with celery, carrots made with vegetable broth, garlic, salt and pepper  o Tabouli salad: parsley, bulgur, mint, scallions, cucumbers, tomato, radishes, lemon juice, olive oil, salt and pepper.   Risk factors for prediabetes and type 2 diabetes  Researchers don't fully understand why some people develop prediabetes and type 2 diabetes and others don't.  It's clear that certain factors increase the risk, however, including:  Weight. The more fatty tissue you have, the more  resistant your cells become to insulin.  Inactivity. The less active you are, the greater your risk. Physical activity helps you control your weight, uses up glucose as energy and makes your cells more sensitive to insulin.  Family history. Your risk increases if a parent or sibling has type 2 diabetes.  Race. Although it's unclear why, people of certain races - including blacks, Hispanics, American Indians and Asian-Americans - are at higher risk.  Age. Your risk increases as you get older. This may be because you tend to exercise less, lose muscle mass and gain weight as you age. But type 2 diabetes is also increasing dramatically among children, adolescents and younger adults.  Gestational diabetes. If you developed gestational diabetes when you were pregnant, your risk of developing prediabetes and type 2 diabetes later increases. If you gave birth to a baby weighing more than 9 pounds (4 kilograms), you're also at risk of type 2 diabetes.  Polycystic ovary syndrome. For women, having polycystic ovary syndrome - a common condition characterized by irregular menstrual periods, excess hair growth and obesity - increases the risk of diabetes.  High blood pressure. Having blood pressure over 140/90 millimeters of mercury (mm Hg) is linked to an increased risk of type 2 diabetes.  Abnormal cholesterol and triglyceride levels. If you have low levels of high-density lipoprotein (HDL), or "good," cholesterol, your risk of type 2 diabetes is higher. Triglycerides are another type of fat carried in the blood. People with high levels of triglycerides have an increased risk of type 2 diabetes. Your doctor can let you know what your cholesterol and triglyceride levels are.  A good guide to good carbs: The glycemic index ---If you have diabetes, or at risk for diabetes, you know all too well that when you eat carbohydrates, your blood sugar goes up. The total amount of carbs you consume at a meal or in a snack  mostly determines what your blood sugar will do. But the food itself also plays a role. A serving of white rice has almost the same effect as eating pure table sugar - a quick, high spike in blood sugar. A serving of lentils has a slower, smaller effect.  ---Picking good sources of carbs can help you control your blood sugar and your weight. Even if you don't have diabetes, eating healthier carbohydrate-rich foods can help ward off a host of chronic conditions, from heart disease to various cancers to, well, diabetes.  ---One way to choose foods is with the glycemic index (GI). This tool measures how much a food boosts blood sugar.  The glycemic index rates the effect of a specific amount of a food on blood sugar compared with the same amount of pure glucose. A food with a glycemic index of 28  boosts blood sugar only 28% as much as pure glucose. One with a GI of 95 acts like pure glucose.    High glycemic foods result in a quick spike in insulin and blood sugar (also known as blood glucose).  Low glycemic foods have a slower, smaller effect- these are healthier for you.   Using the glycemic index Using the glycemic index is easy: choose foods in the low GI category instead of those in the high GI category (see below), and go easy on those in between. Low glycemic index (GI of 55 or less): Most fruits and vegetables, beans, minimally processed grains, pasta, low-fat dairy foods, and nuts.  Moderate glycemic index (GI 56 to 69): White and sweet potatoes, corn, white rice, couscous, breakfast cereals such as Cream of Wheat and Mini Wheats.  High glycemic index (GI of 70 or higher): White bread, rice cakes, most crackers, bagels, cakes, doughnuts, croissants, most packaged breakfast cereals. You can see the values for 100 commons foods and get links to more at www.health.CheapToothpicks.si.  Swaps for lowering glycemic index  Instead of this high-glycemic index food Eat this lower-glycemic index food   White rice Brown rice or converted rice  Instant oatmeal Steel-cut oats  Cornflakes Bran flakes  Baked potato Pasta, bulgur  White bread Whole-grain bread  Corn Peas or leafy greens       Prediabetes Eating Plan  Prediabetes--also called impaired glucose tolerance or impaired fasting glucose--is a condition that causes blood sugar (blood glucose) levels to be higher than normal. Following a healthy diet can help to keep prediabetes under control. It can also help to lower the risk of type 2 diabetes and heart disease, which are increased in people who have prediabetes. Along with regular exercise, a healthy diet:  Promotes weight loss.  Helps to control blood sugar levels.  Helps to improve the way that the body uses insulin.   WHAT DO I NEED TO KNOW ABOUT THIS EATING PLAN?   Use the glycemic index (GI) to plan your meals. The index tells you how quickly a food will raise your blood sugar. Choose low-GI foods. These foods take a longer time to raise blood sugar.  Pay close attention to the amount of carbohydrates in the food that you eat. Carbohydrates increase blood sugar levels.  Keep track of how many calories you take in. Eating the right amount of calories will help you to achieve a healthy weight. Losing about 7 percent of your starting weight can help to prevent type 2 diabetes.  You may want to follow a Mediterranean diet. This diet includes a lot of vegetables, lean meats or fish, whole grains, fruits, and healthy oils and fats.   WHAT FOODS CAN I EAT?  Grains Whole grains, such as whole-wheat or whole-grain breads, crackers, cereals, and pasta. Unsweetened oatmeal. Bulgur. Barley. Quinoa. Brown rice. Corn or whole-wheat flour tortillas or taco shells. Vegetables Lettuce. Spinach. Peas. Beets. Cauliflower. Cabbage. Broccoli. Carrots. Tomatoes. Squash. Eggplant. Herbs. Peppers. Onions. Cucumbers. Brussels sprouts. Fruits Berries. Bananas. Apples. Oranges. Grapes.  Papaya. Mango. Pomegranate. Kiwi. Grapefruit. Cherries. Meats and Other Protein Sources Seafood. Lean meats, such as chicken and [place] or lean cuts of pork and beef. Tofu. Eggs. Nuts. Beans. Dairy Low-fat or fat-free dairy products, such as yogurt, cottage cheese, and cheese. Beverages Water. Tea. Coffee. Sugar-free or diet soda. Seltzer water. Milk. Milk alternatives, such as soy or almond milk. Condiments Mustard. Relish. Low-fat, low-sugar ketchup. Low-fat, low-sugar barbecue sauce. Low-fat or fat-free mayonnaise. Sweets  and Desserts Sugar-free or low-fat pudding. Sugar-free or low-fat ice cream and other frozen treats. Fats and Oils Avocado. Walnuts. Olive oil. The items listed above may not be a complete list of recommended foods or beverages. Contact your dietitian for more options.    WHAT FOODS ARE NOT RECOMMENDED?  Grains Refined white flour and flour products, such as bread, pasta, snack foods, and cereals. Beverages Sweetened drinks, such as sweet iced tea and soda. Sweets and Desserts Baked goods, such as cake, cupcakes, pastries, cookies, and cheesecake. The items listed above may not be a complete list of foods and beverages to avoid. Contact your dietitian for more information.   This information is not intended to replace advice given to you by your health care provider. Make sure you discuss any questions you have with your health care provider.   Document Released: 04/02/2015 Document Reviewed: 04/02/2015 Elsevier Interactive Patient Education [employer].

## 2018-07-13 ENCOUNTER — Other Ambulatory Visit: Payer: Self-pay

## 2018-07-13 ENCOUNTER — Encounter: Payer: Self-pay | Admitting: Family Medicine

## 2018-07-13 ENCOUNTER — Ambulatory Visit
Admission: RE | Admit: 2018-07-13 | Discharge: 2018-07-13 | Disposition: A | Discharge status: Home / Self Care | Payer: PPO | Source: Ambulatory Visit | Attending: Family Medicine | Admitting: Family Medicine

## 2018-07-13 DIAGNOSIS — Z1231 Encounter for screening mammogram for malignant neoplasm of breast: Secondary | ICD-10-CM | POA: Diagnosis not present

## 2018-07-13 DIAGNOSIS — J309 Allergic rhinitis, unspecified: Secondary | ICD-10-CM

## 2018-07-13 MED ORDER — FLUTICASONE PROPIONATE 50 MCG/ACT NA SUSP: 2.0000 | Freq: Every day | NASAL | 3 refills | Status: DC

## 2018-07-13 NOTE — Telephone Encounter (Signed)
We have not prescribed these medications for the patient previously.  Please review and refill if appropriate.  T. Deep Bonawitz, CMA  

## 2018-07-14 ENCOUNTER — Other Ambulatory Visit: Payer: Self-pay

## 2018-07-14 DIAGNOSIS — J309 Allergic rhinitis, unspecified: Secondary | ICD-10-CM

## 2018-07-14 MED ORDER — FLUTICASONE PROPIONATE 50 MCG/ACT NA SUSP: 2.0000 | Freq: Every day | NASAL | 3 refills | Status: DC

## 2018-07-14 NOTE — Telephone Encounter (Signed)
Pharmacy sent refill request for Fluticasone 50 mcg.  Reviewed chart, refill sent to pharmacy. Last office visit 06/23/2018.  Please review and advised. MPulliam, CMA/RT(R)

## 2018-08-07 ENCOUNTER — Encounter: Payer: Self-pay | Admitting: Family Medicine

## 2018-08-08 ENCOUNTER — Other Ambulatory Visit: Payer: Self-pay

## 2018-08-08 DIAGNOSIS — J0191 Acute recurrent sinusitis, unspecified: Secondary | ICD-10-CM

## 2018-08-08 MED ORDER — MONTELUKAST SODIUM 10 MG PO TABS: 10.0000 mg | ORAL_TABLET | Freq: Every day | ORAL | 1 refills | Status: DC

## 2018-08-08 NOTE — Telephone Encounter (Signed)
Patient requesting refill on montelukast.  Medication was last filled by pervious provider. Please review and advise. LOV 06/23/2018.  MPulliam, CMA/RT(R)

## 2018-08-18 ENCOUNTER — Encounter: Payer: Self-pay | Admitting: Family Medicine

## 2018-09-12 ENCOUNTER — Ambulatory Visit (INDEPENDENT_AMBULATORY_CARE_PROVIDER_SITE_OTHER): Payer: PPO

## 2018-09-12 VITALS — BP 94/53 | HR 71

## 2018-09-12 DIAGNOSIS — Z23 Encounter for immunization: Secondary | ICD-10-CM

## 2018-10-13 ENCOUNTER — Encounter: Payer: Self-pay | Admitting: Family Medicine

## 2018-10-24 ENCOUNTER — Ambulatory Visit (INDEPENDENT_AMBULATORY_CARE_PROVIDER_SITE_OTHER): Payer: PPO | Admitting: Family Medicine

## 2018-10-24 ENCOUNTER — Encounter: Payer: Self-pay | Admitting: Family Medicine

## 2018-10-24 VITALS — BP 137/79 | HR 54 | Temp 98.4°F | Ht 65.0 in | Wt 154.0 lb

## 2018-10-24 DIAGNOSIS — J309 Allergic rhinitis, unspecified: Secondary | ICD-10-CM | POA: Diagnosis not present

## 2018-10-24 DIAGNOSIS — F419 Anxiety disorder, unspecified: Secondary | ICD-10-CM

## 2018-10-24 DIAGNOSIS — J3089 Other allergic rhinitis: Secondary | ICD-10-CM

## 2018-10-24 DIAGNOSIS — Z79890 Hormone replacement therapy (postmenopausal): Secondary | ICD-10-CM

## 2018-10-24 DIAGNOSIS — R7303 Prediabetes: Secondary | ICD-10-CM

## 2018-10-24 DIAGNOSIS — K219 Gastro-esophageal reflux disease without esophagitis: Secondary | ICD-10-CM

## 2018-10-24 NOTE — Patient Instructions (Signed)
Please restart your Singulair to take only during the allergy seasons-spring and fall, as we discussed in today's office visit.  Also, sterile saline nasal rinses, such as Milta Deiters med or AYR sinus rinses, can be very helpful and should be done twice daily- especially throughout the allergy season.   Remember you should use distilled water or previously boiled water to do this.  Then you may use over-the-counter Flonase 1 spray each nostril twice daily after sinus rinses.  You can do this in addition to taking any Allegra or Claritin or Zyrtec etc. that you may be taking daily.  If your eyes tend to get an itchy or irritated feeling when your seasonal allergies get bad, you can use Naphcon-A over-the-counter eyedrops as needed

## 2018-10-24 NOTE — Progress Notes (Signed)
Impression and Recommendations:    1. Prediabetes   2. Anxiety   3. Gastroesophageal reflux disease, esophagitis presence not specified   4. Hormone replacement therapy counseling- was on meds for over 20 years.   5. Allergic rhinitis, unspecified seasonality, unspecified trigger   6. Environmental and seasonal allergies     1. Prediabetes - Down to 154 lbs from 157 last visit. - Encouraged patient to continue with weight loss goals.  - Counseled patient on prevention of disease and discussed prudent dietary and lifestyle modifications as first line.  Importance of low carb/ketogenic diet discussed with patient in addition to regular exercise.   2. Mood/Anxiety - Mood stable at this time.  Managed well on treatment. - Continue treatment plan as prescribed.  See med list below. - Patient tolerating meds well without complication.  Denies S-E  - Reviewed the "spokes of the wheel" of mood and health management.  Stressed the importance of ongoing prudent habits, including regular exercise, appropriate sleep hygiene, healthful dietary habits, and prayer/meditation to relax.  3. GERD - Stable on treatment with ranitidine. - Recommended that patient use ranitidine or famotidine for treatment.  See med list below. - Patient tolerating meds well without complication.  Denies S-E.   Advised patient to use famotidine in the future once she is finished with zantac.  4. Weaning off of HRT - HRT on estradiol completely discontinued.  5. Congestion/Allergic Rhinitis - Advised patient to resume Singulair.  - Advised the patient to begin using AYR or Neilmed sinus rinses BID followed by flonase BID (one spray to each nostril).  Advised that the patient may also incorporate allegra or claritin PRN.   - Discussed the effects of decreased atmospheric humidity and decreased home humidity discussed with patient.  Reviewed physiologic effects it has on sinus and nasal passages.    -  Importance of adequate hydration encouraged.  6. Lifestyle & Preventative Health Maintenance - Advised patient to continue working toward exercising to improve overall mental, physical, and emotional health.    American Heart Association guidelines for healthy diet, basically Mediterranean diet, and exercise guidelines of 30 minutes 5 days per week or more discussed in detail.  Health counseling performed.  All questions answered.  - Encouraged patient to engage in daily physical activity, especially a formal exercise routine.  Recommended that the patient eventually strive for at least 150 minutes of moderate cardiovascular activity per week according to guidelines established by the [facility].   - Healthy dietary habits encouraged, including low-carb, and high amounts of lean protein in diet.   - Patient should also consume adequate amounts of water.   Education and routine counseling performed. Handouts provided.  7. Follow-Up - Prescriptions refilled today PRN. - Re-check fasting lab work as recommended. - Otherwise, continue to return for CPE and chronic follow-up as scheduled.   - Patient knows to call in sooner if desired to address acute concerns.   Medications Discontinued During This Encounter  Medication Reason  . estradiol (ESTRACE) 1 MG tablet Patient Preference  . loratadine (CLARITIN) 10 MG tablet Patient Preference  . ranitidine (ZANTAC) 150 MG tablet Patient Preference  . Fexofenadine HCl (ALLEGRA ALLERGY PO) Patient Preference  . montelukast (SINGULAIR) 10 MG tablet Patient Preference     Gross side effects, risk and benefits, and alternatives of medications and treatment plan in general discussed with patient.  Patient is aware that all medications have potential side effects and we are unable to predict every  side effect or drug-drug interaction that may occur.   Patient will call with any questions prior to using medication if they have concerns.  Expresses verbal  understanding and consents to current therapy and treatment regimen.  No barriers to understanding were identified.  Red flag symptoms and signs discussed in detail.  Patient expressed understanding regarding what to do in case of emergency\urgent symptoms  Please see AVS handed out to patient at the end of our visit for further patient instructions/ counseling done pertaining to today's office visit.   Return needs new pic!, for 84mo - GERD, allergies, mood.     Note:  This document was prepared using Dragon voice recognition software and may include unintentional dictation errors.    This document serves as a record of services personally performed by Mellody Dance, DO. It was created on her behalf by Toni Amend, a trained medical scribe. The creation of this record is based on the scribe's personal observations and the provider's statements to them.   I have reviewed the above medical documentation for accuracy and completeness and I concur.  Mellody Dance, DO 10/25/2018 5:36 PM      -------------------------------------------------------------------------------------------------------------------    Subjective:    CC:  Chief Complaint  Patient presents with  . Follow-up    HPI: Daisy Nelson is a 72 y.o. female who presents to [place] [facility] today for follow-up of mood.   Congestion after discontinued Singulair use Quit taking Singulair last week.  She felt that Singulair was "drying her out."  Now she is a little congested.  States her congestion and scratchy throat hit her on Thursday evening (4 days ago).  She discontinued use of Singulair the Tuesday prior (6 days ago).  Prediabetes Patient feels she's doing well with her weight. She is down to 154 lbs from 157 last visit.  Mood Stable and managed well on Zoloft.  Mood has been good because, as patient states, she continues on medication as prescribed. She feels it is  working well for her.  GERD Increased Zantac last appointment.  Patient purchased famotidine, but still had Zantac tablets.  States she found that the "possible cancer thing" was in the Zantac capsules, and decided to finish out her Zantac tablets.  She plans to use the famotidine in the future.  Weaning off of HRT Has been tapering off of estradiol.  Patient is completely off of estradiol now.  Finished about two months ago.  States she's doing well.  "Sometimes, I might have a little hot flash.  I have had a few night sweats, but they're nothing that I can't deal with."  Says she set up a little fan beside her bed, and uses that for relief.  Has been on HRT for about twenty years.  Had her hysterectomy in her 60's.   Depression screen [place] 2/9 10/24/2018 06/23/2018 06/16/2018  Decreased Interest 0 0 0  Down, Depressed, Hopeless 0 0 0  PHQ - 2 Score 0 0 0  Altered sleeping 1 1 1   Tired, decreased energy 0 0 0  Change in appetite 0 0 0  Feeling bad or failure about yourself  0 0 0  Trouble concentrating 0 0 0  Moving slowly or fidgety/restless 0 0 0  Suicidal thoughts 0 0 0  PHQ-9 Score 1 1 1   Difficult doing work/chores Not difficult at all Not difficult at all -  Some recent data might be hidden     GAD 7 :  Generalized Anxiety Score 10/24/2018 06/23/2018  Nervous, Anxious, on Edge 0 0  Control/stop worrying 1 0  Worry too much - different things 0 0  Trouble relaxing 0 0  Restless 0 0  Easily annoyed or irritable 0 0  Afraid - awful might happen 0 0  Total GAD 7 Score 1 0     Wt Readings from Last 3 Encounters:  10/24/18 154 lb (69.9 kg)  06/23/18 157 lb (71.2 kg)  06/16/18 156 lb (70.8 kg)   BP Readings from Last 3 Encounters:  10/24/18 137/79  09/12/18 (!) 94/53  06/23/18 121/68   Pulse Readings from Last 3 Encounters:  10/24/18 (!) 54  09/12/18 71  06/23/18 (!) 57   BMI Readings from Last 3 Encounters:  10/24/18 25.63 kg/m  06/23/18 26.13 kg/m    06/16/18 25.96 kg/m         Patient Care Team    Relationship Specialty Notifications Start End  Mellody Dance, DO PCP - General Family Medicine  05/10/18   Pixie Casino, MD Consulting Physician Cardiology  11/14/13   Vevelyn Royals, MD Consulting Physician Ophthalmology  07/18/16   [name], MD Consulting Physician Ophthalmology  07/18/16   Carol Ada, MD Consulting Physician Gastroenterology  09/22/16      Patient Active Problem List   Diagnosis Date Noted  . Prediabetes 03/26/2015    Priority: High  . Atrial fibrillation with rapid ventricular response ([place]) 02/06/2013    Priority: High  . Hyperlipidemia 10/25/2012    Priority: High  . hx tob abuse;  30 yrs at roughly 1/2 ppd 05/11/2018    Priority: Medium  . Drug-induced bradycardia-  rate controlled on metoprolol by cards, patient ASx 05/10/2018    Priority: Medium  . Hormone replacement therapy 03/23/2017    Priority: Medium  . GERD (gastroesophageal reflux disease) 10/25/2012    Priority: Medium  . Anxiety 10/25/2012    Priority: Medium  . Family history of coronary artery bypass surgery 02/06/2013    Priority: Low  . DJD (degenerative joint disease) of cervical spine 10/25/2012    Priority: Low  . Colon polyps 10/25/2012    Priority: Low  . Environmental and seasonal allergies 10/24/2018  . Depression 05/10/2018  . Preretinal fibrosis, right eye 08/25/2016  . PAF (paroxysmal atrial fibrillation) ([place]) 07/10/2013  . Allergic rhinitis 10/25/2012  . Nephrolithiasis 10/25/2012  . Complication of anesthesia 11/30/2009    Past Medical history, Surgical history, Family history, Social history, Allergies and Medications have been entered into the medical record, reviewed and changed as needed.    Current Meds  Medication Sig  . atorvastatin (LIPITOR) 40 MG tablet TAKE 1 TABLET BY MOUTH ONCE DAILY  . flecainide (TAMBOCOR) 100 MG tablet Take 2 tablets by mouth with onset of atrial fib. No  more than once a week  . fluticasone (FLONASE) 50 MCG/ACT nasal spray Place 2 sprays into both nostrils daily.  . metoprolol tartrate (LOPRESSOR) 25 MG tablet Take 0.5 tablets (12.5 mg total) by mouth 2 (two) times daily.  . sertraline (ZOLOFT) 100 MG tablet Take 1 tablet by mouth daily.   Alveda Reasons 20 MG TABS tablet TAKE 1 TABLET BY MOUTH ONCE DAILY WITH SUPPER    Allergies:  Allergies  Allergen Reactions  . Adhesive [Tape]     Burn skin  . Erythromycin     Gi intolerance - nausea, vomiting, and abd pains  . Naproxen Nausea And Vomiting    Abdominal pain   . Penicillins  Hives    Has patient had a PCN reaction causing immediate rash, facial/tongue/throat swelling, SOB or lightheadedness with hypotension: UNKNOWN Has patient had a PCN reaction causing severe rash involving mucus membranes or skin necrosis: No Has patient had a PCN reaction that required hospitalization No Has patient had a PCN reaction occurring within the last 10 years: No If all of the above answers are "NO", then may proceed with Cephalosporin use.      Review of Systems: Review of Systems: General:   No F/C, wt loss Pulm:   No DIB, SOB, pleuritic chest pain Card:  No CP, palpitations Abd:  No n/v/d or pain Ext:  No inc edema from baseline Psych: no SI/ HI    Objective:   Blood pressure 137/79, pulse (!) 54, temperature 98.4 F (36.9 C), height 5\' 5"  (1.651 m), weight 154 lb (69.9 kg), SpO2 99 %. Body mass index is 25.63 kg/m. General:  Well Developed, well nourished, appropriate for stated age.  Neuro:  Alert and oriented,  extra-ocular muscles intact  HEENT:  Normocephalic, atraumatic, neck supple, no carotid bruits appreciated  Skin:  no gross rash, warm, pink. Cardiac:  RRR, S1 S2 Respiratory:  ECTA B/L and A/P, Not using accessory muscles, speaking in full sentences- unlabored. Vascular:  Ext warm, no cyanosis apprec.; cap RF less 2 sec. Psych:  No HI/SI, judgement and insight good, Euthymic  mood. Full Affect.

## 2018-10-31 ENCOUNTER — Other Ambulatory Visit: Payer: Self-pay | Admitting: Internal Medicine

## 2018-12-01 ENCOUNTER — Encounter: Payer: Self-pay | Admitting: Family Medicine

## 2018-12-02 ENCOUNTER — Other Ambulatory Visit: Payer: Self-pay | Admitting: Adult Health

## 2018-12-02 ENCOUNTER — Telehealth: Payer: Self-pay | Admitting: Family Medicine

## 2018-12-02 NOTE — Telephone Encounter (Signed)
Patient called states Daisy Nelson told her to get Nizatidine OTC, per patient, pharmacy advised her that Rx is required & she doesn't know what to do. (pls see below)  --Good Morning Daisy Nelson,  I reviewed her chart-  Please call pt and tell her to stop famotidine- due to reaction  Recommend OTC Nizatidine 150mg  BID for up to 12 weeks to treat GERD  Thanks! Daisy Nelson   --Advised pt provider is out of the office and would forward message to medical assistant for review on Monday. & someone would call her with instructions.  --glh

## 2018-12-02 NOTE — Progress Notes (Signed)
Stop famotidine- pt having reaction Recommend OTC Nizatidine 150mg  BID for up to 12 weeks to treat GERD She was previously on ranitidine

## 2018-12-05 NOTE — Telephone Encounter (Signed)
Called and notified patient that per Dr. Raliegh Scarlet advised to change to Cimetidine (Tagamet) and to follow up with the office on how this medication works for her. MPulliam, CMA/RT(R)

## 2019-01-23 ENCOUNTER — Other Ambulatory Visit: Payer: Self-pay | Admitting: Internal Medicine

## 2019-02-22 ENCOUNTER — Other Ambulatory Visit: Payer: Self-pay

## 2019-02-22 ENCOUNTER — Telehealth (INDEPENDENT_AMBULATORY_CARE_PROVIDER_SITE_OTHER): Payer: PPO | Admitting: Family Medicine

## 2019-02-22 DIAGNOSIS — F419 Anxiety disorder, unspecified: Secondary | ICD-10-CM | POA: Diagnosis not present

## 2019-02-22 DIAGNOSIS — R7303 Prediabetes: Secondary | ICD-10-CM | POA: Diagnosis not present

## 2019-02-22 DIAGNOSIS — J3089 Other allergic rhinitis: Secondary | ICD-10-CM | POA: Diagnosis not present

## 2019-02-22 DIAGNOSIS — J309 Allergic rhinitis, unspecified: Secondary | ICD-10-CM

## 2019-02-22 DIAGNOSIS — K219 Gastro-esophageal reflux disease without esophagitis: Secondary | ICD-10-CM | POA: Diagnosis not present

## 2019-02-22 MED ORDER — BLOOD GLUCOSE MONITOR KIT: PACK | 1 refills | Status: DC

## 2019-02-22 MED ORDER — SERTRALINE HCL 100 MG PO TABS: 100.0000 mg | ORAL_TABLET | Freq: Every day | ORAL | 1 refills | Status: DC

## 2019-02-22 NOTE — Progress Notes (Signed)
Virtual Visit via Telephone Note for [employer], D.O- Primary Care Physician at [facility]   I connected [phone], 73 y.o. female  _0  TIME@ by telephone and verified that I am speaking with the correct person using two identifiers.   I discussed the limitations, risks, security and privacy concerns of performing an evaluation and management service by telephone and the limited availability of in person appointments during this current national crisis of the Covid-19 pandemic.  My staff members also discussed with the patient that there may be a patient responsible charge related to this service.  The patient expressed understanding and agreed to proceed.     History of Present Illness:  HPI:   GERD:  Changed from ranitidine to famotidine last OV in nev 2019.  Has changes diet   Allergies: better with added allegra- but still with RN and sneezing.   NOt feeling poorly just a nuisance.  Is using sinus rinses   Anxiety/ mood: taking Zoloft and working well and not having panic attacks   Staying active- working around the yard etc. no formal exercise   prediabetes: Patient has no complaints today.   She has not really paid attention to her sugars at all at home.  She was coming in every 6 months to get it checked and when we last checked it back in April 2019 it jumped from 6.1 up to 6.4 at that time.  Patient was told to follow-up in 6 months and never did in this past October.  Since patient does not want to come in to the office to give blood due to the pandemic at this time, I recommend she get a glucometer and we will send in that prescription and she was told to check 2-hour postprandial as well as fasting blood sugars.   Wt Readings from Last 3 Encounters:  10/24/18 154 lb (69.9 kg)  06/23/18 157 lb (71.2 kg)  06/16/18 156 lb (70.8 kg)   BP Readings from Last 3 Encounters:  10/24/18 137/79  09/12/18 (!) 94/53  06/23/18 121/68   Pulse Readings from Last 3  Encounters:  10/24/18 (!) 54  09/12/18 71  06/23/18 (!) 57   BMI Readings from Last 3 Encounters:  10/24/18 25.63 kg/m  06/23/18 26.13 kg/m  06/16/18 25.96 kg/m     Patient Care Team    Relationship Specialty Notifications Start End  Mellody Dance, DO PCP - General Family Medicine  05/10/18   Pixie Casino, MD Consulting Physician Cardiology  11/14/13   Vevelyn Royals, MD Consulting Physician Ophthalmology  07/18/16   [name], MD Consulting Physician Ophthalmology  07/18/16   Carol Ada, MD Consulting Physician Gastroenterology  09/22/16      Patient Active Problem List   Diagnosis Date Noted  . Prediabetes 03/26/2015    Priority: High  . Atrial fibrillation with rapid ventricular response ([place]) 02/06/2013    Priority: High  . Hyperlipidemia 10/25/2012    Priority: High  . hx tob abuse;  30 yrs at roughly 1/2 ppd 05/11/2018    Priority: Medium  . Drug-induced bradycardia-  rate controlled on metoprolol by cards, patient ASx 05/10/2018    Priority: Medium  . Hormone replacement therapy 03/23/2017    Priority: Medium  . GERD (gastroesophageal reflux disease) 10/25/2012    Priority: Medium  . Anxiety 10/25/2012    Priority: Medium  . Family history of coronary artery bypass surgery 02/06/2013    Priority: Low  . DJD (degenerative joint  disease) of cervical spine 10/25/2012    Priority: Low  . Colon polyps 10/25/2012    Priority: Low  . Environmental and seasonal allergies 10/24/2018  . Depression 05/10/2018  . Preretinal fibrosis, right eye 08/25/2016  . PAF (paroxysmal atrial fibrillation) ([place]) 07/10/2013  . Allergic rhinitis 10/25/2012  . Nephrolithiasis 10/25/2012  . Complication of anesthesia 11/30/2009     Current Meds  Medication Sig  . fexofenadine (ALLEGRA) 180 MG tablet Take 180 mg by mouth daily.     Allergies:  Allergies  Allergen Reactions  . Adhesive [Tape]     Burn skin  . Erythromycin     Gi intolerance - nausea,  vomiting, and abd pains  . Naproxen Nausea And Vomiting    Abdominal pain   . Penicillins Hives    Has patient had a PCN reaction causing immediate rash, facial/tongue/throat swelling, SOB or lightheadedness with hypotension: UNKNOWN Has patient had a PCN reaction causing severe rash involving mucus membranes or skin necrosis: No Has patient had a PCN reaction that required hospitalization No Has patient had a PCN reaction occurring within the last 10 years: No If all of the above answers are "NO", then may proceed with Cephalosporin use.      ROS:  See above HPI for pertinent positives and negatives   Objective:   Vitals: 118/61, 53,   Wt 152.6; temp- 97.6   General: sounds in no acute distress.  Skin: Pt confirms warm and dry  extremities and pink fingertips Respiratory: speaking in full sentences, no conversational dyspnea Psych: A and O *3, appears insight good, mood- full      Impression and Recommendations:     1. Anxiety   2. Gastroesophageal reflux disease, esophagitis presence not specified   3. Allergic rhinitis, unspecified seasonality, unspecified trigger   4. Environmental and seasonal allergies   5. Prediabetes      Anxiety - Plan: sertraline (ZOLOFT) 100 MG tablet -New prescription sent in at patient request since she is moving everything to [facility].  She has been feeling well and has no acute symptoms at this time.  Continue current treatment plan.  We would did discuss dietary and lifestyle modifications and stress management techniques.  Gastroesophageal reflux disease, esophagitis presence not specified -Switching from ranitidine to famotidine patient's symptoms are well controlled now.  We also did discuss lifestyle changes in addition to these medication months.  Allergic rhinitis, unspecified seasonality, unspecified trigger/  Environmental and seasonal allergies -Advised patient to continue her Allegra, and Flonase after sinus rinses twice  daily. -She may start her Singulair that she has sitting at home if needed but she does not feel she needs it at this time. -I encouraged after any prolonged exposure outside for her to do sinus rinses as soon as she comes into the home. -She will let us know if the symptoms worsen and if she needs an additional prescription of Singulair in the future if needed  Prediabetes - Plan: blood glucose meter kit and supplies KIT -Asked patient to check her fasting blood sugars as well as her 2-hour postprandial every day.  We discussed parameters and normal ranges. -Patient declines coming in to get an A1c test at this time and wants to avoid coming into the office is much as possible. -She will call and let us know in one 1 month if these numbers are not at goal otherwise   Otherwise: we will see her in 4 months for a wellness exam including a full set of  fasting blood work which she will be due for  I discussed the assessment and treatment plan with the patient. The patient was provided an opportunity to ask questions and all were answered. The patient agreed with the plan and demonstrated an understanding of the instructions.   The patient was advised to call back or seek an in-person evaluation if the symptoms worsen or if the condition fails to improve as anticipated.    Meds ordered this encounter  Medications  . sertraline (ZOLOFT) 100 MG tablet    Sig: Take 1 tablet (100 mg total) by mouth daily.    Dispense:  90 tablet    Refill:  1  . blood glucose meter kit and supplies KIT    Sig: Dispense based on patient and insurance preference. Use twice daily for fasting blood sugar and 2 hours after largest meal    Dispense:  1 each    Refill:  1    Order Specific Question:   Number of strips    Answer:   180    Order Specific Question:   Number of lancets    Answer:   180    Medications Discontinued During This Encounter  Medication Reason  . sertraline (ZOLOFT) 100 MG tablet Reorder      Gross side effects, risk and benefits, and alternatives of medications and treatment plan in general discussed with patient.  Patient is aware that all medications have potential side effects and we are unable to predict every side effect or drug-drug interaction that may occur.   Patient was strongly encouraged to call with any questions or concerns they may have concerns.    Expresses verbal understanding and consents to current therapy and treatment regimen.  No barriers to understanding were identified.  Red flag symptoms and signs discussed in detail.  Patient expressed understanding regarding what to do in case of emergency\urgent symptoms  Please see AVS handed out to patient at the end of our visit for further patient instructions/ counseling done pertaining to today's office visit.  I provided 18.9 minutes of non-face-to-face time during this encounter.     Mellody Dance, DO

## 2019-02-25 DIAGNOSIS — R69 Illness, unspecified: Secondary | ICD-10-CM | POA: Diagnosis not present

## 2019-03-07 ENCOUNTER — Other Ambulatory Visit: Payer: Self-pay

## 2019-03-07 ENCOUNTER — Encounter: Payer: Self-pay | Admitting: Family Medicine

## 2019-03-07 DIAGNOSIS — E785 Hyperlipidemia, unspecified: Secondary | ICD-10-CM

## 2019-03-07 MED ORDER — ATORVASTATIN CALCIUM 40 MG PO TABS: 40.0000 mg | ORAL_TABLET | Freq: Every day | ORAL | 0 refills | Status: DC

## 2019-03-07 NOTE — Telephone Encounter (Signed)
Patient requesting a refill on Lipitor, medication was last filled by a pervious provider.  LOV 02/22/2019.  Please review and advise. MPulliam, CMA/RT(R)

## 2019-03-09 ENCOUNTER — Other Ambulatory Visit: Payer: Self-pay

## 2019-03-09 ENCOUNTER — Telehealth: Payer: Self-pay

## 2019-03-09 DIAGNOSIS — I480 Paroxysmal atrial fibrillation: Secondary | ICD-10-CM

## 2019-03-09 MED ORDER — FLECAINIDE ACETATE 100 MG PO TABS: ORAL_TABLET | ORAL | 5 refills | Status: DC

## 2019-03-09 NOTE — Telephone Encounter (Signed)
Contacted patient to reschedule her appointment if agreed.  Spoke with patient and she stated that she was doing well and agree to be reschedule and she also needed a refill for her flecainide.     Cardiac Questionnaire:    Since your last visit or hospitalization:    1. Have you been having new or worsening chest pain? NO   2. Have you been having new or worsening shortness of breath? NO 3. Have you been having new or worsening leg swelling, wt gain, or increase in abdominal girth (pants fitting more tightly)? NO   4. Have you had any passing out spells? NO    RESCHEDULE HER FOR July 30TH AT 10:30 AM AND SENT IN A REFILL FOR FLECAINIDE.

## 2019-03-09 NOTE — Telephone Encounter (Signed)
Patient is returning your call.  

## 2019-03-09 NOTE — Telephone Encounter (Signed)
LEFT MESSAGE TO CALL BACK

## 2019-03-15 ENCOUNTER — Encounter: Payer: Self-pay | Admitting: Family Medicine

## 2019-03-16 ENCOUNTER — Ambulatory Visit: Payer: PPO | Admitting: Internal Medicine

## 2019-03-22 ENCOUNTER — Other Ambulatory Visit: Payer: Self-pay

## 2019-03-22 ENCOUNTER — Other Ambulatory Visit (INDEPENDENT_AMBULATORY_CARE_PROVIDER_SITE_OTHER): Payer: PPO

## 2019-03-22 DIAGNOSIS — R7309 Other abnormal glucose: Secondary | ICD-10-CM | POA: Diagnosis not present

## 2019-03-22 DIAGNOSIS — Z0000 Encounter for general adult medical examination without abnormal findings: Secondary | ICD-10-CM | POA: Diagnosis not present

## 2019-03-22 DIAGNOSIS — E559 Vitamin D deficiency, unspecified: Secondary | ICD-10-CM | POA: Diagnosis not present

## 2019-03-22 DIAGNOSIS — Z79890 Hormone replacement therapy (postmenopausal): Secondary | ICD-10-CM | POA: Diagnosis not present

## 2019-03-22 DIAGNOSIS — E785 Hyperlipidemia, unspecified: Secondary | ICD-10-CM | POA: Diagnosis not present

## 2019-03-22 DIAGNOSIS — R7303 Prediabetes: Secondary | ICD-10-CM | POA: Diagnosis not present

## 2019-03-23 ENCOUNTER — Other Ambulatory Visit: Payer: Self-pay | Admitting: Family Medicine

## 2019-03-23 DIAGNOSIS — E559 Vitamin D deficiency, unspecified: Secondary | ICD-10-CM

## 2019-03-23 DIAGNOSIS — Z780 Asymptomatic menopausal state: Secondary | ICD-10-CM

## 2019-03-23 LAB — COMPREHENSIVE METABOLIC PANEL
ALT: 25 IU/L (ref 0–32)
AST: 25 IU/L (ref 0–40)
Albumin/Globulin Ratio: 1.8 (ref 1.2–2.2)
Albumin: 4.2 g/dL (ref 3.7–4.7)
Alkaline Phosphatase: 98 IU/L (ref 39–117)
BUN/Creatinine Ratio: 17 (ref 12–28)
BUN: 15 mg/dL (ref 8–27)
Bilirubin Total: 0.4 mg/dL (ref 0.0–1.2)
CO2: 27 mmol/L (ref 20–29)
Calcium: 9.4 mg/dL (ref 8.7–10.3)
Chloride: 100 mmol/L (ref 96–106)
Creatinine, Ser: 0.88 mg/dL (ref 0.57–1.00)
GFR calc Af Amer: 76 mL/min/{1.73_m2} (ref >59)
GFR calc non Af Amer: 66 mL/min/{1.73_m2} (ref >59)
Globulin, Total: 2.4 g/dL (ref 1.5–4.5)
Glucose: 130 mg/dL — ABNORMAL HIGH (ref 65–99)
Potassium: 4.2 mmol/L (ref 3.5–5.2)
Sodium: 139 mmol/L (ref 134–144)
Total Protein: 6.6 g/dL (ref 6.0–8.5)

## 2019-03-23 LAB — VITAMIN D 25 HYDROXY (VIT D DEFICIENCY, FRACTURES): Vit D, 25-Hydroxy: 11.2 ng/mL — ABNORMAL LOW (ref 30.0–100.0)

## 2019-03-23 LAB — HEMOGLOBIN A1C
Est. average glucose Bld gHb Est-mCnc: 137 mg/dL
Hgb A1c MFr Bld: 6.4 % — ABNORMAL HIGH (ref 4.8–5.6)

## 2019-03-23 LAB — CBC WITH DIFFERENTIAL/PLATELET
Basophils Absolute: 0 10*3/uL (ref 0.0–0.2)
Basos: 1 %
EOS (ABSOLUTE): 0.1 10*3/uL (ref 0.0–0.4)
Eos: 3 %
Hematocrit: 40.3 % (ref 34.0–46.6)
Hemoglobin: 13.5 g/dL (ref 11.1–15.9)
Immature Grans (Abs): 0 10*3/uL (ref 0.0–0.1)
Immature Granulocytes: 1 %
Lymphocytes Absolute: 1 10*3/uL (ref 0.7–3.1)
Lymphs: 22 %
MCH: 29.9 pg (ref 26.6–33.0)
MCHC: 33.5 g/dL (ref 31.5–35.7)
MCV: 89 fL (ref 79–97)
Monocytes Absolute: 0.7 10*3/uL (ref 0.1–0.9)
Monocytes: 15 %
Neutrophils Absolute: 2.8 10*3/uL (ref 1.4–7.0)
Neutrophils: 58 %
Platelets: 170 10*3/uL (ref 150–450)
RBC: 4.52 x10E6/uL (ref 3.77–5.28)
RDW: 13.4 % (ref 11.7–15.4)
WBC: 4.7 10*3/uL (ref 3.4–10.8)

## 2019-03-23 LAB — TSH: TSH: 2.23 u[IU]/mL (ref 0.450–4.500)

## 2019-03-23 LAB — LIPID PANEL
Chol/HDL Ratio: 2.2 ratio (ref 0.0–4.4)
Cholesterol, Total: 135 mg/dL (ref 100–199)
HDL: 61 mg/dL (ref >39)
LDL Calculated: 56 mg/dL (ref 0–99)
Triglycerides: 92 mg/dL (ref 0–149)
VLDL Cholesterol Cal: 18 mg/dL (ref 5–40)

## 2019-03-23 LAB — T4, FREE: Free T4: 1.1 ng/dL (ref 0.82–1.77)

## 2019-03-23 LAB — VITAMIN B12: Vitamin B-12: 407 pg/mL (ref 232–1245)

## 2019-03-23 MED ORDER — VITAMIN D (ERGOCALCIFEROL) 1.25 MG (50000 UNIT) PO CAPS: ORAL_CAPSULE | ORAL | 3 refills | Status: DC

## 2019-04-25 MED ORDER — METOPROLOL TARTRATE 25 MG PO TABS: 12.5000 mg | ORAL_TABLET | Freq: Two times a day (BID) | ORAL | 0 refills | Status: DC

## 2019-04-26 ENCOUNTER — Other Ambulatory Visit: Payer: Self-pay | Admitting: Internal Medicine

## 2019-06-05 ENCOUNTER — Other Ambulatory Visit: Payer: Self-pay | Admitting: Family Medicine

## 2019-06-05 DIAGNOSIS — Z1231 Encounter for screening mammogram for malignant neoplasm of breast: Secondary | ICD-10-CM

## 2019-06-05 DIAGNOSIS — E785 Hyperlipidemia, unspecified: Secondary | ICD-10-CM

## 2019-06-12 ENCOUNTER — Ambulatory Visit (INDEPENDENT_AMBULATORY_CARE_PROVIDER_SITE_OTHER): Payer: PPO | Admitting: Family Medicine

## 2019-06-12 ENCOUNTER — Encounter: Payer: Self-pay | Admitting: Family Medicine

## 2019-06-12 ENCOUNTER — Other Ambulatory Visit: Payer: PPO

## 2019-06-12 ENCOUNTER — Other Ambulatory Visit: Payer: Self-pay

## 2019-06-12 VITALS — Ht 65.0 in | Wt 154.0 lb

## 2019-06-12 DIAGNOSIS — R7303 Prediabetes: Secondary | ICD-10-CM

## 2019-06-12 DIAGNOSIS — Z0000 Encounter for general adult medical examination without abnormal findings: Secondary | ICD-10-CM

## 2019-06-12 DIAGNOSIS — Z780 Asymptomatic menopausal state: Secondary | ICD-10-CM | POA: Diagnosis not present

## 2019-06-12 DIAGNOSIS — E785 Hyperlipidemia, unspecified: Secondary | ICD-10-CM | POA: Diagnosis not present

## 2019-06-12 DIAGNOSIS — I10 Essential (primary) hypertension: Secondary | ICD-10-CM

## 2019-06-12 DIAGNOSIS — E559 Vitamin D deficiency, unspecified: Secondary | ICD-10-CM | POA: Diagnosis not present

## 2019-06-12 DIAGNOSIS — F419 Anxiety disorder, unspecified: Secondary | ICD-10-CM

## 2019-06-12 DIAGNOSIS — E1159 Type 2 diabetes mellitus with other circulatory complications: Secondary | ICD-10-CM | POA: Insufficient documentation

## 2019-06-12 DIAGNOSIS — I152 Hypertension secondary to endocrine disorders: Secondary | ICD-10-CM | POA: Insufficient documentation

## 2019-06-12 DIAGNOSIS — I4891 Unspecified atrial fibrillation: Secondary | ICD-10-CM | POA: Diagnosis not present

## 2019-06-12 MED ORDER — ATORVASTATIN CALCIUM 40 MG PO TABS: 40.0000 mg | ORAL_TABLET | Freq: Every day | ORAL | 1 refills | Status: DC

## 2019-06-12 NOTE — Progress Notes (Signed)
Telehealth office visit note for Daisy Nelson, D.O- at Primary Care at [facility]   I connected with current patient today and verified that I am speaking with the correct person using two identifiers.   . Location of the patient: Home . Location of the provider: Office Only the patient (+/- their family members at pt's discretion) and myself were participating in the encounter    - This visit type was conducted due to national recommendations for restrictions regarding the COVID-19 Pandemic (e.g. social distancing) in an effort to limit this patient's exposure and mitigate transmission in our community.  This format is felt to be most appropriate for this patient at this time.   - The patient did not have access to video technology or had technical difficulties with video requiring transitioning to audio format only. - No physical exam could be performed with this format, beyond that communicated to [place] by the patient/ family members as noted.   - Additionally my office staff/ schedulers discussed with the patient that there may be a monetary charge related to this service, depending on their medical insurance.   The patient expressed understanding, and agreed to proceed.       History of Present Illness:  BP:  Not checking at home.  Feels great.   Chol:  Last checked in April 2020. Well controlled.  Mgt on statin.    Mood: taking zoloft  Vit D def:   Was just over 11 in April 20- doesn't feel much different.   Feels better than she did prior.   Impression and Recommendations:    1. Hyperlipidemia, unspecified hyperlipidemia type   2. Atrial fibrillation with rapid ventricular response ([place])   3. Hypertension, unspecified type   4. Prediabetes   5. Anxiety     -Cholesterol is up-to-date and within normal limits -No symptoms or signs of A. fib per patient. -Not checking blood pressure at home so we have no idea what it is been running.  Advised patient to check 1-2  times a week regularly irregardless of whether or not she is having symptoms or feeling good or bad.  She was told to call the office if it remains higher than desired- above 140/90 on a regular basis. -Prediabetes: We will check her A1c as labs are drawn this morning Vitamin D deficiency: She has taken vitamin D prescription once weekly for about 6 8 months now.  We will recheck vitamin D levels today-labs already drawn -Anxiety/mood: Well controlled on current dose of sertraline.  Will continue.  - As part of my medical decision making, I reviewed the following data within the [place] History obtained from pt /family, CMA notes reviewed and incorporated if applicable, Labs reviewed, Radiograph/ tests reviewed if applicable and OV notes from prior OV's with me, as well as other specialists she/he has seen since seeing me last, were all reviewed and used in my medical decision making process today.   - Additionally, discussion had with patient regarding txmnt plan, and their biases/concerns about that plan were used in my medical decision making today.   - The patient agreed with the plan and demonstrated an understanding of the instructions.   No barriers to understanding were identified.   - Red flag symptoms and signs discussed in detail.  Patient expressed understanding regarding what to do in case of emergency\ urgent symptoms.  The patient was advised to call back or seek an in-person evaluation if the  symptoms worsen or if the condition fails to improve as anticipated.   Return for 32mof/up medicare wellness.    Meds ordered this encounter  Medications  . atorvastatin (LIPITOR) 40 MG tablet    Sig: Take 1 tablet (40 mg total) by mouth at bedtime.    Dispense:  90 tablet    Refill:  1    NO REFILLS - needs OFFICE VISIT AND BLDWRK!    Medications Discontinued During This Encounter  Medication Reason  . atorvastatin (LIPITOR) 40 MG tablet Reorder    I provided 18  minutes of non-face-to-face time during this encounter,with over 50% of the time in direct counseling on patients medical conditions/ medical concerns.  Additional time was spent with charting and coordination of care after the actual visit commenced.   Note:  This note was prepared with assistance of Dragon voice recognition software. Occasional wrong-word or sound-a-like substitutions may have occurred due to the inherent limitations of voice recognition software.  DMellody Dance DO 06/12/2019 2:49 PM    Patient Care Team    Relationship Specialty Notifications Start End  OMellody Dance DO PCP - General Family Medicine  05/10/18   HPixie Casino MD Consulting Physician Cardiology  11/14/13   SVevelyn Royals MD Consulting Physician Ophthalmology  07/18/16   MHayden Pedro MD Consulting Physician Ophthalmology  07/18/16   HCarol Ada MD Consulting Physician Gastroenterology  09/22/16      -Vitals obtained; medications/ allergies reconciled;  personal medical, social, Sx etc.histories were updated by CMA, reviewed by me and are reflected in chart   Patient Active Problem List   Diagnosis Date Noted  . Prediabetes 03/26/2015    Priority: High  . Atrial fibrillation with rapid ventricular response ([place] 02/06/2013    Priority: High  . Hyperlipidemia 10/25/2012    Priority: High  . hx tob abuse;  30 yrs at roughly 1/2 ppd 05/11/2018    Priority: Medium  . Drug-induced bradycardia-  rate controlled on metoprolol by cards, patient ASx 05/10/2018    Priority: Medium  . Hormone replacement therapy 03/23/2017    Priority: Medium  . GERD (gastroesophageal reflux disease) 10/25/2012    Priority: Medium  . Anxiety 10/25/2012    Priority: Medium  . Family history of coronary artery bypass surgery 02/06/2013    Priority: Low  . DJD (degenerative joint disease) of cervical spine 10/25/2012    Priority: Low  . Colon polyps 10/25/2012    Priority: Low  . Hypertension  06/12/2019  . Environmental and seasonal allergies 10/24/2018  . Depression 05/10/2018  . Preretinal fibrosis, right eye 08/25/2016  . PAF (paroxysmal atrial fibrillation) ([place] 07/10/2013  . Allergic rhinitis 10/25/2012  . Nephrolithiasis 10/25/2012  . Complication of anesthesia 11/30/2009     Current Meds  Medication Sig  . atorvastatin (LIPITOR) 40 MG tablet Take 1 tablet (40 mg total) by mouth at bedtime.  . blood glucose meter kit and supplies KIT Dispense based on patient and insurance preference. Use twice daily for fasting blood sugar and 2 hours after largest meal  . fexofenadine (ALLEGRA) 180 MG tablet Take 180 mg by mouth daily.  . flecainide (TAMBOCOR) 100 MG tablet Take 2 tablets by mouth with onset of atrial fib. No more than once a week  . fluticasone (FLONASE) 50 MCG/ACT nasal spray Place 2 sprays into both nostrils daily.  . metoprolol tartrate (LOPRESSOR) 25 MG tablet Take 0.5 tablets (12.5 mg total) by mouth 2 (two) times daily.  . sertraline (ZOLOFT) 100  MG tablet Take 1 tablet (100 mg total) by mouth daily.  . Vitamin D, Ergocalciferol, (DRISDOL) 1.25 MG (50000 UT) CAPS capsule Take one tablet wkly  . XARELTO 20 MG TABS tablet TAKE 1 TABLET BY MOUTH ONCE DAILY WITH SUPPER  . [DISCONTINUED] atorvastatin (LIPITOR) 40 MG tablet Take 1 tablet (40 mg total) by mouth daily at 6 PM for 7 days.     Allergies:  Allergies  Allergen Reactions  . Adhesive [Tape]     Burn skin  . Erythromycin     Gi intolerance - nausea, vomiting, and abd pains  . Naproxen Nausea And Vomiting    Abdominal pain   . Penicillins Hives    Has patient had a PCN reaction causing immediate rash, facial/tongue/throat swelling, SOB or lightheadedness with hypotension: UNKNOWN Has patient had a PCN reaction causing severe rash involving mucus membranes or skin necrosis: No Has patient had a PCN reaction that required hospitalization No Has patient had a PCN reaction occurring within the last 10  years: No If all of the above answers are "NO", then may proceed with Cephalosporin use.      ROS:  See above HPI for pertinent positives and negatives   Objective:   Height '5\' 5"'  (1.651 m), weight 154 lb (69.9 kg).  (if some vitals are omitted, this means that patient was UNABLE to obtain them even though they were asked to get them prior to OV today.  They were asked to call us at their earliest convenience with these once obtained. )  General: A & O * 3; sounds in no acute distress; in usual state of health.  Skin: Pt confirms warm and dry extremities and pink fingertips HEENT: Pt confirms lips non-cyanotic Chest: Patient confirms normal chest excursion and movement Respiratory: speaking in full sentences, no conversational dyspnea; patient confirms no use of accessory muscles Psych: insight appears good, mood- appears full

## 2019-06-13 LAB — COMPREHENSIVE METABOLIC PANEL
ALT: 28 IU/L (ref 0–32)
AST: 24 IU/L (ref 0–40)
Albumin/Globulin Ratio: 1.8 (ref 1.2–2.2)
Albumin: 4.1 g/dL (ref 3.7–4.7)
Alkaline Phosphatase: 99 IU/L (ref 39–117)
BUN/Creatinine Ratio: 15 (ref 12–28)
BUN: 13 mg/dL (ref 8–27)
Bilirubin Total: 0.4 mg/dL (ref 0.0–1.2)
CO2: 24 mmol/L (ref 20–29)
Calcium: 9.2 mg/dL (ref 8.7–10.3)
Chloride: 101 mmol/L (ref 96–106)
Creatinine, Ser: 0.87 mg/dL (ref 0.57–1.00)
GFR calc Af Amer: 78 mL/min/{1.73_m2} (ref >59)
GFR calc non Af Amer: 67 mL/min/{1.73_m2} (ref >59)
Globulin, Total: 2.3 g/dL (ref 1.5–4.5)
Glucose: 123 mg/dL — ABNORMAL HIGH (ref 65–99)
Potassium: 4.5 mmol/L (ref 3.5–5.2)
Sodium: 140 mmol/L (ref 134–144)
Total Protein: 6.4 g/dL (ref 6.0–8.5)

## 2019-06-13 LAB — CBC WITH DIFFERENTIAL/PLATELET
Basophils Absolute: 0 10*3/uL (ref 0.0–0.2)
Basos: 1 %
EOS (ABSOLUTE): 0.1 10*3/uL (ref 0.0–0.4)
Eos: 2 %
Hematocrit: 40 % (ref 34.0–46.6)
Hemoglobin: 13.2 g/dL (ref 11.1–15.9)
Immature Grans (Abs): 0 10*3/uL (ref 0.0–0.1)
Immature Granulocytes: 0 %
Lymphocytes Absolute: 1.2 10*3/uL (ref 0.7–3.1)
Lymphs: 25 %
MCH: 28.9 pg (ref 26.6–33.0)
MCHC: 33 g/dL (ref 31.5–35.7)
MCV: 88 fL (ref 79–97)
Monocytes Absolute: 0.8 10*3/uL (ref 0.1–0.9)
Monocytes: 17 %
Neutrophils Absolute: 2.5 10*3/uL (ref 1.4–7.0)
Neutrophils: 55 %
Platelets: 159 10*3/uL (ref 150–450)
RBC: 4.57 x10E6/uL (ref 3.77–5.28)
RDW: 13.7 % (ref 11.7–15.4)
WBC: 4.6 10*3/uL (ref 3.4–10.8)

## 2019-06-13 LAB — LIPID PANEL
Chol/HDL Ratio: 2.2 ratio (ref 0.0–4.4)
Cholesterol, Total: 129 mg/dL (ref 100–199)
HDL: 59 mg/dL (ref >39)
LDL Calculated: 49 mg/dL (ref 0–99)
Triglycerides: 107 mg/dL (ref 0–149)
VLDL Cholesterol Cal: 21 mg/dL (ref 5–40)

## 2019-06-13 LAB — HEMOGLOBIN A1C
Est. average glucose Bld gHb Est-mCnc: 137 mg/dL
Hgb A1c MFr Bld: 6.4 % — ABNORMAL HIGH (ref 4.8–5.6)

## 2019-06-13 LAB — TSH: TSH: 2.01 u[IU]/mL (ref 0.450–4.500)

## 2019-06-13 LAB — VITAMIN D 25 HYDROXY (VIT D DEFICIENCY, FRACTURES): Vit D, 25-Hydroxy: 51.2 ng/mL (ref 30.0–100.0)

## 2019-06-13 LAB — T4, FREE: Free T4: 1.04 ng/dL (ref 0.82–1.77)

## 2019-06-29 ENCOUNTER — Encounter: Payer: Self-pay | Admitting: Internal Medicine

## 2019-06-29 ENCOUNTER — Other Ambulatory Visit: Payer: Self-pay

## 2019-06-29 ENCOUNTER — Ambulatory Visit: Payer: PPO | Admitting: Internal Medicine

## 2019-06-29 VITALS — BP 113/59 | HR 56 | Temp 97.0°F | Ht 65.0 in | Wt 160.4 lb

## 2019-06-29 DIAGNOSIS — I480 Paroxysmal atrial fibrillation: Secondary | ICD-10-CM

## 2019-06-29 DIAGNOSIS — F419 Anxiety disorder, unspecified: Secondary | ICD-10-CM | POA: Diagnosis not present

## 2019-06-29 DIAGNOSIS — E785 Hyperlipidemia, unspecified: Secondary | ICD-10-CM

## 2019-06-29 NOTE — Patient Instructions (Signed)
Medication Instructions:  No changes If you need a refill on your cardiac medications before your next appointment, please call your pharmacy.   Follow-Up: At [facility], you and your health needs are our priority.  As part of our continuing mission to provide you with exceptional heart care, we have created designated Provider Care Teams.  These Care Teams include your primary Cardiologist (physician) and Advanced Practice Providers (APPs -  Physician Assistants and Nurse Practitioners) who all work together to provide you with the care you need, when you need it. You will need a follow up appointment in 12 months.  Please call our office 2 months in advance to schedule this appointment.  You may see Dr. Debara Pickett or one of the following Advanced Practice Providers on your designated Care Team: Almyra Deforest, [place] . Fabian Sharp, PA-C  Any Other Special Instructions Will Be Listed Below (If Applicable).

## 2019-06-29 NOTE — Progress Notes (Signed)
OFFICE NOTE  Chief Complaint:  No complaints  Primary Care Physician: Mellody Dance, DO  ID:  Daisy, Nelson 21-Dec-1945, MRN [id number]  PCP:  Mellody Dance, DO  Primary Cardiologist:  Amanada Philbrick     History of Present Illness: Daisy Nelson is a 73 y.o. female no prior history of CAD or AF, presented to the ER 3/10 around 3am with complaints of jaw pain, palpitations, and SOB. Her symptoms woke her from sleep around 12:30am. In the ER she was noted to be in Rapid AF. Her EKG shows diffuse ST depression. Initial Troponin is negative. Her symptoms improved after Diltiazem IV was started. She denied any prior history of similar symptoms in the past. She did take Mucinex D earlier this week for sinus infection.   The patient was admitted to [facility] and started on diltiazem, heparin, ASA, beta blocker and nitrates. She spontaneously converted to NSR and ruled out for MI. Lexiscan myovew revealed normal LVF, no ischemia and fixed defects involving the apical segments of the inferior septum and inferior lateral walls. 2D echo confirmed normal EF of 55-60%. Mild TR. CHADSVSC= 1. She was started on Xarelto.  Lopressor was continued.   She was last seen by Daisy Fuller, PA-C, as she was about to have cataract surgery.  It went very well for her and she can now see clearly.  She reports only an accessional fluttering in her chest.  I did decrease her lopressor to 12.5 mg bid during the last visit because she was having a HR in the 40's.  She otherwise denies  nausea, vomiting, fever, chest pain, shortness of breath, orthopnea, dizziness, PND, cough, congestion, abdominal pain, hematochezia, melena, lower extremity edema.  Daisy Nelson back in the office today. She is currently without complaints. She is active denies any chest pain or shortness of breath. She very rarely gets palpitations the last only for a few seconds. She denies any recurrent atrial fibrillation and her EKG today sinus  rhythm. She's had no bleeding problems on aspirin. She had laboratory work in October which shows a well controlled lipid profile on pravastatin. Weight is stable  I saw Daisy Nelson back today in the office. Overall she is doing very well. She denies any chest pain or shortness of breath. She's had no recurrent A. Fib episodes. She has not needed to take her flecainide as needed. She continues on Xarelto without any bleeding problems. Blood pressure is well-controlled today.  01/18/2017  Daisy Nelson returns today for follow-up. She reports no significant recurrent atrial fibrillation. EKG today shows sinus bradycardia with nonspecific ST changes. She is tolerating Xarelto without any bleeding problems. She has not needed the flecainide pocket pill strategy, but is due for a refill. QTC is 395 ms.  01/11/2018  Daisy Nelson was seen today in follow-up.  Overall she reports doing well.  She denies any significant recurrent atrial fibrillation.  She occasionally feels palpitations.  She does not do active heart rate monitoring.  We discussed the [employer] application as well as the newest iteration of the apple watch, both of which can monitor heart rhythms.  He is tolerating Xarelto without bleeding problems.  She has not required flecainide.  She may need a new prescription as a drug could be older and she should check back with [place] when she looks at her bottle.  06/29/2019  [name] is seen today in follow-up.  Overall she continues to do well.  She says last summer she had one  episode of what she thought was A. fib and took 2 flecainide at night and went to sleep.  She says she woke up and she felt fine without any recurrence.  She continues to monitor heart rate and has not noted any recurrent A. fib.  She denies any bleeding complications on Xarelto.  Overall energy level is good.  She is followed by her PCP closely and recently did a virtual visit.  Blood pressures well controlled today.   Wt  Readings from Last 3 Encounters:  06/29/19 160 lb 6.4 oz (72.8 kg)  06/12/19 154 lb (69.9 kg)  10/24/18 154 lb (69.9 kg)     Past Medical History:  Diagnosis Date  . AF (atrial fibrillation) ([place])    AF with RVR 01/2013  . Allergy   . Anxiety   . Atrial fibrillation with RVR ([place]) 08/31/2015  . Colon polyps   . Complication of anesthesia 2011   colonscopy- "slow to awaken"  . Depression   . DJD (degenerative joint disease) of cervical spine   . Family history of CABG   . GERD (gastroesophageal reflux disease)   . History of kidney stones   . Hyperglycemia   . Hyperlipidemia   . Jaw pain 08/31/2015  . Pneumonia   . PONV (postoperative nausea and vomiting)     Current Outpatient Medications  Medication Sig Dispense Refill  . atorvastatin (LIPITOR) 40 MG tablet Take 1 tablet (40 mg total) by mouth at bedtime. 90 tablet 1  . blood glucose meter kit and supplies KIT Dispense based on patient and insurance preference. Use twice daily for fasting blood sugar and 2 hours after largest meal 1 each 1  . fexofenadine (ALLEGRA) 180 MG tablet Take 180 mg by mouth daily.    . flecainide (TAMBOCOR) 100 MG tablet Take 2 tablets by mouth with onset of atrial fib. No more than once a week 6 tablet 5  . fluticasone (FLONASE) 50 MCG/ACT nasal spray Place 2 sprays into both nostrils daily. 48 g 3  . metoprolol tartrate (LOPRESSOR) 25 MG tablet Take 0.5 tablets (12.5 mg total) by mouth 2 (two) times daily. 90 tablet 0  . sertraline (ZOLOFT) 100 MG tablet Take 1 tablet (100 mg total) by mouth daily. 90 tablet 1  . Vitamin D, Ergocalciferol, (DRISDOL) 1.25 MG (50000 UT) CAPS capsule Take one tablet wkly 12 capsule 3  . XARELTO 20 MG TABS tablet TAKE 1 TABLET BY MOUTH ONCE DAILY WITH SUPPER 90 tablet 1   No current facility-administered medications for this visit.     Allergies:    Allergies  Allergen Reactions  . Adhesive [Tape]     Burn skin  . Erythromycin     Gi intolerance - nausea,  vomiting, and abd pains  . Naproxen Nausea And Vomiting    Abdominal pain   . Penicillins Hives    Has patient had a PCN reaction causing immediate rash, facial/tongue/throat swelling, SOB or lightheadedness with hypotension: UNKNOWN Has patient had a PCN reaction causing severe rash involving mucus membranes or skin necrosis: No Has patient had a PCN reaction that required hospitalization No Has patient had a PCN reaction occurring within the last 10 years: No If all of the above answers are "NO", then may proceed with Cephalosporin use.     Social History:  The patient  reports that she quit smoking about 14 years ago. Her smoking use included cigarettes. She started smoking about 56 years ago. She has a 18.00 pack-year smoking  history. She has never used smokeless tobacco. She reports that she does not drink alcohol or use drugs.   Family history:   Family History  Problem Relation Age of Onset  . Stroke Mother   . Diabetes Father   . Heart disease Father        CABG  . Arthritis Father        C6-7  . Skin cancer Father        SCC  . Arthritis Brother        s/p THR    ROS: A comprehensive review of systems was negative.  HOME MEDS: Current Outpatient Medications  Medication Sig Dispense Refill  . atorvastatin (LIPITOR) 40 MG tablet Take 1 tablet (40 mg total) by mouth at bedtime. 90 tablet 1  . blood glucose meter kit and supplies KIT Dispense based on patient and insurance preference. Use twice daily for fasting blood sugar and 2 hours after largest meal 1 each 1  . fexofenadine (ALLEGRA) 180 MG tablet Take 180 mg by mouth daily.    . flecainide (TAMBOCOR) 100 MG tablet Take 2 tablets by mouth with onset of atrial fib. No more than once a week 6 tablet 5  . fluticasone (FLONASE) 50 MCG/ACT nasal spray Place 2 sprays into both nostrils daily. 48 g 3  . metoprolol tartrate (LOPRESSOR) 25 MG tablet Take 0.5 tablets (12.5 mg total) by mouth 2 (two) times daily. 90 tablet 0   . sertraline (ZOLOFT) 100 MG tablet Take 1 tablet (100 mg total) by mouth daily. 90 tablet 1  . Vitamin D, Ergocalciferol, (DRISDOL) 1.25 MG (50000 UT) CAPS capsule Take one tablet wkly 12 capsule 3  . XARELTO 20 MG TABS tablet TAKE 1 TABLET BY MOUTH ONCE DAILY WITH SUPPER 90 tablet 1   No current facility-administered medications for this visit.     LABS/IMAGING: No results found for this or any previous visit (from the past 48 hour(s)). No results found.  VITALS: BP (!) 113/59   Pulse (!) 56   Temp (!) 97 F (36.1 C)   Ht '5\' 5"'  (1.651 m)   Wt 160 lb 6.4 oz (72.8 kg)   SpO2 95%   BMI 26.69 kg/m   EXAM: General appearance: alert and no distress Neck: no carotid bruit and no JVD Lungs: clear to auscultation bilaterally Heart: regular rate and rhythm, S1, S2 normal, no murmur, click, rub or gallop Abdomen: soft, non-tender; bowel sounds normal; no masses,  no organomegaly Extremities: extremities normal, atraumatic, no cyanosis or edema Pulses: 2+ and symmetric Skin: Skin color, texture, turgor normal. No rashes or lesions Neurologic: Grossly normal Psych: Mood, affect normal  EKG: Sinus bradycardia 52, nonspecific ST changes-personally reviewed  ASSESSMENT: 1. Paroxysmal atrial fibrillation - no significant recurrence, low CHADSVASC score of 1 (female >65) on Xarelto 2. Pocket pill flecainide strategy 3. Dyslipidemia- at goal  PLAN: 1.   Daisy Nelson continues to do well with no significant burden of A. fib.  Her pocket pill flecainide strategy seems to be working.  Cholesterols been at goal.  Her blood pressures well controlled.  EKG shows a sinus rhythm today with normal QTC.  No changes were made in her medications.  Plan follow-up with me annually or sooner as necessary.  Pixie Casino, MD, [facility], [place] Director of the [facility]  [facility] Diplomate of the American Board of  Clinical Lipidology Attending Cardiologist  Direct  Dial: [phone]  Fax: [phone]  Website:  [email or web address]   Nadean Corwin Abir Eroh 06/29/2019, 1:27 PM

## 2019-07-11 ENCOUNTER — Encounter: Payer: Self-pay | Admitting: Family Medicine

## 2019-07-12 ENCOUNTER — Other Ambulatory Visit: Payer: Self-pay

## 2019-07-12 ENCOUNTER — Ambulatory Visit (INDEPENDENT_AMBULATORY_CARE_PROVIDER_SITE_OTHER): Payer: PPO | Admitting: Family Medicine

## 2019-07-12 ENCOUNTER — Other Ambulatory Visit: Payer: PPO

## 2019-07-12 ENCOUNTER — Encounter: Payer: Self-pay | Admitting: Family Medicine

## 2019-07-12 VITALS — BP 116/69 | HR 64 | Temp 98.3°F | Ht 65.0 in | Wt 161.9 lb

## 2019-07-12 DIAGNOSIS — E785 Hyperlipidemia, unspecified: Secondary | ICD-10-CM | POA: Diagnosis not present

## 2019-07-12 DIAGNOSIS — J3089 Other allergic rhinitis: Secondary | ICD-10-CM | POA: Diagnosis not present

## 2019-07-12 DIAGNOSIS — F419 Anxiety disorder, unspecified: Secondary | ICD-10-CM

## 2019-07-12 DIAGNOSIS — L309 Dermatitis, unspecified: Secondary | ICD-10-CM

## 2019-07-12 DIAGNOSIS — R7303 Prediabetes: Secondary | ICD-10-CM | POA: Diagnosis not present

## 2019-07-12 DIAGNOSIS — I4891 Unspecified atrial fibrillation: Secondary | ICD-10-CM | POA: Diagnosis not present

## 2019-07-12 DIAGNOSIS — I10 Essential (primary) hypertension: Secondary | ICD-10-CM | POA: Diagnosis not present

## 2019-07-12 MED ORDER — CLOTRIMAZOLE-BETAMETHASONE 1-0.05 % EX CREA: 1.0000 "application " | TOPICAL_CREAM | Freq: Two times a day (BID) | CUTANEOUS | 0 refills | Status: DC

## 2019-07-12 NOTE — Progress Notes (Signed)
Pt here for an acute care (+ some chronic issues addressed at) OV today   Impression and Recommendations:    1. Dermatitis   2. Hyperlipidemia, unspecified hyperlipidemia type   3. Atrial fibrillation with rapid ventricular response ([place])   4. Prediabetes   5. Anxiety   6. Hypertension, unspecified type   7. Environmental and seasonal allergies      Dermatitis - Plan: clotrimazole-betamethasone (LOTRISONE) cream, NuSwab BV and Candida, NAA  Hyperlipidemia, unspecified hyperlipidemia type - Plan: NuSwab BV and Candida, NAA  Atrial fibrillation with rapid ventricular response ([facility]) - Plan: NuSwab BV and Candida, NAA  Prediabetes - Plan: NuSwab BV and Candida, NAA  Anxiety - Plan: NuSwab BV and Candida, NAA  Hypertension, unspecified type - Plan: NuSwab BV and Candida, NAA  Environmental and seasonal allergies - Plan: NuSwab BV and Candida, NAA  Dermatitis - Patient obtained self BV and yeast swab this morning. - Discussed need to rule out bacterial vaginosis and yeast in the vaginal area.  - Discussed need to treat rash area with half-antifungal half-antiinflammatory cream. - Discussed prudent use of Lotrisone cream, only externally.  - If yeast swab comes back positive, patient notes she would rather have a diflucan pill.  - Advised patient to not use baby wipes in her genital area anymore. - Educated patient to use only water and unscented soap to clean the area. - Advised patient to lay at home with area open to air to help the area stay dry.  - Reviewed recent lab work (06/12/2019) in depth with patient today.  All lab work within normal limits unless otherwise noted.  Extensive education provided today.  All questions were answered.  Vitamin D - Stable on current management. - Discussed goal of maintaining Vitamin D in the 40-60 range. - Continue supplementation as prescribed. - Will continue to monitor.  Prediabetes - Stable at this time, A1c at 6.4 last  check. - Will continue to monitor.  Hyperlipidemia - Stable on current management.  See med list. - Reviewed need to engage in regular physical activity to help improve HDL levels. - Will continue to monitor.  Hypertension - Stable on current management.  See med list. - Treatment per Dr. Debara Pickett of Cardiology. - Ambulatory blood pressure monitoring encouraged. - Will continue to monitor.  Seasonal Allergies - Stable on current management.  See med list. - Advised the patient to begin using AYR or Neilmed sinus rinses BID followed by flonase BID (one spray to each nostril). Advised that the patient may continue to incorporate allegra or claritin PRN.   Anxiety - Stable on current management. - Continue treatment plan as prescribed.  See med list. - Will continue to monitor.  Lifestyle & Preventative Health Maintenance - Advised patient to continue working toward exercising to improve overall mental, physical, and emotional health.    - Reviewed the "spokes of the wheel" of mood and health management.  Stressed the importance of ongoing prudent habits, including regular exercise, appropriate sleep hygiene, healthful dietary habits, and prayer/meditation to relax.  - Encouraged patient to engage in daily physical activity, especially a formal exercise routine.  Recommended that the patient eventually strive for at least 150 minutes of moderate cardiovascular activity per week according to guidelines established by the [facility].   - Healthy dietary habits encouraged, including low-carb, and high amounts of lean protein in diet.   - Patient should also consume adequate amounts of water.  Recommendations - Advised patient to return for  chronic health maintenance and Medicare Wellness as scheduled. - Re-check labs as recommended.   Meds ordered this encounter  Medications  . clotrimazole-betamethasone (LOTRISONE) cream    Sig: Apply 1 application topically 2 (two) times daily.     Dispense:  60 g    Refill:  0     Education and routine counseling performed. Handouts provided  Gross side effects, risk and benefits, and alternatives of medications and treatment plan in general discussed with patient.  Patient is aware that all medications have potential side effects and we are unable to predict every side effect or drug-drug interaction that may occur.   Patient will call with any questions prior to using medication if they have concerns.    Expresses verbal understanding and consents to current therapy and treatment regimen.  No barriers to understanding were identified.  Red flag symptoms and signs discussed in detail.  Patient expressed understanding regarding what to do in case of emergency\urgent symptoms   Please see AVS handed out to patient at the end of our visit for further patient instructions/ counseling done pertaining to today's office visit.   Return for follow-up in 3 months or so for Medicare wellness examination over the phone..     Note:  This document was prepared occasionally using Dragon voice recognition software and may include unintentional dictation errors in addition to a scribe.  This document serves as a record of services personally performed by Mellody Dance, DO. It was created on her behalf by Toni Amend, a trained medical scribe. The creation of this record is based on the scribe's personal observations and the provider's statements to them.   I have reviewed the above medical documentation for accuracy and completeness and I concur.  Mellody Dance, DO 07/12/2019 9:56 PM   --------------------------------------------------------------------------------------------------------------------------------------------------------------------------------------------------------------------------------------------    Subjective:    CC:  Chief Complaint  Patient presents with  . Rash    HPI: Daisy Nelson is a 73 y.o.  female who presents to [place] at [facility] today for issues as discussed below.  Says she's been good until her acute concern.    Chronic Health Stable Overall notes mood and everything else has been good.  Continues Lipitor nightly.  Denies any health concerns as labs are reviewed during appointment.  Confirms she continues on all management as prescribed and has not made any changes.  Acute Skin Rash in Genital Area States her rash started "not so badly" last Wednesday and started getting worse 2-3 days ago on Sunday and Monday.  Patient decided to call in because she didn't know what was going on.  The patient isn't sure when the current rash developed, but "I think maybe Sunday." The rash hasn't changed since when it first started, and says "all I can see is red stuff."  Says "I'm tired of itching and burning."  Notes it feels a "hundred times worse at night."  She is experiencing itching in the vaginal area, "in the privates."  She has never had these symptoms before.  Did a self BV and yeast swab this morning.    Denies vaginal discharge or urinary symptoms.  Notes she had contact dermatitis before, but never in her vagina.  Says "this feels kind of like it."  Denies using new soaps, lotions, sexual lubricants, etc., states she has no new underwear and hasn't used any new products.  Says she used to have yeast infections about 30-40 years ago but this does not feel the  same.  Says she has a bit of an issue with bladder leakage and often wears pads.  Notes sometimes when she sneezes, she has "full-blown episodes" of urination.  Has been using baby wipes to wipe "ever since the corona thing."  She's been using the same brand for sensitive skin the entire time.    No problems updated.   Wt Readings from Last 3 Encounters:  07/12/19 161 lb 14.4 oz (73.4 kg)  06/29/19 160 lb 6.4 oz (72.8 kg)  06/12/19 154 lb (69.9 kg)   BP Readings from Last 3 Encounters:   07/12/19 116/69  06/29/19 (!) 113/59  10/24/18 137/79   BMI Readings from Last 3 Encounters:  07/12/19 26.94 kg/m  06/29/19 26.69 kg/m  06/12/19 25.63 kg/m     Patient Care Team    Relationship Specialty Notifications Start End  Mellody Dance, DO PCP - General Family Medicine  05/10/18   Pixie Casino, MD Consulting Physician Cardiology  11/14/13   Vevelyn Royals, MD Consulting Physician Ophthalmology  07/18/16   [name], MD Consulting Physician Ophthalmology  07/18/16   Carol Ada, MD Consulting Physician Gastroenterology  09/22/16      Patient Active Problem List   Diagnosis Date Noted  . Prediabetes 03/26/2015    Priority: High  . Atrial fibrillation with rapid ventricular response ([place]) 02/06/2013    Priority: High  . Hyperlipidemia 10/25/2012    Priority: High  . hx tob abuse;  30 yrs at roughly 1/2 ppd 05/11/2018    Priority: Medium  . Drug-induced bradycardia-  rate controlled on metoprolol by cards, patient ASx 05/10/2018    Priority: Medium  . GERD (gastroesophageal reflux disease) 10/25/2012    Priority: Medium  . Anxiety 10/25/2012    Priority: Medium  . Family history of coronary artery bypass surgery 02/06/2013    Priority: Low  . DJD (degenerative joint disease) of cervical spine 10/25/2012    Priority: Low  . Colon polyps 10/25/2012    Priority: Low  . Hypertension 06/12/2019  . Environmental and seasonal allergies 10/24/2018  . Depression 05/10/2018  . Preretinal fibrosis, right eye 08/25/2016  . PAF (paroxysmal atrial fibrillation) ([place]) 07/10/2013  . Allergic rhinitis 10/25/2012  . Nephrolithiasis 10/25/2012  . Complication of anesthesia 11/30/2009      Past Medical History:  Diagnosis Date  . AF (atrial fibrillation) ([place])    AF with RVR 01/2013  . Allergy   . Anxiety   . Atrial fibrillation with RVR ([place]) 08/31/2015  . Colon polyps   . Complication of anesthesia 2011   colonscopy- "slow to awaken"  . Depression    . DJD (degenerative joint disease) of cervical spine   . Family history of CABG   . GERD (gastroesophageal reflux disease)   . History of kidney stones   . Hyperglycemia   . Hyperlipidemia   . Jaw pain 08/31/2015  . Pneumonia   . PONV (postoperative nausea and vomiting)      Past Surgical History:  Procedure Laterality Date  . [place] VITRECTOMY WITH 20 GAUGE MVR PORT FOR MACULAR HOLE Right 08/25/2016   Procedure: 25 GAUGE PARS PLANA VITRECTOMY; RIGHT EYE;  Surgeon: [name], MD;  Location: [place];  Service: Ophthalmology;  Laterality: Right;  . ABDOMINAL HYSTERECTOMY  1991  . CATARACT EXTRACTION W/ INTRAOCULAR LENS  IMPLANT, BILATERAL Bilateral 03/2013  . CESAREAN SECTION  1976  . COLONOSCOPY  2011   polyps  . EYE SURGERY    .  GANGLION CYST EXCISION Left    hand  . GAS/FLUID EXCHANGE Right 08/25/2016   Procedure: GAS/FLUID EXCHANGE RIGHT EYE;  Surgeon: [name], MD;  Location: [place];  Service: Ophthalmology;  Laterality: Right;  . IRIDECTOMY Right 08/25/2016   Procedure: PERIPHERAL IRIDECTOMY RIGHT EYE;  Surgeon: [name], MD;  Location: [place];  Service: Ophthalmology;  Laterality: Right;  . LAPAROSCOPY  1976  . MEMBRANE PEEL Right 08/25/2016   Procedure: MEMBRANE PEEL RIGHT EYE;  Surgeon: [name], MD;  Location: [place];  Service: Ophthalmology;  Laterality: Right;  . NM MYOCAR PERF WALL MOTION  01/2013   lexiscan - no pharmacologically induced ischemia, fixed defects in apical segments of anterior septum & inferior lateral wall   . PARS PLANA VITRECTOMY Right 08/25/2016   laser, gas injection, AC wash out. Membrane peel right eye  . TRANSTHORACIC ECHOCARDIOGRAM  01/2013   EF 55-60%, mod conc LVH; mild MR with mildly thickened leaflets; mild TR; PA peak pressure 41mHg  . TUBAL LIGATION  1977     Family History  Problem Relation Age of Onset  . Stroke Mother   . Diabetes Father   . Heart disease Father        CABG  . Arthritis Father         C6-7  . Skin cancer Father        SCC  . Arthritis Brother        s/p THR     Social History   Socioeconomic History  . Marital status: Married    Spouse name: RHerbie Baltimore . Number of children: 1  . Years of education: 189 . Highest education level: Not on file  Occupational History  . Occupation: iMedical illustrator Social Needs  . Financial resource strain: Not on file  . Food insecurity    Worry: Not on file    Inability: Not on file  . Transportation needs    Medical: Not on file    Non-medical: Not on file  Tobacco Use  . Smoking status: Former Smoker    Packs/day: 0.50    Years: 36.00    Pack years: 18.00    Types: Cigarettes    Start date: 03/15/1963    Quit date: 01/08/2005    Years since quitting: 14.5  . Smokeless tobacco: Never Used  Substance and Sexual Activity  . Alcohol use: No  . Drug use: No  . Sexual activity: Yes    Partners: Male    Birth control/protection: Surgical  Lifestyle  . Physical activity    Days per week: Not on file    Minutes per session: Not on file  . Stress: Not on file  Relationships  . Social [place] phone: Not on file    Gets together: Not on file    Attends religious service: Not on file    Active member of club or organization: Not on file    Attends meetings of clubs or organizations: Not on file    Relationship status: Not on file  . Intimate partner violence    Fear of current or ex partner: Not on file    Emotionally abused: Not on file    Physically abused: Not on file    Forced sexual activity: Not on file  Other Topics Concern  . Not on file  Social History Narrative   Lives with her husband and their cat, EElvis Coil     Current  Meds  Medication Sig  . atorvastatin (LIPITOR) 40 MG tablet Take 1 tablet (40 mg total) by mouth at bedtime.  . blood glucose meter kit and supplies KIT Dispense based on patient and insurance preference. Use twice daily for fasting blood sugar and 2 hours after largest  meal  . fexofenadine (ALLEGRA) 180 MG tablet Take 180 mg by mouth daily.  . flecainide (TAMBOCOR) 100 MG tablet Take 2 tablets by mouth with onset of atrial fib. No more than once a week  . fluticasone (FLONASE) 50 MCG/ACT nasal spray Place 2 sprays into both nostrils daily.  . sertraline (ZOLOFT) 100 MG tablet Take 1 tablet (100 mg total) by mouth daily.  . Vitamin D, Ergocalciferol, (DRISDOL) 1.25 MG (50000 UT) CAPS capsule Take one tablet wkly  . XARELTO 20 MG TABS tablet TAKE 1 TABLET BY MOUTH ONCE DAILY WITH SUPPER  . [DISCONTINUED] metoprolol tartrate (LOPRESSOR) 25 MG tablet Take 0.5 tablets (12.5 mg total) by mouth 2 (two) times daily.    Allergies:  Allergies  Allergen Reactions  . Adhesive [Tape]     Burn skin  . Erythromycin     Gi intolerance - nausea, vomiting, and abd pains  . Naproxen Nausea And Vomiting    Abdominal pain   . Penicillins Hives    Has patient had a PCN reaction causing immediate rash, facial/tongue/throat swelling, SOB or lightheadedness with hypotension: UNKNOWN Has patient had a PCN reaction causing severe rash involving mucus membranes or skin necrosis: No Has patient had a PCN reaction that required hospitalization No Has patient had a PCN reaction occurring within the last 10 years: No If all of the above answers are "NO", then may proceed with Cephalosporin use.      Review of Systems: General:   Denies fever, chills, unexplained weight loss.  Optho/Auditory:   Denies visual changes, blurred vision/LOV Respiratory:   Denies wheeze, DOE more than baseline levels.   Cardiovascular:   Denies chest pain, palpitations, new onset peripheral edema  Gastrointestinal:   Denies nausea, vomiting, diarrhea, abd pain.  Genitourinary: Denies dysuria, freq/ urgency, flank pain or discharge from genitals.  Endocrine:     Denies hot or cold intolerance, polyuria, polydipsia. Musculoskeletal:   Denies unexplained myalgias, joint swelling, unexplained  arthralgias, gait problems.  Skin:  Denies new onset rash, suspicious lesions Neurological:     Denies dizziness, unexplained weakness, numbness  Psychiatric/Behavioral:   Denies mood changes, suicidal or homicidal ideations, hallucinations    Objective:   Blood pressure 116/69, pulse 64, temperature 98.3 F (36.8 C), height '5\' 5"'  (1.651 m), weight 161 lb 14.4 oz (73.4 kg), SpO2 98 %. Body mass index is 26.94 kg/m. General:  Well Developed, well nourished, appropriate for stated age.  Neuro:  Alert and oriented,  extra-ocular muscles intact  HEENT:  Normocephalic, atraumatic, neck supple Skin:  no gross rash, warm, pink. Cardiac:  RRR, S1 S2 Respiratory:  ECTA B/L and A/P, Not using accessory muscles, speaking in full sentences- unlabored. Vascular:  Ext warm, no cyanosis apprec.; cap RF less 2 sec. Psych:  No HI/SI, judgement and insight good, Euthymic mood. Full Affect. Genitalia: Red fine maculopapular rash in both of the patient's inguinal creases.  Ext genitalia: without lesion or discharge, no tenderness;  Cervix: WNL's w/o discharge or lesion; Adnexa:  No tenderness or palpable masses  Rectal: without rash or lesion.

## 2019-07-12 NOTE — Patient Instructions (Signed)
Contact Dermatitis Dermatitis is redness, soreness, and swelling (inflammation) of the skin. Contact dermatitis is a reaction to certain substances that touch the skin. Many different substances can cause contact dermatitis. There are two types of contact dermatitis:  Irritant contact dermatitis. This type is caused by something that irritates your skin, such as having dry hands from washing them too often with soap. This type does not require previous exposure to the substance for a reaction to occur. This is the most common type.  Allergic contact dermatitis. This type is caused by a substance that you are allergic to, such as poison ivy. This type occurs when you have been exposed to the substance (allergen) and develop a sensitivity to it. Dermatitis may develop soon after your first exposure to the allergen, or it may not develop until the next time you are exposed and every time thereafter. What are the causes? Irritant contact dermatitis is most commonly caused by exposure to:  Makeup.  Soaps.  Detergents.  Bleaches.  Acids.  Metal salts, such as nickel. Allergic contact dermatitis is most commonly caused by exposure to:  Poisonous plants.  Chemicals.  Jewelry.  Latex.  Medicines.  Preservatives in products, such as clothing. What increases the risk? You are more likely to develop this condition if you have:  A job that exposes you to irritants or allergens.  Certain medical conditions, such as asthma or eczema. What are the signs or symptoms? Symptoms of this condition may occur on your body anywhere the irritant has touched you or is touched by you.  Symptoms include: ? Dryness or flaking. ? Redness. ? Cracks. ? Itching. ? Pain or a burning feeling. ? Blisters. ? Drainage of small amounts of blood or clear fluid from skin cracks. With allergic contact dermatitis, there may also be swelling in areas such as the eyelids, mouth, or genitals. How is this  diagnosed? This condition is diagnosed with a medical history and physical exam.  A patch skin test may be performed to help determine the cause.  If the condition is related to your job, you may need to see an occupational medicine specialist. How is this treated? This condition is treated by checking for the cause of the reaction and protecting your skin from further contact. Treatment may also include:  Steroid creams or ointments. Oral steroid medicines may be needed in more severe cases.  Antibiotic medicines or antibacterial ointments, if a skin infection is present.  Antihistamine lotion or an antihistamine taken by mouth to ease itching.  A bandage (dressing). Follow these instructions at home: Skin care  Moisturize your skin as needed.  Apply cool compresses to the affected areas.  Try applying baking soda paste to your skin. Stir water into baking soda until it reaches a paste-like consistency.  Do not scratch your skin, and avoid friction to the affected area.  Avoid the use of soaps, perfumes, and dyes. Medicines  Take or apply over-the-counter and prescription medicines only as told by your health care provider.  If you were prescribed an antibiotic medicine, take or apply the antibiotic as told by your health care provider. Do not stop using the antibiotic even if your condition improves. Bathing  Try taking a bath with: ? Epsom salts. Follow the instructions on the packaging. You can get these at your local pharmacy or grocery store. ? Baking soda. Pour a small amount into the bath as directed by your health care provider. ? Colloidal oatmeal. Follow the instructions on the   packaging. You can get this at your local pharmacy or grocery store.  Bathe less frequently, such as every other day.  Bathe in lukewarm water. Avoid using hot water. Bandage care  If you were given a bandage (dressing), change it as told by your health care provider.  Wash your hands  with soap and water before and after you change your dressing. If soap and water are not available, use hand sanitizer. General instructions  Avoid the substance that caused your reaction. If you do not know what caused it, keep a journal to try to track what caused it. Write down: ? What you eat. ? What cosmetic products you use. ? What you drink. ? What you wear in the affected area. This includes jewelry.  More redness, swelling, or pain.  More fluid or blood.  Warmth.  Pus or a bad smell.  Keep all follow-up visits as told by your health care provider. This is important. Contact a health care provider if:  Your condition does not improve with treatment.  Your condition gets worse.  You have signs of infection such as swelling, tenderness, redness, soreness, or warmth in the affected area.  You have a fever.  You have new symptoms. Get help right away if:  You have a severe headache, neck pain, or neck stiffness.  You vomit.  You feel very sleepy.  You notice red streaks coming from the affected area.  Your bone or joint underneath the affected area becomes painful after the skin has healed.  The affected area turns darker.  You have difficulty breathing. Summary  Dermatitis is redness, soreness, and swelling (inflammation) of the skin. Contact dermatitis is a reaction to certain substances that touch the skin.  Symptoms of this condition may occur on your body anywhere the irritant has touched you or is touched by you.  This condition is treated by figuring out what caused the reaction and protecting your skin from further contact. Treatment may also include medicines and skin care.  Avoid the substance that caused your reaction. If you do not know what caused it, keep a journal to try to track what caused it.  Contact a health care provider if your condition gets worse or you have signs of infection such as swelling, tenderness, redness, soreness, or warmth  in the affected area. This information is not intended to replace advice given to you by your health care provider. Make sure you discuss any questions you have with your health care provider. Document Released: 11/13/2000 Document Revised: 03/08/2019 Document Reviewed: 06/01/2018 Elsevier Patient Education  2020 [employer]  

## 2019-07-14 LAB — NUSWAB BV AND CANDIDA, NAA
Candida albicans, NAA: NEGATIVE
Candida glabrata, NAA: NEGATIVE

## 2019-07-18 ENCOUNTER — Other Ambulatory Visit: Payer: Self-pay | Admitting: Internal Medicine

## 2019-07-18 ENCOUNTER — Ambulatory Visit
Admission: RE | Admit: 2019-07-18 | Discharge: 2019-07-18 | Disposition: A | Discharge status: Home / Self Care | Payer: PPO | Source: Ambulatory Visit | Attending: Family Medicine | Admitting: Family Medicine

## 2019-07-18 ENCOUNTER — Other Ambulatory Visit: Payer: Self-pay

## 2019-07-18 DIAGNOSIS — Z1231 Encounter for screening mammogram for malignant neoplasm of breast: Secondary | ICD-10-CM

## 2019-08-08 DIAGNOSIS — M5414 Radiculopathy, thoracic region: Secondary | ICD-10-CM | POA: Diagnosis not present

## 2019-08-08 DIAGNOSIS — M5137 Other intervertebral disc degeneration, lumbosacral region: Secondary | ICD-10-CM | POA: Diagnosis not present

## 2019-08-08 DIAGNOSIS — M9903 Segmental and somatic dysfunction of lumbar region: Secondary | ICD-10-CM | POA: Diagnosis not present

## 2019-08-08 DIAGNOSIS — M5386 Other specified dorsopathies, lumbar region: Secondary | ICD-10-CM | POA: Diagnosis not present

## 2019-08-08 DIAGNOSIS — M9905 Segmental and somatic dysfunction of pelvic region: Secondary | ICD-10-CM | POA: Diagnosis not present

## 2019-08-08 DIAGNOSIS — M9902 Segmental and somatic dysfunction of thoracic region: Secondary | ICD-10-CM | POA: Diagnosis not present

## 2019-08-10 DIAGNOSIS — M9903 Segmental and somatic dysfunction of lumbar region: Secondary | ICD-10-CM | POA: Diagnosis not present

## 2019-08-10 DIAGNOSIS — M5137 Other intervertebral disc degeneration, lumbosacral region: Secondary | ICD-10-CM | POA: Diagnosis not present

## 2019-08-10 DIAGNOSIS — M5386 Other specified dorsopathies, lumbar region: Secondary | ICD-10-CM | POA: Diagnosis not present

## 2019-08-10 DIAGNOSIS — M9902 Segmental and somatic dysfunction of thoracic region: Secondary | ICD-10-CM | POA: Diagnosis not present

## 2019-08-10 DIAGNOSIS — M9905 Segmental and somatic dysfunction of pelvic region: Secondary | ICD-10-CM | POA: Diagnosis not present

## 2019-08-10 DIAGNOSIS — M5414 Radiculopathy, thoracic region: Secondary | ICD-10-CM | POA: Diagnosis not present

## 2019-08-15 DIAGNOSIS — M9905 Segmental and somatic dysfunction of pelvic region: Secondary | ICD-10-CM | POA: Diagnosis not present

## 2019-08-15 DIAGNOSIS — M9903 Segmental and somatic dysfunction of lumbar region: Secondary | ICD-10-CM | POA: Diagnosis not present

## 2019-08-15 DIAGNOSIS — M5386 Other specified dorsopathies, lumbar region: Secondary | ICD-10-CM | POA: Diagnosis not present

## 2019-08-15 DIAGNOSIS — M5137 Other intervertebral disc degeneration, lumbosacral region: Secondary | ICD-10-CM | POA: Diagnosis not present

## 2019-08-15 DIAGNOSIS — M5414 Radiculopathy, thoracic region: Secondary | ICD-10-CM | POA: Diagnosis not present

## 2019-08-15 DIAGNOSIS — M9902 Segmental and somatic dysfunction of thoracic region: Secondary | ICD-10-CM | POA: Diagnosis not present

## 2019-08-17 DIAGNOSIS — M9905 Segmental and somatic dysfunction of pelvic region: Secondary | ICD-10-CM | POA: Diagnosis not present

## 2019-08-17 DIAGNOSIS — M5414 Radiculopathy, thoracic region: Secondary | ICD-10-CM | POA: Diagnosis not present

## 2019-08-17 DIAGNOSIS — M9903 Segmental and somatic dysfunction of lumbar region: Secondary | ICD-10-CM | POA: Diagnosis not present

## 2019-08-17 DIAGNOSIS — M5386 Other specified dorsopathies, lumbar region: Secondary | ICD-10-CM | POA: Diagnosis not present

## 2019-08-17 DIAGNOSIS — M9902 Segmental and somatic dysfunction of thoracic region: Secondary | ICD-10-CM | POA: Diagnosis not present

## 2019-08-17 DIAGNOSIS — M5137 Other intervertebral disc degeneration, lumbosacral region: Secondary | ICD-10-CM | POA: Diagnosis not present

## 2019-08-20 ENCOUNTER — Encounter: Payer: Self-pay | Admitting: Family Medicine

## 2019-08-20 DIAGNOSIS — M549 Dorsalgia, unspecified: Secondary | ICD-10-CM

## 2019-08-21 DIAGNOSIS — M9903 Segmental and somatic dysfunction of lumbar region: Secondary | ICD-10-CM | POA: Diagnosis not present

## 2019-08-21 DIAGNOSIS — M5414 Radiculopathy, thoracic region: Secondary | ICD-10-CM | POA: Diagnosis not present

## 2019-08-21 DIAGNOSIS — M9902 Segmental and somatic dysfunction of thoracic region: Secondary | ICD-10-CM | POA: Diagnosis not present

## 2019-08-21 DIAGNOSIS — M9905 Segmental and somatic dysfunction of pelvic region: Secondary | ICD-10-CM | POA: Diagnosis not present

## 2019-08-21 DIAGNOSIS — M5386 Other specified dorsopathies, lumbar region: Secondary | ICD-10-CM | POA: Diagnosis not present

## 2019-08-21 DIAGNOSIS — M5137 Other intervertebral disc degeneration, lumbosacral region: Secondary | ICD-10-CM | POA: Diagnosis not present

## 2019-08-31 DIAGNOSIS — M9905 Segmental and somatic dysfunction of pelvic region: Secondary | ICD-10-CM | POA: Diagnosis not present

## 2019-08-31 DIAGNOSIS — M5137 Other intervertebral disc degeneration, lumbosacral region: Secondary | ICD-10-CM | POA: Diagnosis not present

## 2019-08-31 DIAGNOSIS — M9903 Segmental and somatic dysfunction of lumbar region: Secondary | ICD-10-CM | POA: Diagnosis not present

## 2019-08-31 DIAGNOSIS — M5414 Radiculopathy, thoracic region: Secondary | ICD-10-CM | POA: Diagnosis not present

## 2019-08-31 DIAGNOSIS — M5386 Other specified dorsopathies, lumbar region: Secondary | ICD-10-CM | POA: Diagnosis not present

## 2019-08-31 DIAGNOSIS — M9902 Segmental and somatic dysfunction of thoracic region: Secondary | ICD-10-CM | POA: Diagnosis not present

## 2019-09-12 ENCOUNTER — Other Ambulatory Visit: Payer: Self-pay

## 2019-09-12 ENCOUNTER — Ambulatory Visit (INDEPENDENT_AMBULATORY_CARE_PROVIDER_SITE_OTHER): Payer: PPO

## 2019-09-12 DIAGNOSIS — Z23 Encounter for immunization: Secondary | ICD-10-CM

## 2019-09-12 NOTE — Progress Notes (Signed)
Pt here for influenza vaccine.  Screening questionnaire reviewed, VIS provided to patient, and any/all patient questions answered.  T. Nelson, CMA  

## 2019-10-02 ENCOUNTER — Other Ambulatory Visit: Payer: Self-pay

## 2019-10-02 ENCOUNTER — Encounter: Payer: Self-pay | Admitting: Family Medicine

## 2019-10-02 ENCOUNTER — Ambulatory Visit (INDEPENDENT_AMBULATORY_CARE_PROVIDER_SITE_OTHER): Payer: PPO | Admitting: Family Medicine

## 2019-10-02 VITALS — BP 136/58 | HR 51 | Temp 97.6°F | Ht 65.0 in | Wt 159.6 lb

## 2019-10-02 DIAGNOSIS — I480 Paroxysmal atrial fibrillation: Secondary | ICD-10-CM | POA: Diagnosis not present

## 2019-10-02 DIAGNOSIS — E2839 Other primary ovarian failure: Secondary | ICD-10-CM | POA: Diagnosis not present

## 2019-10-02 DIAGNOSIS — Z114 Encounter for screening for human immunodeficiency virus [HIV]: Secondary | ICD-10-CM

## 2019-10-02 DIAGNOSIS — Z1211 Encounter for screening for malignant neoplasm of colon: Secondary | ICD-10-CM

## 2019-10-02 DIAGNOSIS — R7303 Prediabetes: Secondary | ICD-10-CM

## 2019-10-02 DIAGNOSIS — Z23 Encounter for immunization: Secondary | ICD-10-CM

## 2019-10-02 DIAGNOSIS — Z780 Asymptomatic menopausal state: Secondary | ICD-10-CM

## 2019-10-02 DIAGNOSIS — E559 Vitamin D deficiency, unspecified: Secondary | ICD-10-CM | POA: Diagnosis not present

## 2019-10-02 DIAGNOSIS — R001 Bradycardia, unspecified: Secondary | ICD-10-CM

## 2019-10-02 DIAGNOSIS — I10 Essential (primary) hypertension: Secondary | ICD-10-CM

## 2019-10-02 DIAGNOSIS — I4891 Unspecified atrial fibrillation: Secondary | ICD-10-CM

## 2019-10-02 DIAGNOSIS — Z0000 Encounter for general adult medical examination without abnormal findings: Secondary | ICD-10-CM

## 2019-10-02 DIAGNOSIS — E785 Hyperlipidemia, unspecified: Secondary | ICD-10-CM | POA: Diagnosis not present

## 2019-10-02 DIAGNOSIS — Z0001 Encounter for general adult medical examination with abnormal findings: Secondary | ICD-10-CM

## 2019-10-02 DIAGNOSIS — T50905A Adverse effect of unspecified drugs, medicaments and biological substances, initial encounter: Secondary | ICD-10-CM

## 2019-10-02 DIAGNOSIS — K219 Gastro-esophageal reflux disease without esophagitis: Secondary | ICD-10-CM | POA: Diagnosis not present

## 2019-10-02 DIAGNOSIS — K635 Polyp of colon: Secondary | ICD-10-CM

## 2019-10-02 MED ORDER — SHINGRIX 50 MCG/0.5ML IM SUSR: 0.5000 mL | Freq: Once | INTRAMUSCULAR | 0 refills | Status: AC

## 2019-10-02 NOTE — Progress Notes (Signed)
Subjective:   Daisy Nelson is a 73 y.o. female who presents for Medicare Annual (Subsequent) preventive examination.  HPI: Confirms that she's been wearing masks.   - Occasional Tinnitus Notes does experience some ringing/noises in her ears; "it's not every day, all day, but there are times where I have high-pitched ringing; a real high pitched squeal in my ears." Says whenever the ringing occurs, "you just sort of pause and go, my gosh, and then it goes away after a very short period." "It's not something that's gonna affect anything that I do."   Notes "my father is totally deaf in the right ear, but he's 61 and this came on with his age." Says "I don't know if it has anything to do with me or not." States she's pretty sure that her father's hearing loss is age-related, and he was not diagnosed until 5-7 years ago.    - Colonoscopy Screening Regarding her colonoscopy, started with [facility] because her sister-in-law recommended them.   Notes had a Hep C test done in 2014, [place] on [address].   She has three younger brothers; her first brother, who is age 56, was diagnosed in the last 3 months with diabetes. Notes "he is following in my father's footsteps."     Objective:     Vitals: BP (!) 136/58 (BP Location: Left Arm, Patient Position: Sitting, Cuff Size: Large)   Pulse (!) 51   Temp 97.6 F (36.4 C) (Oral)   Ht _0  (1.651 m)   Wt 159 lb 9.6 oz (72.4 kg)   BMI 26.56 kg/m   Body mass index is 26.56 kg/m.   Advanced Directives 05/10/2018 09/20/2017 08/25/2016 08/31/2015 09/28/2014 02/06/2013  Does Patient Have a Medical Advance Directive? _1  Patient has advance directive, copy not in chart  Type of Advance Directive - - - - - Living will  Would patient like information on creating a medical advance directive? No - Patient declined No - Patient declined No - patient declined information No - patient declined information Yes -  Scientist, clinical (histocompatibility and immunogenetics) given -    Tobacco Social History   Tobacco Use  Smoking Status Former Smoker  . Packs/day: 0.50  . Years: 36.00  . Pack years: 18.00  . Types: Cigarettes  . Start date: 03/15/1963  . Quit date: 01/08/2005  . Years since quitting: 14.7  Smokeless Tobacco Never Used      Past Medical History:  Diagnosis Date  . AF (atrial fibrillation) ([place])    AF with RVR 01/2013  . Allergy   . Anxiety   . Atrial fibrillation with RVR ([place]) 08/31/2015  . Colon polyps   . Complication of anesthesia 2011   colonscopy- "slow to awaken"  . Depression   . DJD (degenerative joint disease) of cervical spine   . Family history of CABG   . GERD (gastroesophageal reflux disease)   . History of kidney stones   . Hyperglycemia   . Hyperlipidemia   . Jaw pain 08/31/2015  . Pneumonia   . PONV (postoperative nausea and vomiting)      Past Surgical History:  Procedure Laterality Date  . [place] VITRECTOMY WITH 20 GAUGE MVR PORT FOR MACULAR HOLE Right 08/25/2016   Procedure: 25 GAUGE PARS PLANA VITRECTOMY; RIGHT EYE;  Surgeon: [name], MD;  Location: [place];  Service: Ophthalmology;  Laterality: Right;  . ABDOMINAL HYSTERECTOMY  1991  . CATARACT EXTRACTION W/ INTRAOCULAR  LENS  IMPLANT, BILATERAL Bilateral 03/2013  . CESAREAN SECTION  1976  . COLONOSCOPY  2011   polyps  . EYE SURGERY    . GANGLION CYST EXCISION Left    hand  . GAS/FLUID EXCHANGE Right 08/25/2016   Procedure: GAS/FLUID EXCHANGE RIGHT EYE;  Surgeon: [name], MD;  Location: [place];  Service: Ophthalmology;  Laterality: Right;  . IRIDECTOMY Right 08/25/2016   Procedure: PERIPHERAL IRIDECTOMY RIGHT EYE;  Surgeon: [name], MD;  Location: [place];  Service: Ophthalmology;  Laterality: Right;  . LAPAROSCOPY  1976  . MEMBRANE PEEL Right 08/25/2016   Procedure: MEMBRANE PEEL RIGHT EYE;  Surgeon: [name], MD;  Location: [place];  Service: Ophthalmology;  Laterality: Right;  . NM MYOCAR  PERF WALL MOTION  01/2013   lexiscan - no pharmacologically induced ischemia, fixed defects in apical segments of anterior septum & inferior lateral wall   . PARS PLANA VITRECTOMY Right 08/25/2016   laser, gas injection, AC wash out. Membrane peel right eye  . TRANSTHORACIC ECHOCARDIOGRAM  01/2013   EF 55-60%, mod conc LVH; mild MR with mildly thickened leaflets; mild TR; PA peak pressure 48mHg  . TUBAL LIGATION  1977     Family History  Problem Relation Age of Onset  . Stroke Mother   . Diabetes Father   . Heart disease Father        CABG  . Arthritis Father        C6-7  . Skin cancer Father        SCC  . Arthritis Brother        s/p THR      Social History   Socioeconomic History  . Marital status: Married    Spouse name: RHerbie Baltimore . Number of children: 1  . Years of education: 179 . Highest education level: Not on file  Occupational History  . Occupation: iMedical illustrator Social Needs  . Financial resource strain: Not on file  . Food insecurity    Worry: Not on file    Inability: Not on file  . Transportation needs    Medical: Not on file    Non-medical: Not on file  Tobacco Use  . Smoking status: Former Smoker    Packs/day: 0.50    Years: 36.00    Pack years: 18.00    Types: Cigarettes    Start date: 03/15/1963    Quit date: 01/08/2005    Years since quitting: 14.7  . Smokeless tobacco: Never Used  Substance and Sexual Activity  . Alcohol use: No  . Drug use: No  . Sexual activity: Yes    Partners: Male    Birth control/protection: Surgical  Lifestyle  . Physical activity    Days per week: Not on file    Minutes per session: Not on file  . Stress: Not on file  Relationships  . Social [place] phone: Not on file    Gets together: Not on file    Attends religious service: Not on file    Active member of club or organization: Not on file    Attends meetings of clubs or organizations: Not on file    Relationship status: Not on file   Other Topics Concern  . Not on file  Social History Narrative   Lives with her husband and their cat, EElvis Coil     Outpatient Encounter Medications as of 10/02/2019  Medication Sig  . atorvastatin (LIPITOR)  40 MG tablet Take 1 tablet (40 mg total) by mouth at bedtime.  . cimetidine (TAGAMET) 200 MG tablet Take 200 mg by mouth daily.  . fexofenadine (ALLEGRA) 180 MG tablet Take 180 mg by mouth daily.  . flecainide (TAMBOCOR) 100 MG tablet Take 2 tablets by mouth with onset of atrial fib. No more than once a week  . fluticasone (FLONASE) 50 MCG/ACT nasal spray Place 2 sprays into both nostrils daily.  . metoprolol tartrate (LOPRESSOR) 25 MG tablet Take 1/2 (one-half) tablet by mouth twice daily  . sertraline (ZOLOFT) 100 MG tablet Take 1 tablet (100 mg total) by mouth daily.  . Vitamin D, Ergocalciferol, (DRISDOL) 1.25 MG (50000 UT) CAPS capsule Take one tablet wkly  . XARELTO 20 MG TABS tablet TAKE 1 TABLET BY MOUTH ONCE DAILY WITH SUPPER  . blood glucose meter kit and supplies KIT Dispense based on patient and insurance preference. Use twice daily for fasting blood sugar and 2 hours after largest meal  . [EXPIRED] Zoster Vaccine Adjuvanted [facility]) injection Inject 0.5 mLs into the muscle once for 1 dose.  . [DISCONTINUED] atorvastatin (LIPITOR) 40 MG tablet Take 1 tablet (40 mg total) by mouth at bedtime. ( no RF until bldwrk done)  . [DISCONTINUED] clotrimazole-betamethasone (LOTRISONE) cream Apply 1 application topically 2 (two) times daily. (Patient not taking: Reported on 10/02/2019)   No facility-administered encounter medications on file as of 10/02/2019.      Activities of Daily Living In your present state of health, do you have any difficulty performing the following activities: 10/02/2019 06/12/2019  Hearing? N N  Vision? N N  Difficulty concentrating or making decisions? N N  Walking or climbing stairs? N N  Dressing or bathing? N N  Doing errands, shopping? N N  Some  recent data might be hidden     Patient Care Team: Mellody Dance, DO as PCP - General (Family Medicine) Debara Pickett Nadean Corwin, MD as Consulting Physician (Cardiology) Vevelyn Royals, MD as Consulting Physician (Ophthalmology) [name], MD as Consulting Physician (Ophthalmology) Carol Ada, MD as Consulting Physician (Gastroenterology)      Assessment:   This is a routine wellness examination for Arora.   Goals    . Reduce sodium intake     Patient is currently decreasing her sodium intake due to her recent A-fib diagnose.        Fall Risk Fall Risk  10/02/2019 06/12/2019 03/29/2018 11/03/2017 11/03/2017  Falls in the past year? 0 0 No No No  Number falls in past yr: 0 - - - -  Injury with Fall? 0 - - - -  Follow up - Falls evaluation completed - - -    Is the patient's home free of loose throw rugs in walkways, pet beds, electrical cords, etc?   yes      Grab bars in the bathroom? no      Handrails on the stairs?   yes      Adequate lighting?   yes   Timed Get Up and Go performed:n/a  Depression Screen PHQ 2/9 Scores 10/02/2019 07/12/2019 06/12/2019 10/24/2018  PHQ - 2 Score 0 0 0 0  PHQ- 9 Score 1 1 0 1     Cognitive Function     6CIT Screen 10/02/2019 09/20/2017  What Year? 0 points 0 points  What month? 0 points 0 points  What time? 0 points 0 points  Count back from 20 0 points 0 points  Months in reverse 0 points 0  points  Repeat phrase 0 points 2 points  Total Score 0 2    Immunization History  Administered Date(s) Administered  . Fluad Quad(high Dose 65+) 09/12/2019  . Influenza Split 08/24/2011  . Influenza, High Dose Seasonal PF 09/12/2018  . Influenza, Seasonal, Injecte, Preservative Fre 11/15/2012  . Influenza,inj,Quad PF,6+ Mos 08/03/2013, 09/28/2014, 09/24/2015, 09/22/2016, 08/30/2017  . Influenza-Unspecified 08/30/2017  . Pneumococcal Conjugate-13 09/28/2014  . Pneumococcal Polysaccharide-23 08/03/2013  . Tdap 08/17/2008  . Zoster  11/27/2013  . Zoster Recombinat (Shingrix) 10/04/2019    Qualifies for Shingles Vaccine? Sent into pharmacy - pt notified two injections needed   Screening Tests Health Maintenance  Topic Date Due  . TETANUS/TDAP  10/25/2019 (Originally 08/17/2018)  . MAMMOGRAM  07/17/2021  . COLONOSCOPY  09/24/2021  . INFLUENZA VACCINE  Completed  . DEXA SCAN  Completed  . Hepatitis C Screening  Completed  . PNA vac Low Risk Adult  Completed    Cancer Screenings: Lung: Low Dose CT Chest recommended if Age 25-80 years, 30 pack-year currently smoking OR have quit w/in 15years. Patient does not qualify.  Breast:  Up to date on Mammogram? Yes    Up to date of Bone Density/Dexa? No  2014, July last one  Colorectal: Ordered  Additional Screenings:  HIV Screening:  Ordered HeP C- done       Plan:      PLAN:  - Last colonoscopy obtained 08/2016 through [facility].  - Per patient, was told to repeat colonoscopy this year; has received her letter to make her appointment.  - Per patient, "I have shied away from making that appointment, but I know I've gotta do it." - Discussed referral to [facility] today. - Referral placed today. See orders. -   Last mammogram obtained 07/18/2019; WNL. - Patient will continue yearly follow-up.   - Last DEXA was June 13, 2013. - Need for updated DEXA screen.   - Patient knows that she may be sent on referral to ENT if her tinnitus exacerbates.  Advised patient to ask her father whether his hearing loss is age-related or genetic.   - Discussed need for shingles vaccination today, Shingrix. See orders. - Education provided today regarding two-shot series.   - Discussed indications for need for low-risk Hep C/HIV screening.   - Patient knows to call front desk regarding referrals if she does not hear back about them over the next week.   - With A1c last measured at 6.4, reviewed critical importance of closely monitoring blood sugars. - Advised  patient to check her A1c in near future. - Advised A1c re-check every six months, with home kit checks every 3 months at patient's discretion.    I have personally reviewed and noted the following in the patient's chart:   . Medical and social history . Use of alcohol, tobacco or illicit drugs  . Current medications and supplements . Functional ability and status . Nutritional status . Physical activity . Advanced directives . List of other physicians . Hospitalizations, surgeries, and ER visits in previous 12 months . Vitals . Screenings to include cognitive, depression, and falls . Referrals and appointments  In addition, I have reviewed and discussed with patient certain preventive protocols, quality metrics, and best practice recommendations. A written personalized care plan for preventive services as well as general preventive health recommendations were provided to patient.    Mellody Dance, DO

## 2019-10-05 ENCOUNTER — Encounter: Payer: Self-pay | Admitting: Family Medicine

## 2019-10-09 ENCOUNTER — Telehealth: Payer: Self-pay | Admitting: Gastroenterology

## 2019-10-10 ENCOUNTER — Other Ambulatory Visit: Payer: Self-pay | Admitting: Family Medicine

## 2019-10-10 DIAGNOSIS — F419 Anxiety disorder, unspecified: Secondary | ICD-10-CM

## 2019-10-10 NOTE — Telephone Encounter (Signed)
The 2011 full colonoscopy report is reviewed, though no pathology report on its polyp available.  The 09/24/16 colonoscopy by Dr. Benson Norway is only the photographs.  Need full procedure and pathology reports to make recommendation on timing of next colonoscopy.  Also on chronic oral anticoagulation for A fib.  I recommend obtaining the additional reports, and then have an office visit with me or an APP to review reports and her medical condition because on anticoagulation.

## 2019-10-12 NOTE — Telephone Encounter (Signed)
Spoke with patient and advised we need her full colon report and both path reports. She said she will contact Dr. Ulyses Amor office and have them sent to [place].  Records that have been reviewed will be placed in the "records for review" folder

## 2019-10-23 ENCOUNTER — Other Ambulatory Visit: Payer: Self-pay | Admitting: Family Medicine

## 2019-10-23 ENCOUNTER — Other Ambulatory Visit: Payer: Self-pay | Admitting: Internal Medicine

## 2019-10-23 DIAGNOSIS — J309 Allergic rhinitis, unspecified: Secondary | ICD-10-CM

## 2019-11-02 ENCOUNTER — Telehealth: Payer: Self-pay | Admitting: Internal Medicine

## 2019-11-02 NOTE — Telephone Encounter (Signed)
°  Patient c/o Palpitations:  High priority if patient c/o lightheadedness, shortness of breath, or chest pain  1) How long have you had palpitations/irregular HR/ Afib? Are you having the symptoms now? Since 8:30 last night   2) Are you currently experiencing lightheadedness, SOB or CP? no  3) Do you have a history of afib (atrial fibrillation) or irregular heart rhythm? yes  4) Have you checked your BP or HR? (document readings if available): 125   5) Are you experiencing any other symptoms? no  Patient states that her HR usually rests in the 50-55 range. It went high yesterday and she took two flecainide like Dr. Debara Pickett recommended. She is concerned about it still being higher than normal

## 2019-11-02 NOTE — Telephone Encounter (Signed)
Pt advised to continue to monitor and to please call if anything changes or worsens and pt agreed.

## 2019-11-02 NOTE — Telephone Encounter (Signed)
Pt called to report that last night she noticed on her BP monitor that her HR was 86 and it seemed irregular.. her BP was 105/70... she did not feel bad but on recheck her HR went up to 95, 113, 125. She took Flecainide 200 mg and her metoprolol 12.5 mg.   This morning her HR was still 125 and she took her Metoprolol 12.5 mg.. her BP is 111/74.   She denies palpitations, no SOB, no dizziness....but says her chest feels tight.   She has been taking her Xarelto without any missed doses.   Pt will rest and continue to monitor... if she develops any worsening sx or problems to call us back ASAP or EMS.   Will forward to Dr. Debara Pickett for review.

## 2019-11-02 NOTE — Telephone Encounter (Signed)
Agree thanks!  Dr. H 

## 2019-11-07 NOTE — Telephone Encounter (Signed)
(  Records received from Dr. Ulyses Amor office.  For documentation purposes:   Office note from July 2011 with lower abdominal pain, CT abdomen and pelvis showing sigmoid diverticulitis  Colonoscopy September 2011 with a diminutive adenomatous and diminutive hyperplastic polyp  Colonoscopy October 2017 noting severe left-sided diverticulosis causing difficulty with scope passage.  Cecum was eventually reached, preparation excellent.  9 polyps were reportedly removed, not entirely clear which polyps were placed in which specimen bottles.  There were 2 polyps reportedly 8 to 10 mm in the cecum removed piecemeal with hot snare.  Pathology jar 1 has multiple fragments of adenomatous polyp, pathology jar 2 hyperplastic polyp)   (Note to clinician: The procedure should be done in the hospital endoscopy lab, as special scope or devices may be necessary due to the above-noted factors)   ______________________________________________________  This patient is due for a surveillance colonoscopy.  She is not previously been seen by this practice, is on oral anticoagulation, and thus needs an office appointment with me or one of the apps first.  - HD

## 2019-11-15 ENCOUNTER — Encounter: Payer: Self-pay | Admitting: Gastroenterology

## 2019-11-15 NOTE — Telephone Encounter (Signed)
Pt Is scheduled to see Dr. Loletha Carrow on 12/21/2019.  Paperwork mailed and patient aware

## 2019-12-05 ENCOUNTER — Encounter: Payer: Self-pay | Admitting: Family Medicine

## 2019-12-12 ENCOUNTER — Other Ambulatory Visit: Payer: Self-pay | Admitting: Family Medicine

## 2019-12-12 ENCOUNTER — Encounter: Payer: Self-pay | Admitting: Family Medicine

## 2019-12-12 DIAGNOSIS — E785 Hyperlipidemia, unspecified: Secondary | ICD-10-CM

## 2019-12-14 ENCOUNTER — Other Ambulatory Visit: Payer: Self-pay

## 2019-12-14 ENCOUNTER — Ambulatory Visit (INDEPENDENT_AMBULATORY_CARE_PROVIDER_SITE_OTHER): Payer: PPO | Admitting: Family Medicine

## 2019-12-14 ENCOUNTER — Encounter: Payer: Self-pay | Admitting: Family Medicine

## 2019-12-14 VITALS — BP 117/58 | HR 51 | Ht 65.0 in | Wt 157.0 lb

## 2019-12-14 DIAGNOSIS — E559 Vitamin D deficiency, unspecified: Secondary | ICD-10-CM | POA: Diagnosis not present

## 2019-12-14 DIAGNOSIS — E785 Hyperlipidemia, unspecified: Secondary | ICD-10-CM

## 2019-12-14 DIAGNOSIS — I10 Essential (primary) hypertension: Secondary | ICD-10-CM | POA: Diagnosis not present

## 2019-12-14 DIAGNOSIS — I4891 Unspecified atrial fibrillation: Secondary | ICD-10-CM

## 2019-12-14 DIAGNOSIS — Z9189 Other specified personal risk factors, not elsewhere classified: Secondary | ICD-10-CM

## 2019-12-14 DIAGNOSIS — F39 Unspecified mood [affective] disorder: Secondary | ICD-10-CM

## 2019-12-14 DIAGNOSIS — F419 Anxiety disorder, unspecified: Secondary | ICD-10-CM

## 2019-12-14 DIAGNOSIS — Z7189 Other specified counseling: Secondary | ICD-10-CM

## 2019-12-14 DIAGNOSIS — Z719 Counseling, unspecified: Secondary | ICD-10-CM

## 2019-12-14 DIAGNOSIS — R7303 Prediabetes: Secondary | ICD-10-CM

## 2019-12-14 LAB — POCT GLYCOSYLATED HEMOGLOBIN (HGB A1C): Hemoglobin A1C: 6.1 % — AB (ref 4.0–5.6)

## 2019-12-14 NOTE — Progress Notes (Signed)
Telehealth office visit note for Daisy Nelson, D.O- at [facility] at [facility]   I connected with current patient today and verified that I am speaking with the correct person using two identifiers.    Location of the patient: Home  Location of the provider: Office Only the patient (+/- their family members at pt's discretion) and myself were participating in the encounter - This visit type was conducted due to national recommendations for restrictions regarding the COVID-19 Pandemic (e.g. social distancing) in an effort to limit this patient's exposure and mitigate transmission in our community.  This format is felt to be most appropriate for this patient at this time.   - No physical exam could be performed with this format, beyond that communicated to [place] by the patient/ family members as noted.   - Additionally my office staff/ schedulers discussed with the patient that there may be a monetary charge related to this service, depending on their medical insurance.   The patient expressed understanding, and agreed to proceed.       History of Present Illness: Hypertension and Hyperlipidemia   I, Daisy Nelson, am serving as scribe for Dr. Mellody Nelson.  [name]  - COVID-19 Exposure Her sister-in-law was diagnosed with COVID-19 yesterday, and patient had lunch with her last Friday.  Notes they went in to a restaurant to have lunch together.  - Episode of Atrial Fibrillation 4 Weeks Ago Notes about four weeks ago, she had an episode late in the evening around 8:30 at night, where she went into atrial fibrillation.  She took her flecainide as directed and notes "it took a couple of days, but things got right back to normal."    "That was probably the scariest one I've ever had; the last two episodes I had in the hospital.  This was the first one I dealt  with on my own."  Her heart rat ehad increased to 136, and she did call Dr. Debara Nelson of cardiology for consultation during this time.  Notes her heart rate usually runs between 50 and 55.  States she did not have sweats or nausea or anything like that, "and it got itself worked out."  - Vitamin D Deficiency Continues Vitamin D once weekly.  - Mood Management Continues on Zoloft.  States "even considering everything that's going on in this world, I'm good."  Says "things that are beyond my control, I'm not worried about or stressing over, because they are beyond my control."  Notes she is concerned with what's going on right now, "but I'm just going with the flow."  - Activity Levels She does engage in exercise; "I stay really active."  Notes "I get bored and I'm here with my cat all day every day because my husband works, so I have to get out and do something."  - Hobbies Notes usually reads in the evenings.  "I have certain programs I watch on TV after I've done housework."  States she will sit down and rest a little while watching an episode.  She also does adult coloring books and crafts every now and then.  - Prediabetes Last A1C in the office was:  Lab Results  Component Value Date   HGBA1C 6.1 (A) 12/14/2019   HGBA1C 6.4 (H) 06/12/2019   HGBA1C 6.4 (H) 03/22/2019   Lab Results  Component Value  Date   LDLCALC 49 06/12/2019   CREATININE 0.87 06/12/2019   BP Readings from Last 3 Encounters:  12/21/19 98/60  12/14/19 (!) 117/58  10/02/19 (!) 136/58   Wt Readings from Last 3 Encounters:  12/21/19 162 lb (73.5 kg)  12/14/19 157 lb (71.2 kg)  10/02/19 159 lb 9.6 oz (72.4 kg)   HPI:  Hypertension:  -  Her blood pressure at home has been running: "Good," reports 117/58 last check.  - Patient reports good compliance with medication and/or lifestyle modification  - Her denies acute concerns or problems related to treatment plan  - She denies new onset of: chest pain, exercise  intolerance, shortness of breath, dizziness, visual changes, headache, lower extremity swelling or claudication.   Last 3 blood pressure readings in our office are as follows: BP Readings from Last 3 Encounters:  12/21/19 98/60  12/14/19 (!) 117/58  10/02/19 (!) 136/58   Filed Weights   12/14/19 0830  Weight: 157 lb (71.2 kg)    HPI:  Hyperlipidemia:  74 y.o. female here for cholesterol follow-up.   - Patient reports good compliance with treatment plan of:  medication and/ or lifestyle management.    - Patient denies any acute concerns or problems with management plan   - She denies new onset of: myalgias, arthralgias, increased fatigue more than normal, chest pains, exercise intolerance, shortness of breath, dizziness, visual changes, headache, lower extremity swelling or claudication.   Most recent cholesterol panel was:  Lab Results  Component Value Date   CHOL 129 06/12/2019   HDL 59 06/12/2019   LDLCALC 49 06/12/2019   TRIG 107 06/12/2019   CHOLHDL 2.2 06/12/2019   Hepatic Function Latest Ref Rng & Units 06/12/2019 03/22/2019 03/29/2018  Total Protein 6.0 - 8.5 g/dL 6.4 6.6 6.5  Albumin 3.7 - 4.7 g/dL 4.1 4.2 3.8  AST 0 - 40 IU/L '24 25 24  ' ALT 0 - 32 IU/L '28 25 26  ' Alk Phosphatase 39 - 117 IU/L 99 98 104  Total Bilirubin 0.0 - 1.2 mg/dL 0.4 0.4 0.3       GAD 7 : Generalized Anxiety Score 06/12/2019 10/24/2018 06/23/2018  Nervous, Anxious, on Edge 0 0 0  Control/stop worrying 0 1 0  Worry too much - different things 0 0 0  Trouble relaxing 0 0 0  Restless 0 0 0  Easily annoyed or irritable 0 0 0  Afraid - awful might happen 0 0 0  Total GAD 7 Score 0 1 0    Depression screen [facility] 2/9 12/14/2019 10/02/2019 07/12/2019 06/12/2019 10/24/2018  Decreased Interest 0 0 0 0 0  Down, Depressed, Hopeless 0 0 0 0 0  PHQ - 2 Score 0 0 0 0 0  Altered sleeping '1 1 1 ' 0 1  Tired, decreased energy 0 0 0 0 0  Change in appetite 0 0 0 0 0  Feeling bad or failure about yourself  0  0 0 0 0  Trouble concentrating 0 0 0 0 0  Moving slowly or fidgety/restless 0 0 0 0 0  Suicidal thoughts 0 0 0 0 0  PHQ-9 Score '1 1 1 ' 0 1  Difficult doing work/chores Not difficult at all Not difficult at all Not difficult at all - Not difficult at all  Some recent data might be hidden      Impression and Recommendations:    1. Prediabetes   2. Hyperlipidemia, unspecified hyperlipidemia type   3. Atrial fibrillation with rapid ventricular response ([place])  4. Mood disorder ([place])   5. Anxiety   6. Hypertension, unspecified type   7. Vitamin D deficiency   8. At increased risk of exposure to COVID-19 virus   9. Advice given about COVID-19 virus infection   10. Health education/counseling       Prediabetes - Need for A1c today. - Patient will come in for A1c near future  - Reviewed critical importance of maintaining A1c WNL to help reduce cardiovascular risks.  - Counseled patient on prevention of diabetes and discussed dietary and lifestyle modification as first line.    - Importance of low carb, heart-healthy diet discussed with patient in addition to regular aerobic exercise of 51mn 5d/week or more.   - We will continue to monitor closely and re-check as discussed.   Hypertension - Blood pressure currently is at goal. - Patient will continue current treatment regimen.  - Counseled patient on pathophysiology of disease and discussed various treatment options, which always includes dietary and lifestyle modification as first line.   - Lifestyle changes such as dash and heart healthy diets and engaging in a regular exercise program discussed extensively with patient.   - Ambulatory blood pressure monitoring encouraged at least 3 times weekly.  Keep log and bring in every office visit.  Reminded patient that if they ever feel poorly in any way, to check their blood pressure and pulse.  - We will continue to monitor.   Atrial Fibrillation w/ Rapid Ventricular  Response - Stable at this time, asymptomatic.  - Per patient, recently experienced an episode (four weeks ago) and followed all directions of cardiology.  Her symptoms have since resolved.  - Encouraged patient to continue to follow all directions of cardiology and always call her specialist for guidance if she ever experiences symptoms.  Reviewed red flag symptoms with patient today.  - Continue management as established.  - Will continue to monitor alongside specialist.   Hyperlipidemia - At goal last check.  Last lipid panel obtained June 12, 2019: - Triglycerides = 107 - HDL = 59 - LDL = 49  - Cholesterol levels are stable. - Pt will continue current treatment regimen.  See med list.  - Encouraged ongoing prudent dietary changes such as low saturated & trans fat diets for hyperlipidemia and low carb diets for hypertriglyceridemia.  - Encouraged patient to follow AHA guidelines for regular exercise and also engage in weight loss if BMI above 25.   - We will continue to monitor and re-check as discussed.   Vitamin D Deficiency - Stable last check, 51.2 six months ago, up from 11 eight months ago. - Discussed goal of maintenance within 40-60 range. - Continue supplementation as established. - Will continue to monitor and re-check as discussed.   Mood Disorder, Anxiety - Stable at this time. - Continue management on Zoloft as established.  - In addition to prescription intervention, reviewed the "spokes of the wheel" of mood and health management.  Stressed the importance of ongoing prudent habits, including regular exercise, appropriate sleep hygiene, healthful dietary habits, and prayer/meditation to relax.  - Will continue to monitor.   Health Counseling & Preventative Maintenance - Advised patient to continue working toward exercising to improve overall mental, physical, and emotional health.    - Encouraged patient to engage in daily physical activity, especially a  formal exercise routine.  Recommended that the patient eventually strive for at least 150 minutes of moderate cardiovascular activity per week according to guidelines established by the [facility]   -  Healthy dietary habits encouraged, including low-carb, and high amounts of lean protein in diet.   - Patient should also consume adequate amounts of water.  - Health counseling performed.  All questions answered.   COVID-19 Counseling, Possible Recent Exposure - Novel Covid -19 counseling done; all questions were answered.    - Given possibility of recent exposure, advised patient to obtain testing - Discussed testing sites and prudent testing site etiquette with patient today.  - Told patient to quarantine herself as though she has COVID-19. - Advised patient to quarantine for at least 10 days etc.  - Current CDC / federal and [place] guidelines reviewed with patient  - Reminded pt of extreme importance of social distancing; wearing a mask when out in public; insensate handwashing and cleaning of surfaces, avoiding unnecessary trips for shopping and avoiding ALL but emergency appts etc. - Told patient to be prepared, not scared; and be smart for the sake of others - Patient will call with any additional concerns.     - As part of my medical decision making, I reviewed the following data within the [place] History obtained from pt /family, CMA notes reviewed and incorporated if applicable, Labs reviewed, Radiograph/ tests reviewed if applicable and OV notes from prior OV's with me, as well as other specialists she/he has seen since seeing me last, were all reviewed and used in my medical decision making process today.    - Additionally, discussion had with patient regarding our treatment plan, and their biases/concerns about that plan were used in my medical decision making today.    - The patient agreed with the plan and demonstrated an understanding of the instructions.    No barriers to understanding were identified.    - Red flag symptoms and signs discussed in detail.  Patient expressed understanding regarding what to do in case of emergency\ urgent symptoms.   - The patient was advised to call back or seek an in-person evaluation if the symptoms worsen or if the condition fails to improve as anticipated.   Return for f/up in 3 months to monitor A1c closely, with A1c and OV 3 days later in three months.    Orders Placed This Encounter  Procedures   POCT glycosylated hemoglobin (Hb A1C)    Medications Discontinued During This Encounter  Medication Reason   blood glucose meter kit and supplies KIT Error     I provided 20+ minutes of non face-to-face time during this encounter.  Additional time was spent with charting and coordination of care before and after the actual visit commenced.   Note:  This note was prepared with assistance of Dragon voice recognition software. Occasional wrong-word or sound-a-like substitutions may have occurred due to the inherent limitations of voice recognition software.  This document serves as a record of services personally performed by Daisy Dance, DO. It was created on her behalf by Daisy Nelson, a trained medical scribe. The creation of this record is based on the scribe's personal observations and the provider's statements to them.   This case required medical decision making of at least moderate complexity. The above documentation has been reviewed to be accurate and was completed by Marjory Sneddon, D.O.       Patient Care Team    Relationship Specialty Notifications Start End  Daisy Dance, DO PCP - General Family Medicine  05/10/18   Pixie Casino, MD Consulting Physician Cardiology  11/14/13   [name], MD Consulting Physician  Ophthalmology  07/18/16   Carol Ada, MD Consulting Physician Gastroenterology  09/22/16      -Vitals obtained; medications/ allergies  reconciled;  personal medical, social, Sx etc.histories were updated by CMA, reviewed by me and are reflected in chart   Patient Active Problem List   Diagnosis Date Noted   Prediabetes 03/26/2015   Atrial fibrillation with rapid ventricular response ([place]) 02/06/2013   Hyperlipidemia 10/25/2012   hx tob abuse;  30 yrs at roughly 1/2 ppd 05/11/2018   Drug-induced bradycardia-  rate controlled on metoprolol by cards, patient ASx 05/10/2018   GERD (gastroesophageal reflux disease) 10/25/2012   Anxiety 10/25/2012   Family history of coronary artery bypass surgery 02/06/2013   DJD (degenerative joint disease) of cervical spine 10/25/2012   Colon polyps 10/25/2012   Vitamin D deficiency 12/14/2019   Hypertension 06/12/2019   Environmental and seasonal allergies 10/24/2018   Mood disorder ([place]) 05/10/2018   Preretinal fibrosis, right eye 08/25/2016   PAF (paroxysmal atrial fibrillation) ([place]) 07/10/2013   Allergic rhinitis 10/25/2012   Nephrolithiasis 56/25/6389   Complication of anesthesia 11/30/2009     Current Meds  Medication Sig   atorvastatin (LIPITOR) 40 MG tablet TAKE 1 TABLET BY MOUTH AT BEDTIME(NEEDS OFFICE VISIT AND BLOOD WORK)   cimetidine (TAGAMET) 200 MG tablet Take 200 mg by mouth daily.   fexofenadine (ALLEGRA) 180 MG tablet Take 180 mg by mouth daily.   flecainide (TAMBOCOR) 100 MG tablet Take 2 tablets by mouth with onset of atrial fib. No more than once a week   fluticasone (FLONASE) 50 MCG/ACT nasal spray Use 2 spray(s) in each nostril once daily   metoprolol tartrate (LOPRESSOR) 25 MG tablet Take 1/2 (one-half) tablet by mouth twice daily   sertraline (ZOLOFT) 100 MG tablet Take 1 tablet by mouth once daily   Vitamin D, Ergocalciferol, (DRISDOL) 1.25 MG (50000 UT) CAPS capsule Take one tablet wkly   XARELTO 20 MG TABS tablet TAKE 1 TABLET BY MOUTH ONCE DAILY WITH SUPPER . APPOINTMENT REQUIRED FOR FUTURE REFILLS     Allergies:   Allergies  Allergen Reactions   Adhesive [Tape]     Burn skin   Erythromycin     Gi intolerance - nausea, vomiting, and abd pains   Naproxen Nausea And Vomiting    Abdominal pain    Penicillins Hives    Has patient had a PCN reaction causing immediate rash, facial/tongue/throat swelling, SOB or lightheadedness with hypotension: UNKNOWN Has patient had a PCN reaction causing severe rash involving mucus membranes or skin necrosis: No Has patient had a PCN reaction that required hospitalization No Has patient had a PCN reaction occurring within the last 10 years: No If all of the above answers are "NO", then may proceed with Cephalosporin use.      ROS:  See above HPI for pertinent positives and negatives   Objective:   Blood pressure (!) 117/58, pulse (!) 51, height '5\' 5"'  (3.734 m), weight 157 lb (71.2 kg).  (if some vitals are omitted, this means that patient was UNABLE to obtain them even though they were asked to get them prior to OV today.  They were asked to call us at their earliest convenience with these once obtained. )  General: A & O * 3; sounds in no acute distress; in usual state of health.  Skin: Pt confirms warm and dry extremities and pink fingertips HEENT: Pt confirms lips non-cyanotic Chest: Patient confirms normal chest excursion and movement Respiratory: speaking in full sentences, no  conversational dyspnea; patient confirms no use of accessory muscles Psych: insight appears good, mood- appears full

## 2019-12-15 ENCOUNTER — Ambulatory Visit: Payer: PPO | Attending: Internal Medicine

## 2019-12-15 DIAGNOSIS — Z20822 Contact with and (suspected) exposure to covid-19: Secondary | ICD-10-CM | POA: Diagnosis not present

## 2019-12-16 LAB — NOVEL CORONAVIRUS, NAA: SARS-CoV-2, NAA: NOT DETECTED

## 2019-12-21 ENCOUNTER — Encounter: Payer: Self-pay | Admitting: Gastroenterology

## 2019-12-21 ENCOUNTER — Ambulatory Visit: Payer: PPO | Admitting: Gastroenterology

## 2019-12-21 VITALS — BP 98/60 | HR 55 | Temp 97.6°F | Ht 65.0 in | Wt 162.0 lb

## 2019-12-21 DIAGNOSIS — Z7901 Long term (current) use of anticoagulants: Secondary | ICD-10-CM | POA: Diagnosis not present

## 2019-12-21 DIAGNOSIS — Z86010 Personal history of colonic polyps: Secondary | ICD-10-CM | POA: Diagnosis not present

## 2019-12-21 NOTE — Progress Notes (Signed)
Daisy Nelson Note:  History: Daisy Nelson 12/21/2019  Referring provider: Mellody Dance, DO  Reason for Nelson/chief complaint: Colon Cancer Screening (hx of colon polyps, on Xarelto for A fib. Previous colonoscopy with Dr Benson Norway in 08/2016)   Subjective  HPI: Daisy Nelson was referred by primary care to establish care with [place] for history of colon polyps.  My notes after chart review 2 months ago when referral was received:  "Records received from Dr. Ulyses Amor office.  For documentation purposes:   Office note from July 2011 with lower abdominal pain, CT abdomen and pelvis showing sigmoid diverticulitis   Colonoscopy September 2011 with a diminutive adenomatous and diminutive hyperplastic polyp   Colonoscopy October 2017 noting severe left-sided diverticulosis causing difficulty with scope passage.  Cecum was eventually reached, preparation excellent.  9 polyps were reportedly removed, not entirely clear which polyps were placed in which specimen bottles.  There were 2 polyps reportedly 8 to 10 mm in the cecum removed piecemeal with hot snare.  Pathology jar 1 has multiple fragments of adenomatous polyp, pathology jar 2 hyperplastic polyp)     (Note to clinician: The procedure should be done in the hospital endoscopy lab, as special scope or devices may be necessary due to the above-noted factors)     ______________________________________________________   This patient is due for a surveillance colonoscopy.  She is not previously been seen by this practice, is on oral anticoagulation, and thus needs an office appointment with me or one of the apps first."  __________________________________  Daisy Nelson denies chronic abdominal pain, altered bowel habits or rectal bleeding.  She does not have dysphagia, odynophagia, nausea, vomiting, early satiety or weight loss.  She recalls Dr. Ulyses Amor previous description of difficult scope passage and multiple polyps as  noted above.  She was off her anticoagulation briefly before the procedure and did not have any post polypectomy bleeding.  ROS:  Review of Systems She denies chest pain dyspnea or dysuria Arthralgias  Past Medical History: Past Medical History:  Diagnosis Date  . AF (atrial fibrillation) ([place])    AF with RVR 01/2013  . Allergy   . Anxiety   . Atrial fibrillation with RVR ([place]) 08/31/2015  . Colon polyps   . Complication of anesthesia 2011   colonscopy- "slow to awaken"  . Depression   . DJD (degenerative joint disease) of cervical spine   . Family history of CABG   . GERD (gastroesophageal reflux disease)   . History of kidney stones   . Hyperglycemia   . Hyperlipidemia   . Jaw pain 08/31/2015  . Pneumonia   . PONV (postoperative nausea and vomiting)      Past Surgical History: Past Surgical History:  Procedure Laterality Date  . [place] VITRECTOMY WITH 20 GAUGE MVR PORT FOR MACULAR HOLE Right 08/25/2016   Procedure: 25 GAUGE PARS PLANA VITRECTOMY; RIGHT EYE;  Surgeon: [name], MD;  Location: [facility];  Service: Ophthalmology;  Laterality: Right;  . ABDOMINAL HYSTERECTOMY  1991  . CATARACT EXTRACTION W/ INTRAOCULAR LENS  IMPLANT, BILATERAL Bilateral 03/2013  . CESAREAN SECTION  1976  . COLONOSCOPY  2011   polyps  . EYE SURGERY    . GANGLION CYST EXCISION Left    hand  . GAS/FLUID EXCHANGE Right 08/25/2016   Procedure: GAS/FLUID EXCHANGE RIGHT EYE;  Surgeon: [name], MD;  Location: [facility];  Service: Ophthalmology;  Laterality: Right;  . IRIDECTOMY Right 08/25/2016   Procedure: PERIPHERAL IRIDECTOMY  RIGHT EYE;  Surgeon: [name], MD;  Location: [place];  Service: Ophthalmology;  Laterality: Right;  . LAPAROSCOPY  1976  . MEMBRANE PEEL Right 08/25/2016   Procedure: MEMBRANE PEEL RIGHT EYE;  Surgeon: [name], MD;  Location: [facility];  Service: Ophthalmology;  Laterality: Right;  . NM MYOCAR PERF WALL MOTION  01/2013   lexiscan - no  pharmacologically induced ischemia, fixed defects in apical segments of anterior septum & inferior lateral wall   . PARS PLANA VITRECTOMY Right 08/25/2016   laser, gas injection, AC wash out. Membrane peel right eye  . TRANSTHORACIC ECHOCARDIOGRAM  01/2013   EF 55-60%, mod conc LVH; mild MR with mildly thickened leaflets; mild TR; PA peak pressure 25mmHg  . TUBAL LIGATION  1977     Family History: Family History  Problem Relation Age of Onset  . Stroke Mother   . Diabetes Father   . Heart disease Father        CABG  . Arthritis Father        C6-7  . Skin cancer Father        SCC  . Arthritis Brother        s/p THR    Social History: Social History   Socioeconomic History  . Marital status: Married    Spouse name: Herbie [place]  . Number of children: 1  . Years of education: 64  . Highest education level: Not on file  Occupational History  . Occupation: Medical illustrator  Tobacco Use  . Smoking status: Former Smoker    Packs/day: 0.50    Years: 36.00    Pack years: 18.00    Types: Cigarettes    Start date: 03/15/1963    Quit date: 01/08/2005    Years since quitting: 14.9  . Smokeless tobacco: Never Used  Substance and Sexual Activity  . Alcohol use: No  . Drug use: No  . Sexual activity: Yes    Partners: Male    Birth control/protection: Surgical  Other Topics Concern  . Not on file  Social History Narrative   Lives with her husband and their cat, Elvis Coil.   Social Determinants of Health   Financial Resource Strain:   . Difficulty of Paying Living Expenses: Not on file  Food Insecurity:   . Worried About Charity fundraiser in the Last Year: Not on file  . Ran Out of Food in the Last Year: Not on file  Transportation Needs:   . Lack of Transportation (Medical): Not on file  . Lack of Transportation (Non-Medical): Not on file  Physical Activity:   . Days of Exercise per Week: Not on file  . Minutes of Exercise per Session: Not on file  Stress:   . Feeling of  Stress : Not on file  Social Connections:   . Frequency of Communication with Friends and Family: Not on file  . Frequency of Social Gatherings with Friends and Family: Not on file  . Attends Religious Services: Not on file  . Active Member of Clubs or Organizations: Not on file  . Attends Archivist Meetings: Not on file  . Marital Status: Not on file    Allergies: Allergies  Allergen Reactions  . Adhesive [Tape]     Burn skin  . Erythromycin     Gi intolerance - nausea, vomiting, and abd pains  . Naproxen Nausea And Vomiting    Abdominal pain   . Penicillins Hives    Has patient  had a PCN reaction causing immediate rash, facial/tongue/throat swelling, SOB or lightheadedness with hypotension: UNKNOWN Has patient had a PCN reaction causing severe rash involving mucus membranes or skin necrosis: No Has patient had a PCN reaction that required hospitalization No Has patient had a PCN reaction occurring within the last 10 years: No If all of the above answers are "NO", then may proceed with Cephalosporin use.     Outpatient Meds: Current Outpatient Medications  Medication Sig Dispense Refill  . atorvastatin (LIPITOR) 40 MG tablet TAKE 1 TABLET BY MOUTH AT BEDTIME(NEEDS OFFICE VISIT AND BLOOD WORK) 90 tablet 0  . cimetidine (TAGAMET) 200 MG tablet Take 200 mg by mouth daily.    . fexofenadine (ALLEGRA) 180 MG tablet Take 180 mg by mouth daily.    . flecainide (TAMBOCOR) 100 MG tablet Take 2 tablets by mouth with onset of atrial fib. No more than once a week 6 tablet 5  . fluticasone (FLONASE) 50 MCG/ACT nasal spray Use 2 spray(s) in each nostril once daily 48 g 0  . metoprolol tartrate (LOPRESSOR) 25 MG tablet Take 1/2 (one-half) tablet by mouth twice daily 90 tablet 3  . sertraline (ZOLOFT) 100 MG tablet Take 1 tablet by mouth once daily 90 tablet 0  . Vitamin D, Ergocalciferol, (DRISDOL) 1.25 MG (50000 UT) CAPS capsule Take one tablet wkly 12 capsule 3  . XARELTO 20 MG  TABS tablet TAKE 1 TABLET BY MOUTH ONCE DAILY WITH SUPPER . APPOINTMENT REQUIRED FOR FUTURE REFILLS 90 tablet 0   No current facility-administered medications for this visit.      ___________________________________________________________________ Objective   Exam:  BP 98/60   Pulse (!) 55   Temp 97.6 F (36.4 C)   Ht 5\' 5"  (1.651 m)   Wt 162 lb (73.5 kg)   BMI 26.96 kg/m    General: Well-appearing  Eyes: sclera anicteric, no redness  ENT: oral mucosa moist without lesions, no cervical or supraclavicular lymphadenopathy  CV: RRR without murmur, S1/S2, no JVD, no peripheral edema  Resp: clear to auscultation bilaterally, normal RR and effort noted  GI: soft, no tenderness, with active bowel sounds. No guarding or palpable organomegaly noted.   Assessment: Encounter Diagnoses  Name Primary?  . Personal history of colonic polyps Yes  . Current use of long term anticoagulation     Given the previous challenges on last colonoscopy as well as the length of time since that exam and therefore concern for residual or recurrent polyps at sites of previous piecemeal polypectomy, I feel it would be best to perform her procedure in the hospital endoscopy lab.  This would give [place] a greater selection of endoscopic equipment for home will likely be a challenging procedure.  Plan:  We had to put her on our wait list for hospital procedures due to current Covid restrictions.  When we are able to schedule her, we will contact her cardiologist regarding a brief hold of her oral anticoagulation.  She understands the small but real risk of stroke while being off the medication, but I think the risk is acceptable given her overall condition and increased risk of colorectal polyps.  Thank you for the courtesy of this Nelson.  Please call me with any questions or concerns.  Nelida Meuse III  CC: Referring provider noted above

## 2019-12-21 NOTE — Patient Instructions (Signed)
If you are age 72 or older, your body mass index should be between 23-30. Your Body mass index is 26.96 kg/m. If this is out of the aforementioned range listed, please consider follow up with your Primary Care Provider.  If you are age 30 or younger, your body mass index should be between 19-25. Your Body mass index is 26.96 kg/m. If this is out of the aformentioned range listed, please consider follow up with your Primary Care Provider.   You will be put on the wait list for a colonoscopy.  Someone will notify you when we have an available appointment date and time.    It was a pleasure to see you today!  Dr. Loletha Carrow

## 2019-12-22 ENCOUNTER — Other Ambulatory Visit: Payer: PPO

## 2019-12-27 ENCOUNTER — Encounter: Payer: Self-pay | Admitting: Family Medicine

## 2019-12-28 ENCOUNTER — Other Ambulatory Visit: Payer: Self-pay

## 2019-12-28 ENCOUNTER — Ambulatory Visit (INDEPENDENT_AMBULATORY_CARE_PROVIDER_SITE_OTHER): Payer: PPO | Admitting: Family Medicine

## 2019-12-28 ENCOUNTER — Encounter: Payer: Self-pay | Admitting: Family Medicine

## 2019-12-28 VITALS — BP 96/57 | HR 55 | Temp 98.2°F | Ht 65.0 in | Wt 159.8 lb

## 2019-12-28 DIAGNOSIS — R21 Rash and other nonspecific skin eruption: Secondary | ICD-10-CM | POA: Diagnosis not present

## 2019-12-28 DIAGNOSIS — B372 Candidiasis of skin and nail: Secondary | ICD-10-CM | POA: Diagnosis not present

## 2019-12-28 DIAGNOSIS — R7303 Prediabetes: Secondary | ICD-10-CM

## 2019-12-28 LAB — POCT GLYCOSYLATED HEMOGLOBIN (HGB A1C): Hemoglobin A1C: 6.4 % — AB (ref 4.0–5.6)

## 2019-12-28 MED ORDER — FLUCONAZOLE 150 MG PO TABS: ORAL_TABLET | ORAL | 0 refills | Status: DC

## 2019-12-28 MED ORDER — HYDROXYZINE HCL 50 MG PO TABS: 50.0000 mg | ORAL_TABLET | Freq: Every day | ORAL | 0 refills | Status: DC

## 2019-12-28 MED ORDER — NYSTATIN-TRIAMCINOLONE 100000-0.1 UNIT/GM-% EX OINT: TOPICAL_OINTMENT | CUTANEOUS | 0 refills | Status: DC

## 2019-12-28 NOTE — Progress Notes (Signed)
Pt here for an acute care OV today  Impression and Recommendations:    1. Rash and nonspecific skin eruption   2. Intertriginous candidiasis   3. Prediabetes      Rash and Nonspecific Skin Eruption - Lengthy conversation held with patient today. - Reviewed symptoms of yeast vs contact dermatitis. - Discussed that patient's rash is likely due to yeast.  - Advised patient that yeast often grow in dark spaces like the groin, in the folds of our skin where it's kept moist and dark.  - A1c obtained today due to rash and concurrent prediabetes.  - Prescription topical cream provided today. - Prescription pill diflucan prescribed today. - Vistaril prescribed for alleviation of itch at night.  - Advised patient to keep the area open to air as much as possible.  - During the day, to absorb excess moisture, advised patient to use diaper rash powder with antifungal properties.  - Advised patient to continue to bathe herself prudently with soap for sensitive skin. - Continue to use cotton underwear and make no other changes to daily routine.  - Will continue to monitor. - If patient's symptoms do not improve, discussed need for further evaluation.   Meds ordered this encounter  Medications  . fluconazole (DIFLUCAN) 150 MG tablet    Sig: Repeat wkly * 4    Dispense:  4 tablet    Refill:  0  . nystatin-triamcinolone ointment (MYCOLOG)    Sig: Applied to rash twice daily until resolved    Dispense:  60 g    Refill:  0  . hydrOXYzine (ATARAX/VISTARIL) 50 MG tablet    Sig: Take 1 tablet (50 mg total) by mouth at bedtime. Prn itch    Dispense:  30 tablet    Refill:  0     Orders Placed This Encounter  Procedures  . POCT glycosylated hemoglobin (Hb A1C)     Education and routine counseling performed. Handouts provided  Gross side effects, risk and benefits, and alternatives of medications and treatment plan in general discussed with patient.  Patient is aware that  all medications have potential side effects and we are unable to predict every side effect or drug-drug interaction that may occur.   Patient will call with any questions prior to using medication if they have concerns.    Expresses verbal understanding and consents to current therapy and treatment regimen.  No barriers to understanding were identified.  Red flag symptoms and signs discussed in detail.  Patient expressed understanding regarding what to do in case of emergency\urgent symptoms   Please see AVS handed out to patient at the end of our visit for further patient instructions/ counseling done pertaining to today's office visit.   Return if symptoms worsen or fail to improve, for near future for chronic care.     Note:  This document was prepared occasionally using Dragon voice recognition software and may include unintentional dictation errors in addition to a scribe.  This document serves as a record of services personally performed by Mellody Dance, DO. It was created on her behalf by Toni Amend, a trained medical scribe. The creation of this record is based on the scribe's personal observations and the provider's statements to them.   This case required medical decision making of at least moderate complexity. The above documentation has been reviewed to be accurate and was completed by Marjory Sneddon, D.O.    --------------------------------------------------------------------------------------------------------------------------------------------------------------------------------------------------------------------------------------------    Subjective:    CC:  Chief Complaint  Patient presents with  . Dermatitis    HPI: Daisy Nelson is a 74 y.o. female who presents to [place] [facility] today for issues as discussed below.  - Rash in Pelvic Area Notes she last had this rash back in August.  Thinks this incident started on  Saturday, so today is her fifth day.  Notes it's "a little bit in the groin area."  Says "walking out in the cool air felt really good."  Started with redness and itching, patient indicates starting in the groin folds.  Then it became red and "burny."  She has tried to think of anything she's done differently, "and there's nothing.  I've used the same detergent, the same fabric softeners, the same soap to bathe in."  Notes she uses Dove to bathe her more sensitive regions.  Thinks she had this rash once before, in the past.  Has not changed her diet or done anything differently.  Has tried some OTC Calmoseptine ointment, states "for irritation caused by urine or feces."  Notes she doesn't sweat a lot in her pelvic area, "tries to wear all cotton stuff," and wears blue jeans a lot.  Has not purchased or worn new underwear.  Denies vaginal discharge.  Notes the itching is worse at night, "and I know I'm not supposed to scratch, but gosh."  Denies itching in her buttocks.  - Recent Stress Notes her husband had a quintuple bypass on Tuesday; he had a heart attack on Saturday morning.  States that he's doing really well right now and much better.  With her husband's concerns, "It's been stressful, a couple of nights not a lot of sleep," but she's doing well.     No problems updated.   Wt Readings from Last 3 Encounters:  12/28/19 159 lb 12 oz (72.5 kg)  12/21/19 162 lb (73.5 kg)  12/14/19 157 lb (71.2 kg)   BP Readings from Last 3 Encounters:  12/28/19 (!) 96/57  12/21/19 98/60  12/14/19 (!) 117/58   BMI Readings from Last 3 Encounters:  12/28/19 26.58 kg/m  12/21/19 26.96 kg/m  12/14/19 26.13 kg/m     Patient Care Team    Relationship Specialty Notifications Start End  Mellody Dance, DO PCP - General Family Medicine  05/10/18   Pixie Casino, MD Consulting Physician Cardiology  11/14/13   [name], MD Consulting Physician Ophthalmology  07/18/16     Carol Ada, MD Consulting Physician Gastroenterology  09/22/16      Patient Active Problem List   Diagnosis Date Noted  . Prediabetes 03/26/2015  . Atrial fibrillation with rapid ventricular response ([place]) 02/06/2013  . Hyperlipidemia 10/25/2012  . hx tob abuse;  30 yrs at roughly 1/2 ppd 05/11/2018  . Drug-induced bradycardia-  rate controlled on metoprolol by cards, patient ASx 05/10/2018  . GERD (gastroesophageal reflux disease) 10/25/2012  . Anxiety 10/25/2012  . Family history of coronary artery bypass surgery 02/06/2013  . DJD (degenerative joint disease) of cervical spine 10/25/2012  . Colon polyps 10/25/2012  . Vitamin D deficiency 12/14/2019  . Hypertension 06/12/2019  . Environmental and seasonal allergies 10/24/2018  . Mood disorder ([place]) 05/10/2018  . Preretinal fibrosis, right eye 08/25/2016  . PAF (paroxysmal atrial fibrillation) ([place]) 07/10/2013  . Allergic rhinitis 10/25/2012  . Nephrolithiasis 10/25/2012  . Complication of anesthesia 11/30/2009      Past Medical History:  Diagnosis Date  . AF (atrial fibrillation) (HCC)    AF  with RVR 01/2013  . Allergy   . Anxiety   . Atrial fibrillation with RVR ([place]) 08/31/2015  . Colon polyps   . Complication of anesthesia 2011   colonscopy- "slow to awaken"  . Depression   . DJD (degenerative joint disease) of cervical spine   . Family history of CABG   . GERD (gastroesophageal reflux disease)   . History of kidney stones   . Hyperglycemia   . Hyperlipidemia   . Jaw pain 08/31/2015  . Pneumonia   . PONV (postoperative nausea and vomiting)      Past Surgical History:  Procedure Laterality Date  . [place] VITRECTOMY WITH 20 GAUGE MVR PORT FOR MACULAR HOLE Right 08/25/2016   Procedure: 25 GAUGE PARS PLANA VITRECTOMY; RIGHT EYE;  Surgeon: [name], MD;  Location: [place];  Service: Ophthalmology;  Laterality: Right;  . ABDOMINAL HYSTERECTOMY  1991  . CATARACT EXTRACTION W/ INTRAOCULAR LENS   IMPLANT, BILATERAL Bilateral 03/2013  . CESAREAN SECTION  1976  . COLONOSCOPY  2011   polyps  . EYE SURGERY    . GANGLION CYST EXCISION Left    hand  . GAS/FLUID EXCHANGE Right 08/25/2016   Procedure: GAS/FLUID EXCHANGE RIGHT EYE;  Surgeon: [name], MD;  Location: [place];  Service: Ophthalmology;  Laterality: Right;  . IRIDECTOMY Right 08/25/2016   Procedure: PERIPHERAL IRIDECTOMY RIGHT EYE;  Surgeon: [name], MD;  Location: [place];  Service: Ophthalmology;  Laterality: Right;  . LAPAROSCOPY  1976  . MEMBRANE PEEL Right 08/25/2016   Procedure: MEMBRANE PEEL RIGHT EYE;  Surgeon: [name], MD;  Location: [place];  Service: Ophthalmology;  Laterality: Right;  . NM MYOCAR PERF WALL MOTION  01/2013   lexiscan - no pharmacologically induced ischemia, fixed defects in apical segments of anterior septum & inferior lateral wall   . PARS PLANA VITRECTOMY Right 08/25/2016   laser, gas injection, AC wash out. Membrane peel right eye  . TRANSTHORACIC ECHOCARDIOGRAM  01/2013   EF 55-60%, mod conc LVH; mild MR with mildly thickened leaflets; mild TR; PA peak pressure 27mmHg  . TUBAL LIGATION  1977     Family History  Problem Relation Age of Onset  . Stroke Mother   . Diabetes Father   . Heart disease Father        CABG  . Arthritis Father        C6-7  . Skin cancer Father        SCC  . Arthritis Brother        s/p THR     Social History   Socioeconomic History  . Marital status: Married    Spouse name: Herbie Baltimore  . Number of children: 1  . Years of education: 76  . Highest education level: Not on file  Occupational History  . Occupation: Medical illustrator  Tobacco Use  . Smoking status: Former Smoker    Packs/day: 0.50    Years: 36.00    Pack years: 18.00    Types: Cigarettes    Start date: 03/15/1963    Quit date: 01/08/2005    Years since quitting: 14.9  . Smokeless tobacco: Never Used  Substance and Sexual Activity  . Alcohol use: No  . Drug use: No  . Sexual  activity: Yes    Partners: Male    Birth control/protection: Surgical  Other Topics Concern  . Not on file  Social History Narrative   Lives with her husband and their  cat, Natoma.   Social Determinants of Health   Financial Resource Strain:   . Difficulty of Paying Living Expenses: Not on file  Food Insecurity:   . Worried About Charity fundraiser in the Last Year: Not on file  . Ran Out of Food in the Last Year: Not on file  Transportation Needs:   . Lack of Transportation (Medical): Not on file  . Lack of Transportation (Non-Medical): Not on file  Physical Activity:   . Days of Exercise per Week: Not on file  . Minutes of Exercise per Session: Not on file  Stress:   . Feeling of Stress : Not on file  Social Connections:   . Frequency of Communication with Friends and Family: Not on file  . Frequency of Social Gatherings with Friends and Family: Not on file  . Attends Religious Services: Not on file  . Active Member of Clubs or Organizations: Not on file  . Attends Archivist Meetings: Not on file  . Marital Status: Not on file  Intimate Partner Violence:   . Fear of Current or Ex-Partner: Not on file  . Emotionally Abused: Not on file  . Physically Abused: Not on file  . Sexually Abused: Not on file     Current Meds  Medication Sig  . atorvastatin (LIPITOR) 40 MG tablet TAKE 1 TABLET BY MOUTH AT BEDTIME(NEEDS OFFICE VISIT AND BLOOD WORK)  . cimetidine (TAGAMET) 200 MG tablet Take 200 mg by mouth daily.  . fexofenadine (ALLEGRA) 180 MG tablet Take 180 mg by mouth daily.  . flecainide (TAMBOCOR) 100 MG tablet Take 2 tablets by mouth with onset of atrial fib. No more than once a week  . fluticasone (FLONASE) 50 MCG/ACT nasal spray Use 2 spray(s) in each nostril once daily  . metoprolol tartrate (LOPRESSOR) 25 MG tablet Take 1/2 (one-half) tablet by mouth twice daily  . sertraline (ZOLOFT) 100 MG tablet Take 1 tablet by mouth once daily  . Vitamin D,  Ergocalciferol, (DRISDOL) 1.25 MG (50000 UT) CAPS capsule Take one tablet wkly  . XARELTO 20 MG TABS tablet TAKE 1 TABLET BY MOUTH ONCE DAILY WITH SUPPER . APPOINTMENT REQUIRED FOR FUTURE REFILLS    Allergies:  Allergies  Allergen Reactions  . Adhesive [Tape]     Burn skin  . Erythromycin     Gi intolerance - nausea, vomiting, and abd pains  . Naproxen Nausea And Vomiting    Abdominal pain   . Penicillins Hives    Has patient had a PCN reaction causing immediate rash, facial/tongue/throat swelling, SOB or lightheadedness with hypotension: UNKNOWN Has patient had a PCN reaction causing severe rash involving mucus membranes or skin necrosis: No Has patient had a PCN reaction that required hospitalization No Has patient had a PCN reaction occurring within the last 10 years: No If all of the above answers are "NO", then may proceed with Cephalosporin use.      Review of Systems: General:   Denies fever, chills, unexplained weight loss.  Optho/Auditory:   Denies visual changes, blurred vision/LOV Respiratory:   Denies wheeze, DOE more than baseline levels.   Cardiovascular:   Denies chest pain, palpitations, new onset peripheral edema  Gastrointestinal:   Denies nausea, vomiting, diarrhea, abd pain.  Genitourinary: Denies dysuria, freq/ urgency, flank pain or discharge from genitals.  Endocrine:     Denies hot or cold intolerance, polyuria, polydipsia. Musculoskeletal:   Denies unexplained myalgias, joint swelling, unexplained arthralgias, gait problems.  Skin:  Denies new onset rash, suspicious lesions Neurological:     Denies dizziness, unexplained weakness, numbness  Psychiatric/Behavioral:   Denies mood changes, suicidal or homicidal ideations, hallucinations    Objective:   Blood pressure (!) 96/57, pulse (!) 55, temperature 98.2 F (36.8 C), temperature source Oral, height 5\' 5"  (1.651 m), weight 159 lb 12 oz (72.5 kg), SpO2 97 %. Body mass index is 26.58 kg/m. General:   Well Developed, well nourished, appropriate for stated age.  Neuro:  Alert and oriented,  extra-ocular muscles intact  HEENT:  Normocephalic, atraumatic Skin: erythema appreciated in the bilateral groin folds; external vagina/ labial folds etc WNL, no erythema, no discharge in that region at all.  No swelling of the tissues, or erythema around the rectum. Skin otherwise warm, pink. Cardiac:  Normal chest excursion Respiratory:  Not using accessory muscles, speaking in full sentences- unlabored. Vascular:  Ext warm, no cyanosis apprec.; cap RF less 2 sec. Psych:  judgement and insight good, Euthymic mood. Full Affect

## 2019-12-28 NOTE — Patient Instructions (Signed)
Intertrigo Intertrigo is skin irritation or inflammation (dermatitis) that occurs when folds of skin rub together. The irritation can cause a rash and make skin raw and itchy. This condition most commonly occurs in the skin folds of these areas:  Toes.  Armpits.  Groin.  Under the belly.  Under the breasts.  Buttocks. Intertrigo is not passed from person to person (is not contagious). What are the causes? This condition is caused by heat, moisture, rubbing (friction), and not enough air circulation. The condition can be made worse by:  Sweat.  Bacteria.  A fungus, such as yeast. What increases the risk? This condition is more likely to occur if you have moisture in your skin folds. You are more likely to develop this condition if you:  Have diabetes.  Are overweight.  Are not able to move around or are not active.  Live in a warm and moist climate.  Wear splints, braces, or other medical devices.  Are not able to control your bowels or bladder (have incontinence). What are the signs or symptoms? Symptoms of this condition include:  A pink or red skin rash in the skin fold or near the skin fold.  Raw or scaly skin.  Itchiness.  A burning feeling.  Bleeding.  Leaking fluid.  A bad smell. How is this diagnosed? This condition is diagnosed with a medical history and physical exam. You may also have a skin swab to test for bacteria or a fungus. How is this treated? This condition may be treated by:  Cleaning and drying your skin.  Taking an antibiotic medicine or using an antibiotic skin cream for a bacterial infection.  Using an antifungal cream on your skin or taking pills for an infection that was caused by a fungus, such as yeast.  Using a steroid ointment to relieve itchiness and irritation.  Separating the skin fold with a clean cotton cloth to absorb moisture and allow air to flow into the area. Follow these instructions at home:  Keep the  affected area clean and dry.  Do not scratch your skin.  Stay in a cool environment as much as possible. Use an air conditioner or fan, if available.  Apply over-the-counter and prescription medicines only as told by your health care provider.  If you were prescribed an antibiotic medicine, use it as told by your health care provider. Do not stop using the antibiotic even if your condition improves.  Keep all follow-up visits as told by your health care provider. This is important. How is this prevented?   Maintain a healthy weight.  Take care of your feet, especially if you have diabetes. Foot care includes: ? Wearing shoes that fit well. ? Keeping your feet dry. ? Wearing clean, breathable socks.  Protect the skin around your groin and buttocks, especially if you have incontinence. Skin protection includes: ? Following a regular cleaning routine. ? Using skin protectant creams, powders, or ointments. ? Changing protection pads frequently.  Do not wear tight clothes. Wear clothes that are loose, absorbent, and made of cotton.  Wear a bra that gives good support, if needed.  Shower and dry yourself well after activity or exercise. Use a hair dryer on a cool setting to dry between skin folds, especially after you bathe.  If you have diabetes, keep your blood sugar under control. Contact a health care provider if:  Your symptoms do not improve with treatment.  Your symptoms get worse or they spread.  You notice increased redness and   warmth.  You have a fever. Summary  Intertrigo is skin irritation or inflammation (dermatitis) that occurs when folds of skin rub together.  This condition is caused by heat, moisture, rubbing (friction), and not enough air circulation.  This condition may be treated by cleaning and drying your skin and with medicines.  Apply over-the-counter and prescription medicines only as told by your health care provider.  Keep all follow-up visits  as told by your health care provider. This is important. This information is not intended to replace advice given to you by your health care provider. Make sure you discuss any questions you have with your health care provider. Document Revised: 04/18/2018 Document Reviewed: 04/18/2018 Elsevier Patient Education  2020 Elsevier Inc.  

## 2020-01-04 ENCOUNTER — Other Ambulatory Visit: Payer: PPO

## 2020-01-08 ENCOUNTER — Other Ambulatory Visit: Payer: Self-pay | Admitting: Family Medicine

## 2020-01-08 DIAGNOSIS — F419 Anxiety disorder, unspecified: Secondary | ICD-10-CM

## 2020-01-20 ENCOUNTER — Ambulatory Visit: Payer: PPO | Attending: Internal Medicine

## 2020-01-20 DIAGNOSIS — Z23 Encounter for immunization: Secondary | ICD-10-CM | POA: Insufficient documentation

## 2020-01-20 NOTE — Progress Notes (Signed)
   Covid-19 Vaccination Clinic  Name:  Daisy Nelson    MRN: [id number] DOB: 1946/05/19  01/20/2020  [name] was observed post Covid-19 immunization for 15 minutes without incidence. She was provided with Vaccine Information Sheet and instruction to access the V-Safe system.   [name] was instructed to call 911 with any severe reactions post vaccine: Marland Kitchen Difficulty breathing  . Swelling of your face and throat  . A fast heartbeat  . A bad rash all over your body  . Dizziness and weakness    Immunizations Administered    Name Date Dose VIS Date Route   Pfizer COVID-19 Vaccine 01/20/2020  1:54 PM 0.3 mL 11/10/2019 Intramuscular   Manufacturer: [place]   Lot: [id number]   [place]: [id number]

## 2020-01-22 ENCOUNTER — Other Ambulatory Visit: Payer: Self-pay | Admitting: Internal Medicine

## 2020-02-11 IMAGING — MG DIGITAL SCREENING BILATERAL MAMMOGRAM WITH TOMO AND CAD
8 series · 8 of 24 positions shown · non-contrast
Comparison: Previous exam(s).

CLINICAL DATA: Screening.

EXAM:
DIGITAL SCREENING BILATERAL MAMMOGRAM WITH TOMO AND CAD

[L MLO synth-2D]
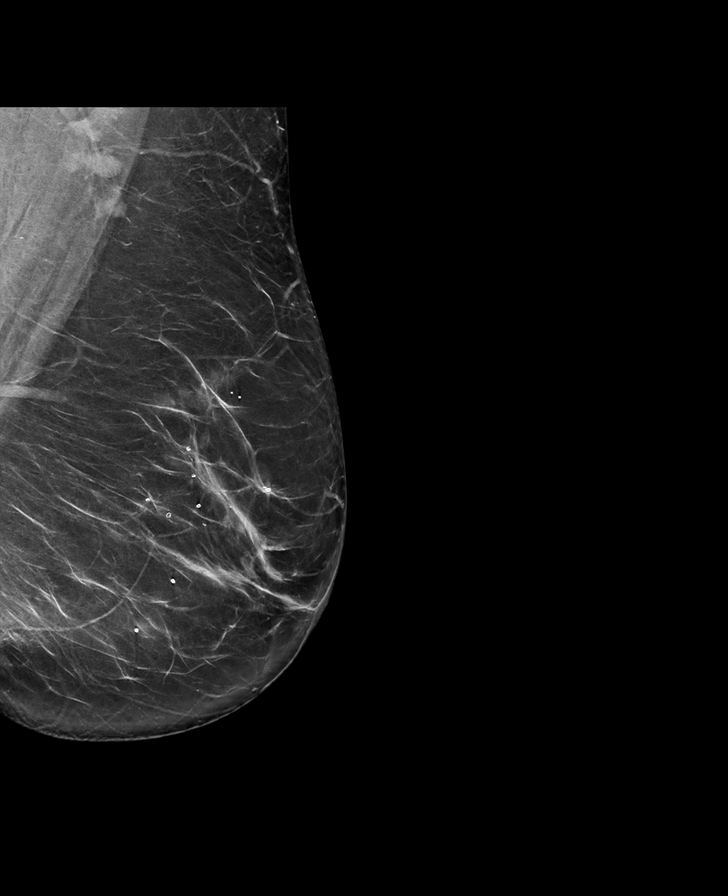

[R MLO synth-2D]
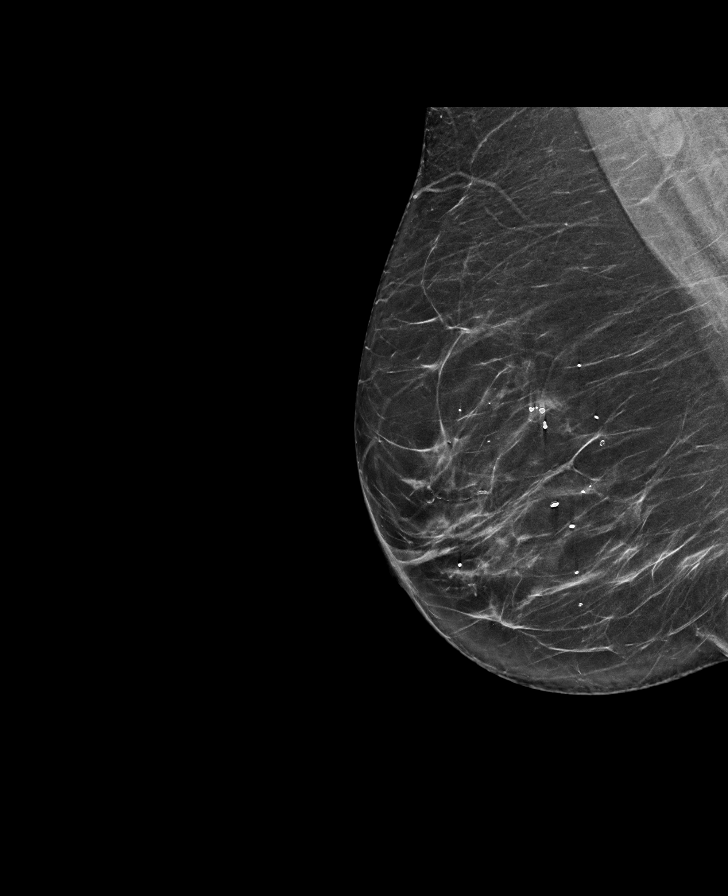

[L CC synth-2D]
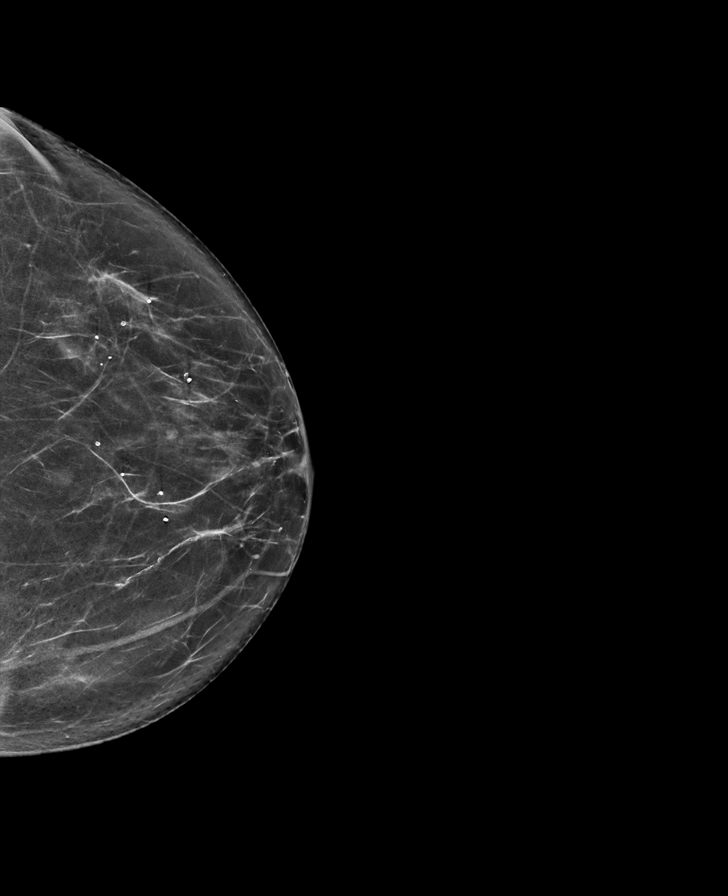

[R CC synth-2D]
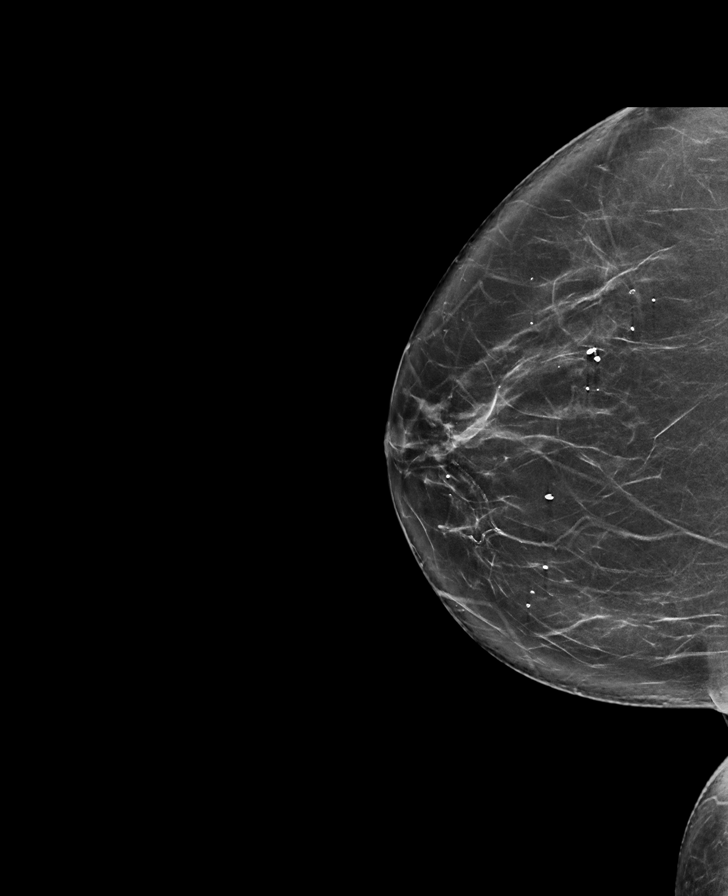

[R MLO tomo · tomo slice 39/78.0]
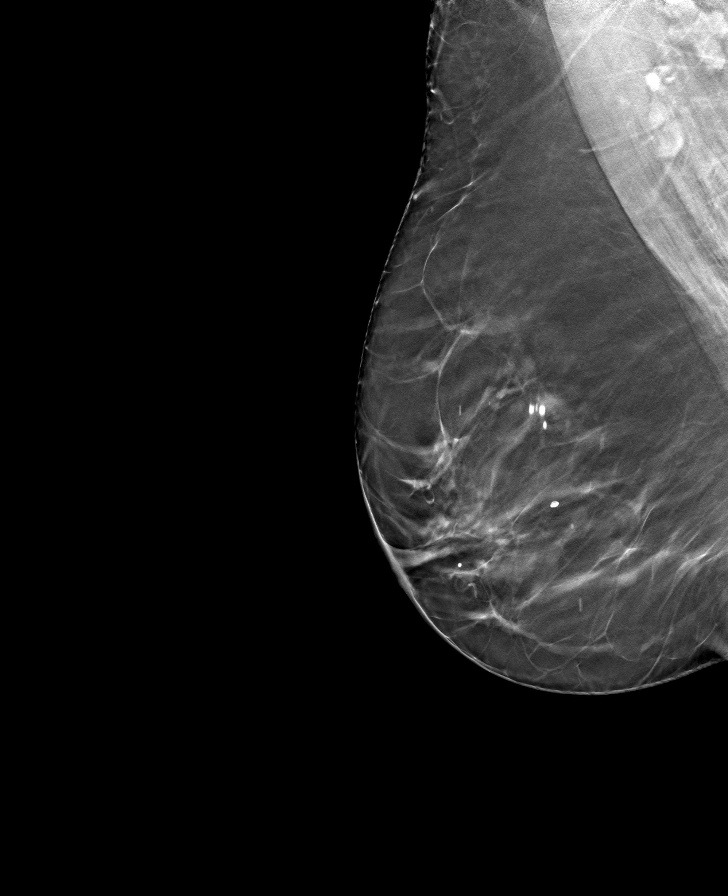

[L CC tomo · tomo slice 35/69.0]
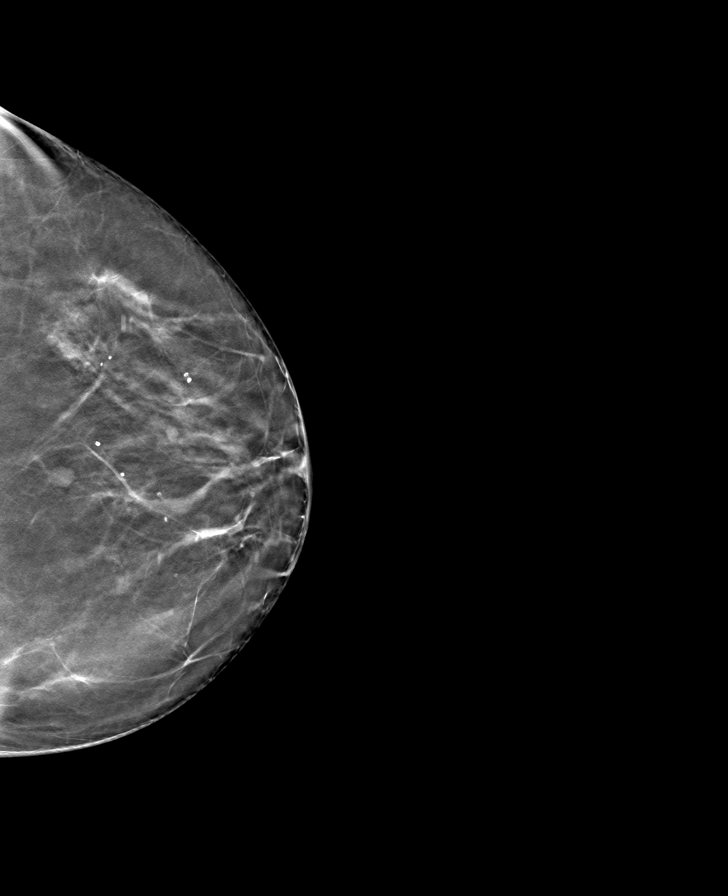

[L MLO tomo · tomo slice 41/80.0]
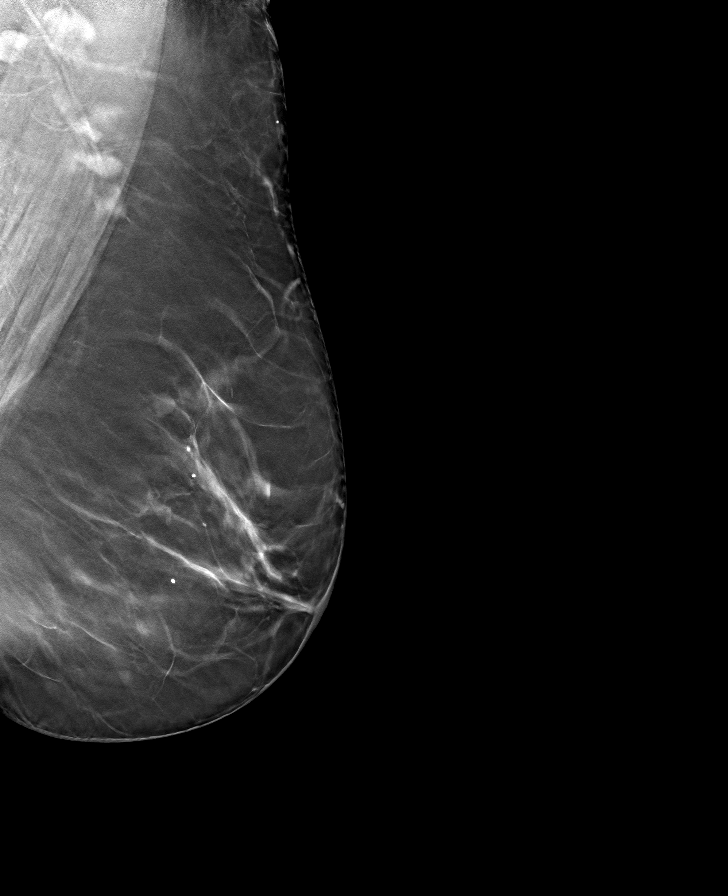

[R CC tomo · tomo slice 39/76.0]
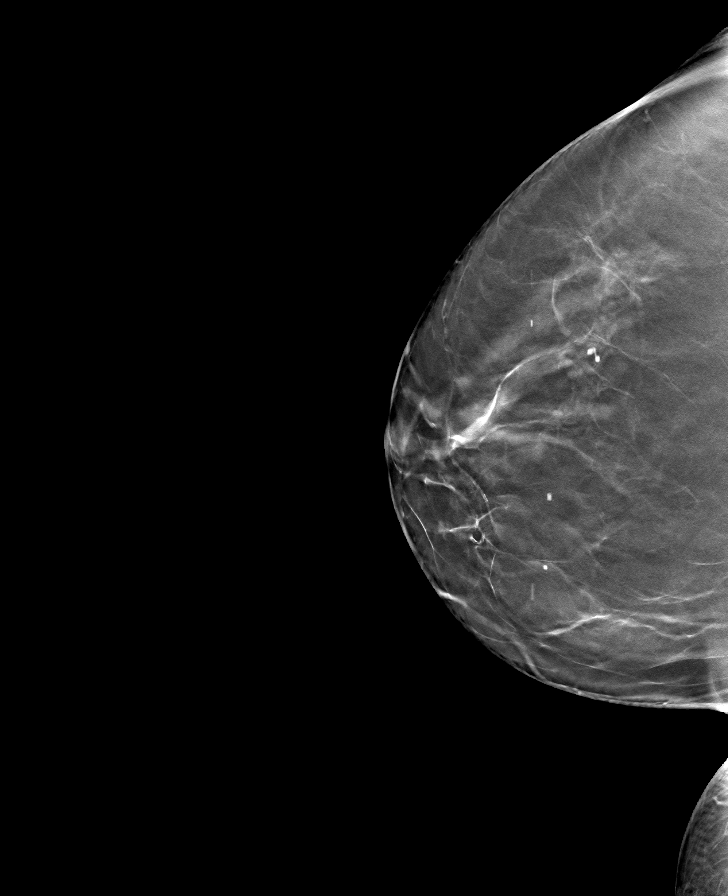

[8 of 24 positions shown; findings below may reference images not displayed]

ACR Breast Density Category b: There are scattered areas of
fibroglandular density.
FINDINGS: There are no findings suspicious for malignancy. Images were
processed with CAD.
IMPRESSION: No mammographic evidence of malignancy. A result letter of this
screening mammogram will be mailed directly to the patient.

RECOMMENDATION:
Screening mammogram in one year. (Code:[id number])

BI-RADS CATEGORY  1: Negative.

## 2020-02-13 ENCOUNTER — Ambulatory Visit: Payer: PPO | Attending: Internal Medicine

## 2020-02-13 ENCOUNTER — Encounter: Payer: Self-pay | Admitting: Family Medicine

## 2020-02-13 DIAGNOSIS — Z23 Encounter for immunization: Secondary | ICD-10-CM

## 2020-02-13 NOTE — Progress Notes (Signed)
   Covid-19 Vaccination Clinic  Name:  Daisy Nelson    MRN: [id number] DOB: October 22, 1946  02/13/2020  Daisy Nelson was observed post Covid-19 immunization for 15 minutes without incident. She was provided with Vaccine Information Sheet and instruction to access the V-Safe system.   Daisy Nelson was instructed to call 911 with any severe reactions post vaccine: Marland Kitchen Difficulty breathing  . Swelling of face and throat  . A fast heartbeat  . A bad rash all over body  . Dizziness and weakness   Immunizations Administered    Name Date Dose VIS Date Route   Pfizer COVID-19 Vaccine 02/13/2020  1:03 PM 0.3 mL 11/10/2019 Intramuscular   Manufacturer: [place]   Lot: [id number]   [place]: [id number]

## 2020-02-19 ENCOUNTER — Other Ambulatory Visit: Payer: Self-pay | Admitting: Family Medicine

## 2020-02-19 DIAGNOSIS — Z780 Asymptomatic menopausal state: Secondary | ICD-10-CM

## 2020-02-19 DIAGNOSIS — E559 Vitamin D deficiency, unspecified: Secondary | ICD-10-CM

## 2020-02-19 DIAGNOSIS — J309 Allergic rhinitis, unspecified: Secondary | ICD-10-CM

## 2020-03-11 ENCOUNTER — Other Ambulatory Visit: Payer: Self-pay

## 2020-03-11 ENCOUNTER — Other Ambulatory Visit: Payer: PPO

## 2020-03-11 DIAGNOSIS — R7303 Prediabetes: Secondary | ICD-10-CM | POA: Diagnosis not present

## 2020-03-12 ENCOUNTER — Other Ambulatory Visit: Payer: Self-pay | Admitting: Family Medicine

## 2020-03-12 DIAGNOSIS — E785 Hyperlipidemia, unspecified: Secondary | ICD-10-CM

## 2020-03-12 LAB — HEMOGLOBIN A1C
Est. average glucose Bld gHb Est-mCnc: 137 mg/dL
Hgb A1c MFr Bld: 6.4 % — ABNORMAL HIGH (ref 4.8–5.6)

## 2020-03-14 ENCOUNTER — Encounter: Payer: Self-pay | Admitting: Family Medicine

## 2020-03-14 ENCOUNTER — Ambulatory Visit (INDEPENDENT_AMBULATORY_CARE_PROVIDER_SITE_OTHER): Payer: PPO | Admitting: Family Medicine

## 2020-03-14 ENCOUNTER — Other Ambulatory Visit: Payer: Self-pay

## 2020-03-14 VITALS — BP 114/66 | HR 61 | Temp 98.1°F | Resp 12 | Ht 65.0 in | Wt 161.4 lb

## 2020-03-14 DIAGNOSIS — I4891 Unspecified atrial fibrillation: Secondary | ICD-10-CM | POA: Diagnosis not present

## 2020-03-14 DIAGNOSIS — F39 Unspecified mood [affective] disorder: Secondary | ICD-10-CM | POA: Diagnosis not present

## 2020-03-14 DIAGNOSIS — R7303 Prediabetes: Secondary | ICD-10-CM | POA: Diagnosis not present

## 2020-03-14 DIAGNOSIS — E785 Hyperlipidemia, unspecified: Secondary | ICD-10-CM | POA: Diagnosis not present

## 2020-03-14 DIAGNOSIS — E559 Vitamin D deficiency, unspecified: Secondary | ICD-10-CM

## 2020-03-14 NOTE — Progress Notes (Signed)
Impression and Recommendations:    1. Prediabetes   2. Atrial fibrillation with rapid ventricular response ([place])   3. Mood disorder ([place])   4. Hyperlipidemia, unspecified hyperlipidemia type   5. Vitamin D deficiency     Hyperlipidemia, unspecified hyperlipidemia type - Per patient, stable at this time. - Continues to tolerate medication well without S-E.  - Continue management as established.  See med list.  - Will continue to monitor and re-check as discussed.   Prediabetes - A1c three days ago at 6.4, unchanged from 6.4 prior.  - Counseled patient on prevention of diabetes and discussed dietary and lifestyle modification as first line.    - Importance of low carb, heart-healthy diet discussed with patient in addition to regular aerobic exercise of 31mn 5d/week or more.   - We will continue to monitor closely and re-check as discussed.   Mood disorder - Stable at this time, despite acute stress recently. - Continue treatment plan as established.  See med list.  - Reviewed the "spokes of the wheel" of mood and health management.  Stressed the importance of ongoing prudent habits, including regular exercise, appropriate sleep hygiene, healthful dietary habits, and prayer/meditation to relax.  - Will continue to monitor.   Vitamin D deficiency - Stable at this time. - Continue management as established.  See med list. - Will continue to monitor and re-check as discussed.   Health Counseling & Preventative Maintenance - Advised patient to continue working toward exercising to improve overall mental, physical, and emotional health.    - Encouraged patient to engage in daily physical activity as tolerated, especially a formal exercise routine.  Recommended that the patient eventually strive for at least 150 minutes of moderate cardiovascular activity per week according to guidelines [facility] the [facility]   - Healthy dietary habits encouraged, including  low-carb, and high amounts of lean protein in diet.   - Patient should also consume adequate amounts of water.  - Health counseling performed.  All questions answered.    Please see AVS handed out to patient at the end of our visit for further patient instructions/ counseling done pertaining to today's office visit.   Return for f/up in 3 months to closely monitor A1c.     Note:  This note was prepared with assistance of Dragon voice recognition software. Occasional wrong-word or sound-a-like substitutions may have occurred due to the inherent limitations of voice recognition software.   The [place] signed into law in 2016 which includes the topic of electronic health records.  This provides immediate access to information in MyChart.  This includes consultation notes, operative notes, office notes, lab results and pathology reports.  If you have any questions about what you read please let u[place]now at your next visit or call u[place]at the office.  We are right here with you.   This case required medical decision making of at least moderate complexity.  This document serves as a record of services personally performed by DMellody Dance DO. It was created on her behalf by KToni Amend a trained medical scribe. The creation of this record is based on the scribe's personal observations and the provider's statements to them.    The above documentation from KToni Amend medical scribe, has been reviewed by DMarjory Sneddon D.O.  --------------------------------------------------------------------------------------------------------------------------------------------------------------------------------------------------------------------------------------------    Subjective:     Daisy Nelson am serving as scribe for Dr.Abryana Lykens.   HPI: Daisy DALBYis  a 74 y.o. female who presents to [place] at [facility] today for  issues as discussed below.    - Mood & Stress Management Says for two and a half months, she had "activities happen that were out of my control," but notes "we're through with all of that."  It was stressful and sad.  States she was very tired when it finally all ended, but she has recuperated since then.  Says "I ate a lot of hospital food; eleven days of hospital food, and I wasn't the patient."  During that time, "the sleep had to come later."  She has since caught up on that.  Notes "I took myself away from the situation, had afternoons to myself, just kicked back and relaxed."  Feels her mood has stabilized now.  Does not have concerns about it at present.    - Atrial Fibrillation / PAF - stable, no complaints or sx    - Hydration Regarding water intake, she drinks "as much as I can get in."  Says "I try to drink all day long."   Not sure how much she drinks    - Prediabetes She continues to maintain eating habits to reduce carbohydrate intake and help control her prediabetes.    HPI:  Hypertension: -  Her blood pressure at home has been running: stable, well controlled at home, around 114/66 on a regular basis  -  On TOPROL per Cards for help with rate control..  - Patient reports good compliance with medication and/or lifestyle modification  - Her denies acute concerns or problems related to treatment plan  - She denies new onset of: chest pain, exercise intolerance, shortness of breath, dizziness, visual changes, headache, lower extremity swelling or claudication.   Last 3 blood pressure readings in our office are as follows: BP Readings from Last 3 Encounters:  03/14/20 114/66  12/28/19 (!) 96/57  12/21/19 98/60   Filed Weights   03/14/20 0939  Weight: 161 lb 6.4 oz (73.2 kg)      HPI:  Hyperlipidemia:  74 y.o. female here for cholesterol follow-up.   - Patient reports good compliance with treatment plan of: statin medication and/ or lifestyle  management.    - Patient denies any acute concerns or problems with management plan   - She denies new onset of: myalgias, arthralgias, increased fatigue more than normal, chest pains, exercise intolerance, shortness of breath, dizziness, visual changes, headache, lower extremity swelling or claudication.   Most recent cholesterol panel was:  Lab Results  Component Value Date   CHOL 129 06/12/2019   HDL 59 06/12/2019   LDLCALC 49 06/12/2019   TRIG 107 06/12/2019   CHOLHDL 2.2 06/12/2019   Hepatic Function Latest Ref Rng & Units 06/12/2019 03/22/2019 03/29/2018  Total Protein 6.0 - 8.5 g/dL 6.4 6.6 6.5  Albumin 3.7 - 4.7 g/dL 4.1 4.2 3.8  AST 0 - 40 IU/L _0 ALT 0 - 32 IU/L _1 Alk Phosphatase 39 - 117 IU/L 99 98 104  Total Bilirubin 0.0 - 1.2 mg/dL 0.4 0.4 0.3      HPI: Prediabetes: - Patient reports good compliance with therapy plan: medication and/or lifestyle modification  - She denies new concerns.  Denies polyuria/polydipsia, hypo/ hyperglycemia symptoms.  Denies new onset of: chest pain, exercise intolerance, shortness of breath, dizziness, visual changes, headache, lower extremity swelling or claudication.   Last A1C in the office was:  Lab Results  Component Value Date  HGBA1C 6.4 (H) 03/11/2020   HGBA1C 6.4 (A) 12/28/2019   HGBA1C 6.1 (A) 12/14/2019   Lab Results  Component Value Date   LDLCALC 49 06/12/2019   CREATININE 0.87 06/12/2019   BP Readings from Last 3 Encounters:  03/14/20 114/66  12/28/19 (!) 96/57  12/21/19 98/60   Wt Readings from Last 3 Encounters:  03/14/20 161 lb 6.4 oz (73.2 kg)  12/28/19 159 lb 12 oz (72.5 kg)  12/21/19 162 lb (73.5 kg)     Depression screen [facility] 2/9 03/14/2020 12/28/2019 12/14/2019  Decreased Interest 0 0 0  Down, Depressed, Hopeless 0 0 0  PHQ - 2 Score 0 0 0  Altered sleeping _0 Tired, decreased energy 0 0 0  Change in appetite 0 0 0  Feeling bad or failure about yourself  0 0 0  Trouble  concentrating 0 0 0  Moving slowly or fidgety/restless 0 0 0  Suicidal thoughts 0 0 0  PHQ-9 Score _1 Difficult doing work/chores Somewhat difficult Somewhat difficult Not difficult at all  Some recent data might be hidden    GAD 7 : Generalized Anxiety Score 06/12/2019 10/24/2018 06/23/2018  Nervous, Anxious, on Edge 0 0 0  Control/stop worrying 0 1 0  Worry too much - different things 0 0 0  Trouble relaxing 0 0 0  Restless 0 0 0  Easily annoyed or irritable 0 0 0  Afraid - awful might happen 0 0 0  Total GAD 7 Score 0 1 0     Wt Readings from Last 3 Encounters:  03/14/20 161 lb 6.4 oz (73.2 kg)  12/28/19 159 lb 12 oz (72.5 kg)  12/21/19 162 lb (73.5 kg)   BP Readings from Last 3 Encounters:  03/14/20 114/66  12/28/19 (!) 96/57  12/21/19 98/60   Pulse Readings from Last 3 Encounters:  03/14/20 61  12/28/19 (!) 55  12/21/19 (!) 55   BMI Readings from Last 3 Encounters:  03/14/20 26.86 kg/m  12/28/19 26.58 kg/m  12/21/19 26.96 kg/m     Patient Care Team    Relationship Specialty Notifications Start End  Mellody Dance, DO PCP - General Family Medicine  05/10/18   Pixie Casino, MD Consulting Physician Cardiology  11/14/13   [name], MD Consulting Physician Ophthalmology  07/18/16   Carol Ada, MD Consulting Physician Gastroenterology  09/22/16      Patient Active Problem List   Diagnosis Date Noted  . Prediabetes 03/26/2015  . Atrial fibrillation with rapid ventricular response ([place]) 02/06/2013  . Hyperlipidemia 10/25/2012  . hx tob abuse;  30 yrs at roughly 1/2 ppd 05/11/2018  . Drug-induced bradycardia-  rate controlled on metoprolol by cards, patient ASx 05/10/2018  . GERD (gastroesophageal reflux disease) 10/25/2012  . Anxiety 10/25/2012  . Family history of coronary artery bypass surgery 02/06/2013  . DJD (degenerative joint disease) of cervical spine 10/25/2012  . Colon polyps 10/25/2012  . Vitamin D deficiency 12/14/2019  .  Hypertension 06/12/2019  . Environmental and seasonal allergies 10/24/2018  . Mood disorder ([place]) 05/10/2018  . Preretinal fibrosis, right eye 08/25/2016  . PAF (paroxysmal atrial fibrillation) ([place]) 07/10/2013  . Allergic rhinitis 10/25/2012  . Nephrolithiasis 10/25/2012  . Complication of anesthesia 11/30/2009    Past Medical history, Surgical history, Family history, Social history, Allergies and Medications have been entered into the medical record, reviewed and changed as needed.    Current Meds  Medication Sig  . atorvastatin (LIPITOR) 40 MG  tablet TAKE 1 TABLET BY MOUTH AT BEDTIME(NNEDS OFFICE VISIT AND BLOOD WORK)  . cimetidine (TAGAMET) 200 MG tablet Take 200 mg by mouth daily.  . fexofenadine (ALLEGRA) 180 MG tablet Take 180 mg by mouth daily.  . flecainide (TAMBOCOR) 100 MG tablet Take 2 tablets by mouth with onset of atrial fib. No more than once a week  . fluticasone (FLONASE) 50 MCG/ACT nasal spray Use 2 spray(s) in each nostril once daily  . hydrOXYzine (ATARAX/VISTARIL) 50 MG tablet Take 1 tablet (50 mg total) by mouth at bedtime. Prn itch  . metoprolol tartrate (LOPRESSOR) 25 MG tablet Take 1/2 (one-half) tablet by mouth twice daily  . sertraline (ZOLOFT) 100 MG tablet Take 1 tablet by mouth once daily  . Vitamin D, Ergocalciferol, (DRISDOL) 1.25 MG (50000 UNIT) CAPS capsule Take 1 capsule by mouth once a week  . XARELTO 20 MG TABS tablet TAKE 1 TABLET BY MOUTH ONCE DAILY WITH SUPPER . APPOINTMENT REQUIRED FOR FUTURE REFILLS    Allergies:  Allergies  Allergen Reactions  . Adhesive [Tape]     Burn skin  . Erythromycin     Gi intolerance - nausea, vomiting, and abd pains  . Naproxen Nausea And Vomiting    Abdominal pain   . Penicillins Hives    Has patient had a PCN reaction causing immediate rash, facial/tongue/throat swelling, SOB or lightheadedness with hypotension: UNKNOWN Has patient had a PCN reaction causing severe rash involving mucus membranes or skin  necrosis: No Has patient had a PCN reaction that required hospitalization No Has patient had a PCN reaction occurring within the last 10 years: No If all of the above answers are "NO", then may proceed with Cephalosporin use.      Review of Systems:  A fourteen system review of systems was performed and found to be positive as per HPI.   Objective:   Blood pressure 114/66, pulse 61, temperature 98.1 F (36.7 C), temperature source Oral, resp. rate 12, height _0  (1.651 m), weight 161 lb 6.4 oz (73.2 kg), SpO2 97 %. Body mass index is 26.86 kg/m. General:  Well Developed, well nourished, appropriate for stated age.  Neuro:  Alert and oriented,  extra-ocular muscles intact  HEENT:  Normocephalic, atraumatic, neck supple, no carotid bruits appreciated  Skin:  no gross rash, warm, pink. Cardiac:  RRR, S1 S2 Respiratory:  ECTA B/L and A/P, Not using accessory muscles, speaking in full sentences- unlabored. Vascular:  Ext warm, no cyanosis apprec.; cap RF less 2 sec. Psych:  No HI/SI, judgement and insight good, Euthymic mood. Full Affect.

## 2020-03-14 NOTE — Patient Instructions (Signed)
Risk factors for prediabetes and type 2 diabetes ° °Researchers don't fully understand why some people develop prediabetes and type 2 diabetes and others don't.  It's clear that certain factors increase the risk, however, including: ° °Weight. The more fatty tissue you have, the more resistant your cells become to insulin.  °Inactivity. The less active you are, the greater your risk. Physical activity helps you control your weight, uses up glucose as energy and makes your cells more sensitive to insulin.  °Family history. Your risk increases if a parent or sibling has type 2 diabetes.  °Race. Although it's unclear why, people of certain races -- including blacks, Hispanics, American Indians and Asian-Americans -- are at higher risk.  °Age. Your risk increases as you get older. This may be because you tend to exercise less, lose muscle mass and gain weight as you age. But type 2 diabetes is also increasing dramatically among children, adolescents and younger adults.  °Gestational diabetes. If you developed gestational diabetes when you were pregnant, your risk of developing prediabetes and type 2 diabetes later increases. If you gave birth to a baby weighing more than 9 pounds (4 kilograms), you're also at risk of type 2 diabetes.  °Polycystic ovary syndrome. For women, having polycystic ovary syndrome -- a common condition characterized by irregular menstrual periods, excess hair growth and obesity -- increases the risk of diabetes.  °High blood pressure. Having blood pressure over 140/90 millimeters of mercury (mm Hg) is linked to an increased risk of type 2 diabetes.  °Abnormal cholesterol and triglyceride levels. If you have low levels of high-density lipoprotein (HDL), or "good," cholesterol, your risk of type 2 diabetes is higher. Triglycerides are another type of fat carried in the blood. People with high levels of triglycerides have an increased risk of type 2 diabetes. Your doctor can let you know what your  cholesterol and triglyceride levels are. ° °A good guide to good carbs: The glycemic index °---If you have diabetes, or at risk for diabetes, you know all too well that when you eat carbohydrates, your blood sugar goes up. The total amount of carbs you consume at a meal or in a snack mostly determines what your blood sugar will do. But the food itself also plays a role. A serving of white rice has almost the same effect as eating pure table sugar -- a quick, high spike in blood sugar. A serving of lentils has a slower, smaller effect. ° °---Picking good sources of carbs can help you control your blood sugar and your weight. Even if you don't have diabetes, eating healthier carbohydrate-rich foods can help ward off a host of chronic conditions, from heart disease to various cancers to, well, diabetes. ° °---One way to choose foods is with the glycemic index (GI). This tool measures how much a food boosts blood sugar.  The glycemic index rates the effect of a specific amount of a food on blood sugar compared with the same amount of pure glucose. A food with a glycemic index of 28 boosts blood sugar only 28% as much as pure glucose. One with a GI of 95 acts like pure glucose. ° ° ° °High glycemic foods result in a quick spike in insulin and blood sugar (also known as blood glucose).  Low glycemic foods have a slower, smaller effect- these are healthier for you.  ° °Using the glycemic index °Using the glycemic index is easy: choose foods in the low GI category instead of those in the high   GI category (see below), and go easy on those in between. °Low glycemic index (GI of 55 or less): Most fruits and vegetables, beans, minimally processed grains, pasta, low-fat dairy foods, and nuts.  °Moderate glycemic index (GI 56 to 69): White and sweet potatoes, corn, white rice, couscous, breakfast cereals such as Cream of Wheat and Mini Wheats.  °High glycemic index (GI of 70 or higher): White bread, rice cakes, most crackers,  bagels, cakes, doughnuts, croissants, most packaged breakfast cereals. °You can see the values for 100 commons foods and get links to more at www.health.[employer].edu/glycemic. ° °Swaps for lowering glycemic index  °Instead of this high-glycemic index food Eat this lower-glycemic index food  °White rice Brown rice or converted rice  °Instant oatmeal Steel-cut oats  °Cornflakes Bran flakes  °Baked potato Pasta, bulgur  °White bread Whole-grain bread  °Corn Peas or leafy greens  ° ° ° ° ° °Prediabetes Eating Plan ° °Prediabetes--also called impaired glucose tolerance or impaired fasting glucose--is a condition that causes blood sugar (blood glucose) levels to be higher than normal. Following a healthy diet can help to keep prediabetes under control. It can also help to lower the risk of type 2 diabetes and heart disease, which are increased in people who have prediabetes. Along with regular exercise, a healthy diet: °· Promotes weight loss. °· Helps to control blood sugar levels. °· Helps to improve the way that the body uses insulin. ° ° °WHAT DO I NEED TO KNOW ABOUT THIS EATING PLAN? ° °· Use the glycemic index (GI) to plan your meals. The index tells you how quickly a food will raise your blood sugar. Choose low-GI foods. These foods take a longer time to raise blood sugar. °· Pay close attention to the amount of carbohydrates in the food that you eat. Carbohydrates increase blood sugar levels. °· Keep track of how many calories you take in. Eating the right amount of calories will help you to achieve a healthy weight. Losing about 7 percent of your starting weight can help to prevent type 2 diabetes. °· You may want to follow a Mediterranean diet. This diet includes a lot of vegetables, lean meats or fish, whole grains, fruits, and healthy oils and fats. ° ° °WHAT FOODS CAN I EAT? ° °Grains °Whole grains, such as whole-wheat or whole-grain breads, crackers, cereals, and pasta. Unsweetened oatmeal. Bulgur. Barley.  Quinoa. Brown rice. Corn or whole-wheat flour tortillas or taco shells. °Vegetables °Lettuce. Spinach. Peas. Beets. Cauliflower. Cabbage. Broccoli. Carrots. Tomatoes. Squash. Eggplant. Herbs. Peppers. Onions. Cucumbers. Brussels sprouts. °Fruits °Berries. Bananas. Apples. Oranges. Grapes. Papaya. Mango. Pomegranate. Kiwi. Grapefruit. Cherries. °Meats and Other Protein Sources °Seafood. Lean meats, such as chicken and turkey or lean cuts of pork and beef. Tofu. Eggs. Nuts. Beans. °Dairy °Low-fat or fat-free dairy products, such as yogurt, cottage cheese, and cheese. °Beverages °Water. Tea. Coffee. Sugar-free or diet soda. Seltzer water. Milk. Milk alternatives, such as soy or almond milk. °Condiments °Mustard. Relish. Low-fat, low-sugar ketchup. Low-fat, low-sugar barbecue sauce. Low-fat or fat-free mayonnaise. °Sweets and Desserts °Sugar-free or low-fat pudding. Sugar-free or low-fat ice cream and other frozen treats. °Fats and Oils °Avocado. Walnuts. Olive oil. °The items listed above may not be a complete list of recommended foods or beverages. Contact your dietitian for more options.  ° ° °WHAT FOODS ARE NOT RECOMMENDED? ° °Grains °Refined white flour and flour products, such as bread, pasta, snack foods, and cereals. °Beverages °Sweetened drinks, such as sweet iced tea and soda. °Sweets and Desserts °  Baked goods, such as cake, cupcakes, pastries, cookies, and cheesecake. °The items listed above may not be a complete list of foods and beverages to avoid. Contact your dietitian for more information. °  °This information is not intended to replace advice given to you by your health care provider. Make sure you discuss any questions you have with your health care provider. °  °Document Released: 04/02/2015 Document Reviewed: 04/02/2015 °Elsevier Interactive Patient Education ©2016 [employer] ° ° ° °

## 2020-03-15 ENCOUNTER — Ambulatory Visit
Admission: RE | Admit: 2020-03-15 | Discharge: 2020-03-15 | Disposition: A | Discharge status: Home / Self Care | Payer: PPO | Source: Ambulatory Visit | Attending: Family Medicine | Admitting: Family Medicine

## 2020-03-15 DIAGNOSIS — Z780 Asymptomatic menopausal state: Secondary | ICD-10-CM

## 2020-03-15 DIAGNOSIS — E2839 Other primary ovarian failure: Secondary | ICD-10-CM

## 2020-04-02 ENCOUNTER — Other Ambulatory Visit: Payer: Self-pay | Admitting: Family Medicine

## 2020-04-02 DIAGNOSIS — F419 Anxiety disorder, unspecified: Secondary | ICD-10-CM

## 2020-04-08 ENCOUNTER — Other Ambulatory Visit: Payer: Self-pay | Admitting: Internal Medicine

## 2020-04-08 ENCOUNTER — Other Ambulatory Visit: Payer: Self-pay | Admitting: Family Medicine

## 2020-04-08 DIAGNOSIS — I480 Paroxysmal atrial fibrillation: Secondary | ICD-10-CM

## 2020-04-08 DIAGNOSIS — J0191 Acute recurrent sinusitis, unspecified: Secondary | ICD-10-CM

## 2020-04-11 ENCOUNTER — Encounter: Payer: Self-pay | Admitting: Physician Assistant

## 2020-04-11 ENCOUNTER — Other Ambulatory Visit: Payer: Self-pay | Admitting: Physician Assistant

## 2020-04-11 NOTE — Telephone Encounter (Signed)
Patient prescribed Montelukast in the past from Dr. Augusto Gamble for seasonal allergies (per patient). Medication has not been prescribed since 07/2018. Patient is requesting refill at this time.    Please approve if deemed appropriate.   AS, CMA    *Patient last seen 03/14/2020 by Raliegh Scarlet

## 2020-04-12 MED ORDER — MONTELUKAST SODIUM 10 MG PO TABS
10.0000 mg | ORAL_TABLET | Freq: Every day | ORAL | 0 refills | Status: DC
Start: 2020-04-12 — End: 2020-07-10

## 2020-04-22 ENCOUNTER — Other Ambulatory Visit: Payer: Self-pay | Admitting: Internal Medicine

## 2020-04-22 NOTE — Telephone Encounter (Signed)
Rx request sent to pharmacy.  

## 2020-04-25 ENCOUNTER — Ambulatory Visit: Payer: PPO | Admitting: Family Medicine

## 2020-05-20 ENCOUNTER — Other Ambulatory Visit: Payer: Self-pay | Admitting: Family Medicine

## 2020-05-20 DIAGNOSIS — Z780 Asymptomatic menopausal state: Secondary | ICD-10-CM

## 2020-05-20 DIAGNOSIS — E559 Vitamin D deficiency, unspecified: Secondary | ICD-10-CM

## 2020-05-24 ENCOUNTER — Encounter: Payer: Self-pay | Admitting: Physician Assistant

## 2020-05-24 DIAGNOSIS — E559 Vitamin D deficiency, unspecified: Secondary | ICD-10-CM

## 2020-05-24 DIAGNOSIS — Z780 Asymptomatic menopausal state: Secondary | ICD-10-CM

## 2020-05-24 MED ORDER — VITAMIN D (ERGOCALCIFEROL) 1.25 MG (50000 UNIT) PO CAPS: ORAL_CAPSULE | ORAL | 0 refills | Status: DC

## 2020-06-07 ENCOUNTER — Other Ambulatory Visit: Payer: Self-pay | Admitting: Physician Assistant

## 2020-06-07 DIAGNOSIS — Z1231 Encounter for screening mammogram for malignant neoplasm of breast: Secondary | ICD-10-CM

## 2020-06-08 ENCOUNTER — Other Ambulatory Visit: Payer: Self-pay | Admitting: Family Medicine

## 2020-06-08 DIAGNOSIS — J309 Allergic rhinitis, unspecified: Secondary | ICD-10-CM

## 2020-06-09 ENCOUNTER — Other Ambulatory Visit: Payer: Self-pay | Admitting: Family Medicine

## 2020-06-09 DIAGNOSIS — E785 Hyperlipidemia, unspecified: Secondary | ICD-10-CM

## 2020-06-13 ENCOUNTER — Encounter: Payer: Self-pay | Admitting: Physician Assistant

## 2020-06-13 ENCOUNTER — Other Ambulatory Visit: Payer: Self-pay

## 2020-06-13 ENCOUNTER — Ambulatory Visit (INDEPENDENT_AMBULATORY_CARE_PROVIDER_SITE_OTHER): Payer: PPO | Admitting: Physician Assistant

## 2020-06-13 VITALS — BP 119/68 | HR 51 | Temp 98.0°F | Ht 65.0 in | Wt 164.8 lb

## 2020-06-13 DIAGNOSIS — J309 Allergic rhinitis, unspecified: Secondary | ICD-10-CM | POA: Diagnosis not present

## 2020-06-13 DIAGNOSIS — R7303 Prediabetes: Secondary | ICD-10-CM | POA: Diagnosis not present

## 2020-06-13 DIAGNOSIS — E785 Hyperlipidemia, unspecified: Secondary | ICD-10-CM

## 2020-06-13 LAB — POCT GLYCOSYLATED HEMOGLOBIN (HGB A1C): Hemoglobin A1C: 6.5 % — AB (ref 4.0–5.6)

## 2020-06-13 MED ORDER — ROSUVASTATIN CALCIUM 20 MG PO TABS: 20.0000 mg | ORAL_TABLET | Freq: Every day | ORAL | 1 refills | Status: DC

## 2020-06-13 MED ORDER — FLUTICASONE PROPIONATE 50 MCG/ACT NA SUSP: NASAL | 0 refills | Status: DC

## 2020-06-13 MED ORDER — ATORVASTATIN CALCIUM 40 MG PO TABS: ORAL_TABLET | ORAL | 0 refills | Status: DC

## 2020-06-13 NOTE — Progress Notes (Signed)
Established Patient Office Visit  Subjective:  Patient ID: Daisy Nelson, female    DOB: July 03, 1946  Age: 74 y.o. MRN: [id number]  CC:  Chief Complaint  Patient presents with  . Hyperglycemia    HPI Daisy Nelson presents for follow-up on prediabetes and hyperlipidemia. Requesting refill of Flonase for allergies.  Prediabetes: Denies increased thirst or urination. Reports she has made dietary changes and reduced carbohydrates and sweets. She stays pretty active.  HLD: Pt taking medication as directed. She does report an odd sensation of RUQ for quite some time. Denies GI symptoms.  Past Medical History:  Diagnosis Date  . AF (atrial fibrillation) (Daisy Nelson)    AF with RVR 01/2013  . Allergy   . Anxiety   . Atrial fibrillation with RVR (Daisy Nelson) 08/31/2015  . Colon polyps   . Complication of anesthesia 2011   colonscopy- "slow to awaken"  . Depression   . DJD (degenerative joint disease) of cervical spine   . Family history of CABG   . GERD (gastroesophageal reflux disease)   . History of kidney stones   . Hyperglycemia   . Hyperlipidemia   . Jaw pain 08/31/2015  . Pneumonia   . PONV (postoperative nausea and vomiting)     Past Surgical History:  Procedure Laterality Date  . [place] VITRECTOMY WITH 20 GAUGE MVR PORT FOR MACULAR HOLE Right 08/25/2016   Procedure: 25 GAUGE PARS PLANA VITRECTOMY; RIGHT EYE;  Surgeon: [name], MD;  Location: [place];  Service: Ophthalmology;  Laterality: Right;  . ABDOMINAL HYSTERECTOMY  1991  . CATARACT EXTRACTION W/ INTRAOCULAR LENS  IMPLANT, BILATERAL Bilateral 03/2013  . CESAREAN SECTION  1976  . COLONOSCOPY  2011   polyps  . EYE SURGERY    . GANGLION CYST EXCISION Left    hand  . GAS/FLUID EXCHANGE Right 08/25/2016   Procedure: GAS/FLUID EXCHANGE RIGHT EYE;  Surgeon: [name], MD;  Location: [place];  Service: Ophthalmology;  Laterality: Right;  . IRIDECTOMY Right 08/25/2016   Procedure: PERIPHERAL IRIDECTOMY  RIGHT EYE;  Surgeon: [name], MD;  Location: [place] [facility];  Service: Ophthalmology;  Laterality: Right;  . LAPAROSCOPY  1976  . MEMBRANE PEEL Right 08/25/2016   Procedure: MEMBRANE PEEL RIGHT EYE;  Surgeon: [name], MD;  Location: [place];  Service: Ophthalmology;  Laterality: Right;  . NM MYOCAR PERF WALL MOTION  01/2013   lexiscan - no pharmacologically induced ischemia, fixed defects in apical segments of anterior septum & inferior lateral wall   . PARS PLANA VITRECTOMY Right 08/25/2016   laser, gas injection, AC wash out. Membrane peel right eye  . TRANSTHORACIC ECHOCARDIOGRAM  01/2013   EF 55-60%, mod conc LVH; mild MR with mildly thickened leaflets; mild TR; PA peak pressure 67mmHg  . TUBAL LIGATION  1977    Family History  Problem Relation Age of Onset  . Stroke Mother   . Diabetes Father   . Heart disease Father        CABG  . Arthritis Father        C6-7  . Skin cancer Father        SCC  . Arthritis Brother        s/p THR    Social History   Socioeconomic History  . Marital status: Married    Spouse name: Daisy Nelson  . Number of children: 1  . Years of education: 1  . Highest education level: Not on file  Occupational History  . Occupation: Medical illustrator  Tobacco Use  . Smoking status: Former Smoker    Packs/day: 0.50    Years: 36.00    Pack years: 18.00    Types: Cigarettes    Start date: 03/15/1963    Quit date: 01/08/2005    Years since quitting: 15.5  . Smokeless tobacco: Never Used  Vaping Use  . Vaping Use: Never used  Substance and Sexual Activity  . Alcohol use: No  . Drug use: No  . Sexual activity: Yes    Partners: Male    Birth control/protection: Surgical  Other Topics Concern  . Not on file  Social History Narrative   Lives with her husband and their cat, Daisy Nelson.   Social Determinants of Health   Financial Resource Strain:   . Difficulty of Paying Living Expenses:   Food Insecurity:   . Worried About Charity fundraiser in the  Last Year:   . Arboriculturist in the Last Year:   Transportation Needs:   . Film/video editor (Medical):   Daisy Nelson Lack of Transportation (Non-Medical):   Physical Activity:   . Days of Exercise per Week:   . Minutes of Exercise per Session:   Stress:   . Feeling of Stress :   Social Connections:   . Frequency of Communication with Friends and Family:   . Frequency of Social Gatherings with Friends and Family:   . Attends Religious Services:   . Active Member of Clubs or Organizations:   . Attends Archivist Meetings:   Daisy Nelson Marital Status:   Intimate Partner Violence:   . Fear of Current or Ex-Partner:   . Emotionally Abused:   Daisy Nelson Physically Abused:   . Sexually Abused:     Outpatient Medications Prior to Visit  Medication Sig Dispense Refill  . cimetidine (TAGAMET) 200 MG tablet Take 200 mg by mouth daily.    . fexofenadine (ALLEGRA) 180 MG tablet Take 180 mg by mouth daily.    . flecainide (TAMBOCOR) 100 MG tablet TAKE TWO TABLETS BY MOUTH WITH ONSET OF ARTIAL FIB. NO MORE THAN ONCE PER WEEK 6 tablet 5  . hydrOXYzine (ATARAX/VISTARIL) 50 MG tablet Take 1 tablet (50 mg total) by mouth at bedtime. Prn itch 30 tablet 0  . metoprolol tartrate (LOPRESSOR) 25 MG tablet Take 1/2 (one-half) tablet by mouth twice daily 90 tablet 3  . montelukast (SINGULAIR) 10 MG tablet Take 1 tablet (10 mg total) by mouth at bedtime. 90 tablet 0  . nystatin-triamcinolone ointment (MYCOLOG) Applied to rash twice daily until resolved (Patient taking differently: Apply 1 application topically. Applied to rash twice daily PRN) 60 g 0  . rivaroxaban (XARELTO) 20 MG TABS tablet Take 1 tablet (20 mg total) by mouth daily with supper. Please keep your upcoming appointment for refills. 90 tablet 0  . Vitamin D, Ergocalciferol, (DRISDOL) 1.25 MG (50000 UNIT) CAPS capsule Take 1 capsule by mouth once a week 12 capsule 0  . atorvastatin (LIPITOR) 40 MG tablet TAKE 1 TABLET BY MOUTH AT BEDTIME(NNEDS OFFICE  VISIT AND BLOOD WORK) 90 tablet 0  . fluticasone (FLONASE) 50 MCG/ACT nasal spray Use 2 spray(s) in each nostril once daily 48 g 0  . sertraline (ZOLOFT) 100 MG tablet Take 1 tablet by mouth once daily 90 tablet 0  . fluconazole (DIFLUCAN) 150 MG tablet Repeat wkly * 4 4 tablet 0   No facility-administered medications prior to visit.    Allergies  Allergen Reactions  . Adhesive [Tape]     Burn skin  . Erythromycin     Gi intolerance - nausea, vomiting, and abd pains  . Naproxen Nausea And Vomiting    Abdominal pain   . Penicillins Hives    Has patient had a PCN reaction causing immediate rash, facial/tongue/throat swelling, SOB or lightheadedness with hypotension: UNKNOWN Has patient had a PCN reaction causing severe rash involving mucus membranes or skin necrosis: No Has patient had a PCN reaction that required hospitalization No Has patient had a PCN reaction occurring within the last 10 years: No If all of the above answers are "NO", then may proceed with Cephalosporin use.     ROS Review of Systems Review of Systems:  A fourteen system review of systems was performed and found to be positive as per HPI.  Objective:    Physical Exam General: Well nourished, in no apparent distress. Eyes: PERRLA, EOMs, conjunctiva clr Resp: Respiratory effort- normal, ECTA B/L w/o W/R/R  Cardio: RRR w/o MRGs. Abdomen: no gross distention. No TTP. Lymphatics:  less 2 sec cap RF M-sk: Full ROM, good strength, normal gait.  Skin: Warm, dry  Neuro: Alert, Oriented Psych: Normal affect, Insight and Judgment appropriate.  BP 119/68   Pulse (!) 51   Temp 98 F (36.7 C) (Oral)   Ht 5\' 5"  (1.651 m)   Wt 164 lb 12.8 oz (74.8 kg)   SpO2 97% Comment: on RA  BMI 27.42 kg/m  Wt Readings from Last 3 Encounters:  06/13/20 164 lb 12.8 oz (74.8 kg)  03/14/20 161 lb 6.4 oz (73.2 kg)  12/28/19 159 lb 12 oz (72.5 kg)     Health Maintenance Due  Topic Date Due  . TETANUS/TDAP  08/17/2018  .  INFLUENZA VACCINE  06/30/2020    There are no preventive care reminders to display for this patient.  Lab Results  Component Value Date   TSH 2.070 06/19/2020   Lab Results  Component Value Date   WBC 5.6 06/19/2020   HGB 13.4 06/19/2020   HCT 39.0 06/19/2020   MCV 87 06/19/2020   PLT 166 06/19/2020   Lab Results  Component Value Date   NA 138 06/19/2020   K 4.5 06/19/2020   CO2 22 06/19/2020   GLUCOSE 118 (H) 06/19/2020   BUN 13 06/19/2020   CREATININE 0.95 06/19/2020   BILITOT 0.4 06/19/2020   ALKPHOS 100 06/19/2020   AST 20 06/19/2020   ALT 19 06/19/2020   PROT 6.6 06/19/2020   ALBUMIN 4.0 06/19/2020   CALCIUM 9.4 06/19/2020   ANIONGAP 7 08/25/2016   Lab Results  Component Value Date   CHOL 121 06/19/2020   Lab Results  Component Value Date   HDL 55 06/19/2020   Lab Results  Component Value Date   LDLCALC 49 06/19/2020   Lab Results  Component Value Date   TRIG 90 06/19/2020   Lab Results  Component Value Date   CHOLHDL 2.2 06/19/2020   Lab Results  Component Value Date   HGBA1C 6.5 (A) 06/13/2020      Assessment & Plan:   Problem List Items Addressed This Visit      Respiratory   Allergic rhinitis   Relevant Medications   fluticasone (FLONASE) 50 MCG/ACT nasal spray     Other   Hyperlipidemia (Chronic)   Relevant Medications   rosuvastatin (CRESTOR) 20 MG tablet   Prediabetes - Primary   Relevant Orders   POCT glycosylated hemoglobin (Hb A1C) (Completed)  Prediabetes: -A1c today 6.5, stable -Recommend to continue with diabetic diet and physical activity. -Will continue to monitor.  HLD: - Last lipid panel wnl's, LDL 49 - Discussed with patient changing statin to see if RUQ symptoms improve. Pt agreeable to discontinue atorvastatin and start rosuvastatin.  - Follow heart healthy diet.  - Follow-up in 1-2 weeks for fasting blood work to recheck lipid panel and hepatic function.  Allergic rhinitis: -Stable -Continue  current medication regimen. Provided refills.  Meds ordered this encounter  Medications  . DISCONTD: atorvastatin (LIPITOR) 40 MG tablet    Sig: TAKE 1 TABLET BY MOUTH AT BEDTIME(NNEDS OFFICE VISIT AND BLOOD WORK)    Dispense:  90 tablet    Refill:  0  . fluticasone (FLONASE) 50 MCG/ACT nasal spray    Sig: Use 2 spray(s) in each nostril once daily    Dispense:  48 g    Refill:  0  . rosuvastatin (CRESTOR) 20 MG tablet    Sig: Take 1 tablet (20 mg total) by mouth daily.    Dispense:  90 tablet    Refill:  1    Order Specific Question:   Supervising Provider    Answer:   Beatrice Lecher D [[id number]]    Follow-up: Return in about 3 months (around 09/13/2020) for Pre-DM, HLD,HTN, Mood; lab visit for FBW (not A1c, done today) in 1-2 weeks.    Lorrene Reid, PA-C

## 2020-06-13 NOTE — Patient Instructions (Signed)

## 2020-06-17 ENCOUNTER — Other Ambulatory Visit: Payer: Self-pay | Admitting: Physician Assistant

## 2020-06-17 DIAGNOSIS — R7303 Prediabetes: Secondary | ICD-10-CM

## 2020-06-17 DIAGNOSIS — E785 Hyperlipidemia, unspecified: Secondary | ICD-10-CM

## 2020-06-17 DIAGNOSIS — I10 Essential (primary) hypertension: Secondary | ICD-10-CM

## 2020-06-18 ENCOUNTER — Telehealth: Payer: Self-pay

## 2020-06-18 NOTE — Telephone Encounter (Signed)
Left a voicemail to call. [facility] hospital date available for 08-19-2020 for a colonoscopy with Dr Loletha Carrow

## 2020-06-19 ENCOUNTER — Other Ambulatory Visit: Payer: Self-pay

## 2020-06-19 ENCOUNTER — Other Ambulatory Visit: Payer: PPO

## 2020-06-19 DIAGNOSIS — I10 Essential (primary) hypertension: Secondary | ICD-10-CM | POA: Diagnosis not present

## 2020-06-19 DIAGNOSIS — R7303 Prediabetes: Secondary | ICD-10-CM | POA: Diagnosis not present

## 2020-06-19 DIAGNOSIS — E785 Hyperlipidemia, unspecified: Secondary | ICD-10-CM

## 2020-06-20 ENCOUNTER — Other Ambulatory Visit: Payer: Self-pay

## 2020-06-20 DIAGNOSIS — Z7901 Long term (current) use of anticoagulants: Secondary | ICD-10-CM

## 2020-06-20 DIAGNOSIS — Z86010 Personal history of colonic polyps: Secondary | ICD-10-CM

## 2020-06-20 LAB — LIPID PANEL
Chol/HDL Ratio: 2.2 ratio (ref 0.0–4.4)
Cholesterol, Total: 121 mg/dL (ref 100–199)
HDL: 55 mg/dL (ref >39)
LDL Chol Calc (NIH): 49 mg/dL (ref 0–99)
Triglycerides: 90 mg/dL (ref 0–149)
VLDL Cholesterol Cal: 17 mg/dL (ref 5–40)

## 2020-06-20 LAB — CBC
Hematocrit: 39 % (ref 34.0–46.6)
Hemoglobin: 13.4 g/dL (ref 11.1–15.9)
MCH: 29.7 pg (ref 26.6–33.0)
MCHC: 34.4 g/dL (ref 31.5–35.7)
MCV: 87 fL (ref 79–97)
Platelets: 166 10*3/uL (ref 150–450)
RBC: 4.51 x10E6/uL (ref 3.77–5.28)
RDW: 13 % (ref 11.7–15.4)
WBC: 5.6 10*3/uL (ref 3.4–10.8)

## 2020-06-20 LAB — COMPREHENSIVE METABOLIC PANEL
ALT: 19 IU/L (ref 0–32)
AST: 20 IU/L (ref 0–40)
Albumin/Globulin Ratio: 1.5 (ref 1.2–2.2)
Albumin: 4 g/dL (ref 3.7–4.7)
Alkaline Phosphatase: 100 IU/L (ref 48–121)
BUN/Creatinine Ratio: 14 (ref 12–28)
BUN: 13 mg/dL (ref 8–27)
Bilirubin Total: 0.4 mg/dL (ref 0.0–1.2)
CO2: 22 mmol/L (ref 20–29)
Calcium: 9.4 mg/dL (ref 8.7–10.3)
Chloride: 102 mmol/L (ref 96–106)
Creatinine, Ser: 0.95 mg/dL (ref 0.57–1.00)
GFR calc Af Amer: 69 mL/min/{1.73_m2} (ref >59)
GFR calc non Af Amer: 60 mL/min/{1.73_m2} (ref >59)
Globulin, Total: 2.6 g/dL (ref 1.5–4.5)
Glucose: 118 mg/dL — ABNORMAL HIGH (ref 65–99)
Potassium: 4.5 mmol/L (ref 3.5–5.2)
Sodium: 138 mmol/L (ref 134–144)
Total Protein: 6.6 g/dL (ref 6.0–8.5)

## 2020-06-20 LAB — TSH: TSH: 2.07 u[IU]/mL (ref 0.450–4.500)

## 2020-06-20 NOTE — Telephone Encounter (Signed)
Patient has been scheduled for 08-19-2020. Instructions have been sent in the mail and mychart

## 2020-06-30 ENCOUNTER — Encounter: Payer: Self-pay | Admitting: Physician Assistant

## 2020-06-30 ENCOUNTER — Other Ambulatory Visit: Payer: Self-pay | Admitting: Physician Assistant

## 2020-06-30 DIAGNOSIS — F419 Anxiety disorder, unspecified: Secondary | ICD-10-CM

## 2020-07-01 ENCOUNTER — Other Ambulatory Visit: Payer: Self-pay | Admitting: Physician Assistant

## 2020-07-01 DIAGNOSIS — F419 Anxiety disorder, unspecified: Secondary | ICD-10-CM

## 2020-07-01 MED ORDER — SERTRALINE HCL 100 MG PO TABS: 100.0000 mg | ORAL_TABLET | Freq: Every day | ORAL | 0 refills | Status: DC

## 2020-07-10 ENCOUNTER — Encounter: Payer: Self-pay | Admitting: Physician Assistant

## 2020-07-10 ENCOUNTER — Other Ambulatory Visit: Payer: Self-pay | Admitting: Physician Assistant

## 2020-07-18 ENCOUNTER — Other Ambulatory Visit: Payer: Self-pay

## 2020-07-18 ENCOUNTER — Encounter: Payer: Self-pay | Admitting: Internal Medicine

## 2020-07-18 ENCOUNTER — Ambulatory Visit: Payer: PPO | Admitting: Internal Medicine

## 2020-07-18 VITALS — BP 112/66 | HR 55 | Ht 65.0 in | Wt 164.0 lb

## 2020-07-18 DIAGNOSIS — I480 Paroxysmal atrial fibrillation: Secondary | ICD-10-CM | POA: Diagnosis not present

## 2020-07-18 DIAGNOSIS — E785 Hyperlipidemia, unspecified: Secondary | ICD-10-CM

## 2020-07-18 MED ORDER — METOPROLOL TARTRATE 25 MG PO TABS: ORAL_TABLET | ORAL | 3 refills | Status: DC

## 2020-07-18 MED ORDER — RIVAROXABAN 20 MG PO TABS: 20.0000 mg | ORAL_TABLET | Freq: Every day | ORAL | 1 refills | Status: DC

## 2020-07-18 MED ORDER — ROSUVASTATIN CALCIUM 20 MG PO TABS: 20.0000 mg | ORAL_TABLET | Freq: Every day | ORAL | 3 refills | Status: DC

## 2020-07-18 MED ORDER — FLECAINIDE ACETATE 50 MG PO TABS: 50.0000 mg | ORAL_TABLET | Freq: Two times a day (BID) | ORAL | 3 refills | Status: DC

## 2020-07-18 NOTE — Patient Instructions (Signed)
Medication Instructions:  START flecainide 50mg  twice daily  *If you need a refill on your cardiac medications before your next appointment, please call your pharmacy*   Testing/Procedures: EKG check with nurse in 1-2 weeks   Follow-Up: At [facility] [place], you and your health needs are our priority.  As part of our continuing mission to provide you with exceptional heart care, we have created designated Provider Care Teams.  These Care Teams include your primary Cardiologist (physician) and Advanced Practice Providers (APPs -  Physician Assistants and Nurse Practitioners) who all work together to provide you with the care you need, when you need it.  We recommend signing up for the patient portal called "MyChart".  Sign up information is provided on this After Visit Summary.  MyChart is used to connect with patients for Virtual Visits (Telemedicine).  Patients are able to view lab/test results, encounter notes, upcoming appointments, etc.  Non-urgent messages can be sent to your provider as well.   To learn more about what you can do with MyChart, go to [email or web address].    Your next appointment:   3 month(s)  The format for your next appointment:   In Person  Provider:   You may see Dr. Debara Pickett or one of the following Advanced Practice Providers on your designated Care Team:    Almyra Deforest, PA-C  Fabian Sharp, [place] or   Roby Lofts, [place]    Other Instructions

## 2020-07-18 NOTE — Progress Notes (Signed)
OFFICE NOTE  Chief Complaint:  No complaints  Primary Care Physician: Lorrene Reid, PA-C  ID:  STARLIT RABURN, DOB 08-24-1946, MRN [id number]  PCP:  Lorrene Reid, PA-C  Primary Cardiologist:  Hadasa Gasner     History of Present Illness: Daisy Nelson is a 74 y.o. female no prior history of CAD or AF, presented to the ER 3/10 around 3am with complaints of jaw pain, palpitations, and SOB. Her symptoms woke her from sleep around 12:30am. In the ER she was noted to be in Rapid AF. Her EKG shows diffuse ST depression. Initial Troponin is negative. Her symptoms improved after Diltiazem IV was started. She denied any prior history of similar symptoms in the past. She did take Mucinex D earlier this week for sinus infection.   The patient was admitted to [facility] and started on diltiazem, heparin, ASA, beta blocker and nitrates. She spontaneously converted to NSR and ruled out for MI. Lexiscan myovew revealed normal LVF, no ischemia and fixed defects involving the apical segments of the inferior septum and inferior lateral walls. 2D echo confirmed normal EF of 55-60%. Mild TR. CHADSVSC= 1. She was started on Xarelto.  Lopressor was continued.   She was last seen by Tarri Fuller, PA-C, as she was about to have cataract surgery.  It went very well for her and she can now see clearly.  She reports only an accessional fluttering in her chest.  I did decrease her lopressor to 12.5 mg bid during the last visit because she was having a HR in the 40's.  She otherwise denies  nausea, vomiting, fever, chest pain, shortness of breath, orthopnea, dizziness, PND, cough, congestion, abdominal pain, hematochezia, melena, lower extremity edema.  [name] back in the office today. She is currently without complaints. She is active denies any chest pain or shortness of breath. She very rarely gets palpitations the last only for a few seconds. She denies any recurrent atrial fibrillation and her EKG today sinus  rhythm. She's had no bleeding problems on aspirin. She had laboratory work in October which shows a well controlled lipid profile on pravastatin. Weight is stable  I saw Ms. Louis back today in the office. Overall she is doing very well. She denies any chest pain or shortness of breath. She's had no recurrent A. Fib episodes. She has not needed to take her flecainide as needed. She continues on Xarelto without any bleeding problems. Blood pressure is well-controlled today.  01/18/2017  [name] returns today for follow-up. She reports no significant recurrent atrial fibrillation. EKG today shows sinus bradycardia with nonspecific ST changes. She is tolerating Xarelto without any bleeding problems. She has not needed the flecainide pocket pill strategy, but is due for a refill. QTC is 395 ms.  01/11/2018  [name] was seen today in follow-up.  Overall she reports doing well.  She denies any significant recurrent atrial fibrillation.  She occasionally feels palpitations.  She does not do active heart rate monitoring.  We discussed the AliveCor application as well as the newest iteration of the apple watch, both of which can monitor heart rhythms.  He is tolerating Xarelto without bleeding problems.  She has not required flecainide.  She may need a new prescription as a drug could be older and she should check back with [place] when she looks at her bottle.  06/29/2019  Ms. [name] is seen today in follow-up.  Overall she continues to do well.  She says last summer she had one  episode of what she thought was A. fib and took 2 flecainide at night and went to sleep.  She says she woke up and she felt fine without any recurrence.  She continues to monitor heart rate and has not noted any recurrent A. fib.  She denies any bleeding complications on Xarelto.  Overall energy level is good.  She is followed by her PCP closely and recently did a virtual visit.  Blood pressures well controlled  today.  07/18/2020  Ms. [name] is seen today in follow-up.  Overall she is doing well.  She says that recently though she has had more episodes of atrial fibrillation.  She had an episode in December which responded to pocket pill flecainide.  Since then she has had 3 other episodes and they generally last less than 24 hours and seem to respond to flecainide but she feels drained for at least the next 24 to 48 hours.  Her husband was diagnosed with multivessel coronary disease this spring as well and underwent bypass surgery and he is doing better.  She does have a screening colonoscopy scheduled for September.   Wt Readings from Last 3 Encounters:  07/18/20 164 lb (74.4 kg)  06/13/20 164 lb 12.8 oz (74.8 kg)  03/14/20 161 lb 6.4 oz (73.2 kg)     Past Medical History:  Diagnosis Date  . AF (atrial fibrillation) ([place])    AF with RVR 01/2013  . Allergy   . Anxiety   . Atrial fibrillation with RVR ([place]) 08/31/2015  . Colon polyps   . Complication of anesthesia 2011   colonscopy- "slow to awaken"  . Depression   . DJD (degenerative joint disease) of cervical spine   . Family history of CABG   . GERD (gastroesophageal reflux disease)   . History of kidney stones   . Hyperglycemia   . Hyperlipidemia   . Jaw pain 08/31/2015  . Pneumonia   . PONV (postoperative nausea and vomiting)     Current Outpatient Medications  Medication Sig Dispense Refill  . cimetidine (TAGAMET) 200 MG tablet Take 200 mg by mouth daily.    . fexofenadine (ALLEGRA) 180 MG tablet Take 180 mg by mouth daily.    . flecainide (TAMBOCOR) 100 MG tablet TAKE TWO TABLETS BY MOUTH WITH ONSET OF ARTIAL FIB. NO MORE THAN ONCE PER WEEK 6 tablet 5  . fluticasone (FLONASE) 50 MCG/ACT nasal spray Use 2 spray(s) in each nostril once daily 48 g 0  . hydrOXYzine (ATARAX/VISTARIL) 50 MG tablet Take 1 tablet (50 mg total) by mouth at bedtime. Prn itch 30 tablet 0  . metoprolol tartrate (LOPRESSOR) 25 MG tablet Take 1/2 (one-half)  tablet by mouth twice daily 90 tablet 3  . montelukast (SINGULAIR) 10 MG tablet TAKE 1 TABLET BY MOUTH AT BEDTIME 90 tablet 0  . nystatin-triamcinolone ointment (MYCOLOG) Applied to rash twice daily until resolved (Patient taking differently: Apply 1 application topically. Applied to rash twice daily PRN) 60 g 0  . rivaroxaban (XARELTO) 20 MG TABS tablet Take 1 tablet (20 mg total) by mouth daily with supper. Please keep your upcoming appointment for refills. 90 tablet 0  . rosuvastatin (CRESTOR) 20 MG tablet Take 1 tablet (20 mg total) by mouth daily. 90 tablet 1  . sertraline (ZOLOFT) 100 MG tablet Take 1 tablet (100 mg total) by mouth daily. 90 tablet 0  . Vitamin D, Ergocalciferol, (DRISDOL) 1.25 MG (50000 UNIT) CAPS capsule Take 1 capsule by mouth once a week 12 capsule  0   No current facility-administered medications for this visit.    Allergies:    Allergies  Allergen Reactions  . Adhesive [Tape]     Burn skin  . Erythromycin     Gi intolerance - nausea, vomiting, and abd pains  . Naproxen Nausea And Vomiting    Abdominal pain   . Penicillins Hives    Has patient had a PCN reaction causing immediate rash, facial/tongue/throat swelling, SOB or lightheadedness with hypotension: UNKNOWN Has patient had a PCN reaction causing severe rash involving mucus membranes or skin necrosis: No Has patient had a PCN reaction that required hospitalization No Has patient had a PCN reaction occurring within the last 10 years: No If all of the above answers are "NO", then may proceed with Cephalosporin use.     Social History:  The patient  reports that she quit smoking about 15 years ago. Her smoking use included cigarettes. She started smoking about 57 years ago. She has a 18.00 pack-year smoking history. She has never used smokeless tobacco. She reports that she does not drink alcohol and does not use drugs.   Family history:   Family History  Problem Relation Age of Onset  . Stroke Mother    . Diabetes Father   . Heart disease Father        CABG  . Arthritis Father        C6-7  . Skin cancer Father        SCC  . Arthritis Brother        s/p THR    ROS: A comprehensive review of systems was negative.  HOME MEDS: Current Outpatient Medications  Medication Sig Dispense Refill  . cimetidine (TAGAMET) 200 MG tablet Take 200 mg by mouth daily.    . fexofenadine (ALLEGRA) 180 MG tablet Take 180 mg by mouth daily.    . flecainide (TAMBOCOR) 100 MG tablet TAKE TWO TABLETS BY MOUTH WITH ONSET OF ARTIAL FIB. NO MORE THAN ONCE PER WEEK 6 tablet 5  . fluticasone (FLONASE) 50 MCG/ACT nasal spray Use 2 spray(s) in each nostril once daily 48 g 0  . hydrOXYzine (ATARAX/VISTARIL) 50 MG tablet Take 1 tablet (50 mg total) by mouth at bedtime. Prn itch 30 tablet 0  . metoprolol tartrate (LOPRESSOR) 25 MG tablet Take 1/2 (one-half) tablet by mouth twice daily 90 tablet 3  . montelukast (SINGULAIR) 10 MG tablet TAKE 1 TABLET BY MOUTH AT BEDTIME 90 tablet 0  . nystatin-triamcinolone ointment (MYCOLOG) Applied to rash twice daily until resolved (Patient taking differently: Apply 1 application topically. Applied to rash twice daily PRN) 60 g 0  . rivaroxaban (XARELTO) 20 MG TABS tablet Take 1 tablet (20 mg total) by mouth daily with supper. Please keep your upcoming appointment for refills. 90 tablet 0  . rosuvastatin (CRESTOR) 20 MG tablet Take 1 tablet (20 mg total) by mouth daily. 90 tablet 1  . sertraline (ZOLOFT) 100 MG tablet Take 1 tablet (100 mg total) by mouth daily. 90 tablet 0  . Vitamin D, Ergocalciferol, (DRISDOL) 1.25 MG (50000 UNIT) CAPS capsule Take 1 capsule by mouth once a week 12 capsule 0   No current facility-administered medications for this visit.    LABS/IMAGING: No results found for this or any previous visit (from the past 48 hour(s)). No results found.  VITALS: BP 112/66   Pulse (!) 55   Ht 5\' 5"  (1.651 m)   Wt 164 lb (74.4 kg)   BMI 27.29 kg/m  EXAM: General appearance: alert and no distress Neck: no carotid bruit and no JVD Lungs: clear to auscultation bilaterally Heart: regular rate and rhythm, S1, S2 normal, no murmur, click, rub or gallop Abdomen: soft, non-tender; bowel sounds normal; no masses,  no organomegaly Extremities: extremities normal, atraumatic, no cyanosis or edema Pulses: 2+ and symmetric Skin: Skin color, texture, turgor normal. No rashes or lesions Neurologic: Grossly normal Psych: Mood, affect normal  EKG: Sinus bradycardia 55, nonspecific ST changes-personally reviewed  ASSESSMENT: 1. Paroxysmal atrial fibrillation - CHADSVASC score of 1 (female >65) on Xarelto 2. Flecainide pocket pill strategy 3. Dyslipidemia- at goal  PLAN: 1.   Mrs. Telford has been having more frequent episodes of atrial fibrillation, at least for over the last 6 months.  Because of the frequent increase in her symptoms as well as the fact that she is very fatigued after each episode, I think she is a good candidate to switch to daily dosing of her flecainide in order to try to prevent these episodes.  She is agreeable to that and will start 50 mg twice daily.  Continue low-dose metoprolol.  Will need to repeat an EKG in about 1 to 2 weeks to monitor for changes in QRS and QTc.  Continue Xarelto.  Okay to hold 2 days prior to upcoming colonoscopy.  Lipids are at goal with LDL less than 70.  Follow-up with me in 3 months.  Pixie Casino, MD, [facility], [place] Director of the Advanced Lipid Disorders &  Cardiovascular Risk Reduction Clinic Diplomate of the American Board of Clinical Lipidology Attending Cardiologist  Direct Dial: [phone]  Fax: [phone]  Website:  [email or web address] Gothard 07/18/2020, 3:01 PM

## 2020-07-19 ENCOUNTER — Ambulatory Visit
Admission: RE | Admit: 2020-07-19 | Discharge: 2020-07-19 | Disposition: A | Discharge status: Home / Self Care | Payer: PPO | Source: Ambulatory Visit | Attending: Physician Assistant | Admitting: Physician Assistant

## 2020-07-19 DIAGNOSIS — Z1231 Encounter for screening mammogram for malignant neoplasm of breast: Secondary | ICD-10-CM

## 2020-08-01 ENCOUNTER — Other Ambulatory Visit: Payer: Self-pay

## 2020-08-01 ENCOUNTER — Ambulatory Visit (INDEPENDENT_AMBULATORY_CARE_PROVIDER_SITE_OTHER): Payer: PPO | Admitting: *Deleted

## 2020-08-01 VITALS — Ht 65.0 in | Wt 168.8 lb

## 2020-08-01 DIAGNOSIS — I480 Paroxysmal atrial fibrillation: Secondary | ICD-10-CM | POA: Diagnosis not present

## 2020-08-01 NOTE — Progress Notes (Signed)
1.) Reason for visit: ECG  2.) Name of MD requesting visit: hilty  3.) H&P: recent medication change  4.) ROS related to problem: recent medication change checking QT  5.) Assessment and plan per MD -per dr hilty ECG is good and no changes made at this time.

## 2020-08-02 ENCOUNTER — Other Ambulatory Visit: Payer: Self-pay

## 2020-08-02 ENCOUNTER — Telehealth: Payer: Self-pay | Admitting: Gastroenterology

## 2020-08-02 MED ORDER — SUTAB 1479-225-188 MG PO TABS: ORAL_TABLET | ORAL | 0 refills | Status: DC

## 2020-08-02 NOTE — Telephone Encounter (Signed)
Pt states she needs the prep SUTAB called in to her pharmacy for her colonoscopy.

## 2020-08-02 NOTE — Telephone Encounter (Signed)
Rx has been sent to the pharmacy on file.  

## 2020-08-06 ENCOUNTER — Encounter: Payer: Self-pay | Admitting: Emergency Medicine

## 2020-08-06 ENCOUNTER — Other Ambulatory Visit: Payer: Self-pay

## 2020-08-06 ENCOUNTER — Ambulatory Visit
Admission: EM | Admit: 2020-08-06 | Discharge: 2020-08-06 | Disposition: A | Discharge status: Home / Self Care | Payer: PPO | Attending: Emergency Medicine | Admitting: Emergency Medicine

## 2020-08-06 DIAGNOSIS — N3001 Acute cystitis with hematuria: Secondary | ICD-10-CM | POA: Insufficient documentation

## 2020-08-06 LAB — POCT URINALYSIS DIP (MANUAL ENTRY)
Bilirubin, UA: NEGATIVE
Glucose, UA: NEGATIVE mg/dL
Ketones, POC UA: NEGATIVE mg/dL
Nitrite, UA: NEGATIVE
Protein Ur, POC: NEGATIVE mg/dL
Spec Grav, UA: <=1.005 — AB (ref 1.010–1.025)
Urobilinogen, UA: 0.2 E.U./dL
pH, UA: 5.5 (ref 5.0–8.0)

## 2020-08-06 MED ORDER — CEPHALEXIN 500 MG PO CAPS: 500.0000 mg | ORAL_CAPSULE | Freq: Two times a day (BID) | ORAL | 0 refills | Status: AC

## 2020-08-06 NOTE — Discharge Instructions (Addendum)
Take antibiotic twice daily with food. °Important to drink plenty of water throughout the day. °May take Azo as needed for burning sensation. °Return for worsening urinary symptoms, blood in urine, abdominal or back pain, fever. °

## 2020-08-06 NOTE — ED Provider Notes (Signed)
[facility]    CSN: [id number] Arrival date & time: 08/06/20  1335      History   Chief Complaint Chief Complaint  Patient presents with  . Dysuria    HPI Daisy Nelson is a 74 y.o. female  Presenting for evaluation of possible UTI.  Patient states that she has had history thereof: Feels similar.  Endorsing burning with urination, "bladder pressure" and urgency.  Denies frequency, full dipstick, polyphagia, abdominal pain, vomiting, diarrhea, hematuria.  Has not taken anything for this.  Past Medical History:  Diagnosis Date  . AF (atrial fibrillation) ([place])    AF with RVR 01/2013  . Allergy   . Anxiety   . Atrial fibrillation with RVR ([place]) 08/31/2015  . Colon polyps   . Complication of anesthesia 2011   colonscopy- "slow to awaken"  . Depression   . DJD (degenerative joint disease) of cervical spine   . Family history of CABG   . GERD (gastroesophageal reflux disease)   . History of kidney stones   . Hyperglycemia   . Hyperlipidemia   . Jaw pain 08/31/2015  . Pneumonia   . PONV (postoperative nausea and vomiting)     Patient Active Problem List   Diagnosis Date Noted  . Vitamin D deficiency 12/14/2019  . Hypertension 06/12/2019  . Environmental and seasonal allergies 10/24/2018  . hx tob abuse;  30 yrs at roughly 1/2 ppd 05/11/2018  . Drug-induced bradycardia-  rate controlled on metoprolol by cards, patient ASx 05/10/2018  . Mood disorder ([place]) 05/10/2018  . Preretinal fibrosis, right eye 08/25/2016  . Prediabetes 03/26/2015  . PAF (paroxysmal atrial fibrillation) ([place]) 07/10/2013  . Atrial fibrillation with rapid ventricular response ([place]) 02/06/2013  . Family history of coronary artery bypass surgery 02/06/2013  . GERD (gastroesophageal reflux disease) 10/25/2012  . Allergic rhinitis 10/25/2012  . Hyperlipidemia 10/25/2012  . Anxiety 10/25/2012  . DJD (degenerative joint disease) of cervical spine 10/25/2012  . Nephrolithiasis 10/25/2012    . Colon polyps 10/25/2012  . Complication of anesthesia 11/30/2009    Past Surgical History:  Procedure Laterality Date  . [place] VITRECTOMY WITH 20 GAUGE MVR PORT FOR MACULAR HOLE Right 08/25/2016   Procedure: 25 GAUGE PARS PLANA VITRECTOMY; RIGHT EYE;  Surgeon: [name], MD;  Location: [place];  Service: Ophthalmology;  Laterality: Right;  . ABDOMINAL HYSTERECTOMY  1991  . CATARACT EXTRACTION W/ INTRAOCULAR LENS  IMPLANT, BILATERAL Bilateral 03/2013  . CESAREAN SECTION  1976  . COLONOSCOPY  2011   polyps  . EYE SURGERY    . GANGLION CYST EXCISION Left    hand  . GAS/FLUID EXCHANGE Right 08/25/2016   Procedure: GAS/FLUID EXCHANGE RIGHT EYE;  Surgeon: [name], MD;  Location: [place];  Service: Ophthalmology;  Laterality: Right;  . IRIDECTOMY Right 08/25/2016   Procedure: PERIPHERAL IRIDECTOMY RIGHT EYE;  Surgeon: [name], MD;  Location: [place];  Service: Ophthalmology;  Laterality: Right;  . LAPAROSCOPY  1976  . MEMBRANE PEEL Right 08/25/2016   Procedure: MEMBRANE PEEL RIGHT EYE;  Surgeon: [name], MD;  Location: [place];  Service: Ophthalmology;  Laterality: Right;  . NM MYOCAR PERF WALL MOTION  01/2013   lexiscan - no pharmacologically induced ischemia, fixed defects in apical segments of anterior septum & inferior lateral wall   . PARS PLANA VITRECTOMY Right 08/25/2016   laser, gas injection, AC wash out. Membrane peel right eye  . TRANSTHORACIC ECHOCARDIOGRAM  01/2013  EF 55-60%, mod conc LVH; mild MR with mildly thickened leaflets; mild TR; PA peak pressure 53mHg  . TUBAL LIGATION  1977    OB History   No obstetric history on file.      Home Medications    Prior to Admission medications   Medication Sig Start Date End Date Taking? Authorizing Provider  cephALEXin (KEFLEX) 500 MG capsule Take 1 capsule (500 mg total) by mouth 2 (two) times daily for 5 days. 08/06/20 08/11/20  Hall-Potvin, BTanz[place] PA-C  cimetidine (TAGAMET) 200 MG tablet  Take 200 mg by mouth daily.    [provider]  fexofenadine (ALLEGRA) 180 MG tablet Take 180 mg by mouth daily.    [provider]  flecainide (TAMBOCOR) 50 MG tablet Take 1 tablet (50 mg total) by mouth 2 (two) times daily. 07/18/20   Hilty, KNadean Corwin MD  fluticasone (FLONASE) 50 MCG/ACT nasal spray Use 2 spray(s) in each nostril once daily 06/13/20   Abonza, Maritza, PA-C  hydrOXYzine (ATARAX/VISTARIL) 50 MG tablet Take 1 tablet (50 mg total) by mouth at bedtime. Prn itch 12/28/19   Opalski, DNeoma Laming DO  metoprolol tartrate (LOPRESSOR) 25 MG tablet Take 1/2 (one-half) tablet by mouth twice daily 07/18/20   Hilty, KNadean Corwin MD  montelukast (SINGULAIR) 10 MG tablet TAKE 1 TABLET BY MOUTH AT BEDTIME 07/10/20   ALorrene Reid PA-C  nystatin-triamcinolone ointment (MYCOLOG) Applied to rash twice daily until resolved Patient taking differently: Apply 1 application topically. Applied to rash twice daily PRN 12/28/19   OMellody Dance DO  rivaroxaban (XARELTO) 20 MG TABS tablet Take 1 tablet (20 mg total) by mouth daily with supper. 07/18/20   Hilty, KNadean Corwin MD  rosuvastatin (CRESTOR) 20 MG tablet Take 1 tablet (20 mg total) by mouth daily. 07/18/20   Hilty, KNadean Corwin MD  sertraline (ZOLOFT) 100 MG tablet Take 1 tablet (100 mg total) by mouth daily. 07/01/20   ALorrene Reid PA-C  Sodium Sulfate-Mag Sulfate-KCl (SUTAB) [phone] TABS USE SUTAB KIT AS DIRECTED.  MANUFACTURER CODES!! BIN: [id number]: CN GROUP: [id number] ID: [id number];GYBAS CASH;NO PRIOR AUTHORIZATION 08/02/20   DDoran Stabler MD  Vitamin D, Ergocalciferol, (DRISDOL) 1.25 MG (50000 UNIT) CAPS capsule Take 1 capsule by mouth once a week 05/24/20   ALorrene Reid PA-C  atorvastatin (LIPITOR) 40 MG tablet Take 1 tablet (40 mg total) by mouth at bedtime. ( no RF until bldwrk done) 03/07/19   OMellody Dance DO    Family History Family History  Problem Relation Age of Onset  . Stroke Mother   .  Diabetes Father   . Heart disease Father        CABG  . Arthritis Father        C6-7  . Skin cancer Father        SCC  . Arthritis Brother        s/p THR    Social History Social History   Tobacco Use  . Smoking status: Former Smoker    Packs/day: 0.50    Years: 36.00    Pack years: 18.00    Types: Cigarettes    Start date: 03/15/1963    Quit date: 01/08/2005    Years since quitting: 15.5  . Smokeless tobacco: Never Used  Vaping Use  . Vaping Use: Never used  Substance Use Topics  . Alcohol use: No  . Drug use: No     Allergies   Adhesive [tape], Erythromycin, Naproxen, and Penicillins   Review  of Systems As per HPI   Physical Exam Triage Vital Signs ED Triage Vitals  Enc Vitals Group     BP      Pulse      Resp      Temp      Temp src      SpO2      Weight      Height      Head Circumference      Peak Flow      Pain Score      Pain Loc      Pain Edu?      Excl. in GC?    No data found.  Updated Vital Signs BP 140/75 (BP Location: Left Arm)   Pulse (!) 52   Temp 98.1 F (36.7 C) (Oral)   Resp 18   SpO2 94%   Visual Acuity Right Eye Distance:   Left Eye Distance:   Bilateral Distance:    Right Eye Near:   Left Eye Near:    Bilateral Near:     Physical Exam Constitutional:      General: She is not in acute distress. HENT:     Head: Normocephalic and atraumatic.  Eyes:     General: No scleral icterus.    Pupils: Pupils are equal, round, and reactive to light.  Cardiovascular:     Rate and Rhythm: Normal rate.  Pulmonary:     Effort: Pulmonary effort is normal.  Abdominal:     General: Bowel sounds are normal.     Palpations: Abdomen is soft.     Tenderness: There is no abdominal tenderness. There is no right CVA tenderness, left CVA tenderness or guarding.  Skin:    Coloration: Skin is not jaundiced or pale.  Neurological:     Mental Status: She is alert and oriented to person, place, and time.      UC Treatments /  Results  Labs (all labs ordered are listed, but only abnormal results are displayed) Labs Reviewed  POCT URINALYSIS DIP (MANUAL ENTRY) - Abnormal; Notable for the following components:      Result Value   Spec Grav, UA <=1.005 (*)    Blood, UA moderate (*)    Leukocytes, UA Small (1+) (*)    All other components within normal limits  URINE CULTURE    EKG   Radiology No results found.  Procedures Procedures (including critical care time)  Medications Ordered in UC Medications - No data to display  Initial Impression / Assessment and Plan / UC Course  I have reviewed the triage vital signs and the nursing notes.  Pertinent labs & imaging results that were available during my care of the patient were reviewed by me and considered in my medical decision making (see chart for details).     Afebrile, nontoxic in office today.  Urine dipstick with leukocytes, blood.  Patient is symptomatic.  06/16/18 culture w/ E. coli sensitive to all the tetracycline.  We will start Keflex which she has tolerated well in the past.  Return precautions discussed, pt verbalized understanding and is agreeable to plan. Final Clinical Impressions(s) / UC Diagnoses   Final diagnoses:  Acute cystitis with hematuria     Discharge Instructions     Take antibiotic twice daily with food. Important to drink plenty of water throughout the day. May take Azo as needed for burning sensation. Return for worsening urinary symptoms, blood in urine, abdominal or back pain, fever.    ED   Prescriptions    Medication Sig Dispense Auth. Provider   cephALEXin (KEFLEX) 500 MG capsule Take 1 capsule (500 mg total) by mouth 2 (two) times daily for 5 days. 10 capsule Hall-Potvin, Brittany, PA-C     PDMP not reviewed this encounter.   Hall-Potvin, Brittany, PA-C 08/06/20 1620  

## 2020-08-06 NOTE — Progress Notes (Signed)
Attempted to obtain medical history via telephone, unable to reach at this time. I left a voicemail to return pre surgical testing department's phone call.  

## 2020-08-06 NOTE — ED Triage Notes (Signed)
Pt here for dysuria x 4 days worse today

## 2020-08-07 ENCOUNTER — Other Ambulatory Visit: Payer: Self-pay

## 2020-08-07 ENCOUNTER — Encounter (HOSPITAL_COMMUNITY): Payer: Self-pay | Admitting: Gastroenterology

## 2020-08-09 LAB — URINE CULTURE: Culture: 60000 — AB

## 2020-08-12 ENCOUNTER — Other Ambulatory Visit: Payer: Self-pay | Admitting: Physician Assistant

## 2020-08-12 DIAGNOSIS — E559 Vitamin D deficiency, unspecified: Secondary | ICD-10-CM

## 2020-08-12 DIAGNOSIS — Z780 Asymptomatic menopausal state: Secondary | ICD-10-CM

## 2020-08-15 ENCOUNTER — Other Ambulatory Visit (HOSPITAL_COMMUNITY)
Admission: RE | Admit: 2020-08-15 | Discharge: 2020-08-15 | Disposition: A | Discharge status: Home / Self Care | Payer: PPO | Source: Ambulatory Visit | Attending: Gastroenterology | Admitting: Gastroenterology

## 2020-08-15 DIAGNOSIS — Z20822 Contact with and (suspected) exposure to covid-19: Secondary | ICD-10-CM | POA: Diagnosis not present

## 2020-08-15 DIAGNOSIS — Z01812 Encounter for preprocedural laboratory examination: Secondary | ICD-10-CM | POA: Diagnosis not present

## 2020-08-15 LAB — SARS CORONAVIRUS 2 (TAT 6-24 HRS): SARS Coronavirus 2: NEGATIVE

## 2020-08-19 ENCOUNTER — Ambulatory Visit (HOSPITAL_COMMUNITY)
Admission: RE | Admit: 2020-08-19 | Discharge: 2020-08-19 | Disposition: A | Discharge status: Home / Self Care | Payer: PPO | Attending: Gastroenterology | Admitting: Gastroenterology

## 2020-08-19 ENCOUNTER — Other Ambulatory Visit: Payer: Self-pay

## 2020-08-19 ENCOUNTER — Encounter (HOSPITAL_COMMUNITY)
Admission: RE | Disposition: A | Discharge status: Home / Self Care | Payer: Self-pay | Source: Home / Self Care | Attending: Gastroenterology

## 2020-08-19 ENCOUNTER — Encounter (HOSPITAL_COMMUNITY): Payer: Self-pay | Admitting: Gastroenterology

## 2020-08-19 ENCOUNTER — Ambulatory Visit (HOSPITAL_COMMUNITY): Payer: PPO | Admitting: Registered Nurse

## 2020-08-19 DIAGNOSIS — Z86010 Personal history of colonic polyps: Secondary | ICD-10-CM | POA: Diagnosis not present

## 2020-08-19 DIAGNOSIS — K5730 Diverticulosis of large intestine without perforation or abscess without bleeding: Secondary | ICD-10-CM | POA: Diagnosis not present

## 2020-08-19 DIAGNOSIS — Z87891 Personal history of nicotine dependence: Secondary | ICD-10-CM | POA: Insufficient documentation

## 2020-08-19 DIAGNOSIS — D123 Benign neoplasm of transverse colon: Secondary | ICD-10-CM | POA: Diagnosis not present

## 2020-08-19 DIAGNOSIS — Q438 Other specified congenital malformations of intestine: Secondary | ICD-10-CM | POA: Insufficient documentation

## 2020-08-19 DIAGNOSIS — D122 Benign neoplasm of ascending colon: Secondary | ICD-10-CM | POA: Insufficient documentation

## 2020-08-19 DIAGNOSIS — Z09 Encounter for follow-up examination after completed treatment for conditions other than malignant neoplasm: Secondary | ICD-10-CM | POA: Diagnosis present

## 2020-08-19 DIAGNOSIS — I480 Paroxysmal atrial fibrillation: Secondary | ICD-10-CM | POA: Insufficient documentation

## 2020-08-19 DIAGNOSIS — Z7901 Long term (current) use of anticoagulants: Secondary | ICD-10-CM

## 2020-08-19 HISTORY — DX: Presence of spectacles and contact lenses: Z97.3

## 2020-08-19 HISTORY — DX: Cardiac arrhythmia, unspecified: I49.9

## 2020-08-19 HISTORY — PX: COLONOSCOPY WITH PROPOFOL: SHX5780

## 2020-08-19 HISTORY — PX: POLYPECTOMY: SHX5525

## 2020-08-19 SURGERY — COLONOSCOPY WITH PROPOFOL
Anesthesia: Monitor Anesthesia Care

## 2020-08-19 MED ORDER — LIDOCAINE HCL (CARDIAC) PF 100 MG/5ML IV SOSY
PREFILLED_SYRINGE | INTRAVENOUS | Status: DC | PRN
  Administered 2020-08-19: 50 mg via INTRATRACHEAL

## 2020-08-19 MED ORDER — EPHEDRINE SULFATE-NACL 50-0.9 MG/10ML-% IV SOSY
PREFILLED_SYRINGE | INTRAVENOUS | Status: DC | PRN
  Administered 2020-08-19: 10 mg via INTRAVENOUS
  Administered 2020-08-19: 5 mg via INTRAVENOUS

## 2020-08-19 MED ORDER — LACTATED RINGERS IV SOLN
INTRAVENOUS | Status: DC
  Administered 2020-08-19: 1000 mL via INTRAVENOUS

## 2020-08-19 MED ORDER — PROPOFOL 1000 MG/100ML IV EMUL
INTRAVENOUS | Status: AC
  Filled 2020-08-19: qty 100

## 2020-08-19 MED ORDER — GLYCOPYRROLATE 0.2 MG/ML IJ SOLN
INTRAMUSCULAR | Status: DC | PRN
  Administered 2020-08-19: .2 mg via INTRAVENOUS

## 2020-08-19 MED ORDER — PROPOFOL 500 MG/50ML IV EMUL
INTRAVENOUS | Status: DC | PRN
  Administered 2020-08-19 (×2): 20 mg via INTRAVENOUS
  Administered 2020-08-19 (×2): 40 mg via INTRAVENOUS

## 2020-08-19 MED ORDER — PROPOFOL 500 MG/50ML IV EMUL
INTRAVENOUS | Status: DC | PRN
  Administered 2020-08-19: 135 ug/kg/min via INTRAVENOUS

## 2020-08-19 MED ORDER — SODIUM CHLORIDE 0.9 % IV SOLN: INTRAVENOUS | Status: DC

## 2020-08-19 SURGICAL SUPPLY — 22 items

## 2020-08-19 NOTE — Anesthesia Preprocedure Evaluation (Addendum)
Anesthesia Evaluation  Patient identified by MRN, date of birth, ID band Patient awake    Reviewed: Allergy & Precautions, H&P , NPO status , Patient's Chart, lab work & pertinent test results, reviewed documented beta blocker date and time   History of Anesthesia Complications (+) PONV, PROLONGED EMERGENCE and history of anesthetic complications  Airway Mallampati: I  TM Distance: >3 FB Neck ROM: full    Dental no notable dental hx.    Pulmonary former smoker,    Pulmonary exam normal breath sounds clear to auscultation       Cardiovascular Exercise Tolerance: Good hypertension, + dysrhythmias (paroxysmal afib) Atrial Fibrillation  Rhythm:regular Rate:Normal     Neuro/Psych PSYCHIATRIC DISORDERS Anxiety Depression    GI/Hepatic GERD  ,  Endo/Other    Renal/GU Renal disease (stones)     Musculoskeletal  (+) Arthritis ,   Abdominal   Peds  Hematology   Anesthesia Other Findings   Reproductive/Obstetrics                            Anesthesia Physical  Anesthesia Plan  ASA: III  Anesthesia Plan: MAC   Post-op Pain Management:    Induction: Intravenous  PONV Risk Score and Plan: 3 and TIVA, Treatment may vary due to age or medical condition and Propofol infusion  Airway Management Planned: Natural Airway and Simple Face Mask  Additional Equipment:   Intra-op Plan:   Post-operative Plan:   Informed Consent: I have reviewed the patients History and Physical, chart, labs and discussed the procedure including the risks, benefits and alternatives for the proposed anesthesia with the patient or authorized representative who has indicated his/her understanding and acceptance.       Plan Discussed with: CRNA and Anesthesiologist  Anesthesia Plan Comments: ( )        Anesthesia Quick Evaluation

## 2020-08-19 NOTE — Anesthesia Procedure Notes (Signed)
Procedure Name: MAC Date/Time: 08/19/2020 11:28 AM Performed by: Niel Hummer, CRNA Pre-anesthesia Checklist: Patient identified, Emergency Drugs available, Suction available and Patient being monitored Oxygen Delivery Method: Simple face mask

## 2020-08-19 NOTE — H&P (Signed)
History:  This patient presents for endoscopic testing for Hx colon polyps  Details in Jan 2021 office note. Challenging scope passage described on prior exam by different MD. No GI clinical changes since then. A fib on [place] Referring physician: Lorrene Reid, PA-C  Past Medical History: Past Medical History:  Diagnosis Date  . AF (atrial fibrillation) ([place])    AF with RVR 01/2013  . Allergy   . Anxiety   . Atrial fibrillation with RVR ([place]) 08/31/2015  . Colon polyps   . Complication of anesthesia 2011   colonscopy- "slow to awaken"  . Depression   . DJD (degenerative joint disease) of cervical spine   . Dysrhythmia    afib  . Family history of CABG   . GERD (gastroesophageal reflux disease)   . History of kidney stones   . Hyperglycemia   . Hyperlipidemia   . Jaw pain 08/31/2015  . Pneumonia   . PONV (postoperative nausea and vomiting)   . Wears glasses      Past Surgical History: Past Surgical History:  Procedure Laterality Date  . [place] VITRECTOMY WITH 20 GAUGE MVR PORT FOR MACULAR HOLE Right 08/25/2016   Procedure: 25 GAUGE PARS PLANA VITRECTOMY; RIGHT EYE;  Surgeon: [name], MD;  Location: [place];  Service: Ophthalmology;  Laterality: Right;  . ABDOMINAL HYSTERECTOMY  1991  . CATARACT EXTRACTION W/ INTRAOCULAR LENS  IMPLANT, BILATERAL Bilateral 03/2013  . CESAREAN SECTION  1976  . COLONOSCOPY  2011   polyps  . EYE SURGERY    . GANGLION CYST EXCISION Left    hand  . GAS/FLUID EXCHANGE Right 08/25/2016   Procedure: GAS/FLUID EXCHANGE RIGHT EYE;  Surgeon: [name], MD;  Location: [place];  Service: Ophthalmology;  Laterality: Right;  . IRIDECTOMY Right 08/25/2016   Procedure: PERIPHERAL IRIDECTOMY RIGHT EYE;  Surgeon: [name], MD;  Location: [place];  Service: Ophthalmology;  Laterality: Right;  . LAPAROSCOPY  1976  . MEMBRANE PEEL Right 08/25/2016   Procedure: MEMBRANE PEEL RIGHT EYE;  Surgeon: [name],  MD;  Location: [place];  Service: Ophthalmology;  Laterality: Right;  . NM MYOCAR PERF WALL MOTION  01/2013   lexiscan - no pharmacologically induced ischemia, fixed defects in apical segments of anterior septum & inferior lateral wall   . PARS PLANA VITRECTOMY Right 08/25/2016   laser, gas injection, AC wash out. Membrane peel right eye  . TRANSTHORACIC ECHOCARDIOGRAM  01/2013   EF 55-60%, mod conc LVH; mild MR with mildly thickened leaflets; mild TR; PA peak pressure 41mmHg  . TUBAL LIGATION  1977    Allergies: Allergies  Allergen Reactions  . Adhesive [Tape]     Burn skin  . Erythromycin     Gi intolerance - nausea, vomiting, and abd pains  . Naproxen Nausea And Vomiting    Abdominal pain   . Penicillins Hives    Has patient had a PCN reaction causing immediate rash, facial/tongue/throat swelling, SOB or lightheadedness with hypotension: UNKNOWN Has patient had a PCN reaction causing severe rash involving mucus membranes or skin necrosis: No Has patient had a PCN reaction that required hospitalization No Has patient had a PCN reaction occurring within the last 10 years: No If all of the above answers are "NO", then may proceed with Cephalosporin use.     Outpatient Meds: Current Facility-Administered Medications  Medication Dose Route Frequency Provider Last Rate Last Admin  . 0.9 %  sodium  chloride infusion   Intravenous Continuous Nelida Meuse III, MD      . lactated ringers infusion   Intravenous Continuous Nelida Meuse III, MD 125 mL/hr at 08/19/20 1117 Continued from Pre-op at 08/19/20 1117      ___________________________________________________________________ Objective   Exam:  BP (!) 141/60   Pulse (!) 59   Temp 97.7 F (36.5 C) (Axillary)   Resp 10   Ht 5\' 5"  (1.651 m)   Wt 72.1 kg   SpO2 96%   BMI 26.46 kg/m    CV: RRR without murmur, S1/S2, no JVD, no peripheral edema  Resp: clear to auscultation bilaterally, normal RR and effort noted  GI:  soft, no tenderness, with active bowel sounds. No guarding or palpable organomegaly noted.  Neuro: awake, alert and oriented x 3. Normal gross motor function and fluent speech   Assessment:  Hx colon polyps  Plan:  Colonoscopy   Nelida Meuse III

## 2020-08-19 NOTE — Interval H&P Note (Signed)
History and Physical Interval Note:  08/19/2020 11:26 AM  Daisy Nelson  has presented today for surgery, with the diagnosis of hx of colon polyps.  The various methods of treatment have been discussed with the patient and family. After consideration of risks, benefits and other options for treatment, the patient has consented to  Procedure(s): COLONOSCOPY WITH PROPOFOL (N/A) as a surgical intervention.  The patient's history has been reviewed, patient examined, no change in status, stable for surgery.  I have reviewed the patient's chart and labs.  Questions were answered to the patient's satisfaction.     Nelida Meuse III

## 2020-08-19 NOTE — Op Note (Signed)
[facility] Patient Name: Daisy Nelson Procedure Date: 08/19/2020 MRN: [id number] Attending MD: Estill Cotta. Loletha Carrow , MD Date of Birth: 1946/02/13 CSN: [id number] Age: 74 Admit Type: Outpatient Procedure:                Colonoscopy Indications:              Surveillance: Personal history of adenomatous                            polyps on last colonoscopy > 3 years ago (9 polyps,                            most adenomatous, 2017) Providers:                Estill Cotta. Loletha Carrow, MD, Cleda Daub, RN, [employer] Referring MD:              Medicines:                Monitored Anesthesia Care Complications:            No immediate complications. Estimated Blood Loss:     Estimated blood loss was minimal. Procedure:                Pre-Anesthesia Assessment:                           - Prior to the procedure, a History and Physical                            was performed, and patient medications and                            allergies were reviewed. The patient's tolerance of                            previous anesthesia was also reviewed. The risks                            and benefits of the procedure and the sedation                            options and risks were discussed with the patient.                            All questions were answered, and informed consent                            was obtained. Prior Anticoagulants: The patient has                            taken Xarelto (rivaroxaban), last dose was 2 days  prior to procedure. ASA Grade Assessment: III - A                            patient with severe systemic disease. After                            reviewing the risks and benefits, the patient was                            deemed in satisfactory condition to undergo the                            procedure.                           After obtaining informed consent, the colonoscope                             was passed under direct vision. Throughout the                            procedure, the patient's blood pressure, pulse, and                            oxygen saturations were monitored continuously. The                            PCF-H190DL ([id number] Olympus pediatric colonscope                            was introduced through the anus and advanced to the                            the cecum, identified by appendiceal orifice and                            ileocecal valve. The colonoscopy was performed with                            difficulty due to multiple diverticula in the                            colon, significant looping and a tortuous colon.                            Successful completion of the procedure was aided by                            changing the patient to a supine position and using                            manual pressure and water inflation. Scope In: 11:30:43 AM Scope Out: 12:09:04 PM Total Procedure Duration: 0 hours 38 minutes 21 seconds  Findings:  The perianal and digital rectal examinations were normal.      The left colon was significantly tortuous and redundant (see above).      Multiple small-mouthed diverticula were found in the sigmoid colon.      Four sessile polyps were found in the ascending colon. The polyps were 3       to 8 mm in size. These polyps were removed with a cold snare. Resection       and retrieval were complete.      Two sessile polyps were found in the proximal transverse colon and       ascending colon. The polyps were 4 to 6 mm in size. These polyps were       removed with a cold snare. Resection and retrieval were complete.      The exam was otherwise without abnormality on direct and retroflexion       views. Impression:               - Tortuous colon.                           - Diverticulosis in the sigmoid colon.                           - Four 3 to 8 mm polyps in the ascending colon,                             removed with a cold snare. Resected and retrieved.                           - Two 4 to 6 mm polyps in the proximal transverse                            colon and in the ascending colon, removed with a                            cold snare. Resected and retrieved.                           - The examination was otherwise normal on direct                            and retroflexion views. Moderate Sedation:      MAC sedation used Recommendation:           - Patient has a contact number available for                            emergencies. The signs and symptoms of potential                            delayed complications were discussed with the                            patient. Return to normal activities tomorrow.  Written discharge instructions were provided to the                            patient.                           - Resume previous diet.                           - Continue present medications.                           - Await pathology results.                           - Repeat colonoscopy is recommended for                            surveillance. The colonoscopy date will be                            determined after pathology results from today's                            exam become available for review.                           - Resume Xarelto (rivaroxaban) at prior dose in 2                            days. Procedure Code(s):        --- Professional ---                           [phone], Colonoscopy, flexible; with removal of                            tumor(s), polyp(s), or other lesion(s) by snare                            technique Diagnosis Code(s):        --- Professional ---                           Z86.010, Personal history of colonic polyps                           K63.5, Polyp of colon                           K57.30, Diverticulosis of large intestine without                            perforation or abscess  without bleeding                           Q43.8, Other specified congenital malformations of  intestine CPT copyright 2019 American [employer] Association. All rights reserved. The codes documented in this report are preliminary and upon coder review may  be revised to meet current compliance requirements. Leandrea Ackley L. Loletha Carrow, MD 08/19/2020 12:18:02 PM This report has been signed electronically. Number of Addenda: 0

## 2020-08-19 NOTE — Transfer of Care (Signed)
Immediate Anesthesia Transfer of Care Note  Patient: Daisy Nelson  Procedure(s) Performed: COLONOSCOPY WITH PROPOFOL (N/A ) POLYPECTOMY (N/A )  Patient Location: PACU  Anesthesia Type:MAC  Level of Consciousness: awake, alert  and oriented  Airway & Oxygen Therapy: Patient Spontanous Breathing and Patient connected to face mask oxygen  Post-op Assessment: Report given to RN, Post -op Vital signs reviewed and stable and Patient moving all extremities X 4  Post vital signs: Reviewed and stable  Last Vitals:  Vitals Value Taken Time  BP    Temp    Pulse    Resp    SpO2      Last Pain:  Vitals:   08/19/20 1010  TempSrc: Axillary  PainSc: 0-No pain         Complications: No complications documented.

## 2020-08-19 NOTE — Discharge Instructions (Signed)
YOU HAD AN ENDOSCOPIC PROCEDURE TODAY: Refer to the procedure report and other information in the discharge instructions given to you for any specific questions about what was found during the examination. If this information does not answer your questions, please call [place] office at [phone] to clarify.   YOU SHOULD EXPECT: Some feelings of bloating in the abdomen. Passage of more gas than usual. Walking can help get rid of the air that was put into your GI tract during the procedure and reduce the bloating. If you had a lower endoscopy (such as a colonoscopy or flexible sigmoidoscopy) you may notice spotting of blood in your stool or on the toilet paper. Some abdominal soreness may be present for a day or two, also.  DIET: Your first meal following the procedure should be a light meal and then it is ok to progress to your normal diet. A half-sandwich or bowl of soup is an example of a good first meal. Heavy or fried foods are harder to digest and may make you feel nauseous or bloated. Drink plenty of fluids but you should avoid alcoholic beverages for 24 hours. If you had a esophageal dilation, please see attached instructions for diet.    ACTIVITY: Your care partner should take you home directly after the procedure. You should plan to take it easy, moving slowly for the rest of the day. You can resume normal activity the day after the procedure however YOU SHOULD NOT DRIVE, use power tools, machinery or perform tasks that involve climbing or major physical exertion for 24 hours (because of the sedation medicines used during the test).   SYMPTOMS TO REPORT IMMEDIATELY: A gastroenterologist can be reached at any hour. Please call [phone]  for any of the following symptoms:  Following lower endoscopy (colonoscopy, flexible sigmoidoscopy) Excessive amounts of blood in the stool  Significant tenderness, worsening of abdominal pains  Swelling of the abdomen that is new, acute  Fever of 100 or  higher  Following upper endoscopy (EGD, EUS, ERCP, esophageal dilation) Vomiting of blood or coffee ground material  New, significant abdominal pain  New, significant chest pain or pain under the shoulder blades  Painful or persistently difficult swallowing  New shortness of breath  Black, tarry-looking or red, bloody stools  FOLLOW UP:  If any biopsies were taken you will be contacted by phone or by letter within the next 1-3 weeks. Call [phone]  if you have not heard about the biopsies in 3 weeks.  Please also call with any specific questions about appointments or follow up tests. RESUME YOUR XARELTO AT USUAL DOSE ON 08/21/20  ________________________________________________________

## 2020-08-19 NOTE — Anesthesia Postprocedure Evaluation (Signed)
Anesthesia Post Note  Patient: Daisy Nelson  Procedure(s) Performed: COLONOSCOPY WITH PROPOFOL (N/A ) POLYPECTOMY (N/A )     Patient location during evaluation: Endoscopy Anesthesia Type: MAC Level of consciousness: awake and alert Pain management: pain level controlled Vital Signs Assessment: post-procedure vital signs reviewed and stable Respiratory status: spontaneous breathing, nonlabored ventilation and respiratory function stable Cardiovascular status: blood pressure returned to baseline and stable Postop Assessment: no apparent nausea or vomiting Anesthetic complications: no   No complications documented.  Last Vitals:  Vitals:   08/19/20 1240 08/19/20 1250  BP: (!) 140/47   Pulse: (!) 59 (!) 59  Resp: 15 20  Temp:    SpO2: 96% 97%    Last Pain:  Vitals:   08/19/20 1250  TempSrc:   PainSc: 0-No pain                 Merlinda Frederick

## 2020-08-20 ENCOUNTER — Encounter (HOSPITAL_COMMUNITY): Payer: Self-pay | Admitting: Gastroenterology

## 2020-08-20 LAB — SURGICAL PATHOLOGY

## 2020-08-21 ENCOUNTER — Encounter: Payer: Self-pay | Admitting: Gastroenterology

## 2020-09-13 ENCOUNTER — Other Ambulatory Visit: Payer: Self-pay

## 2020-09-13 ENCOUNTER — Ambulatory Visit (INDEPENDENT_AMBULATORY_CARE_PROVIDER_SITE_OTHER): Payer: PPO | Admitting: Physician Assistant

## 2020-09-13 ENCOUNTER — Encounter: Payer: Self-pay | Admitting: Physician Assistant

## 2020-09-13 VITALS — BP 109/58 | HR 53 | Ht 65.0 in | Wt 162.2 lb

## 2020-09-13 DIAGNOSIS — F39 Unspecified mood [affective] disorder: Secondary | ICD-10-CM

## 2020-09-13 DIAGNOSIS — I10 Essential (primary) hypertension: Secondary | ICD-10-CM | POA: Diagnosis not present

## 2020-09-13 DIAGNOSIS — R7303 Prediabetes: Secondary | ICD-10-CM

## 2020-09-13 DIAGNOSIS — E559 Vitamin D deficiency, unspecified: Secondary | ICD-10-CM | POA: Diagnosis not present

## 2020-09-13 DIAGNOSIS — I4891 Unspecified atrial fibrillation: Secondary | ICD-10-CM

## 2020-09-13 DIAGNOSIS — Z23 Encounter for immunization: Secondary | ICD-10-CM | POA: Diagnosis not present

## 2020-09-13 DIAGNOSIS — E785 Hyperlipidemia, unspecified: Secondary | ICD-10-CM

## 2020-09-13 NOTE — Assessment & Plan Note (Signed)
>>  ASSESSMENT AND PLAN FOR PREDIABETES WRITTEN ON 09/13/2020  8:50 AM BY ABONZA, MARITZA, PA-C  -Asymptomatic -Follow a low carbohydrate and glucose diet. -Plan to repeat A1c with [facility].

## 2020-09-13 NOTE — Assessment & Plan Note (Signed)
-  Asymptomatic -Follow a low carbohydrate and glucose diet. -Plan to repeat A1c with [facility].

## 2020-09-13 NOTE — Progress Notes (Signed)
Established Patient Office Visit  Subjective:  Patient ID: Daisy Nelson, female    DOB: [birth year]  Age: 74 y.o. MRN: [id number]  CC:  Chief Complaint  Patient presents with  . Hypertension  . Hyperlipidemia  . Prediabetes    HPI Daisy Nelson presents for hypertension, hyperlipidemia, and prediabetes.  HTN: Pt denies chest pain, palpitations, dizziness or lower extremity swelling. Taking medication as directed without side effects. Checks BP at home and readings range in <120/80 and pulse usually 50s. Pt reports good hydration.  HLD: Pt taking medication as directed without issues. Reports RUQ odd sensation has improved with changing Lipitor to Crestor. Continues to stay active.  Prediabetes: Denies increased thirst or urination.   Mood: Reports she has been on Zoloft for many years and was started on medication due to stress related to work. Expresses interest on discontinuing medication in the near future since she feels like she doesn't need it anymore since she is retired, and her mood has remained stable.   Past Medical History:  Diagnosis Date  . AF (atrial fibrillation) ([place])    AF with RVR 01/2013  . Allergy   . Anxiety   . Atrial fibrillation with RVR ([place]) 08/31/2015  . Colon polyps   . Complication of anesthesia 2011   colonscopy- "slow to awaken"  . Depression   . DJD (degenerative joint disease) of cervical spine   . Dysrhythmia    afib  . Family history of CABG   . GERD (gastroesophageal reflux disease)   . History of kidney stones   . Hyperglycemia   . Hyperlipidemia   . Jaw pain 08/31/2015  . Pneumonia   . PONV (postoperative nausea and vomiting)   . Wears glasses     Past Surgical History:  Procedure Laterality Date  . [place] VITRECTOMY WITH 20 GAUGE MVR PORT FOR MACULAR HOLE Right 08/25/2016   Procedure: 25 GAUGE PARS PLANA VITRECTOMY; RIGHT EYE;  Surgeon: [name], MD;  Location: [facility];  Service: Ophthalmology;   Laterality: Right;  . ABDOMINAL HYSTERECTOMY  1991  . CATARACT EXTRACTION W/ INTRAOCULAR LENS  IMPLANT, BILATERAL Bilateral 03/2013  . CESAREAN SECTION  1976  . COLONOSCOPY  2011   polyps  . COLONOSCOPY WITH PROPOFOL N/A 08/19/2020   Procedure: COLONOSCOPY WITH PROPOFOL;  Surgeon: Doran Stabler, MD;  Location: [facility];  Service: Gastroenterology;  Laterality: N/A;  . EYE SURGERY    . GANGLION CYST EXCISION Left    hand  . GAS/FLUID EXCHANGE Right 08/25/2016   Procedure: GAS/FLUID EXCHANGE RIGHT EYE;  Surgeon: [name], MD;  Location: [facility];  Service: Ophthalmology;  Laterality: Right;  . IRIDECTOMY Right 08/25/2016   Procedure: PERIPHERAL IRIDECTOMY RIGHT EYE;  Surgeon: [name], MD;  Location: [place];  Service: Ophthalmology;  Laterality: Right;  . LAPAROSCOPY  1976  . MEMBRANE PEEL Right 08/25/2016   Procedure: MEMBRANE PEEL RIGHT EYE;  Surgeon: [name], MD;  Location:  [place];  Service: Ophthalmology;  Laterality: Right;  . NM MYOCAR PERF WALL MOTION  01/2013   lexiscan - no pharmacologically induced ischemia, fixed defects in apical segments of anterior septum & inferior lateral wall   . PARS PLANA VITRECTOMY Right 08/25/2016   laser, gas injection, AC wash out. Membrane peel right eye  . POLYPECTOMY N/A 08/19/2020   Procedure: POLYPECTOMY;  Surgeon: Doran Stabler, MD;  Location: [facility];  Service: Gastroenterology;  Laterality: N/A;  .  TRANSTHORACIC ECHOCARDIOGRAM  01/2013   EF 55-60%, mod conc LVH; mild MR with mildly thickened leaflets; mild TR; PA peak pressure 16mmHg  . TUBAL LIGATION  1977    Family History  Problem Relation Age of Onset  . Stroke Mother   . Diabetes Father   . Heart disease Father        CABG  . Arthritis Father        C6-7  . Skin cancer Father        SCC  . Arthritis Brother        s/p THR    Social History   Socioeconomic History  . Marital status: Married    Spouse name: Herbie Baltimore  . Number of children: 1  .  Years of education: 61  . Highest education level: Not on file  Occupational History  . Occupation: Medical illustrator  Tobacco Use  . Smoking status: Former Smoker    Packs/day: 0.50    Years: 36.00    Pack years: 18.00    Types: Cigarettes    Start date: 03/15/1963    Quit date: 01/08/2005    Years since quitting: 15.6  . Smokeless tobacco: Never Used  Vaping Use  . Vaping Use: Never used  Substance and Sexual Activity  . Alcohol use: No  . Drug use: No  . Sexual activity: Yes    Partners: Male    Birth control/protection: Surgical  Other Topics Concern  . Not on file  Social History Narrative   Lives with her husband and their cat, Elvis Coil.   Social Determinants of Health   Financial Resource Strain:   . Difficulty of Paying Living Expenses: Not on file  Food Insecurity:   . Worried About Charity fundraiser in the Last Year: Not on file  . Ran Out of Food in the Last Year: Not on file  Transportation Needs:   . Lack of Transportation (Medical): Not on file  . Lack of Transportation (Non-Medical): Not on file  Physical Activity:   . Days of Exercise per Week: Not on file  . Minutes of Exercise per Session: Not on file  Stress:   . Feeling of Stress : Not on file  Social Connections:   . Frequency of Communication with Friends and Family: Not on file  . Frequency of Social Gatherings with Friends and Family: Not on file  . Attends Religious Services: Not on file  . Active Member of Clubs or Organizations: Not on file  . Attends Archivist Meetings: Not on file  . Marital Status: Not on file  Intimate Partner Violence:   . Fear of Current or Ex-Partner: Not on file  . Emotionally Abused: Not on file  . Physically Abused: Not on file  . Sexually Abused: Not on file    Outpatient Medications Prior to Visit  Medication Sig Dispense Refill  . calcium carbonate (OSCAL) 1500 (600 Ca) MG TABS tablet Take 600 mg of elemental calcium by mouth 2 (two) times  daily with a meal.    . cimetidine (TAGAMET) 200 MG tablet Take 200 mg by mouth every evening.     . fexofenadine (ALLEGRA) 180 MG tablet Take 180 mg by mouth daily.    . flecainide (TAMBOCOR) 50 MG tablet Take 1 tablet (50 mg total) by mouth 2 (two) times daily. 180 tablet 3  . fluticasone (FLONASE) 50 MCG/ACT nasal spray Use 2 spray(s) in each nostril once daily (Patient taking differently: Place 1 spray  into both nostrils in the morning and at bedtime. Use 2 spray(s) in each nostril once daily) 48 g 0  . hydrOXYzine (ATARAX/VISTARIL) 50 MG tablet Take 1 tablet (50 mg total) by mouth at bedtime. Prn itch (Patient taking differently: Take 50 mg by mouth daily as needed for itching. ) 30 tablet 0  . metoprolol tartrate (LOPRESSOR) 25 MG tablet Take 1/2 (one-half) tablet by mouth twice daily (Patient taking differently: Take 12.5 mg by mouth 2 (two) times daily. ) 90 tablet 3  . montelukast (SINGULAIR) 10 MG tablet TAKE 1 TABLET BY MOUTH AT BEDTIME (Patient taking differently: Take 10 mg by mouth at bedtime. ) 90 tablet 0  . rivaroxaban (XARELTO) 20 MG TABS tablet Take 1 tablet (20 mg total) by mouth daily with supper. 90 tablet 1  . rosuvastatin (CRESTOR) 20 MG tablet Take 1 tablet (20 mg total) by mouth daily. 90 tablet 3  . sertraline (ZOLOFT) 100 MG tablet Take 1 tablet (100 mg total) by mouth daily. 90 tablet 0  . Vitamin D, Ergocalciferol, (DRISDOL) 1.25 MG (50000 UNIT) CAPS capsule Take 1 capsule by mouth once a week (Patient taking differently: Take 50,000 Units by mouth every 7 (seven) days. ) 12 capsule 0   No facility-administered medications prior to visit.    Allergies  Allergen Reactions  . Adhesive [Tape]     Burn skin  . Erythromycin     Gi intolerance - nausea, vomiting, and abd pains  . Naproxen Nausea And Vomiting    Abdominal pain   . Penicillins Hives    Has patient had a PCN reaction causing immediate rash, facial/tongue/throat swelling, SOB or lightheadedness with  hypotension: UNKNOWN Has patient had a PCN reaction causing severe rash involving mucus membranes or skin necrosis: No Has patient had a PCN reaction that required hospitalization No Has patient had a PCN reaction occurring within the last 10 years: No If all of the above answers are "NO", then may proceed with Cephalosporin use.     ROS Review of Systems A fourteen system review of systems was performed and found to be positive as per HPI.   Objective:    Physical Exam General:  Well Developed, well nourished, appropriate for stated age.  Neuro:  Alert and oriented,  extra-ocular muscles intact  HEENT:  Normocephalic, atraumatic, neck supple Skin:  no gross rash, warm, pink. Cardiac:  RRR, S1 S2, no murmur Respiratory:  ECTA B/L and A/P, Not using accessory muscles, speaking in full sentences- unlabored. Vascular:  Ext warm, no cyanosis apprec.; no gross edema Psych:  No HI/SI, judgement and insight good, Euthymic mood. Full Affect.   BP (!) 109/58   Pulse (!) 53   Ht 5\' 5"  (1.651 m)   Wt 162 lb 3.2 oz (73.6 kg)   SpO2 94%   BMI 26.99 kg/m  Wt Readings from Last 3 Encounters:  09/13/20 162 lb 3.2 oz (73.6 kg)  08/19/20 159 lb (72.1 kg)  08/01/20 168 lb 12.8 oz (76.6 kg)     Health Maintenance Due  Topic Date Due  . TETANUS/TDAP  08/17/2018    There are no preventive care reminders to display for this patient.  Lab Results  Component Value Date   TSH 2.070 06/19/2020   Lab Results  Component Value Date   WBC 5.6 06/19/2020   HGB 13.4 06/19/2020   HCT 39.0 06/19/2020   MCV 87 06/19/2020   PLT 166 06/19/2020   Lab Results  Component Value Date  NA 138 06/19/2020   K 4.5 06/19/2020   CO2 22 06/19/2020   GLUCOSE 118 (H) 06/19/2020   BUN 13 06/19/2020   CREATININE 0.95 06/19/2020   BILITOT 0.4 06/19/2020   ALKPHOS 100 06/19/2020   AST 20 06/19/2020   ALT 19 06/19/2020   PROT 6.6 06/19/2020   ALBUMIN 4.0 06/19/2020   CALCIUM 9.4 06/19/2020    ANIONGAP 7 08/25/2016   Lab Results  Component Value Date   CHOL 121 06/19/2020   Lab Results  Component Value Date   HDL 55 06/19/2020   Lab Results  Component Value Date   LDLCALC 49 06/19/2020   Lab Results  Component Value Date   TRIG 90 06/19/2020   Lab Results  Component Value Date   CHOLHDL 2.2 06/19/2020   Lab Results  Component Value Date   HGBA1C 6.5 (A) 06/13/2020      Assessment & Plan:   Problem List Items Addressed This Visit      Cardiovascular and Mediastinum   Atrial fibrillation with rapid ventricular response ([place])    -Followed by Cardiology. -Stable, denies any new episodes after medication adjusted by Dr. Debara Pickett -Continue current medication regimen.       Hypertension    -BP stable -Continue current medication regimen. -Follow low sodium diet. -Continue to stay hydrated and as active as possible. -Will continue to monitor.        Other   Hyperlipidemia (Chronic)    -Last lipid panel wnl -Continue current medication regimen. -Follow a heart healthy diet. -Will continue to monitor and plan to recheck lipid panel and hepatic function with [facility].      Prediabetes    -Asymptomatic -Follow a low carbohydrate and glucose diet. -Plan to repeat A1c with [facility].      Mood disorder (HCC)    -Stable, PHQ-9 score of 1 -Discussed with patient reasonable to trial coming off medication in the near future. Patient's father passed away this year and recommend to continue with medication, especially with holiday season approaching. Patient verbalized understanding and agreeable. -Will continue to monitor.      Vitamin D deficiency    -Last Vitamin D wnl -Continue Vitamin D supplement. -Plan to recheck Vit D with [facility].       Other Visit Diagnoses    Need for influenza vaccination    -  Primary   Relevant Orders   Flu vaccine HIGH DOSE PF (Fluzone High dose) (Completed)      No orders of the defined types were placed in this  encounter.   Follow-up: Return in about 4 months (around 01/14/2021) for [facility] and [facility] (lipid panel, cmp, a1c, vit D).   Note:  This note was prepared with assistance of Dragon voice recognition software. Occasional wrong-word or sound-a-like substitutions may have occurred due to the inherent limitations of voice recognition software.   Lorrene Reid, PA-C

## 2020-09-13 NOTE — Assessment & Plan Note (Signed)
-  Last lipid panel wnl -Continue current medication regimen. -Follow a heart healthy diet. -Will continue to monitor and plan to recheck lipid panel and hepatic function with [facility].

## 2020-09-13 NOTE — Assessment & Plan Note (Signed)
-  BP stable -Continue current medication regimen. -Follow low sodium diet. -Continue to stay hydrated and as active as possible. -Will continue to monitor.

## 2020-09-13 NOTE — Assessment & Plan Note (Signed)
-  Followed by Cardiology. -Stable, denies any new episodes after medication adjusted by Dr. Debara Pickett -Continue current medication regimen.

## 2020-09-13 NOTE — Assessment & Plan Note (Signed)
-  Stable, PHQ-9 score of 1 -Discussed with patient reasonable to trial coming off medication in the near future. Patient's father passed away this year and recommend to continue with medication, especially with holiday season approaching. Patient verbalized understanding and agreeable. -Will continue to monitor.

## 2020-09-13 NOTE — Patient Instructions (Addendum)

## 2020-09-13 NOTE — Assessment & Plan Note (Signed)
-  Last Vitamin D wnl -Continue Vitamin D supplement. -Plan to recheck Vit D with [facility].

## 2020-10-03 ENCOUNTER — Encounter: Payer: Self-pay | Admitting: Physician Assistant

## 2020-10-04 ENCOUNTER — Other Ambulatory Visit: Payer: Self-pay | Admitting: Physician Assistant

## 2020-10-04 DIAGNOSIS — F419 Anxiety disorder, unspecified: Secondary | ICD-10-CM

## 2020-10-04 MED ORDER — SERTRALINE HCL 25 MG PO TABS: ORAL_TABLET | ORAL | 0 refills | Status: DC

## 2020-10-11 ENCOUNTER — Other Ambulatory Visit: Payer: Self-pay

## 2020-10-11 ENCOUNTER — Telehealth: Payer: Self-pay

## 2020-10-11 ENCOUNTER — Telehealth: Payer: Self-pay | Admitting: Physician Assistant

## 2020-10-11 ENCOUNTER — Other Ambulatory Visit (INDEPENDENT_AMBULATORY_CARE_PROVIDER_SITE_OTHER): Payer: PPO

## 2020-10-11 DIAGNOSIS — R109 Unspecified abdominal pain: Secondary | ICD-10-CM

## 2020-10-11 DIAGNOSIS — Z8719 Personal history of other diseases of the digestive system: Secondary | ICD-10-CM | POA: Diagnosis not present

## 2020-10-11 LAB — COMPREHENSIVE METABOLIC PANEL
ALT: 14 U/L (ref 0–35)
AST: 18 U/L (ref 0–37)
Albumin: 4.1 g/dL (ref 3.5–5.2)
Alkaline Phosphatase: 84 U/L (ref 39–117)
BUN: 15 mg/dL (ref 6–23)
CO2: 29 mEq/L (ref 19–32)
Calcium: 9.7 mg/dL (ref 8.4–10.5)
Chloride: 100 mEq/L (ref 96–112)
Creatinine, Ser: 0.83 mg/dL (ref 0.40–1.20)
GFR: 70.35 mL/min (ref >60.00)
Glucose, Bld: 165 mg/dL — ABNORMAL HIGH (ref 70–99)
Potassium: 3.8 mEq/L (ref 3.5–5.1)
Sodium: 137 mEq/L (ref 135–145)
Total Bilirubin: 0.4 mg/dL (ref 0.2–1.2)
Total Protein: 7.3 g/dL (ref 6.0–8.3)

## 2020-10-11 LAB — CBC
HCT: 38.3 % (ref 36.0–46.0)
Hemoglobin: 12.8 g/dL (ref 12.0–15.0)
MCHC: 33.3 g/dL (ref 30.0–36.0)
MCV: 84 fl (ref 78.0–100.0)
Platelets: 185 10*3/uL (ref 150.0–400.0)
RBC: 4.55 Mil/uL (ref 3.87–5.11)
RDW: 14.7 % (ref 11.5–15.5)
WBC: 5.1 10*3/uL (ref 4.0–10.5)

## 2020-10-11 NOTE — Telephone Encounter (Signed)
Thank you for contacting the patient after hearing from the primary care office.  I am sorry to hear she is not feeling well. She had an uneventful colonoscopy 2 months ago, so I think something else is going on.  She had diverticulitis about a decade ago when she was under the care of Dr. Benson Norway. Perhaps she has recurrent diverticulitis.  My clinic schedule is full (and sometimes overbooked) for weeks, but further investigation needed sooner.  Please arrange the following.  CBC, CMP CT abdomen and pelvis with oral and IV contrast ASAP. Offer remains open for her to go to the emergency department if needed.  - HD

## 2020-10-11 NOTE — Telephone Encounter (Signed)
Spoke with patient in regards to Dr. Loletha Carrow' recommendations. Advised patient that she will need to come in for labs as soon as she can, even today if she can make it by our lab, she is aware that no appt is necessary. Advised that I will be scheduling her CT scan and will send this information via My Chart, advised that she review it and give me a call/send me a message if she has any questions. Patient verbalized understanding of all information.

## 2020-10-11 NOTE — Telephone Encounter (Signed)
Patient states extreme pain Tuesday with a small BM afterwards. Diarrhea Wednesday and states that she has been uncomfortable since colonoscopy.   She states she has called [facility] that did the Colonoscopy but they advised they were unable to see her until January.   Called Dr. Loletha Carrow office and spoke with [facility] who has appointment available for patient on 10/29/20 at 820am. She is also going to send a message to Dr. Loletha Carrow as well for further advise and contact patient.   Patient is aware of the above and verbalized understanding. AS, CMA

## 2020-10-11 NOTE — Telephone Encounter (Signed)
Received a call from patient's PCP office. They state that patient reached out to them and reported extreme abdominal pain - pain brings her to tears, they stated that she had been having trouble with bowel movements. Reports constipation and then all of a sudden has bad diarrhea. Patient told them that " I have not been right since colonoscopy". Pt had her colonoscopy on 08/19/20. Advised that with the severity of pain that patient will more than likely need to be evaluated in the ED. PCP office states that patient was advised to go to the ED but she chose not too. She is scheduled for your next available appt in 10/29/20. Unable to use APP spots unless it is 48 hours prior to scheduling. Please advise, thank you

## 2020-10-11 NOTE — Telephone Encounter (Signed)
Patient has been scheduled for a CT scan at [facility] on 10/15/20 at 1 pm, must arrive at 12:45 PM, pick up contrast, NPO 4 hours prior, drink 1st bottle at 11:00 AM, 2nd bottle at 12 PM. Will send this information via My Chart.   Spoke with patient, she is aware that I will be sending this information. She states that she is on the way to the lab now and will go to [place] [facility] to pick up contrast.

## 2020-10-15 ENCOUNTER — Ambulatory Visit (HOSPITAL_COMMUNITY)
Admission: RE | Admit: 2020-10-15 | Discharge: 2020-10-15 | Disposition: A | Discharge status: Home / Self Care | Payer: PPO | Source: Ambulatory Visit | Attending: Gastroenterology | Admitting: Gastroenterology

## 2020-10-15 ENCOUNTER — Other Ambulatory Visit: Payer: Self-pay

## 2020-10-15 ENCOUNTER — Encounter (HOSPITAL_COMMUNITY): Payer: Self-pay

## 2020-10-15 DIAGNOSIS — K5730 Diverticulosis of large intestine without perforation or abscess without bleeding: Secondary | ICD-10-CM | POA: Diagnosis not present

## 2020-10-15 DIAGNOSIS — Z8719 Personal history of other diseases of the digestive system: Secondary | ICD-10-CM | POA: Insufficient documentation

## 2020-10-15 DIAGNOSIS — R197 Diarrhea, unspecified: Secondary | ICD-10-CM | POA: Diagnosis not present

## 2020-10-15 DIAGNOSIS — I700 Atherosclerosis of aorta: Secondary | ICD-10-CM | POA: Diagnosis not present

## 2020-10-15 DIAGNOSIS — R109 Unspecified abdominal pain: Secondary | ICD-10-CM

## 2020-10-15 DIAGNOSIS — K6389 Other specified diseases of intestine: Secondary | ICD-10-CM | POA: Diagnosis not present

## 2020-10-15 HISTORY — DX: Essential (primary) hypertension: I10

## 2020-10-15 MED ORDER — IOHEXOL 300 MG/ML  SOLN
100.0000 mL | Freq: Once | INTRAMUSCULAR | Status: AC | PRN
  Administered 2020-10-15: 100 mL via INTRAVENOUS

## 2020-10-16 ENCOUNTER — Other Ambulatory Visit: Payer: Self-pay | Admitting: Physician Assistant

## 2020-10-16 DIAGNOSIS — J309 Allergic rhinitis, unspecified: Secondary | ICD-10-CM

## 2020-10-17 ENCOUNTER — Other Ambulatory Visit: Payer: Self-pay

## 2020-10-17 ENCOUNTER — Ambulatory Visit: Payer: PPO | Admitting: Physician Assistant

## 2020-10-17 MED ORDER — METRONIDAZOLE 500 MG PO TABS: 500.0000 mg | ORAL_TABLET | Freq: Three times a day (TID) | ORAL | 0 refills | Status: AC

## 2020-10-17 MED ORDER — CIPROFLOXACIN HCL 500 MG PO TABS: 500.0000 mg | ORAL_TABLET | Freq: Two times a day (BID) | ORAL | 0 refills | Status: AC

## 2020-10-22 ENCOUNTER — Ambulatory Visit: Payer: PPO | Admitting: Internal Medicine

## 2020-10-22 ENCOUNTER — Encounter: Payer: Self-pay | Admitting: Internal Medicine

## 2020-10-22 ENCOUNTER — Other Ambulatory Visit: Payer: Self-pay

## 2020-10-22 VITALS — BP 125/63 | HR 55 | Temp 96.8°F | Ht 65.0 in | Wt 161.4 lb

## 2020-10-22 DIAGNOSIS — Z5181 Encounter for therapeutic drug level monitoring: Secondary | ICD-10-CM | POA: Diagnosis not present

## 2020-10-22 DIAGNOSIS — Z79899 Other long term (current) drug therapy: Secondary | ICD-10-CM | POA: Diagnosis not present

## 2020-10-22 DIAGNOSIS — I700 Atherosclerosis of aorta: Secondary | ICD-10-CM

## 2020-10-22 DIAGNOSIS — I480 Paroxysmal atrial fibrillation: Secondary | ICD-10-CM | POA: Diagnosis not present

## 2020-10-22 NOTE — Progress Notes (Signed)
OFFICE NOTE  Chief Complaint:  No complaints  Primary Care Physician: Lorrene Reid, PA-C  ID:  Daisy Nelson, DOB 08-24-1946, MRN [id number]  PCP:  Lorrene Reid, PA-C  Primary Cardiologist:  Raef Sprigg     History of Present Illness: Daisy Nelson is a 74 y.o. female no prior history of CAD or AF, presented to the ER 3/10 around 3am with complaints of jaw pain, palpitations, and SOB. Her symptoms woke her from sleep around 12:30am. In the ER she was noted to be in Rapid AF. Her EKG shows diffuse ST depression. Initial Troponin is negative. Her symptoms improved after Diltiazem IV was started. She denied any prior history of similar symptoms in the past. She did take Mucinex D earlier this week for sinus infection.   The patient was admitted to [facility] and started on diltiazem, heparin, ASA, beta blocker and nitrates. She spontaneously converted to NSR and ruled out for MI. Lexiscan myovew revealed normal LVF, no ischemia and fixed defects involving the apical segments of the inferior septum and inferior lateral walls. 2D echo confirmed normal EF of 55-60%. Mild TR. CHADSVSC= 1. She was started on Xarelto.  Lopressor was continued.   She was last seen by Tarri Fuller, PA-C, as she was about to have cataract surgery.  It went very well for her and she can now see clearly.  She reports only an accessional fluttering in her chest.  I did decrease her lopressor to 12.5 mg bid during the last visit because she was having a HR in the 40's.  She otherwise denies  nausea, vomiting, fever, chest pain, shortness of breath, orthopnea, dizziness, PND, cough, congestion, abdominal pain, hematochezia, melena, lower extremity edema.  Daisy Nelson back in the office today. She is currently without complaints. She is active denies any chest pain or shortness of breath. She very rarely gets palpitations the last only for a few seconds. She denies any recurrent atrial fibrillation and her EKG today sinus  rhythm. She's had no bleeding problems on aspirin. She had laboratory work in October which shows a well controlled lipid profile on pravastatin. Weight is stable  I saw Daisy Nelson back today in the office. Overall she is doing very well. She denies any chest pain or shortness of breath. She's had no recurrent A. Fib episodes. She has not needed to take her flecainide as needed. She continues on Xarelto without any bleeding problems. Blood pressure is well-controlled today.  01/18/2017  Daisy Nelson returns today for follow-up. She reports no significant recurrent atrial fibrillation. EKG today shows sinus bradycardia with nonspecific ST changes. She is tolerating Xarelto without any bleeding problems. She has not needed the flecainide pocket pill strategy, but is due for a refill. QTC is 395 ms.  01/11/2018  Daisy Nelson was seen today in follow-up.  Overall she reports doing well.  She denies any significant recurrent atrial fibrillation.  She occasionally feels palpitations.  She does not do active heart rate monitoring.  We discussed the [employer]ive[employer] application as well as the newest iteration of the apple watch, both of which can monitor heart rhythms.  He is tolerating Xarelto without bleeding problems.  She has not required flecainide.  She may need a new prescription as a drug could be older and she should check back with [place] when she looks at her bottle.  06/29/2019  [name] is seen today in follow-up.  Overall she continues to do well.  She says last summer she had one  episode of what she thought was A. fib and took 2 flecainide at night and went to sleep.  She says she woke up and she felt fine without any recurrence.  She continues to monitor heart rate and has not noted any recurrent A. fib.  She denies any bleeding complications on Xarelto.  Overall energy level is good.  She is followed by her PCP closely and recently did a virtual visit.  Blood pressures well controlled  today.  07/18/2020  [name] is seen today in follow-up.  Overall she is doing well.  She says that recently though she has had more episodes of atrial fibrillation.  She had an episode in December which responded to pocket pill flecainide.  Since then she has had 3 other episodes and they generally last less than 24 hours and seem to respond to flecainide but she feels drained for at least the next 24 to 48 hours.  Her husband was diagnosed with multivessel coronary disease this spring as well and underwent bypass surgery and he is doing better.  She does have a screening colonoscopy scheduled for September.  10/22/2020  [name] returns today for follow-up.  Reports that she recently developed diverticulitis episode shortly after having her colonoscopy.  She is currently on Cipro and Flagyl, however her gastroenterologist may not have known she was taking flecainide.  This can interact with Cipro causing QT prolongation.  Fortunately her EKG is stable today showing sinus bradycardia with a QTC of 434 ms.  She denies any chest pain.  She is not actually had repeat stress testing since a Myoview stress test while hospitalized in 2014.  Recent imaging of the abdomen showed aortic atherosclerosis.  That being said her blood pressure is well controlled.  She is on high potency statin therapy with well-controlled dyslipidemia and a total cholesterol 121, HDL 55, triglycerides 90 and LDL of 49.    Wt Readings from Last 3 Encounters:  10/22/20 161 lb 6.4 oz (73.2 kg)  09/13/20 162 lb 3.2 oz (73.6 kg)  08/19/20 159 lb (72.1 kg)     Past Medical History:  Diagnosis Date  . AF (atrial fibrillation) ([place])    AF with RVR 01/2013  . Allergy   . Anxiety   . Atrial fibrillation with RVR ([place]) 08/31/2015  . Colon polyps   . Complication of anesthesia 2011   colonscopy- "slow to awaken"  . Depression   . DJD (degenerative joint disease) of cervical spine   . Dysrhythmia    afib  . Family history  of CABG   . GERD (gastroesophageal reflux disease)   . History of kidney stones   . Hyperglycemia   . Hyperlipidemia   . Hypertension   . Jaw pain 08/31/2015  . Pneumonia   . PONV (postoperative nausea and vomiting)   . Wears glasses     Current Outpatient Medications  Medication Sig Dispense Refill  . calcium carbonate (OSCAL) 1500 (600 Ca) MG TABS tablet Take 600 mg of elemental calcium by mouth 2 (two) times daily with a meal.    . cimetidine (TAGAMET) 200 MG tablet Take 200 mg by mouth every evening.     . ciprofloxacin (CIPRO) 500 MG tablet Take 1 tablet (500 mg total) by mouth 2 (two) times daily for 7 days. 14 tablet 0  . fexofenadine (ALLEGRA) 180 MG tablet Take 180 mg by mouth daily.    . flecainide (TAMBOCOR) 50 MG tablet Take 1 tablet (50 mg total) by mouth  2 (two) times daily. 180 tablet 3  . fluticasone (FLONASE) 50 MCG/ACT nasal spray Use 2 spray(s) in each nostril once daily 48 g 0  . hydrOXYzine (ATARAX/VISTARIL) 50 MG tablet Take 1 tablet (50 mg total) by mouth at bedtime. Prn itch (Patient taking differently: Take 50 mg by mouth daily as needed for itching. ) 30 tablet 0  . metoprolol tartrate (LOPRESSOR) 25 MG tablet Take 1/2 (one-half) tablet by mouth twice daily (Patient taking differently: Take 12.5 mg by mouth 2 (two) times daily. ) 90 tablet 3  . metroNIDAZOLE (FLAGYL) 500 MG tablet Take 1 tablet (500 mg total) by mouth 3 (three) times daily for 7 days. 21 tablet 0  . montelukast (SINGULAIR) 10 MG tablet TAKE 1 TABLET BY MOUTH AT BEDTIME (Patient taking differently: Take 10 mg by mouth at bedtime. ) 90 tablet 0  . rivaroxaban (XARELTO) 20 MG TABS tablet Take 1 tablet (20 mg total) by mouth daily with supper. 90 tablet 1  . rosuvastatin (CRESTOR) 20 MG tablet Take 1 tablet (20 mg total) by mouth daily. 90 tablet 3  . sertraline (ZOLOFT) 25 MG tablet Take 4 tablets by mouth once daily x 5 days, then take 3 tablets by mouth once daily x 5 days, then take 2 tablets by  mouth once daily x 5 days, then take 1 tablet by mouth once daily x 5 days. 50 tablet 0  . Vitamin D, Ergocalciferol, (DRISDOL) 1.25 MG (50000 UNIT) CAPS capsule Take 1 capsule by mouth once a week (Patient taking differently: Take 50,000 Units by mouth every 7 (seven) days. ) 12 capsule 0   No current facility-administered medications for this visit.    Allergies:    Allergies  Allergen Reactions  . Adhesive [Tape]     Burn skin  . Erythromycin     Gi intolerance - nausea, vomiting, and abd pains  . Naproxen Nausea And Vomiting    Abdominal pain   . Penicillins Hives    Has patient had a PCN reaction causing immediate rash, facial/tongue/throat swelling, SOB or lightheadedness with hypotension: UNKNOWN Has patient had a PCN reaction causing severe rash involving mucus membranes or skin necrosis: No Has patient had a PCN reaction that required hospitalization No Has patient had a PCN reaction occurring within the last 10 years: No If all of the above answers are "NO", then may proceed with Cephalosporin use.     Social History:  The patient  reports that she quit smoking about 15 years ago. Her smoking use included cigarettes. She started smoking about 57 years ago. She has a 18.00 pack-year smoking history. She has never used smokeless tobacco. She reports that she does not drink alcohol and does not use drugs.   Family history:   Family History  Problem Relation Age of Onset  . Stroke Mother   . Diabetes Father   . Heart disease Father        CABG  . Arthritis Father        C6-7  . Skin cancer Father        SCC  . Arthritis Brother        s/p THR    ROS: A comprehensive review of systems was negative.  HOME MEDS: Current Outpatient Medications  Medication Sig Dispense Refill  . calcium carbonate (OSCAL) 1500 (600 Ca) MG TABS tablet Take 600 mg of elemental calcium by mouth 2 (two) times daily with a meal.    . cimetidine (TAGAMET) 200 MG  tablet Take 200 mg by mouth  every evening.     . ciprofloxacin (CIPRO) 500 MG tablet Take 1 tablet (500 mg total) by mouth 2 (two) times daily for 7 days. 14 tablet 0  . fexofenadine (ALLEGRA) 180 MG tablet Take 180 mg by mouth daily.    . flecainide (TAMBOCOR) 50 MG tablet Take 1 tablet (50 mg total) by mouth 2 (two) times daily. 180 tablet 3  . fluticasone (FLONASE) 50 MCG/ACT nasal spray Use 2 spray(s) in each nostril once daily 48 g 0  . hydrOXYzine (ATARAX/VISTARIL) 50 MG tablet Take 1 tablet (50 mg total) by mouth at bedtime. Prn itch (Patient taking differently: Take 50 mg by mouth daily as needed for itching. ) 30 tablet 0  . metoprolol tartrate (LOPRESSOR) 25 MG tablet Take 1/2 (one-half) tablet by mouth twice daily (Patient taking differently: Take 12.5 mg by mouth 2 (two) times daily. ) 90 tablet 3  . metroNIDAZOLE (FLAGYL) 500 MG tablet Take 1 tablet (500 mg total) by mouth 3 (three) times daily for 7 days. 21 tablet 0  . montelukast (SINGULAIR) 10 MG tablet TAKE 1 TABLET BY MOUTH AT BEDTIME (Patient taking differently: Take 10 mg by mouth at bedtime. ) 90 tablet 0  . rivaroxaban (XARELTO) 20 MG TABS tablet Take 1 tablet (20 mg total) by mouth daily with supper. 90 tablet 1  . rosuvastatin (CRESTOR) 20 MG tablet Take 1 tablet (20 mg total) by mouth daily. 90 tablet 3  . sertraline (ZOLOFT) 25 MG tablet Take 4 tablets by mouth once daily x 5 days, then take 3 tablets by mouth once daily x 5 days, then take 2 tablets by mouth once daily x 5 days, then take 1 tablet by mouth once daily x 5 days. 50 tablet 0  . Vitamin D, Ergocalciferol, (DRISDOL) 1.25 MG (50000 UNIT) CAPS capsule Take 1 capsule by mouth once a week (Patient taking differently: Take 50,000 Units by mouth every 7 (seven) days. ) 12 capsule 0   No current facility-administered medications for this visit.    LABS/IMAGING: No results found for this or any previous visit (from the past 48 hour(s)). No results found.  VITALS: BP 125/63   Pulse (!) 55    Temp (!) 96.8 F (36 C)   Ht 5\' 5"  (1.651 m)   Wt 161 lb 6.4 oz (73.2 kg)   SpO2 94%   BMI 26.86 kg/m   EXAM: General appearance: alert and no distress Neck: no carotid bruit and no JVD Lungs: clear to auscultation bilaterally Heart: regular rate and rhythm, S1, S2 normal, no murmur, click, rub or gallop Abdomen: soft, non-tender; bowel sounds normal; no masses,  no organomegaly Extremities: extremities normal, atraumatic, no cyanosis or edema Pulses: 2+ and symmetric Skin: Skin color, texture, turgor normal. No rashes or lesions Neurologic: Grossly normal Psych: Mood, affect normal  EKG: Sinus bradycardia 55, nonspecific ST changes, QTC 434 ms-personally reviewed  ASSESSMENT: 1. Paroxysmal atrial fibrillation - CHADSVASC score of 1 (female >65) on Xarelto 2. Long-term flecainide therapy 3. Dyslipidemia- at goal 4. Aortic atherosclerosis  PLAN: 1.   Daisy Nelson has done well on daily flecainide therapy.  She recently had a flare of diverticulitis.  She is currently on Cipro and Flagyl however I notified her that the Cipro can affect her QTC.  Unfortunately she is penicillin allergic however may need to consider other options for treatment in the future.  Cholesterol is well treated.  She was shown to have  some aortic atherosclerosis.  I would like to get a baseline exercise treadmill stress test to assess her QRS and QTc with exertion on the flecainide.  Follow-up with me in 6 months or sooner as necessary.  Pixie Casino, MD, [facility], [place] Director of the Advanced Lipid Disorders &  Cardiovascular Risk Reduction Clinic Diplomate of the American Board of Clinical Lipidology Attending Cardiologist  Direct Dial: [phone]  Fax: [phone]  Website:  [email or web address] Caroleann Casler 10/22/2020, 1:46 PM

## 2020-10-22 NOTE — Patient Instructions (Signed)
Medication Instructions:  Your physician recommends that you continue on your current medications as directed. Please refer to the Current Medication list given to you today.  *If you need a refill on your cardiac medications before your next appointment, please call your pharmacy*   Lab Work: You will need to have the coronavirus test completed prior to your procedure. An appointment has been made at ______ on  ________ . This is a Drive Up Visit at the [address]. Please tell them that you are there for pre-procedure testing. Someone will direct you to the appropriate testing line. Stay in your car and someone will be with you shortly. Please make sure to have all other labs completed before this test because you will need to stay quarantined until your procedure. Please take your insurance card to this test.   If you have labs (blood work) drawn today and your tests are completely normal, you will receive your results only by: Marland Kitchen MyChart Message (if you have MyChart) OR . A paper copy in the mail If you have any lab test that is abnormal or we need to change your treatment, we will call you to review the results.   Testing/Procedures: Exercise Tolerance Test @ Dr. Lysbeth Penner office   Follow-Up: At [facility], you and your health needs are our priority.  As part of our continuing mission to provide you with exceptional heart care, we have created designated Provider Care Teams.  These Care Teams include your primary Cardiologist (physician) and Advanced Practice Providers (APPs -  Physician Assistants and Nurse Practitioners) who all work together to provide you with the care you need, when you need it.  We recommend signing up for the patient portal called "MyChart".  Sign up information is provided on this After Visit Summary.  MyChart is used to connect with patients for Virtual Visits (Telemedicine).  Patients are able to view lab/test results, encounter notes,  upcoming appointments, etc.  Non-urgent messages can be sent to your provider as well.   To learn more about what you can do with MyChart, go to [email or web address].    Your next appointment:   6 month(s)  The format for your next appointment:   In Person  Provider:   You may see Dr. Debara Pickett or one of the following Advanced Practice Providers on your designated Care Team:    Almyra Deforest, PA-C  Fabian Sharp, [place] or   Roby Lofts, [place]    Other Instructions

## 2020-10-22 NOTE — Progress Notes (Signed)
Mali,    Thank you for letting me know about the cipro/flecanide QT issue.  I'm surprised the EMR did not flag it as potential interaction. Appreciate you checking the EKG.  I will keep that in mind for the future.  - H. Danis

## 2020-10-28 ENCOUNTER — Other Ambulatory Visit: Payer: Self-pay | Admitting: Physician Assistant

## 2020-10-28 DIAGNOSIS — Z780 Asymptomatic menopausal state: Secondary | ICD-10-CM

## 2020-10-28 DIAGNOSIS — E559 Vitamin D deficiency, unspecified: Secondary | ICD-10-CM

## 2020-10-29 ENCOUNTER — Encounter: Payer: Self-pay | Admitting: Gastroenterology

## 2020-10-29 ENCOUNTER — Ambulatory Visit: Payer: PPO | Admitting: Gastroenterology

## 2020-10-29 VITALS — BP 124/62 | HR 65 | Ht 65.0 in | Wt 159.2 lb

## 2020-10-29 DIAGNOSIS — R1032 Left lower quadrant pain: Secondary | ICD-10-CM | POA: Diagnosis not present

## 2020-10-29 DIAGNOSIS — Z86010 Personal history of colonic polyps: Secondary | ICD-10-CM | POA: Diagnosis not present

## 2020-10-29 DIAGNOSIS — K5732 Diverticulitis of large intestine without perforation or abscess without bleeding: Secondary | ICD-10-CM | POA: Diagnosis not present

## 2020-10-29 NOTE — Patient Instructions (Signed)
If you are age 72 or older, your body mass index should be between 23-30. Your Body mass index is 26.5 kg/m. If this is out of the aforementioned range listed, please consider follow up with your Primary Care Provider.  If you are age 9 or younger, your body mass index should be between 19-25. Your Body mass index is 26.5 kg/m. If this is out of the aformentioned range listed, please consider follow up with your Primary Care Provider.   Follow up as needed.  Thank you for entrusting me with your care and choosing [facility].  Dr. Loletha Carrow

## 2020-10-29 NOTE — Progress Notes (Signed)
[facility] Progress Note  Chief Complaint: Left lower quadrant pain  Subjective  History: I last saw Daisy Nelson for a surveillance colonoscopy 08/19/2020, findings of left-sided diverticulosis with tortuosity and redundancy and multiple adenomatous polyps.  We received a call from her primary care provider on November 12 stating patient had called them with abdominal pain for over a month.  Patient states she had not felt right since her colonoscopy.  She would have episodes of constipation and then diarrhea with severe abdominal pain.  Review of records indicated patient had had diverticulitis when cared for by Dr. Benson Norway in the past, so we obtain lab work and a CT scan.  CT scan report below, suggested subtle pericolonic stranding adjacent to area of sigmoid diverticulosis.  I elected to treat her with a 7-day course of ciprofloxacin and metronidazole.  She saw her cardiologist Dr. Debara Pickett on 10/22/2020, who noted potential interaction between ciprofloxacin and flecainide (can cause QT prolongation).  Daisy Nelson does not have a history of prolonged QT.  EKG at his office that day was normal, and patient was nearly done with the antibiotic course by then.   Daisy Nelson tells me her symptoms started soon after the colonoscopy has a dull bandlike lower abdominal ache that would come and go.  It then worsened in the several days prior to hearing from her.  There was no bleeding or vomiting. She has felt well since finishing the antibiotics and was taking a probiotic as well during that time.  She had not been having any change in bowel habits or abdominal pain prior to the colonoscopy.  ROS: Cardiovascular:  no chest pain Respiratory: no dyspnea  The patient's Past Medical, Family and Social History were reviewed and are on file in the EMR.  Objective:  Med list reviewed  Current Outpatient Medications:  .  calcium carbonate (OSCAL) 1500 (600 Ca) MG TABS tablet, Take 600 mg of elemental calcium by  mouth 2 (two) times daily with a meal., Disp: , Rfl:  .  fexofenadine (ALLEGRA) 180 MG tablet, Take 180 mg by mouth daily., Disp: , Rfl:  .  flecainide (TAMBOCOR) 50 MG tablet, Take 1 tablet (50 mg total) by mouth 2 (two) times daily., Disp: 180 tablet, Rfl: 3 .  fluticasone (FLONASE) 50 MCG/ACT nasal spray, Use 2 spray(s) in each nostril once daily, Disp: 48 g, Rfl: 0 .  metoprolol tartrate (LOPRESSOR) 25 MG tablet, Take 1/2 (one-half) tablet by mouth twice daily (Patient taking differently: Take 12.5 mg by mouth 2 (two) times daily. ), Disp: 90 tablet, Rfl: 3 .  rivaroxaban (XARELTO) 20 MG TABS tablet, Take 1 tablet (20 mg total) by mouth daily with supper., Disp: 90 tablet, Rfl: 1 .  rosuvastatin (CRESTOR) 20 MG tablet, Take 1 tablet (20 mg total) by mouth daily., Disp: 90 tablet, Rfl: 3 .  Vitamin D, Ergocalciferol, (DRISDOL) 1.25 MG (50000 UNIT) CAPS capsule, Take 1 capsule by mouth once a week, Disp: 12 capsule, Rfl: 0 .  cimetidine (TAGAMET) 200 MG tablet, Take 200 mg by mouth every evening.  (Patient not taking: Reported on 10/29/2020), Disp: , Rfl:  .  montelukast (SINGULAIR) 10 MG tablet, TAKE 1 TABLET BY MOUTH AT BEDTIME (Patient not taking: Reported on 10/29/2020), Disp: 90 tablet, Rfl: 0   Vital signs in last 24 hrs: Vitals:   10/29/20 0812  BP: 124/62  Pulse: 65    Physical Exam  Well-appearing  HEENT: sclera anicteric, oral mucosa moist without lesions  Neck: supple,  no thyromegaly, JVD or lymphadenopathy  Cardiac: RRR without murmurs, S1S2 heard, no peripheral edema  Pulm: clear to auscultation bilaterally, normal RR and effort noted  Abdomen: soft, no tenderness, with active bowel sounds. No guarding or palpable hepatosplenomegaly.  Skin; warm and dry, no jaundice or rash  Labs:  CBC Latest Ref Rng & Units 10/11/2020 06/19/2020 06/12/2019  WBC 4.0 - 10.5 K/uL 5.1 5.6 4.6  Hemoglobin 12.0 - 15.0 g/dL 12.8 13.4 13.2  Hematocrit 36 - 46 % 38.3 39.0 40.0  Platelets  150 - 400 K/uL 185.0 166 159   CMP Latest Ref Rng & Units 10/11/2020 06/19/2020 06/12/2019  Glucose 70 - 99 mg/dL 165(H) 118(H) 123(H)  BUN 6 - 23 mg/dL 15 13 13   Creatinine 0.40 - 1.20 mg/dL 0.83 0.95 0.87  Sodium 135 - 145 mEq/L 137 138 140  Potassium 3.5 - 5.1 mEq/L 3.8 4.5 4.5  Chloride 96 - 112 mEq/L 100 102 101  CO2 19 - 32 mEq/L 29 22 24   Calcium 8.4 - 10.5 mg/dL 9.7 9.4 9.2  Total Protein 6.0 - 8.3 g/dL 7.3 6.6 6.4  Total Bilirubin 0.2 - 1.2 mg/dL 0.4 0.4 0.4  Alkaline Phos 39 - 117 U/L 84 100 99  AST 0 - 37 U/L 18 20 24   ALT 0 - 35 U/L 14 19 28     ___________________________________________ Radiologic studies:  CLINICAL DATA:  Lower abdominal discomfort since colonoscopy 1 year ago. Diarrhea. Hysterectomy. History of diverticulitis.   EXAM: CT ABDOMEN AND PELVIS WITH CONTRAST   TECHNIQUE: Multidetector CT imaging of the abdomen and pelvis was performed using the standard protocol following bolus administration of intravenous contrast.   CONTRAST:  1109mL OMNIPAQUE IOHEXOL 300 MG/ML  SOLN   COMPARISON:  In 1917 plain film.  Most recent CT 06/09/2010   FINDINGS: Lower chest: 3 mm subpleural right lower lobe pulmonary nodule on 15/6 is felt to be present on the prior exam and considered benign. Normal heart size without pericardial or pleural effusion.   Hepatobiliary: Normal liver. Normal gallbladder, without biliary ductal dilatation.   Pancreas: Normal, without mass or ductal dilatation.   Spleen: Normal in size, without focal abnormality.   Adrenals/Urinary Tract: Normal adrenal glands. Normal kidneys, without hydronephrosis. Normal urinary bladder.   Stomach/Bowel: Normal stomach, without wall thickening. Scattered colonic diverticula. Subtle pericolonic interstitial thickening including on transverse image 64 and coronal image 75, adjacent the sigmoid. This is at the site of significant edema back on 06/09/2010.   Normal terminal ileum and appendix.   Normal small bowel.   Vascular/Lymphatic: Aortic atherosclerosis. No abdominopelvic adenopathy.   Reproductive: Hysterectomy.  No adnexal mass.   Other: No significant free fluid. No evidence of omental or peritoneal disease.   Musculoskeletal: No acute osseous abnormality.   IMPRESSION: 1. Colonic diverticulosis. Subtle interstitial thickening adjacent the sigmoid could represent non complicated diverticulitis or scarring from the remote diverticulitis in this region. 2. No other explanation for patient's symptoms. 3.  Aortic atherosclerosis.     Electronically Signed   By: Abigail Miyamoto M.D.   On: 10/15/2020 14:39  ____________________________________________ Other:   _____________________________________________ Assessment & Plan  Assessment: Encounter Diagnoses  Name Primary?  Marland Kitchen LLQ abdominal pain Yes  . Diverticulitis of colon   . Hx of adenomatous colonic polyps    Over a month of abdominal pain that was escalating, time course and nature of symptoms somewhat atypical for diverticulitis, and CT scan findings were subtle.  Nevertheless, CT not always correlative with degree of  symptoms, and given her symptoms and history of diverticulitis, I elected to treat her with a short course of ciprofloxacin and Flagyl.  Symptoms now resolved and hopefully there will be no recurrence.  She feels well at present and will see me as needed.  Recall colonoscopy 3 years for polyps.   30 minutes were spent on this encounter (including chart review, history/exam, counseling/coordination of care, and documentation)  Nelida Meuse III

## 2020-11-01 ENCOUNTER — Encounter (HOSPITAL_COMMUNITY): Payer: Self-pay | Admitting: Emergency Medicine

## 2020-11-01 ENCOUNTER — Ambulatory Visit (HOSPITAL_COMMUNITY)
Admission: EM | Admit: 2020-11-01 | Discharge: 2020-11-01 | Disposition: A | Discharge status: Home / Self Care | Payer: PPO | Attending: Emergency Medicine | Admitting: Emergency Medicine

## 2020-11-01 ENCOUNTER — Other Ambulatory Visit: Payer: Self-pay

## 2020-11-01 DIAGNOSIS — R197 Diarrhea, unspecified: Secondary | ICD-10-CM | POA: Diagnosis not present

## 2020-11-01 DIAGNOSIS — Z20822 Contact with and (suspected) exposure to covid-19: Secondary | ICD-10-CM | POA: Diagnosis not present

## 2020-11-01 LAB — RESP PANEL BY RT-PCR (FLU A&B, COVID) ARPGX2
Influenza A by PCR: NEGATIVE
Influenza B by PCR: NEGATIVE
SARS Coronavirus 2 by RT PCR: NEGATIVE

## 2020-11-01 MED ORDER — DICYCLOMINE HCL 10 MG PO CAPS: 10.0000 mg | ORAL_CAPSULE | Freq: Three times a day (TID) | ORAL | 0 refills | Status: DC

## 2020-11-01 NOTE — ED Triage Notes (Signed)
Pt c/o diarrhea x 1 day. She states it began yesterday around 10 am. Pt states she could not leave her house due to frequent bowel movements. Pt states she recently took antibiotics for a diverticulitis flare-up.

## 2020-11-01 NOTE — ED Provider Notes (Signed)
____________________________________________  Time seen: Approximately 5:43 PM  I have reviewed the triage vital signs and the nursing notes.   HISTORY  Chief Complaint Diarrhea and Fever   Historian Patient    HPI Daisy Nelson is a 74 y.o. female presents to the urgent care with watery, malodorous diarrhea for the past 2 days.  Patient states that she just recently finished a course of ciprofloxacin and Flagyl for diverticulitis.  She reports that her abdominal pain has resolved.  Associated symptoms include headache.  Patient denies fever and chills.  No chest pain, chest tightness or current nausea.  Patient denies history of C. difficile infection in the past.  No recent travel.  No other alleviating measures have been attempted.   Past Medical History:  Diagnosis Date  . AF (atrial fibrillation) ([place])    AF with RVR 01/2013  . Allergy   . Anxiety   . Atrial fibrillation with RVR ([place]) 08/31/2015  . Colon polyps   . Complication of anesthesia 2011   colonscopy- "slow to awaken"  . Depression   . DJD (degenerative joint disease) of cervical spine   . Dysrhythmia    afib  . Family history of CABG   . GERD (gastroesophageal reflux disease)   . History of kidney stones   . Hyperglycemia   . Hyperlipidemia   . Hypertension   . Jaw pain 08/31/2015  . Pneumonia   . PONV (postoperative nausea and vomiting)   . Wears glasses      Immunizations up to date:  Yes.     Past Medical History:  Diagnosis Date  . AF (atrial fibrillation) ([place])    AF with RVR 01/2013  . Allergy   . Anxiety   . Atrial fibrillation with RVR ([place]) 08/31/2015  . Colon polyps   . Complication of anesthesia 2011   colonscopy- "slow to awaken"  . Depression   . DJD (degenerative joint disease) of cervical spine   . Dysrhythmia    afib  . Family history of CABG   . GERD (gastroesophageal reflux disease)   . History of kidney stones   . Hyperglycemia   . Hyperlipidemia   .  Hypertension   . Jaw pain 08/31/2015  . Pneumonia   . PONV (postoperative nausea and vomiting)   . Wears glasses     Patient Active Problem List   Diagnosis Date Noted  . Vitamin D deficiency 12/14/2019  . Hypertension 06/12/2019  . Environmental and seasonal allergies 10/24/2018  . hx tob abuse;  30 yrs at roughly 1/2 ppd 05/11/2018  . Drug-induced bradycardia-  rate controlled on metoprolol by cards, patient ASx 05/10/2018  . Mood disorder ([place]) 05/10/2018  . Preretinal fibrosis, right eye 08/25/2016  . Prediabetes 03/26/2015  . PAF (paroxysmal atrial fibrillation) ([place]) 07/10/2013  . Atrial fibrillation with rapid ventricular response ([place]) 02/06/2013  . Family history of coronary artery bypass surgery 02/06/2013  . GERD (gastroesophageal reflux disease) 10/25/2012  . Allergic rhinitis 10/25/2012  . Hyperlipidemia 10/25/2012  . Anxiety 10/25/2012  . DJD (degenerative joint disease) of cervical spine 10/25/2012  . Nephrolithiasis 10/25/2012  . Colon polyps 10/25/2012  . Complication of anesthesia 11/30/2009    Past Surgical History:  Procedure Laterality Date  . [place] VITRECTOMY WITH 20 GAUGE MVR PORT FOR MACULAR HOLE Right 08/25/2016   Procedure: 25 GAUGE PARS PLANA VITRECTOMY; RIGHT EYE;  Surgeon: [name], MD;  Location: [facility];  Service: Ophthalmology;  Laterality: Right;  .  ABDOMINAL HYSTERECTOMY  1991  . CATARACT EXTRACTION W/ INTRAOCULAR LENS  IMPLANT, BILATERAL Bilateral 03/2013  . CESAREAN SECTION  1976  . COLONOSCOPY  2011   polyps  . COLONOSCOPY WITH PROPOFOL N/A 08/19/2020   Procedure: COLONOSCOPY WITH PROPOFOL;  Surgeon: Doran Stabler, MD;  Location: [facility];  Service: Gastroenterology;  Laterality: N/A;  . EYE SURGERY    . GANGLION CYST EXCISION Left    hand  . GAS/FLUID EXCHANGE Right 08/25/2016   Procedure: GAS/FLUID EXCHANGE RIGHT EYE;  Surgeon: [name], MD;  Location: [facility];  Service: Ophthalmology;  Laterality: Right;   . IRIDECTOMY Right 08/25/2016   Procedure: PERIPHERAL IRIDECTOMY RIGHT EYE;  Surgeon: [name], MD;  Location: [facility];  Service: Ophthalmology;  Laterality: Right;  . LAPAROSCOPY  1976  . MEMBRANE PEEL Right 08/25/2016   Procedure: MEMBRANE PEEL RIGHT EYE;  Surgeon: [name], MD;  Location: [facility];  Service: Ophthalmology;  Laterality: Right;  . NM MYOCAR PERF WALL MOTION  01/2013   lexiscan - no pharmacologically induced ischemia, fixed defects in apical segments of anterior septum & inferior lateral wall   . PARS PLANA VITRECTOMY Right 08/25/2016   laser, gas injection, AC wash out. Membrane peel right eye  . POLYPECTOMY N/A 08/19/2020   Procedure: POLYPECTOMY;  Surgeon: Doran Stabler, MD;  Location: [facility];  Service: Gastroenterology;  Laterality: N/A;  . TRANSTHORACIC ECHOCARDIOGRAM  01/2013   EF 55-60%, mod conc LVH; mild MR with mildly thickened leaflets; mild TR; PA peak pressure 16mmHg  . TUBAL LIGATION  1977    Prior to Admission medications   Medication Sig Start Date End Date Taking? Authorizing Provider  calcium carbonate (OSCAL) 1500 (600 Ca) MG TABS tablet Take 600 mg of elemental calcium by mouth 2 (two) times daily with a meal.    [provider]  cimetidine (TAGAMET) 200 MG tablet Take 200 mg by mouth every evening.  Patient not taking: Reported on 10/29/2020    [provider]  dicyclomine (BENTYL) 10 MG capsule Take 1 capsule (10 mg total) by mouth 4 (four) times daily -  before meals and at bedtime for 5 days. 11/01/20 11/06/20  Lannie Fields, PA-C  fexofenadine (ALLEGRA) 180 MG tablet Take 180 mg by mouth daily.    [provider]  flecainide (TAMBOCOR) 50 MG tablet Take 1 tablet (50 mg total) by mouth 2 (two) times daily. 07/18/20   Hilty, Nadean Corwin, MD  fluticasone (FLONASE) 50 MCG/ACT nasal spray Use 2 spray(s) in each nostril once daily 10/16/20   Abonza, Maritza, PA-C  metoprolol tartrate (LOPRESSOR) 25 MG tablet Take  1/2 (one-half) tablet by mouth twice daily Patient taking differently: Take 12.5 mg by mouth 2 (two) times daily.  07/18/20   Hilty, Nadean Corwin, MD  montelukast (SINGULAIR) 10 MG tablet TAKE 1 TABLET BY MOUTH AT BEDTIME Patient not taking: Reported on 10/29/2020 07/10/20   Lorrene Reid, PA-C  rivaroxaban (XARELTO) 20 MG TABS tablet Take 1 tablet (20 mg total) by mouth daily with supper. 07/18/20   Hilty, Nadean Corwin, MD  rosuvastatin (CRESTOR) 20 MG tablet Take 1 tablet (20 mg total) by mouth daily. 07/18/20   Hilty, Nadean Corwin, MD  Vitamin D, Ergocalciferol, (DRISDOL) 1.25 MG (50000 UNIT) CAPS capsule Take 1 capsule by mouth once a week 10/28/20   Lorrene Reid, PA-C  atorvastatin (LIPITOR) 40 MG tablet Take 1 tablet (40 mg total) by mouth at bedtime. ( no RF  until bldwrk done) 03/07/19   Opalski, Neoma Laming, DO    Allergies Adhesive [tape], Erythromycin, Naproxen, and Penicillins  Family History  Problem Relation Age of Onset  . Stroke Mother   . Diabetes Father   . Heart disease Father        CABG  . Arthritis Father        C6-7  . Skin cancer Father        SCC  . Arthritis Brother        s/p THR    Social History Social History   Tobacco Use  . Smoking status: Former Smoker    Packs/day: 0.50    Years: 36.00    Pack years: 18.00    Types: Cigarettes    Start date: 03/15/1963    Quit date: 01/08/2005    Years since quitting: 15.8  . Smokeless tobacco: Never Used  Vaping Use  . Vaping Use: Never used  Substance Use Topics  . Alcohol use: No  . Drug use: No     Review of Systems  Constitutional: No fever/chills Eyes:  No discharge ENT: No upper respiratory complaints. Respiratory: no cough. No SOB/ use of accessory muscles to breath Gastrointestinal: Patient has diarrhea.  Musculoskeletal: Negative for musculoskeletal pain. Skin: Negative for rash, abrasions, lacerations, ecchymosis.   ____________________________________________   PHYSICAL EXAM:  VITAL SIGNS: ED  Triage Vitals [11/01/20 1715]  Enc Vitals Group     BP 123/63     Pulse Rate 63     Resp 18     Temp 99.2 F (37.3 C)     Temp Source Oral     SpO2 97 %     Weight      Height      Head Circumference      Peak Flow      Pain Score 0     Pain Loc      Pain Edu?      Excl. in [place]?      Constitutional: Alert and oriented. Well appearing and in no acute distress. Eyes: Conjunctivae are normal. PERRL. EOMI. Head: Atraumatic. Cardiovascular: Normal rate, regular rhythm. Normal S1 and S2.  Good peripheral circulation. Respiratory: Normal respiratory effort without tachypnea or retractions. Lungs CTAB. Good air entry to the bases with no decreased or absent breath sounds Gastrointestinal: Bowel sounds x 4 quadrants. Soft and nontender to palpation. No guarding or rigidity. No distention. Musculoskeletal: Full range of motion to all extremities. No obvious deformities noted Neurologic:  Normal for age. No gross focal neurologic deficits are appreciated.  Skin:  Skin is warm, dry and intact. No rash noted. Psychiatric: Mood and affect are normal for age. Speech and behavior are normal.   ____________________________________________   LABS (all labs ordered are listed, but only abnormal results are displayed)  Labs Reviewed  C DIFFICILE QUICK SCREEN W PCR REFLEX  RESP PANEL BY RT-PCR (FLU A&B, COVID) [id number]   ____________________________________________  EKG   ____________________________________________  RADIOLOGY   No results found.  ____________________________________________    PROCEDURES  Procedure(s) performed:     Procedures     Medications - No data to display   ____________________________________________   INITIAL IMPRESSION / ASSESSMENT AND PLAN / ED COURSE  Pertinent labs & imaging results that were available during my care of the patient were reviewed by me and considered in my medical decision making (see chart for details).       Assessment and plan: Diarrhea 74 year old female presents to the  urgent care with nonbloody diarrhea for the past 2 days after stopping ciprofloxacin.  Vital signs were reassuring at triage.  On physical exam, patient was alert and oriented but did seem fatigued.  Abdomen was soft and nontender without guarding.  Differential diagnosis includes C. difficile, unspecified gastroenteritis, medication side effect, COVID-19....  PCR for C. difficile is in process at this time.  Testing for flu and COVID-19 are in process at this time as well.  Encourage hydration at home and patient was discharged with a short course of Bentyl.  Return precautions were given to return with new or worsening symptoms.    ____________________________________________  FINAL CLINICAL IMPRESSION(S) / ED DIAGNOSES  Final diagnoses:  Diarrhea, unspecified type      NEW MEDICATIONS STARTED DURING THIS VISIT:  ED Discharge Orders         Ordered    dicyclomine (BENTYL) 10 MG capsule  3 times daily before meals & bedtime        11/01/20 1757              This chart was dictated using voice recognition software/Dragon. Despite best efforts to proofread, errors can occur which can change the meaning. Any change was purely unintentional.     Lannie Fields, PA-C 11/01/20 2019

## 2020-11-01 NOTE — Discharge Instructions (Signed)
Please await C. difficile testing results at home. Your COVID-19 and flu testing are also in process. Please try to stay hydrated with Gatorade. You have been prescribed Bentyl for abdominal spasms.

## 2020-11-02 LAB — CLOSTRIDIUM DIFFICILE BY PCR, REFLEXED: Toxigenic C. Difficile by PCR: POSITIVE — AB

## 2020-11-02 LAB — C DIFFICILE QUICK SCREEN W PCR REFLEX
C Diff antigen: POSITIVE — AB
C Diff toxin: NEGATIVE

## 2020-11-04 ENCOUNTER — Telehealth (HOSPITAL_COMMUNITY): Payer: Self-pay | Admitting: Emergency Medicine

## 2020-11-04 NOTE — Telephone Encounter (Signed)
Patient left a voicemail over the weekend with concerns for her positive c-diff results.  Reviewed with Dr. Lanny Cramp, who states probiotic treatment, no abx needed.  Patient updated.  States today her stools have turned loose, but now solid, no longer liquid.  Some abdominal cramping, but improving.  No fevers.  Updated on recommendations and patient verbalized understanding.

## 2020-11-05 ENCOUNTER — Telehealth: Payer: Self-pay

## 2020-11-05 NOTE — Telephone Encounter (Signed)
Received fax request for rosuvastatin.  Refill denied, too early for refills.  Charyl Bigger, CMA

## 2020-11-12 ENCOUNTER — Encounter: Payer: Self-pay | Admitting: Physician Assistant

## 2020-11-12 ENCOUNTER — Ambulatory Visit (INDEPENDENT_AMBULATORY_CARE_PROVIDER_SITE_OTHER): Payer: PPO | Admitting: Physician Assistant

## 2020-11-12 ENCOUNTER — Other Ambulatory Visit: Payer: Self-pay

## 2020-11-12 VITALS — BP 108/62 | HR 49 | Temp 97.5°F | Ht 65.0 in | Wt 159.5 lb

## 2020-11-12 DIAGNOSIS — R7303 Prediabetes: Secondary | ICD-10-CM | POA: Diagnosis not present

## 2020-11-12 DIAGNOSIS — A498 Other bacterial infections of unspecified site: Secondary | ICD-10-CM | POA: Insufficient documentation

## 2020-11-12 DIAGNOSIS — R739 Hyperglycemia, unspecified: Secondary | ICD-10-CM

## 2020-11-12 LAB — POCT GLYCOSYLATED HEMOGLOBIN (HGB A1C): Hemoglobin A1C: 6.3 % — AB (ref 4.0–5.6)

## 2020-11-12 NOTE — Progress Notes (Signed)
Established Patient Office Visit  Subjective:  Patient ID: Daisy Nelson, female    DOB: 03-27-1946  Age: 74 y.o. MRN: [id number]  CC:  Chief Complaint  Patient presents with  . c diff    HPI Daisy Nelson presents for urgent care follow-up  Daisy Nelson was diagnosed with C. Diff colitis 11 days ago on 11/01/20 She presented to UC with loose, watery stools/diarrhea following completion of Cipro/Flagyl for acute diverticulitis PCR was positive for toxigenic C . Diff with little to no toxin production Per Dr. Lanny Cramp, antibiotics were not indicated, and probiotics were recommended No prior hx of C. Diff Last colonoscopy was 07/2020 - reports polypectomy  GI is Dr. Wilfrid Lund  She states she has not had diarrhea for the last 6 days. She is stooling normally. Reports there is some residual low abdominal discomfort/soreness Reports she has resumed a normal diet gradually  Denies fever, chills, nausea/vomiting, hematochezia, melena  She did not start an oral probiotic, but has been doing Activia yogurt drinks  Past Medical History:  Diagnosis Date  . AF (atrial fibrillation) ([place])    AF with RVR 01/2013  . Allergy   . Anxiety   . Atrial fibrillation with RVR ([place]) 08/31/2015  . Colon polyps   . Complication of anesthesia 2011   colonscopy- "slow to awaken"  . Depression   . DJD (degenerative joint disease) of cervical spine   . Dysrhythmia    afib  . Family history of CABG   . GERD (gastroesophageal reflux disease)   . History of kidney stones   . Hyperglycemia   . Hyperlipidemia   . Hypertension   . Jaw pain 08/31/2015  . Pneumonia   . PONV (postoperative nausea and vomiting)   . Wears glasses     Past Surgical History:  Procedure Laterality Date  . [place] VITRECTOMY WITH 20 GAUGE MVR PORT FOR MACULAR HOLE Right 08/25/2016   Procedure: 25 GAUGE PARS PLANA VITRECTOMY; RIGHT EYE;  Surgeon: [name], MD;  Location: [facility];  Service: Ophthalmology;   Laterality: Right;  . ABDOMINAL HYSTERECTOMY  1991  . CATARACT EXTRACTION W/ INTRAOCULAR LENS  IMPLANT, BILATERAL Bilateral 03/2013  . CESAREAN SECTION  1976  . COLONOSCOPY  2011   polyps  . COLONOSCOPY WITH PROPOFOL N/A 08/19/2020   Procedure: COLONOSCOPY WITH PROPOFOL;  Surgeon: Doran Stabler, MD;  Location: [facility];  Service: Gastroenterology;  Laterality: N/A;  . EYE SURGERY    . GANGLION CYST EXCISION Left    hand  . GAS/FLUID EXCHANGE Right 08/25/2016   Procedure: GAS/FLUID EXCHANGE RIGHT EYE;  Surgeon: [name], MD;  Location: [facility];  Service: Ophthalmology;  Laterality: Right;  . IRIDECTOMY Right 08/25/2016   Procedure: PERIPHERAL IRIDECTOMY RIGHT EYE;  Surgeon: [name], MD;  Location: [facility];  Service: Ophthalmology;  Laterality: Right;  . LAPAROSCOPY  1976  . MEMBRANE PEEL Right 08/25/2016   Procedure: MEMBRANE PEEL RIGHT EYE;  Surgeon: [name], MD;  Location: [facility];  Service: Ophthalmology;  Laterality: Right;  . NM MYOCAR PERF WALL MOTION  01/2013   lexiscan - no pharmacologically induced ischemia, fixed defects in apical segments of anterior septum & inferior lateral wall   . PARS PLANA VITRECTOMY Right 08/25/2016   laser, gas injection, AC wash out. Membrane peel right eye  . POLYPECTOMY N/A 08/19/2020   Procedure: POLYPECTOMY;  Surgeon: Doran Stabler, MD;  Location: [facility];  Service:  Gastroenterology;  Laterality: N/A;  . TRANSTHORACIC ECHOCARDIOGRAM  01/2013   EF 55-60%, mod conc LVH; mild MR with mildly thickened leaflets; mild TR; PA peak pressure 11mmHg  . TUBAL LIGATION  1977    Family History  Problem Relation Age of Onset  . Stroke Mother   . Diabetes Father   . Heart disease Father        CABG  . Arthritis Father        C6-7  . Skin cancer Father        SCC  . Arthritis Brother        s/p THR    Social History   Socioeconomic History  . Marital status: Married    Spouse name: Herbie Baltimore  . Number of children: 1  .  Years of education: 97  . Highest education level: Not on file  Occupational History  . Occupation: Medical illustrator  Tobacco Use  . Smoking status: Former Smoker    Packs/day: 0.50    Years: 36.00    Pack years: 18.00    Types: Cigarettes    Start date: 03/15/1963    Quit date: 01/08/2005    Years since quitting: 15.8  . Smokeless tobacco: Never Used  Vaping Use  . Vaping Use: Never used  Substance and Sexual Activity  . Alcohol use: No  . Drug use: No  . Sexual activity: Yes    Partners: Male    Birth control/protection: Surgical  Other Topics Concern  . Not on file  Social History Narrative   Lives with her husband and their cat, Elvis Coil.   Social Determinants of Health   Financial Resource Strain: Not on file  Food Insecurity: Not on file  Transportation Needs: Not on file  Physical Activity: Not on file  Stress: Not on file  Social Connections: Not on file  Intimate Partner Violence: Not on file    Outpatient Medications Prior to Visit  Medication Sig Dispense Refill  . calcium carbonate (OSCAL) 1500 (600 Ca) MG TABS tablet Take 600 mg of elemental calcium by mouth 2 (two) times daily with a meal.    . cimetidine (TAGAMET) 200 MG tablet Take 200 mg by mouth every evening.    . fexofenadine (ALLEGRA) 180 MG tablet Take 180 mg by mouth daily.    . flecainide (TAMBOCOR) 50 MG tablet Take 1 tablet (50 mg total) by mouth 2 (two) times daily. 180 tablet 3  . fluticasone (FLONASE) 50 MCG/ACT nasal spray Use 2 spray(s) in each nostril once daily 48 g 0  . metoprolol tartrate (LOPRESSOR) 25 MG tablet Take 1/2 (one-half) tablet by mouth twice daily (Patient taking differently: Take 12.5 mg by mouth 2 (two) times daily.) 90 tablet 3  . montelukast (SINGULAIR) 10 MG tablet TAKE 1 TABLET BY MOUTH AT BEDTIME 90 tablet 0  . rivaroxaban (XARELTO) 20 MG TABS tablet Take 1 tablet (20 mg total) by mouth daily with supper. 90 tablet 1  . rosuvastatin (CRESTOR) 20 MG tablet Take 1  tablet (20 mg total) by mouth daily. 90 tablet 3  . Vitamin D, Ergocalciferol, (DRISDOL) 1.25 MG (50000 UNIT) CAPS capsule Take 1 capsule by mouth once a week 12 capsule 0  . dicyclomine (BENTYL) 10 MG capsule Take 1 capsule (10 mg total) by mouth 4 (four) times daily -  before meals and at bedtime for 5 days. 15 capsule 0   No facility-administered medications prior to visit.    Allergies  Allergen Reactions  .  Adhesive [Tape]     Burn skin  . Erythromycin     Gi intolerance - nausea, vomiting, and abd pains  . Naproxen Nausea And Vomiting    Abdominal pain   . Penicillins Hives    Has patient had a PCN reaction causing immediate rash, facial/tongue/throat swelling, SOB or lightheadedness with hypotension: UNKNOWN Has patient had a PCN reaction causing severe rash involving mucus membranes or skin necrosis: No Has patient had a PCN reaction that required hospitalization No Has patient had a PCN reaction occurring within the last 10 years: No If all of the above answers are "NO", then may proceed with Cephalosporin use.     ROS Review of Systems  Constitutional: Negative for activity change, appetite change, fatigue and fever.  Cardiovascular: Negative for chest pain.       Bradycardia   Gastrointestinal: Positive for abdominal pain. Negative for anal bleeding, blood in stool, constipation, diarrhea, nausea, rectal pain and vomiting.  All other systems reviewed and are negative.     Objective:    Physical Exam Vitals reviewed.  Constitutional:      Appearance: Normal appearance.  Cardiovascular:     Rate and Rhythm: Regular rhythm. Bradycardia present.     Heart sounds: Normal heart sounds.  Pulmonary:     Effort: Pulmonary effort is normal.     Breath sounds: Normal breath sounds.  Abdominal:     General: Abdomen is flat. Bowel sounds are normal.     Palpations: Abdomen is soft.     Tenderness: There is no abdominal tenderness. There is no guarding.  Neurological:      Mental Status: She is alert.     BP 108/62   Pulse (!) 49   Temp (!) 97.5 F (36.4 C) (Oral)   Ht 5\' 5"  (1.651 m)   Wt 159 lb 8 oz (72.3 kg)   SpO2 98% Comment: on RA  BMI 26.54 kg/m  Wt Readings from Last 3 Encounters:  11/12/20 159 lb 8 oz (72.3 kg)  10/29/20 159 lb 4 oz (72.2 kg)  10/22/20 161 lb 6.4 oz (73.2 kg)     Health Maintenance Due  Topic Date Due  . TETANUS/TDAP  08/17/2018  . COVID-19 Vaccine (3 - Booster for Pfizer series) 08/15/2020    There are no preventive care reminders to display for this patient.  Lab Results  Component Value Date   TSH 2.070 06/19/2020   Lab Results  Component Value Date   WBC 5.1 10/11/2020   HGB 12.8 10/11/2020   HCT 38.3 10/11/2020   MCV 84.0 10/11/2020   PLT 185.0 10/11/2020   Lab Results  Component Value Date   NA 137 10/11/2020   K 3.8 10/11/2020   CO2 29 10/11/2020   GLUCOSE 165 (H) 10/11/2020   BUN 15 10/11/2020   CREATININE 0.83 10/11/2020   BILITOT 0.4 10/11/2020   ALKPHOS 84 10/11/2020   AST 18 10/11/2020   ALT 14 10/11/2020   PROT 7.3 10/11/2020   ALBUMIN 4.1 10/11/2020   CALCIUM 9.7 10/11/2020   ANIONGAP 7 08/25/2016   GFR 70.35 10/11/2020   Lab Results  Component Value Date   CHOL 121 06/19/2020   Lab Results  Component Value Date   HDL 55 06/19/2020   Lab Results  Component Value Date   LDLCALC 49 06/19/2020   Lab Results  Component Value Date   TRIG 90 06/19/2020   Lab Results  Component Value Date   CHOLHDL 2.2 06/19/2020  Lab Results  Component Value Date   HGBA1C 6.3 (A) 11/12/2020      Assessment & Plan:   Problem List Items Addressed This Visit      Digestive   Clostridium difficile infection - Primary     Other   Prediabetes   Relevant Orders   POCT glycosylated hemoglobin (Hb A1C) (Completed)    Other Visit Diagnoses    Hyperglycemia       Relevant Orders   POCT glycosylated hemoglobin (Hb A1C) (Completed)     CDI No prior hx of C. diff Clinically  resolved - no diarrhea or symptoms for the last 6 days Benign abdominal exam Patient declined CBC, CMP  Reviewed labs dated 10/11/20 - hyperglycemia noted at 165 Since Type 2 DM is a risk factor for recurrent CDI, checked A1C today  Counseled to continue dietary probiotics and start OTC probiotic like [place] for at least 1 month to help gut flora recover Counseled on dietary recommendations to include soluble fiber  Prediabetes (stable) Lab Results  Component Value Date   HGBA1C 6.3 (A) 11/12/2020   Keep f/u with PCP in 2 months  No orders of the defined types were placed in this encounter.   Follow-up: No follow-ups on file.    Trixie Dredge, [place]

## 2020-11-12 NOTE — Patient Instructions (Addendum)
Continue oral probiotics (such as Culturelle) for at least one month to help your gut recover Continue to choose foods high in soluble fiber (see below) Contact us for any recurrent diarrhea/abdominal pain or new symptoms such as bloody stool or dark/black/tarry stools Keep follow-up with Herb Grays in february  Diet and C. Diff  If you have not done so already, the most important thing to introduce into your diet are *friendly bacteria*, often called probiotics, that will help repopulate your gut and crowd out the potential for regrowth of the C-diff bacteria. A review conducted in August 2018 concluded that using probiotics results in a large reduction in C-diff associated diarrhea without an increase in adverse side effects (1).  Probiotic bacteria are found in fermented foods such as yogurt, kefir, sauerkraut, tempeh (fermented) and miso (fermented soybean paste). Make sure the brand you purchase contains live cultures. This should be stated on the packaging.  In addition, an over-the-counter probiotic supplement may be of benefit to you. Your local pharmacy can order for you if they do not have it on the shelf, or your doctor may be able to prescribe prescription strength probiotics. Remember that over-the-counter supplements are not regulated in the [place], so ask your dietitian, pharmacist or doctor for a recommendation on what brand they suggest. Additionally, check your hospital policy to see if probiotic usage is allowed. Thus far, the cumulative data suggest that the most useful probiotic agents for prevention of CDI include combination L. acidophilus and Lactobacillus casei, other mixed species, S. boulardii, or L. rhamnosus [3,4]. In addition, a dosage of >10 billion colony-forming units per day may be more effective than lower doses.  With regards to your question on following a soft diet, a soft diet usually means avoiding foods high in fiber, nuts, seeds, and gassy foods and includes foods that  are easy to chew. However, there are very few research studies showing which diet is most effective for recovery of C-diff. Some animal studies showed that using diets including soluble fiber (oat bran) helped eliminate the C-diff infection sooner than a diet with insoluble fiber (wheat bran).  Foods high in soluble fiber include: oats and oat bran, oatmeal, beans, peas, carrots, barley, citrus fruits, strawberries and apple pulp.   In addition, some experts recommend banana flakes for controlling diarrhea because they add pectin and soluble fiber to the diet. This product can be ordered from your local pharmacy.  Lastly, we recommend seeking the professional expertise of a Registered Dietitian Nutritionist (RD or RDN) in your location You may have other nutritional needs that should be taken into account as you recover from this serious illness.  You can read more about the infection in your large bowel caused by the overgrowth of the bacteria called Clostridium difficile (commonly called Cdiff) at the following web sites:  Centers for Disease Control and Prevention  WebMD  Medscape

## 2020-11-15 ENCOUNTER — Other Ambulatory Visit: Payer: Self-pay | Admitting: Physician Assistant

## 2020-11-15 MED ORDER — ROSUVASTATIN CALCIUM 20 MG PO TABS
20.0000 mg | ORAL_TABLET | Freq: Every day | ORAL | 0 refills | Status: DC
Start: 2020-11-15 — End: 2021-03-03

## 2020-12-03 ENCOUNTER — Other Ambulatory Visit (HOSPITAL_COMMUNITY)
Admission: RE | Admit: 2020-12-03 | Discharge: 2020-12-03 | Disposition: A | Discharge status: Home / Self Care | Payer: PPO | Source: Ambulatory Visit | Attending: Cardiology | Admitting: Cardiology

## 2020-12-03 ENCOUNTER — Encounter: Payer: Self-pay | Admitting: Physician Assistant

## 2020-12-03 DIAGNOSIS — Z20822 Contact with and (suspected) exposure to covid-19: Secondary | ICD-10-CM | POA: Diagnosis not present

## 2020-12-03 DIAGNOSIS — Z01812 Encounter for preprocedural laboratory examination: Secondary | ICD-10-CM | POA: Diagnosis not present

## 2020-12-03 LAB — SARS CORONAVIRUS 2 (TAT 6-24 HRS): SARS Coronavirus 2: NEGATIVE

## 2020-12-04 ENCOUNTER — Telehealth: Payer: Self-pay | Admitting: Physician Assistant

## 2020-12-04 MED ORDER — SERTRALINE HCL 50 MG PO TABS: 50.0000 mg | ORAL_TABLET | Freq: Every day | ORAL | 0 refills | Status: DC

## 2020-12-04 NOTE — Telephone Encounter (Signed)
      In early November, I made the decision to stop taking my Sertraline.  I did the tapering off and have been off of it for several weeks.  Evidently, I did not realize the benefits of the Sertraline.  I have been having a lot of anxious/nervous moments.  I can't blame these on the holidays because ours were very low key.  I believe I should resume the Sertraline and am asking that a prescription be called in to [facility] on [address]. Thanks so much.   Sending script per [employer]. Patient is to take 50mg  x 4 weeks then increase to 100mg  and keep follow up in February. AS, CMA

## 2020-12-06 ENCOUNTER — Encounter (HOSPITAL_COMMUNITY): Payer: Self-pay

## 2020-12-06 ENCOUNTER — Ambulatory Visit (HOSPITAL_COMMUNITY)
Admission: RE | Admit: 2020-12-06 | Payer: PPO | Source: Ambulatory Visit | Attending: Internal Medicine | Admitting: Internal Medicine

## 2020-12-18 ENCOUNTER — Telehealth (HOSPITAL_COMMUNITY): Payer: Self-pay | Admitting: Internal Medicine

## 2020-12-18 NOTE — Telephone Encounter (Signed)
Patient called and cancelled GXT due to family emergency and will call back to reschedule, see below notes:  12/04/2020 8:47 AM   BURNEY, CHANISHA A   Cancel Rsn: Patient (pateint has a family emergency, she will call back to reschedule.)   Order will be removed fromt he WQ and when she calls back to reschedule we will reinstate the order.

## 2021-01-11 ENCOUNTER — Encounter: Payer: Self-pay | Admitting: Physician Assistant

## 2021-01-19 ENCOUNTER — Other Ambulatory Visit: Payer: Self-pay | Admitting: Internal Medicine

## 2021-01-20 ENCOUNTER — Ambulatory Visit (INDEPENDENT_AMBULATORY_CARE_PROVIDER_SITE_OTHER): Payer: PPO | Admitting: Physician Assistant

## 2021-01-20 ENCOUNTER — Other Ambulatory Visit: Payer: Self-pay

## 2021-01-20 ENCOUNTER — Encounter: Payer: Self-pay | Admitting: Physician Assistant

## 2021-01-20 VITALS — BP 111/66 | HR 51 | Temp 97.4°F | Ht 65.0 in | Wt 158.8 lb

## 2021-01-20 DIAGNOSIS — E559 Vitamin D deficiency, unspecified: Secondary | ICD-10-CM | POA: Diagnosis not present

## 2021-01-20 DIAGNOSIS — Z0000 Encounter for general adult medical examination without abnormal findings: Secondary | ICD-10-CM

## 2021-01-20 DIAGNOSIS — F39 Unspecified mood [affective] disorder: Secondary | ICD-10-CM

## 2021-01-20 DIAGNOSIS — F419 Anxiety disorder, unspecified: Secondary | ICD-10-CM | POA: Diagnosis not present

## 2021-01-20 DIAGNOSIS — R7303 Prediabetes: Secondary | ICD-10-CM

## 2021-01-20 DIAGNOSIS — Z780 Asymptomatic menopausal state: Secondary | ICD-10-CM

## 2021-01-20 MED ORDER — SERTRALINE HCL 100 MG PO TABS: 100.0000 mg | ORAL_TABLET | Freq: Every day | ORAL | 1 refills | Status: DC

## 2021-01-20 MED ORDER — VITAMIN D (ERGOCALCIFEROL) 1.25 MG (50000 UNIT) PO CAPS: 50000.0000 [IU] | ORAL_CAPSULE | ORAL | 0 refills | Status: DC

## 2021-01-20 NOTE — Patient Instructions (Signed)
Preventive Care 73 Years and Older, Female Preventive care refers to lifestyle choices and visits with your health care provider that can promote health and wellness. This includes:  A yearly physical exam. This is also called an annual wellness visit.  Regular dental and eye exams.  Immunizations.  Screening for certain conditions.  Healthy lifestyle choices, such as: ? Eating a healthy diet. ? Getting regular exercise. ? Not using drugs or products that contain nicotine and tobacco. ? Limiting alcohol use. What can I expect for my preventive care visit? Physical exam Your health care provider will check your:  Height and weight. These may be used to calculate your BMI (body mass index). BMI is a measurement that tells if you are at a healthy weight.  Heart rate and blood pressure.  Body temperature.  Skin for abnormal spots. Counseling Your health care provider may ask you questions about your:  Past medical problems.  Family's medical history.  Alcohol, tobacco, and drug use.  Emotional well-being.  Home life and relationship well-being.  Sexual activity.  Diet, exercise, and sleep habits.  History of falls.  Memory and ability to understand (cognition).  Work and work Statistician.  Pregnancy and menstrual history.  Access to firearms. What immunizations do I need? Vaccines are usually given at various ages, according to a schedule. Your health care provider will recommend vaccines for you based on your age, medical history, and lifestyle or other factors, such as travel or where you work.   What tests do I need? Blood tests  Lipid and cholesterol levels. These may be checked every 5 years, or more often depending on your overall health.  Hepatitis C test.  Hepatitis B test. Screening  Lung cancer screening. You may have this screening every year starting at age 73 if you have a 30-pack-year history of smoking and currently smoke or have quit within  the past 15 years.  Colorectal cancer screening. ? All adults should have this screening starting at age 73 and continuing until age 73. ? Your health care provider may recommend screening at age 73 if you are at increased risk. ? You will have tests every 1-10 years, depending on your results and the type of screening test.  Diabetes screening. ? This is done by checking your blood sugar (glucose) after you have not eaten for a while (fasting). ? You may have this done every 1-3 years.  Mammogram. ? This may be done every 1-2 years. ? Talk with your health care provider about how often you should have regular mammograms.  Abdominal aortic aneurysm (AAA) screening. You may need this if you are a current or former smoker.  BRCA-related cancer screening. This may be done if you have a family history of breast, ovarian, tubal, or peritoneal cancers. Other tests  STD (sexually transmitted disease) testing, if you are at risk.  Bone density scan. This is done to screen for osteoporosis. You may have this done starting at age 73. Talk with your health care provider about your test results, treatment options, and if necessary, the need for more tests. Follow these instructions at home: Eating and drinking  Eat a diet that includes fresh fruits and vegetables, whole grains, lean protein, and low-fat dairy products. Limit your intake of foods with high amounts of sugar, saturated fats, and salt.  Take vitamin and mineral supplements as recommended by your health care provider.  Do not drink alcohol if your health care provider tells you not to drink.  If you drink alcohol: ? Limit how much you have to 0-1 drink a day. ? Be aware of how much alcohol is in your drink. In the [place] one drink equals one 12 oz bottle of beer (355 mL), one 5 oz glass of wine (148 mL), or one 1 oz glass of hard liquor (44 mL).   Lifestyle  Take daily care of your teeth and gums. Brush your teeth every morning  and night with fluoride toothpaste. Floss one time each day.  Stay active. Exercise for at least 30 minutes 5 or more days each week.  Do not use any products that contain nicotine or tobacco, such as cigarettes, e-cigarettes, and chewing tobacco. If you need help quitting, ask your health care provider.  Do not use drugs.  If you are sexually active, practice safe sex. Use a condom or other form of protection in order to prevent STIs (sexually transmitted infections).  Talk with your health care provider about taking a low-dose aspirin or statin.  Find healthy ways to cope with stress, such as: ? Meditation, yoga, or listening to music. ? Journaling. ? Talking to a trusted person. ? Spending time with friends and family. Safety  Always wear your seat belt while driving or riding in a vehicle.  Do not drive: ? If you have been drinking alcohol. Do not ride with someone who has been drinking. ? When you are tired or distracted. ? While texting.  Wear a helmet and other protective equipment during sports activities.  If you have firearms in your house, make sure you follow all gun safety procedures. What's next?  Visit your health care provider once a year for an annual wellness visit.  Ask your health care provider how often you should have your eyes and teeth checked.  Stay up to date on all vaccines. This information is not intended to replace advice given to you by your health care provider. Make sure you discuss any questions you have with your health care provider. Document Revised: 73/06/2020 Document Reviewed: 73/10/2018 Elsevier Patient Education  2021 Elsevier Inc.  

## 2021-01-20 NOTE — Telephone Encounter (Signed)
Prescription refill request for Xarelto received.  Indication: atrial fibrillation Last office visit:11/21  hilty Weight:72 kg Age:75 Scr: 0.83  11/21 CrCl:72.25 ml/min  Prescription refilled

## 2021-01-20 NOTE — Progress Notes (Signed)
Subjective:   Daisy Nelson is a 75 y.o. female who presents for Medicare Annual (Subsequent) preventive examination.  Review of Systems    General:   No F/C, wt loss Pulm:   No DIB, SOB, pleuritic chest pain Card:  No CP, palpitations Abd:  No n/v/d or pain Ext:  No inc edema from baseline    Objective:    Today's Vitals   01/20/21 0835  BP: (!) 90/52  Pulse: (!) 51  Temp: (!) 97.4 F (36.3 C)  SpO2: 95%  Weight: 158 lb 12.8 oz (72 kg)  Height: 5\' 5"  (1.651 m)   Body mass index is 26.43 kg/m.  Advanced Directives 08/19/2020 05/10/2018 09/20/2017 08/25/2016 08/31/2015 09/28/2014 02/06/2013  Does Patient Have a Medical Advance Directive? No No No No No No Patient has advance directive, copy not in chart  Type of Advance Directive - - - - - - Living will  Would patient like information on creating a medical advance directive? Yes (MAU/Ambulatory/Procedural Areas - Information given) No - Patient declined No - Patient declined No - patient declined information No - patient declined information Yes - Educational materials given -    Current Medications (verified) Outpatient Encounter Medications as of 01/20/2021  Medication Sig  . calcium carbonate (OSCAL) 1500 (600 Ca) MG TABS tablet Take 600 mg of elemental calcium by mouth 2 (two) times daily with a meal.  . cimetidine (TAGAMET) 200 MG tablet Take 200 mg by mouth every evening.  . fexofenadine (ALLEGRA) 180 MG tablet Take 180 mg by mouth daily.  . flecainide (TAMBOCOR) 50 MG tablet Take 1 tablet (50 mg total) by mouth 2 (two) times daily.  . fluticasone (FLONASE) 50 MCG/ACT nasal spray Use 2 spray(s) in each nostril once daily  . metoprolol tartrate (LOPRESSOR) 25 MG tablet Take 1/2 (one-half) tablet by mouth twice daily (Patient taking differently: Take 12.5 mg by mouth 2 (two) times daily.)  . montelukast (SINGULAIR) 10 MG tablet TAKE 1 TABLET BY MOUTH AT BEDTIME  . rivaroxaban (XARELTO) 20 MG TABS tablet Take 1 tablet  (20 mg total) by mouth daily with supper.  . rosuvastatin (CRESTOR) 20 MG tablet Take 1 tablet (20 mg total) by mouth daily.  . sertraline (ZOLOFT) 50 MG tablet Take 1 tablet (50 mg total) by mouth daily. Take 50 mg x 4 weeks then increase to 100mg . (Patient taking differently: Take 100 mg by mouth daily. Take 50 mg x 4 weeks then increase to 100mg .)  . Vitamin D, Ergocalciferol, (DRISDOL) 1.25 MG (50000 UNIT) CAPS capsule Take 1 capsule by mouth once a week   No facility-administered encounter medications on file as of 01/20/2021.    Allergies (verified) Adhesive [tape], Erythromycin, Naproxen, and Penicillins   History: Past Medical History:  Diagnosis Date  . AF (atrial fibrillation) ([place])    AF with RVR 01/2013  . Allergy   . Anxiety   . Atrial fibrillation with RVR ([place]) 08/31/2015  . Colon polyps   . Complication of anesthesia 2011   colonscopy- "slow to awaken"  . Depression   . DJD (degenerative joint disease) of cervical spine   . Dysrhythmia    afib  . Family history of CABG   . GERD (gastroesophageal reflux disease)   . History of kidney stones   . Hyperglycemia   . Hyperlipidemia   . Hypertension   . Jaw pain 08/31/2015  . Pneumonia   . PONV (postoperative nausea and vomiting)   . Wears glasses  Past Surgical History:  Procedure Laterality Date  . [place] VITRECTOMY WITH 20 GAUGE MVR PORT FOR MACULAR HOLE Right 08/25/2016   Procedure: 25 GAUGE PARS PLANA VITRECTOMY; RIGHT EYE;  Surgeon: [name], MD;  Location: [facility];  Service: Ophthalmology;  Laterality: Right;  . ABDOMINAL HYSTERECTOMY  1991  . CATARACT EXTRACTION W/ INTRAOCULAR LENS  IMPLANT, BILATERAL Bilateral 03/2013  . CESAREAN SECTION  1976  . COLONOSCOPY  2011   polyps  . COLONOSCOPY WITH PROPOFOL N/A 08/19/2020   Procedure: COLONOSCOPY WITH PROPOFOL;  Surgeon: Doran Stabler, MD;  Location: [facility];  Service: Gastroenterology;  Laterality: N/A;  . EYE SURGERY    . GANGLION  CYST EXCISION Left    hand  . GAS/FLUID EXCHANGE Right 08/25/2016   Procedure: GAS/FLUID EXCHANGE RIGHT EYE;  Surgeon: [name], MD;  Location: [facility];  Service: Ophthalmology;  Laterality: Right;  . IRIDECTOMY Right 08/25/2016   Procedure: PERIPHERAL IRIDECTOMY RIGHT EYE;  Surgeon: [name], MD;  Location: [facility];  Service: Ophthalmology;  Laterality: Right;  . LAPAROSCOPY  1976  . MEMBRANE PEEL Right 08/25/2016   Procedure: MEMBRANE PEEL RIGHT EYE;  Surgeon: [name], MD;  Location: [facility];  Service: Ophthalmology;  Laterality: Right;  . NM MYOCAR PERF WALL MOTION  01/2013   lexiscan - no pharmacologically induced ischemia, fixed defects in apical segments of anterior septum & inferior lateral wall   . PARS PLANA VITRECTOMY Right 08/25/2016   laser, gas injection, AC wash out. Membrane peel right eye  . POLYPECTOMY N/A 08/19/2020   Procedure: POLYPECTOMY;  Surgeon: Doran Stabler, MD;  Location: [facility];  Service: Gastroenterology;  Laterality: N/A;  . TRANSTHORACIC ECHOCARDIOGRAM  01/2013   EF 55-60%, mod conc LVH; mild MR with mildly thickened leaflets; mild TR; PA peak pressure 48mmHg  . TUBAL LIGATION  1977   Family History  Problem Relation Age of Onset  . Stroke Mother   . Diabetes Father   . Heart disease Father        CABG  . Arthritis Father        C6-7  . Skin cancer Father        SCC  . Arthritis Brother        s/p THR   Social History   Socioeconomic History  . Marital status: Married    Spouse name: Herbie [place]  . Number of children: 1  . Years of education: 8  . Highest education level: Not on file  Occupational History  . Occupation: Medical illustrator  Tobacco Use  . Smoking status: Former Smoker    Packs/day: 0.50    Years: 36.00    Pack years: 18.00    Types: Cigarettes    Start date: 03/15/1963    Quit date: 01/08/2005    Years since quitting: 16.0  . Smokeless tobacco: Never Used  Vaping Use  . Vaping Use: Never used  Substance  and Sexual Activity  . Alcohol use: No  . Drug use: No  . Sexual activity: Yes    Partners: Male    Birth control/protection: Surgical  Other Topics Concern  . Not on file  Social History Narrative   Lives with her husband and their cat, Elvis Coil.   Social Determinants of Health   Financial Resource Strain: Not on file  Food Insecurity: Not on file  Transportation Needs: Not on file  Physical Activity: Not on file  Stress: Not on  file  Social Connections: Not on file    Tobacco Counseling Counseling given: Not Answered    Diabetic?no    Activities of Daily Living In your present state of health, do you have any difficulty performing the following activities: 01/20/2021 11/12/2020  Hearing? N N  Vision? N N  Difficulty concentrating or making decisions? N N  Walking or climbing stairs? N N  Dressing or bathing? N N  Doing errands, shopping? N N  Some recent data might be hidden    Patient Care Team: Lorrene Reid, PA-C as PCP - General Debara Pickett Nadean Corwin, MD as Consulting Physician (Cardiology) [name], MD as Consulting Physician (Ophthalmology) Carol Ada, MD as Consulting Physician (Gastroenterology) Loletha Carrow Kirke Corin, MD as Consulting Physician (Gastroenterology)  Indicate any recent Medical Services you may have received from other than [facility] providers in the past year (date may be approximate).     Assessment:   This is a routine wellness examination for Remee.  Hearing/Vision screen No exam data present  Dietary issues and exercise activities discussed:  -Follow a heart healthy diet and stay well hydrated. Follow low sodium diet.  Goals    . Reduce sodium intake     Patient is currently decreasing her sodium intake due to her recent A-fib diagnose.       Depression Screen PHQ 2/9 Scores 01/20/2021 11/12/2020 09/13/2020 06/13/2020 03/14/2020 12/28/2019 12/14/2019  PHQ - 2 Score 0 0 0 0 0 0 0  PHQ- 9 Score 1 1 1 1 1 1 1     Fall  Risk Fall Risk  01/20/2021 11/12/2020 09/13/2020 10/02/2019 06/12/2019  Falls in the past year? 0 0 0 0 0  Number falls in past yr: - - - 0 -  Injury with Fall? - - - 0 -  Risk for fall due to : - No Fall Risks - - -  Follow up Falls evaluation completed Falls evaluation completed Falls evaluation completed - Falls evaluation completed    [place]:  Any stairs in or around the home? Yes  If so, are there any without handrails? Yes  Home free of loose throw rugs in walkways, pet beds, electrical cords, etc? Yes  Adequate lighting in your home to reduce risk of falls? Yes   ASSISTIVE DEVICES UTILIZED TO PREVENT FALLS:  Life alert? No  Use of a cane, walker or w/c? No  Grab bars in the bathroom? No  Shower chair or bench in shower? No  Elevated toilet seat or a handicapped toilet? Yes   TIMED UP AND GO:  Was the test performed? Yes .  Length of time to ambulate 10 feet: 15  sec. limited due to hip pain  Gait slow and steady with assistive device  Cognitive Function: wnl     6CIT Screen 01/20/2021 10/02/2019 09/20/2017  What Year? 0 points 0 points 0 points  What month? 0 points 0 points 0 points  What time? 0 points 0 points 0 points  Count back from 20 0 points 0 points 0 points  Months in reverse 0 points 0 points 0 points  Repeat phrase 0 points 0 points 2 points  Total Score 0 0 2    Immunizations Immunization History  Administered Date(s) Administered  . Fluad Quad(high Dose 65+) 09/12/2019  . Influenza Split 08/24/2011  . Influenza, High Dose Seasonal PF 09/12/2018, 09/13/2020  . Influenza, Seasonal, Injecte, Preservative Fre 11/15/2012  . Influenza,inj,Quad PF,6+ Mos 08/03/2013, 09/28/2014, 09/24/2015, 09/22/2016,  08/30/2017  . Influenza-Unspecified 08/30/2017  . PFIZER(Purple Top)SARS-COV-2 Vaccination 01/20/2020, 02/13/2020  . Pneumococcal Conjugate-13 09/28/2014  . Pneumococcal Polysaccharide-23 08/03/2013  . Tdap 08/17/2008   . Zoster 11/27/2013  . Zoster Recombinat (Shingrix) 10/04/2019, 12/05/2019    TDAP status: Due, Education has been provided regarding the importance of this vaccine. Advised may receive this vaccine at local pharmacy or Health Dept. Aware to provide a copy of the vaccination record if obtained from local pharmacy or Health Dept. Verbalized acceptance and understanding.  Flu Vaccine status: Up to date  Pneumococcal vaccine status: Up to date  Covid-19 vaccine status: Completed vaccines  Qualifies for Shingles Vaccine? Yes   Zostavax completed Yes   Shingrix Completed?: Yes  Screening Tests Health Maintenance  Topic Date Due  . TETANUS/TDAP  08/17/2018  . COVID-19 Vaccine (3 - Booster for Pfizer series) 08/15/2020  . MAMMOGRAM  07/19/2022  . COLONOSCOPY (Pts 45-57yrs Insurance coverage will need to be confirmed)  08/19/2025  . INFLUENZA VACCINE  Completed  . DEXA SCAN  Completed  . Hepatitis C Screening  Completed  . PNA vac Low Risk Adult  Completed    Health Maintenance  Health Maintenance Due  Topic Date Due  . TETANUS/TDAP  08/17/2018  . COVID-19 Vaccine (3 - Booster for Pfizer series) 08/15/2020    Colorectal cancer screening: Type of screening: Colonoscopy. Completed 08/19/2020. Repeat every 5 years  Mammogram status: Completed 07/19/2020. Repeat every year  Bone Density status: Completed 03/15/2020. Results reflect: Bone density results: NORMAL. Repeat every 3 years.  Lung Cancer Screening: (Low Dose CT Chest recommended if Age 21-80 years, 30 pack-year currently smoking OR have quit w/in 15years.) does not qualify.   Lung Cancer Screening Referral:   Additional Screening:  Hepatitis C Screening: does qualify; Completed PATIENT DECLINED  Vision Screening: Recommended annual ophthalmology exams for early detection of glaucoma and other disorders of the eye. Is the patient up to date with their annual eye exam?  Yes  Who is the provider or what is the name of  the office in which the patient attends annual eye exams? Dr. Jabier Mutton If pt is not established with a provider, would they like to be referred to a provider to establish care? No .   Dental Screening: Recommended annual dental exams for proper oral hygiene  Community Resource Referral / Chronic Care Management: CRR required this visit?  No   CCM required this visit?  No      Plan:  -Continue current medication regimen. -Will notify of lab results once available. -Advised to let me know if would like to pursue ortho referral. -Follow up in 4 months for Mood, HLD, Vit D.  I have personally reviewed and noted the following in the patient's chart:   . Medical and social history . Use of alcohol, tobacco or illicit drugs  . Current medications and supplements . Functional ability and status . Nutritional status . Physical activity . Advanced directives . List of other physicians . Hospitalizations, surgeries, and ER visits in previous 12 months . Vitals . Screenings to include cognitive, depression, and falls . Referrals and appointments  In addition, I have reviewed and discussed with patient certain preventive protocols, quality metrics, and best practice recommendations. A written personalized care plan for preventive services as well as general preventive health recommendations were provided to patient.

## 2021-01-21 LAB — COMPREHENSIVE METABOLIC PANEL
ALT: 18 IU/L (ref 0–32)
AST: 22 IU/L (ref 0–40)
Albumin/Globulin Ratio: 1.8 (ref 1.2–2.2)
Albumin: 4.3 g/dL (ref 3.7–4.7)
Alkaline Phosphatase: 105 IU/L (ref 44–121)
BUN/Creatinine Ratio: 16 (ref 12–28)
BUN: 16 mg/dL (ref 8–27)
Bilirubin Total: 0.4 mg/dL (ref 0.0–1.2)
CO2: 23 mmol/L (ref 20–29)
Calcium: 9.7 mg/dL (ref 8.7–10.3)
Chloride: 102 mmol/L (ref 96–106)
Creatinine, Ser: 1.03 mg/dL — ABNORMAL HIGH (ref 0.57–1.00)
GFR calc Af Amer: 63 mL/min/{1.73_m2} (ref >59)
GFR calc non Af Amer: 54 mL/min/{1.73_m2} — ABNORMAL LOW (ref >59)
Globulin, Total: 2.4 g/dL (ref 1.5–4.5)
Glucose: 126 mg/dL — ABNORMAL HIGH (ref 65–99)
Potassium: 5.2 mmol/L (ref 3.5–5.2)
Sodium: 140 mmol/L (ref 134–144)
Total Protein: 6.7 g/dL (ref 6.0–8.5)

## 2021-01-21 LAB — CBC
Hematocrit: 40.7 % (ref 34.0–46.6)
Hemoglobin: 13.4 g/dL (ref 11.1–15.9)
MCH: 29.2 pg (ref 26.6–33.0)
MCHC: 32.9 g/dL (ref 31.5–35.7)
MCV: 89 fL (ref 79–97)
Platelets: 177 10*3/uL (ref 150–450)
RBC: 4.59 x10E6/uL (ref 3.77–5.28)
RDW: 14.3 % (ref 11.7–15.4)
WBC: 4.8 10*3/uL (ref 3.4–10.8)

## 2021-01-21 LAB — HEMOGLOBIN A1C
Est. average glucose Bld gHb Est-mCnc: 143 mg/dL
Hgb A1c MFr Bld: 6.6 % — ABNORMAL HIGH (ref 4.8–5.6)

## 2021-01-21 LAB — LIPID PANEL
Chol/HDL Ratio: 2.2 ratio (ref 0.0–4.4)
Cholesterol, Total: 143 mg/dL (ref 100–199)
HDL: 64 mg/dL (ref >39)
LDL Chol Calc (NIH): 60 mg/dL (ref 0–99)
Triglycerides: 108 mg/dL (ref 0–149)
VLDL Cholesterol Cal: 19 mg/dL (ref 5–40)

## 2021-01-21 LAB — TSH: TSH: 1.95 u[IU]/mL (ref 0.450–4.500)

## 2021-01-21 LAB — VITAMIN D 25 HYDROXY (VIT D DEFICIENCY, FRACTURES): Vit D, 25-Hydroxy: 87.7 ng/mL (ref 30.0–100.0)

## 2021-02-13 ENCOUNTER — Other Ambulatory Visit: Payer: Self-pay | Admitting: Physician Assistant

## 2021-02-13 DIAGNOSIS — J309 Allergic rhinitis, unspecified: Secondary | ICD-10-CM

## 2021-02-17 ENCOUNTER — Other Ambulatory Visit: Payer: Self-pay | Admitting: Physician Assistant

## 2021-02-17 ENCOUNTER — Other Ambulatory Visit: Payer: PPO

## 2021-02-17 ENCOUNTER — Other Ambulatory Visit: Payer: Self-pay

## 2021-02-17 DIAGNOSIS — Z0000 Encounter for general adult medical examination without abnormal findings: Secondary | ICD-10-CM

## 2021-02-18 LAB — HEPATIC FUNCTION PANEL
ALT: 15 IU/L (ref 0–32)
AST: 20 IU/L (ref 0–40)
Albumin: 4.4 g/dL (ref 3.7–4.7)
Alkaline Phosphatase: 95 IU/L (ref 44–121)
Bilirubin Total: 0.3 mg/dL (ref 0.0–1.2)
Bilirubin, Direct: 0.1 mg/dL (ref 0.00–0.40)
Total Protein: 6.7 g/dL (ref 6.0–8.5)

## 2021-02-19 LAB — COMPREHENSIVE METABOLIC PANEL
ALT: 15 IU/L (ref 0–32)
AST: 21 IU/L (ref 0–40)
Albumin/Globulin Ratio: 1.7 (ref 1.2–2.2)
Albumin: 4.3 g/dL (ref 3.7–4.7)
Alkaline Phosphatase: 90 IU/L (ref 44–121)
BUN/Creatinine Ratio: 19 (ref 12–28)
BUN: 19 mg/dL (ref 8–27)
Bilirubin Total: 0.2 mg/dL (ref 0.0–1.2)
CO2: 18 mmol/L — ABNORMAL LOW (ref 20–29)
Calcium: 9.5 mg/dL (ref 8.7–10.3)
Chloride: 100 mmol/L (ref 96–106)
Creatinine, Ser: 0.98 mg/dL (ref 0.57–1.00)
Globulin, Total: 2.5 g/dL (ref 1.5–4.5)
Glucose: 122 mg/dL — ABNORMAL HIGH (ref 65–99)
Potassium: 4.4 mmol/L (ref 3.5–5.2)
Sodium: 137 mmol/L (ref 134–144)
Total Protein: 6.8 g/dL (ref 6.0–8.5)
eGFR: 61 mL/min/{1.73_m2} (ref >59)

## 2021-02-19 LAB — SPECIMEN STATUS REPORT

## 2021-03-03 ENCOUNTER — Other Ambulatory Visit: Payer: Self-pay | Admitting: Physician Assistant

## 2021-04-21 MED ORDER — METOPROLOL SUCCINATE ER 25 MG PO TB24: 25.0000 mg | ORAL_TABLET | Freq: Every day | ORAL | 3 refills | Status: DC

## 2021-04-21 NOTE — Telephone Encounter (Signed)
Ok to change to Toprol XL 25 mg daily.  Dr Lemmie Evens

## 2021-05-20 ENCOUNTER — Other Ambulatory Visit: Payer: Self-pay

## 2021-05-20 ENCOUNTER — Encounter: Payer: Self-pay | Admitting: Internal Medicine

## 2021-05-20 ENCOUNTER — Ambulatory Visit: Payer: PPO | Admitting: Internal Medicine

## 2021-05-20 VITALS — BP 112/66 | HR 51 | Ht 65.0 in | Wt 158.8 lb

## 2021-05-20 DIAGNOSIS — Z7901 Long term (current) use of anticoagulants: Secondary | ICD-10-CM | POA: Diagnosis not present

## 2021-05-20 DIAGNOSIS — Z79899 Other long term (current) drug therapy: Secondary | ICD-10-CM

## 2021-05-20 DIAGNOSIS — I480 Paroxysmal atrial fibrillation: Secondary | ICD-10-CM | POA: Diagnosis not present

## 2021-05-20 DIAGNOSIS — Z5181 Encounter for therapeutic drug level monitoring: Secondary | ICD-10-CM | POA: Diagnosis not present

## 2021-05-20 DIAGNOSIS — I700 Atherosclerosis of aorta: Secondary | ICD-10-CM | POA: Diagnosis not present

## 2021-05-20 NOTE — Patient Instructions (Signed)
Medication Instructions:  No Changes In Medications at this time.  *If you need a refill on your cardiac medications before your next appointment, please call your pharmacy*  Testing/Procedures: Your physician has requested that you have a [place]. For further information please visit [email or web address]. Please follow instruction sheet, as given.  The test will take approximately 3 to 4 hours to complete; you may bring reading material.  If someone comes with you to your appointment, they will need to remain in the main lobby due to limited space in the testing area.    How to prepare for your Myocardial Perfusion Test: Do not eat or drink 3 hours prior to your test, except you may have water. Do not consume products containing caffeine (regular or decaffeinated) 12 hours prior to your test. (ex: coffee, chocolate, sodas, tea). Do wear comfortable clothes (no dresses or overalls) and walking shoes, tennis shoes preferred (No heels or open toe shoes are allowed). Do NOT wear cologne, perfume, aftershave, or lotions (deodorant is allowed). If you use an inhaler, use it the AM of your test and bring it with you.  If you use a nebulizer, use it the AM of your test.  If these instructions are not followed, your test will have to be rescheduled.  Follow-Up: At [facility], you and your health needs are our priority.  As part of our continuing mission to provide you with exceptional heart care, we have created designated Provider Care Teams.  These Care Teams include your primary Cardiologist (physician) and Advanced Practice Providers (APPs -  Physician Assistants and Nurse Practitioners) who all work together to provide you with the care you need, when you need it.  Your next appointment:   1 year(s)  The format for your next appointment:   In Person  Provider:   K. Mali Hilty, MD

## 2021-05-20 NOTE — Progress Notes (Signed)
OFFICE NOTE  Chief Complaint:  No complaints  Primary Care Physician: Daisy Reid, PA-C  ID:  Daisy Nelson, DOB [birth year], MRN [id number]  PCP:  Daisy Reid, PA-C  Primary Cardiologist:  Daisy Nelson     History of Present Illness: Daisy Nelson is a 75 y.o. female no prior history of CAD or AF, presented to the ER 3/10 around 3am with complaints of jaw pain, palpitations, and SOB. Her symptoms woke her from sleep around 12:30am. In the ER she was noted to be in Rapid AF. Her EKG shows diffuse ST depression. Initial Troponin is negative. Her symptoms improved after Diltiazem IV was started. She denied any prior history of similar symptoms in the past. She did take Mucinex D earlier this week for sinus infection.   The patient was admitted to [facility] and started on diltiazem, heparin, ASA, beta blocker and nitrates. She spontaneously converted to NSR and ruled out for MI. Lexiscan myovew revealed normal LVF, no ischemia and fixed defects involving the apical segments of the inferior septum and inferior lateral walls. 2D echo confirmed normal EF of 55-60%. Mild TR. CHADSVSC= 1. She was started on Xarelto.  Lopressor was continued.   She was last seen by Daisy Fuller, PA-C, as she was about to have cataract surgery.  It went very well for her and she can now see clearly.  She reports only an accessional fluttering in her chest.  I did decrease her lopressor to 12.5 mg bid during the last visit because she was having a HR in the 40's.  She otherwise denies  nausea, vomiting, fever, chest pain, shortness of breath, orthopnea, dizziness, PND, cough, congestion, abdominal pain, hematochezia, melena, lower extremity edema.  Daisy Nelson back in the office today. She is currently without complaints. She is active denies any chest pain or shortness of breath. She very rarely gets palpitations the last only for a few seconds. She denies any recurrent atrial fibrillation and her EKG today sinus  rhythm. She's had no bleeding problems on aspirin. She had laboratory work in October which shows a well controlled lipid profile on pravastatin. Weight is stable  I saw Daisy Nelson back today in the office. Overall she is doing very well. She denies any chest pain or shortness of breath. She's had no recurrent A. Fib episodes. She has not needed to take her flecainide as needed. She continues on Xarelto without any bleeding problems. Blood pressure is well-controlled today.  01/18/2017  Daisy Nelson returns today for follow-up. She reports no significant recurrent atrial fibrillation. EKG today shows sinus bradycardia with nonspecific ST changes. She is tolerating Xarelto without any bleeding problems. She has not needed the flecainide pocket pill strategy, but is due for a refill. QTC is 395 ms.  01/11/2018  Daisy Nelson was seen today in follow-up.  Overall she reports doing well.  She denies any significant recurrent atrial fibrillation.  She occasionally feels palpitations.  She does not do active heart rate monitoring.  We discussed the [employer] application as well as the newest iteration of the apple watch, both of which can monitor heart rhythms.  He is tolerating Xarelto without bleeding problems.  She has not required flecainide.  She may need a new prescription as a drug could be older and she should check back with [place] when she looks at her bottle.  06/29/2019  [name] is seen today in follow-up.  Overall she continues to do well.  She says last summer she had one  episode of what she thought was A. fib and took 2 flecainide at night and went to sleep.  She says she woke up and she felt fine without any recurrence.  She continues to monitor heart rate and has not noted any recurrent A. fib.  She denies any bleeding complications on Xarelto.  Overall energy level is good.  She is followed by her PCP closely and recently did a virtual visit.  Blood pressures well controlled  today.  07/18/2020  [name] is seen today in follow-up.  Overall she is doing well.  She says that recently though she has had more episodes of atrial fibrillation.  She had an episode in December which responded to pocket pill flecainide.  Since then she has had 3 other episodes and they generally last less than 24 hours and seem to respond to flecainide but she feels drained for at least the next 24 to 48 hours.  Her husband was diagnosed with multivessel coronary disease this spring as well and underwent bypass surgery and he is doing better.  She does have a screening colonoscopy scheduled for September.  10/22/2020  [name] returns today for follow-up.  Reports that she recently developed diverticulitis episode shortly after having her colonoscopy.  She is currently on Cipro and Flagyl, however her gastroenterologist may not have known she was taking flecainide.  This can interact with Cipro causing QT prolongation.  Fortunately her EKG is stable today showing sinus bradycardia with a QTC of 434 ms.  She denies any chest pain.  She is not actually had repeat stress testing since a Myoview stress test while hospitalized in 2014.  Recent imaging of the abdomen showed aortic atherosclerosis.  That being said her blood pressure is well controlled.  She is on high potency statin therapy with well-controlled dyslipidemia and a total cholesterol 121, HDL 55, triglycerides 90 and LDL of 49.  6/212022  [name] is seen today in follow-up.  She reports good control over her A. fib on flecainide.  QTC today was 407 ms.  She is in sinus bradycardia.  She reports tolerating Xarelto without any issues.  She not undergo treadmill stress testing.  She was having some issues with her husband's health and she was caring for him and then developed problems with her hip and does not feel that she can walk on a treadmill.   Wt Readings from Last 3 Encounters:  05/20/21 158 lb 12.8 oz (72 kg)  01/20/21  158 lb 12.8 oz (72 kg)  11/12/20 159 lb 8 oz (72.3 kg)     Past Medical History:  Diagnosis Date   AF (atrial fibrillation) ([place])    AF with RVR 01/2013   Allergy    Anxiety    Atrial fibrillation with RVR ([place]) 08/31/2015   Colon polyps    Complication of anesthesia 2011   colonscopy- "slow to awaken"   Depression    DJD (degenerative joint disease) of cervical spine    Dysrhythmia    afib   Family history of CABG    GERD (gastroesophageal reflux disease)    History of kidney stones    Hyperglycemia    Hyperlipidemia    Hypertension    Jaw pain 08/31/2015   Pneumonia    PONV (postoperative nausea and vomiting)    Wears glasses     Current Outpatient Medications  Medication Sig Dispense Refill   calcium carbonate (OSCAL) 1500 (600 Ca) MG TABS tablet Take 600 mg of elemental calcium  by mouth 2 (two) times daily with a meal.     cimetidine (TAGAMET) 200 MG tablet Take 200 mg by mouth every evening.     fexofenadine (ALLEGRA) 180 MG tablet Take 180 mg by mouth daily.     flecainide (TAMBOCOR) 50 MG tablet Take 1 tablet (50 mg total) by mouth 2 (two) times daily. 180 tablet 3   fluticasone (FLONASE) 50 MCG/ACT nasal spray Use 2 spray(s) in each nostril once daily 48 g 0   metoprolol succinate (TOPROL-XL) 25 MG 24 hr tablet Take 1 tablet (25 mg total) by mouth daily. 90 tablet 3   rosuvastatin (CRESTOR) 20 MG tablet Take 1 tablet by mouth once daily 90 tablet 0   sertraline (ZOLOFT) 100 MG tablet Take 1 tablet (100 mg total) by mouth daily. 90 tablet 1   XARELTO 20 MG TABS tablet TAKE 1 TABLET BY MOUTH ONCE DAILY WITH SUPPER 90 tablet 1   montelukast (SINGULAIR) 10 MG tablet TAKE 1 TABLET BY MOUTH AT BEDTIME (Patient not taking: Reported on 05/20/2021) 90 tablet 0   No current facility-administered medications for this visit.    Allergies:    Allergies  Allergen Reactions   Adhesive [Tape]     Burn skin   Erythromycin     Gi intolerance - nausea, vomiting, and abd pains    Naproxen Nausea And Vomiting    Abdominal pain    Penicillins Hives    Has patient had a PCN reaction causing immediate rash, facial/tongue/throat swelling, SOB or lightheadedness with hypotension: UNKNOWN Has patient had a PCN reaction causing severe rash involving mucus membranes or skin necrosis: No Has patient had a PCN reaction that required hospitalization No Has patient had a PCN reaction occurring within the last 10 years: No If all of the above answers are "NO", then may proceed with Cephalosporin use.     Social History:  The patient  reports that she quit smoking about 16 years ago. Her smoking use included cigarettes. She started smoking about 58 years ago. She has a 18.00 pack-year smoking history. She has never used smokeless tobacco. She reports that she does not drink alcohol and does not use drugs.   Family history:   Family History  Problem Relation Age of Onset   Stroke Mother    Diabetes Father    Heart disease Father        CABG   Arthritis Father        C6-7   Skin cancer Father        SCC   Arthritis Brother        s/p THR    ROS: A comprehensive review of systems was negative.  HOME MEDS: Current Outpatient Medications  Medication Sig Dispense Refill   calcium carbonate (OSCAL) 1500 (600 Ca) MG TABS tablet Take 600 mg of elemental calcium by mouth 2 (two) times daily with a meal.     cimetidine (TAGAMET) 200 MG tablet Take 200 mg by mouth every evening.     fexofenadine (ALLEGRA) 180 MG tablet Take 180 mg by mouth daily.     flecainide (TAMBOCOR) 50 MG tablet Take 1 tablet (50 mg total) by mouth 2 (two) times daily. 180 tablet 3   fluticasone (FLONASE) 50 MCG/ACT nasal spray Use 2 spray(s) in each nostril once daily 48 g 0   metoprolol succinate (TOPROL-XL) 25 MG 24 hr tablet Take 1 tablet (25 mg total) by mouth daily. 90 tablet 3   rosuvastatin (CRESTOR) 20 MG  tablet Take 1 tablet by mouth once daily 90 tablet 0   sertraline (ZOLOFT) 100 MG tablet  Take 1 tablet (100 mg total) by mouth daily. 90 tablet 1   XARELTO 20 MG TABS tablet TAKE 1 TABLET BY MOUTH ONCE DAILY WITH SUPPER 90 tablet 1   montelukast (SINGULAIR) 10 MG tablet TAKE 1 TABLET BY MOUTH AT BEDTIME (Patient not taking: Reported on 05/20/2021) 90 tablet 0   No current facility-administered medications for this visit.    LABS/IMAGING: No results found for this or any previous visit (from the past 48 hour(s)). No results found.  VITALS: BP 112/66 (BP Location: Left Arm, Patient Position: Sitting, Cuff Size: Normal)   Pulse (!) 51   Ht 5\' 5"  (1.651 m)   Wt 158 lb 12.8 oz (72 kg)   SpO2 97%   BMI 26.43 kg/m   EXAM: General appearance: alert and no distress Neck: no carotid bruit and no JVD Lungs: clear to auscultation bilaterally Heart: regular rate and rhythm, S1, S2 normal, no murmur, click, rub or gallop Abdomen: soft, non-tender; bowel sounds normal; no masses,  no organomegaly Extremities: extremities normal, atraumatic, no cyanosis or edema Pulses: 2+ and symmetric Skin: Skin color, texture, turgor normal. No rashes or lesions Neurologic: Grossly normal Psych: Mood, affect normal  EKG: Sinus bradycardia 51, nonspecific ST changes-personally reviewed  ASSESSMENT: Paroxysmal atrial fibrillation - CHADSVASC score of 1 (female >65) on Xarelto Long-term flecainide therapy Dyslipidemia- at goal Aortic atherosclerosis  PLAN: 1.   Daisy Nelson continues to do well on low-dose flecainide.  She has not had any ischemic evaluation in some time.  Unfortunately she cannot exercise on a treadmill.  We will recommend Lexiscan Myoview stress testing.  No changes to her medicines today.  Follow-up with me annually or sooner as necessary.  Pixie Casino, MD, [facility], [place] Director of the [facility]  [facility] Diplomate of the American Board of Clinical Lipidology Attending  Cardiologist  Direct Dial: [phone]  Fax: [phone]  Website:  [email or web address] 05/20/2021, 2:04 PM

## 2021-05-26 ENCOUNTER — Other Ambulatory Visit: Payer: Self-pay | Admitting: Physician Assistant

## 2021-05-26 ENCOUNTER — Ambulatory Visit (INDEPENDENT_AMBULATORY_CARE_PROVIDER_SITE_OTHER): Payer: PPO | Admitting: Physician Assistant

## 2021-05-26 ENCOUNTER — Other Ambulatory Visit: Payer: Self-pay

## 2021-05-26 ENCOUNTER — Encounter: Payer: Self-pay | Admitting: Physician Assistant

## 2021-05-26 VITALS — BP 109/68 | HR 49 | Temp 96.7°F | Ht 65.0 in | Wt 159.1 lb

## 2021-05-26 DIAGNOSIS — E785 Hyperlipidemia, unspecified: Secondary | ICD-10-CM

## 2021-05-26 DIAGNOSIS — J309 Allergic rhinitis, unspecified: Secondary | ICD-10-CM

## 2021-05-26 DIAGNOSIS — R7303 Prediabetes: Secondary | ICD-10-CM

## 2021-05-26 DIAGNOSIS — F419 Anxiety disorder, unspecified: Secondary | ICD-10-CM

## 2021-05-26 DIAGNOSIS — F39 Unspecified mood [affective] disorder: Secondary | ICD-10-CM

## 2021-05-26 DIAGNOSIS — G4700 Insomnia, unspecified: Secondary | ICD-10-CM

## 2021-05-26 DIAGNOSIS — E559 Vitamin D deficiency, unspecified: Secondary | ICD-10-CM | POA: Diagnosis not present

## 2021-05-26 NOTE — Patient Instructions (Signed)
Insomnia Insomnia is a sleep disorder that makes it difficult to fall asleep or stay asleep. Insomnia can cause fatigue, low energy, difficulty concentrating, moodswings, and poor performance at work or school. There are three different ways to classify insomnia: Difficulty falling asleep. Difficulty staying asleep. Waking up too early in the morning. Any type of insomnia can be long-term (chronic) or short-term (acute). Both are common. Short-term insomnia usually lasts for three months or less. Chronic insomnia occurs at least three times a week for longer than threemonths. What are the causes? Insomnia may be caused by another condition, situation, or substance, such as: Anxiety. Certain medicines. Gastroesophageal reflux disease (GERD) or other gastrointestinal conditions. Asthma or other breathing conditions. Restless legs syndrome, sleep apnea, or other sleep disorders. Chronic pain. Menopause. Stroke. Abuse of alcohol, tobacco, or illegal drugs. Mental health conditions, such as depression. Caffeine. Neurological disorders, such as Alzheimer's disease. An overactive thyroid (hyperthyroidism). Sometimes, the cause of insomnia may not be known. What increases the risk? Risk factors for insomnia include: Gender. Women are affected more often than men. Age. Insomnia is more common as you get older. Stress. Lack of exercise. Irregular work schedule or working night shifts. Traveling between different time zones. Certain medical and mental health conditions. What are the signs or symptoms? If you have insomnia, the main symptom is having trouble falling asleep or having trouble staying asleep. This may lead to other symptoms, such as: Feeling fatigued or having low energy. Feeling nervous about going to sleep. Not feeling rested in the morning. Having trouble concentrating. Feeling irritable, anxious, or depressed. How is this diagnosed? This condition may be diagnosed based  on: Your symptoms and medical history. Your health care provider may ask about: Your sleep habits. Any medical conditions you have. Your mental health. A physical exam. How is this treated? Treatment for insomnia depends on the cause. Treatment may focus on treating an underlying condition that is causing insomnia. Treatment may also include: Medicines to help you sleep. Counseling or therapy. Lifestyle adjustments to help you sleep better. Follow these instructions at home: Eating and drinking  Limit or avoid alcohol, caffeinated beverages, and cigarettes, especially close to bedtime. These can disrupt your sleep. Do not eat a large meal or eat spicy foods right before bedtime. This can lead to digestive discomfort that can make it hard for you to sleep.  Sleep habits  Keep a sleep diary to help you and your health care provider figure out what could be causing your insomnia. Write down: When you sleep. When you wake up during the night. How well you sleep. How rested you feel the next day. Any side effects of medicines you are taking. What you eat and drink. Make your bedroom a dark, comfortable place where it is easy to fall asleep. Put up shades or blackout curtains to block light from outside. Use a white noise machine to block noise. Keep the temperature cool. Limit screen use before bedtime. This includes: Watching TV. Using your smartphone, tablet, or computer. Stick to a routine that includes going to bed and waking up at the same times every day and night. This can help you fall asleep faster. Consider making a quiet activity, such as reading, part of your nighttime routine. Try to avoid taking naps during the day so that you sleep better at night. Get out of bed if you are still awake after 15 minutes of trying to sleep. Keep the lights down, but try reading or doing a quiet   activity. When you feel sleepy, go back to bed.  General instructions Take over-the-counter  and prescription medicines only as told by your health care provider. Exercise regularly, as told by your health care provider. Avoid exercise starting several hours before bedtime. Use relaxation techniques to manage stress. Ask your health care provider to suggest some techniques that may work well for you. These may include: Breathing exercises. Routines to release muscle tension. Visualizing peaceful scenes. Make sure that you drive carefully. Avoid driving if you feel very sleepy. Keep all follow-up visits as told by your health care provider. This is important. Contact a health care provider if: You are tired throughout the day. You have trouble in your daily routine due to sleepiness. You continue to have sleep problems, or your sleep problems get worse. Get help right away if: You have serious thoughts about hurting yourself or someone else. If you ever feel like you may hurt yourself or others, or have thoughts about taking your own life, get help right away. You can go to your nearest emergency department or call: Your local emergency services (911 in the [place] A suicide crisis helpline, such as the National Suicide Prevention Lifeline at 1-800-273-8255. This is open 24 hours a day. Summary Insomnia is a sleep disorder that makes it difficult to fall asleep or stay asleep. Insomnia can be long-term (chronic) or short-term (acute). Treatment for insomnia depends on the cause. Treatment may focus on treating an underlying condition that is causing insomnia. Keep a sleep diary to help you and your health care provider figure out what could be causing your insomnia. This information is not intended to replace advice given to you by your health care provider. Make sure you discuss any questions you have with your healthcare provider. Document Revised: 09/26/2020 Document Reviewed: 09/26/2020 Elsevier Patient Education  2022 [employer]  

## 2021-05-26 NOTE — Assessment & Plan Note (Signed)
-  Will repeat lipid panel and hepatic function today. -Continue current medication regimen.  -Recommend to follow a heart healthy diet and continue to stay as active as possible.  -Will continue to monitor.

## 2021-05-26 NOTE — Assessment & Plan Note (Signed)
>>  ASSESSMENT AND PLAN FOR PREDIABETES WRITTEN ON 05/26/2021  9:04 AM BY ABONZA, MARITZA, PA-C  -Asymptomatic. Will repeat A1c. -Recommend to follow a low carbohydrate/glucose diet.

## 2021-05-26 NOTE — Assessment & Plan Note (Signed)
-  Will repeat Vitamin D today, pending results will make treatment adjustments if indicated. Continue Vitamin D3 2000 units daily.

## 2021-05-26 NOTE — Progress Notes (Signed)
Established Patient Office Visit  Subjective:  Patient ID: Daisy Nelson, female    DOB: [birth year]  Age: 75 y.o. MRN: [id number]  CC:  Chief Complaint  Patient presents with   Follow-up   Hyperlipidemia    HPI Daisy Nelson presents for follow up on hyperlipidemia and mood management. States 2 weeks ago started taking melatonin  as needed for sleep. Reports some nights has trouble falling asleep. Denies increased anxiety or caffeine intake.   HLD: Pt taking medication as directed without issues. Denies side effects including myalgias and RUQ pain. Patient reports diet consists of fruits, vegetables and various meats (chicken, red meat, pork). Stays active with doing house work and errands.   Mood: Denies mood changes. Taking medication as directed without issues. Denies SI/HI.  Vitamin D deficiency: Reports is taking Vitamin D3 2000 units daily.  Past Medical History:  Diagnosis Date   AF (atrial fibrillation) (HCC)    AF with RVR 01/2013   Allergy    Anxiety    Atrial fibrillation with RVR ([place]) 08/31/2015   Colon polyps    Complication of anesthesia 2011   colonscopy- "slow to awaken"   Depression    DJD (degenerative joint disease) of cervical spine    Dysrhythmia    afib   Family history of CABG    GERD (gastroesophageal reflux disease)    History of kidney stones    Hyperglycemia    Hyperlipidemia    Hypertension    Jaw pain 08/31/2015   Pneumonia    PONV (postoperative nausea and vomiting)    Wears glasses     Past Surgical History:  Procedure Laterality Date   25 GAUGE PARS PLANA VITRECTOMY WITH 20 GAUGE MVR PORT FOR MACULAR HOLE Right 08/25/2016   Procedure: 25 GAUGE PARS PLANA VITRECTOMY; RIGHT EYE;  Surgeon: [name], MD;  Location: [place];  Service: Ophthalmology;  Laterality: Right;   ABDOMINAL HYSTERECTOMY  1991   CATARACT EXTRACTION W/ INTRAOCULAR LENS  IMPLANT, BILATERAL Bilateral 03/2013   CESAREAN SECTION  1976   COLONOSCOPY  2011    polyps   COLONOSCOPY WITH PROPOFOL N/A 08/19/2020   Procedure: COLONOSCOPY WITH PROPOFOL;  Surgeon: Doran Stabler, MD;  Location: [facility];  Service: Gastroenterology;  Laterality: N/A;   EYE SURGERY     GANGLION CYST EXCISION Left    hand   GAS/FLUID EXCHANGE Right 08/25/2016   Procedure: GAS/FLUID EXCHANGE RIGHT EYE;  Surgeon: [name], MD;  Location: [place];  Service: Ophthalmology;  Laterality: Right;   IRIDECTOMY Right 08/25/2016   Procedure: PERIPHERAL IRIDECTOMY RIGHT EYE;  Surgeon: [name], MD;  Location: [place];  Service: Ophthalmology;  Laterality: Right;   LAPAROSCOPY  1976   MEMBRANE PEEL Right 08/25/2016   Procedure: MEMBRANE PEEL RIGHT EYE;  Surgeon: [name], MD;  Location: [place];  Service: Ophthalmology;  Laterality: Right;   NM MYOCAR PERF WALL MOTION  01/2013   lexiscan - no pharmacologically induced ischemia, fixed defects in apical segments of anterior septum & inferior lateral wall    PARS PLANA VITRECTOMY Right 08/25/2016   laser, gas injection, AC wash out. Membrane peel right eye   POLYPECTOMY N/A 08/19/2020   Procedure: POLYPECTOMY;  Surgeon: Doran Stabler, MD;  Location: [facility];  Service: Gastroenterology;  Laterality: N/A;   TRANSTHORACIC ECHOCARDIOGRAM  01/2013   EF 55-60%, mod conc LVH; mild MR with mildly thickened leaflets; mild TR; PA peak pressure 58mHg  TUBAL LIGATION  1977    Family History  Problem Relation Age of Onset   Stroke Mother    Diabetes Father    Heart disease Father        CABG   Arthritis Father        C6-7   Skin cancer Father        SCC   Arthritis Brother        s/p THR    Social History   Socioeconomic History   Marital status: Married    Spouse name: Daisy Nelson   Number of children: 1   Years of education: 12   Highest education level: Not on file  Occupational History   Occupation: insurance agent  Tobacco Use   Smoking status: Former    Packs/day: 0.50    Years: 36.00    Pack  years: 18.00    Types: Cigarettes    Start date: 03/15/1963    Quit date: 01/08/2005    Years since quitting: 16.3   Smokeless tobacco: Never  Vaping Use   Vaping Use: Never used  Substance and Sexual Activity   Alcohol use: No   Drug use: No   Sexual activity: Yes    Partners: Male    Birth control/protection: Surgical  Other Topics Concern   Not on file  Social History Narrative   Lives with her husband and their cat, Ellie Mae.   Social Determinants of Health   Financial Resource Strain: Not on file  Food Insecurity: Not on file  Transportation Needs: Not on file  Physical Activity: Not on file  Stress: Not on file  Social Connections: Not on file  Intimate Partner Violence: Not on file    Outpatient Medications Prior to Visit  Medication Sig Dispense Refill   calcium carbonate (OSCAL) 1500 (600 Ca) MG TABS tablet Take 600 mg of elemental calcium by mouth 2 (two) times daily with a meal.     cimetidine (TAGAMET) 200 MG tablet Take 200 mg by mouth every evening.     fexofenadine (ALLEGRA) 180 MG tablet Take 180 mg by mouth daily.     flecainide (TAMBOCOR) 50 MG tablet Take 1 tablet (50 mg total) by mouth 2 (two) times daily. 180 tablet 3   fluticasone (FLONASE) 50 MCG/ACT nasal spray Use 2 spray(s) in each nostril once daily 48 g 0   metoprolol succinate (TOPROL-XL) 25 MG 24 hr tablet Take 1 tablet (25 mg total) by mouth daily. 90 tablet 3   montelukast (SINGULAIR) 10 MG tablet TAKE 1 TABLET BY MOUTH AT BEDTIME 90 tablet 0   rosuvastatin (CRESTOR) 20 MG tablet Take 1 tablet by mouth once daily 90 tablet 0   sertraline (ZOLOFT) 100 MG tablet Take 1 tablet (100 mg total) by mouth daily. 90 tablet 1   XARELTO 20 MG TABS tablet TAKE 1 TABLET BY MOUTH ONCE DAILY WITH SUPPER 90 tablet 1   No facility-administered medications prior to visit.    Allergies  Allergen Reactions   Adhesive [Tape]     Burn skin   Erythromycin     Gi intolerance - nausea, vomiting, and abd pains    Naproxen Nausea And Vomiting    Abdominal pain    Penicillins Hives    Has patient had a PCN reaction causing immediate rash, facial/tongue/throat swelling, SOB or lightheadedness with hypotension: UNKNOWN Has patient had a PCN reaction causing severe rash involving mucus membranes or skin necrosis: No Has patient had a PCN reaction that required   hospitalization No Has patient had a PCN reaction occurring within the last 10 years: No If all of the above answers are "NO", then may proceed with Cephalosporin use.     ROS Review of Systems A fourteen system review of systems was performed and found to be positive as per HPI.   Objective:    Physical Exam General:  Well Developed, well nourished, in no acute distress  Neuro:  Alert and oriented,  extra-ocular muscles intact  HEENT:  Normocephalic, atraumatic, neck supple, no JVD appreciated  Skin:  no gross rash, warm, pink. Cardiac:  RRR, S1 S2 Respiratory:  ECTA B/L, Not using accessory muscles, speaking in full sentences- unlabored. Vascular:  Ext warm, no cyanosis apprec.; cap RF less 2 sec. No edema  Psych:  No HI/SI, judgement and insight good, Euthymic mood. Full Affect.  BP 109/68   Pulse (!) 49   Temp (!) 96.7 F (35.9 C)   Ht 5' 5" (1.651 m)   Wt 159 lb 1.6 oz (72.2 kg)   SpO2 95%   BMI 26.48 kg/m  Wt Readings from Last 3 Encounters:  05/26/21 159 lb 1.6 oz (72.2 kg)  05/20/21 158 lb 12.8 oz (72 kg)  01/20/21 158 lb 12.8 oz (72 kg)     Health Maintenance Due  Topic Date Due   TETANUS/TDAP  08/17/2018   COVID-19 Vaccine (3 - Pfizer risk series) 03/12/2020    There are no preventive care reminders to display for this patient.  Lab Results  Component Value Date   TSH 1.950 01/20/2021   Lab Results  Component Value Date   WBC 4.8 01/20/2021   HGB 13.4 01/20/2021   HCT 40.7 01/20/2021   MCV 89 01/20/2021   PLT 177 01/20/2021   Lab Results  Component Value Date   NA 137 02/17/2021   K 4.4  02/17/2021   CO2 18 (L) 02/17/2021   GLUCOSE 122 (H) 02/17/2021   BUN 19 02/17/2021   CREATININE 0.98 02/17/2021   BILITOT 0.3 02/17/2021   BILITOT 0.2 02/17/2021   ALKPHOS 95 02/17/2021   ALKPHOS 90 02/17/2021   AST 20 02/17/2021   AST 21 02/17/2021   ALT 15 02/17/2021   ALT 15 02/17/2021   PROT 6.7 02/17/2021   PROT 6.8 02/17/2021   ALBUMIN 4.4 02/17/2021   ALBUMIN 4.3 02/17/2021   CALCIUM 9.5 02/17/2021   ANIONGAP 7 08/25/2016   EGFR 61 02/17/2021   GFR 70.35 10/11/2020   Lab Results  Component Value Date   CHOL 143 01/20/2021   Lab Results  Component Value Date   HDL 64 01/20/2021   Lab Results  Component Value Date   LDLCALC 60 01/20/2021   Lab Results  Component Value Date   TRIG 108 01/20/2021   Lab Results  Component Value Date   CHOLHDL 2.2 01/20/2021   Lab Results  Component Value Date   HGBA1C 6.6 (H) 01/20/2021      Assessment & Plan:   Problem List Items Addressed This Visit       Other   Hyperlipidemia (Chronic)    -Will repeat lipid panel and hepatic function today. -Continue current medication regimen.  -Recommend to follow a heart healthy diet and continue to stay as active as possible.  -Will continue to monitor.        Relevant Orders   Comp Met (CMET)   CBC w/Diff   Lipid Profile   Anxiety - Primary (Chronic)   Prediabetes    -Asymptomatic. Will   repeat A1c. -Recommend to follow a low carbohydrate/glucose diet.       Relevant Orders   Comp Met (CMET)   CBC w/Diff   HgB A1c   Mood disorder (HCC)   Vitamin D deficiency    -Will repeat Vitamin D today, pending results will make treatment adjustments if indicated. Continue Vitamin D3 2000 units daily.       Relevant Orders   Vitamin D (25 hydroxy)   Other Visit Diagnoses     Insomnia, unspecified type          Mood disorder, Anxiety: -PHQ-9 score of 1, GAD-7 score of 1, stable. -Continue current medication regimen. -Will continue to  monitor.  Insomnia: -Recommend to continue melatonin as needed and avoid/limit caffeine intake.   No orders of the defined types were placed in this encounter.   Follow-up: Return in about 6 months (around 11/25/2021) for HLD, PreDM, Mood.   Note:  This note was prepared with assistance of Dragon voice recognition software. Occasional wrong-word or sound-a-like substitutions may have occurred due to the inherent limitations of voice recognition software.   Lorrene Reid, PA-C

## 2021-05-26 NOTE — Assessment & Plan Note (Signed)
-  Asymptomatic. Will repeat A1c. -Recommend to follow a low carbohydrate/glucose diet.

## 2021-05-27 LAB — CBC WITH DIFFERENTIAL/PLATELET
Basophils Absolute: 0 10*3/uL (ref 0.0–0.2)
Basos: 1 %
EOS (ABSOLUTE): 0.1 10*3/uL (ref 0.0–0.4)
Eos: 3 %
Hematocrit: 38.5 % (ref 34.0–46.6)
Hemoglobin: 12.6 g/dL (ref 11.1–15.9)
Immature Grans (Abs): 0 10*3/uL (ref 0.0–0.1)
Immature Granulocytes: 1 %
Lymphocytes Absolute: 1 10*3/uL (ref 0.7–3.1)
Lymphs: 21 %
MCH: 27.9 pg (ref 26.6–33.0)
MCHC: 32.7 g/dL (ref 31.5–35.7)
MCV: 85 fL (ref 79–97)
Monocytes Absolute: 0.7 10*3/uL (ref 0.1–0.9)
Monocytes: 14 %
Neutrophils Absolute: 2.9 10*3/uL (ref 1.4–7.0)
Neutrophils: 60 %
Platelets: 174 10*3/uL (ref 150–450)
RBC: 4.51 x10E6/uL (ref 3.77–5.28)
RDW: 13.6 % (ref 11.7–15.4)
WBC: 4.8 10*3/uL (ref 3.4–10.8)

## 2021-05-27 LAB — COMPREHENSIVE METABOLIC PANEL
ALT: 14 IU/L (ref 0–32)
AST: 17 IU/L (ref 0–40)
Albumin/Globulin Ratio: 1.8 (ref 1.2–2.2)
Albumin: 4.2 g/dL (ref 3.7–4.7)
Alkaline Phosphatase: 104 IU/L (ref 44–121)
BUN/Creatinine Ratio: 17 (ref 12–28)
BUN: 16 mg/dL (ref 8–27)
Bilirubin Total: 0.3 mg/dL (ref 0.0–1.2)
CO2: 24 mmol/L (ref 20–29)
Calcium: 9.5 mg/dL (ref 8.7–10.3)
Chloride: 104 mmol/L (ref 96–106)
Creatinine, Ser: 0.96 mg/dL (ref 0.57–1.00)
Globulin, Total: 2.4 g/dL (ref 1.5–4.5)
Glucose: 134 mg/dL — ABNORMAL HIGH (ref 65–99)
Potassium: 4.6 mmol/L (ref 3.5–5.2)
Sodium: 139 mmol/L (ref 134–144)
Total Protein: 6.6 g/dL (ref 6.0–8.5)
eGFR: 62 mL/min/{1.73_m2} (ref >59)

## 2021-05-27 LAB — VITAMIN D 25 HYDROXY (VIT D DEFICIENCY, FRACTURES): Vit D, 25-Hydroxy: 69.6 ng/mL (ref 30.0–100.0)

## 2021-05-27 LAB — LIPID PANEL
Chol/HDL Ratio: 2.1 ratio (ref 0.0–4.4)
Cholesterol, Total: 142 mg/dL (ref 100–199)
HDL: 67 mg/dL (ref >39)
LDL Chol Calc (NIH): 56 mg/dL (ref 0–99)
Triglycerides: 108 mg/dL (ref 0–149)
VLDL Cholesterol Cal: 19 mg/dL (ref 5–40)

## 2021-05-27 LAB — HEMOGLOBIN A1C
Est. average glucose Bld gHb Est-mCnc: 140 mg/dL
Hgb A1c MFr Bld: 6.5 % — ABNORMAL HIGH (ref 4.8–5.6)

## 2021-05-28 ENCOUNTER — Ambulatory Visit (INDEPENDENT_AMBULATORY_CARE_PROVIDER_SITE_OTHER): Payer: PPO

## 2021-05-28 ENCOUNTER — Ambulatory Visit
Admission: EM | Admit: 2021-05-28 | Discharge: 2021-05-28 | Disposition: A | Discharge status: Home / Self Care | Payer: PPO | Attending: Emergency Medicine | Admitting: Emergency Medicine

## 2021-05-28 DIAGNOSIS — M25552 Pain in left hip: Secondary | ICD-10-CM

## 2021-05-28 MED ORDER — TRAMADOL HCL 50 MG PO TABS
50.0000 mg | ORAL_TABLET | Freq: Four times a day (QID) | ORAL | 0 refills | Status: DC | PRN
Start: 2021-05-28 — End: 2021-11-25

## 2021-05-28 MED ORDER — PREDNISONE 20 MG PO TABS: 20.0000 mg | ORAL_TABLET | Freq: Every day | ORAL | 0 refills | Status: AC

## 2021-05-28 NOTE — ED Triage Notes (Signed)
Pt states woke up at 2:15am with lt anterior hip pain. Denies injury.

## 2021-05-28 NOTE — Discharge Instructions (Addendum)
X-ray normal Begin prednisone daily with food for 1 week Tramadol for severe pain-may cause drowsiness, do not drive or work after taking Alternate ice and heat Gentle stretching and movement Follow-up with sports medicine/Ortho if symptoms persisting

## 2021-05-28 NOTE — ED Provider Notes (Signed)
[facility]    CSN: [id number] Arrival date & time: 05/28/21  0800      History   Chief Complaint Chief Complaint  Patient presents with   Hip Pain    HPI Daisy Nelson is a 75 y.o. female history of A. Fib, hypertension, vitamin D deficiency, presenting today for evaluation of left hip pain.  Reports that she woke up at 2:15 AM out of her sleep with left-sided hip pain.  Has had significant pain with weightbearing since then concerned about fall due to amount of pain.  She denies any injury, recent fall, increase in activity heavy lifting or recent bending that may have triggered symptoms.  Reports similar episode approximately 5 weeks ago that also woke patient up out of sleep.  Symptoms resolved spontaneously within 24 hours.  Symptoms worse this time.  She denies any associated fevers, night sweats, unintentional weight loss.  Denies history of prior hip problems.  Denies radiation into the leg.  Denies numbness or tingling.  Denies any back pain.  Denies any urinary symptoms  HPI  Past Medical History:  Diagnosis Date   AF (atrial fibrillation) (HCC)    AF with RVR 01/2013   Allergy    Anxiety    Atrial fibrillation with RVR ([place]) 08/31/2015   Colon polyps    Complication of anesthesia 2011   colonscopy- "slow to awaken"   Depression    DJD (degenerative joint disease) of cervical spine    Dysrhythmia    afib   Family history of CABG    GERD (gastroesophageal reflux disease)    History of kidney stones    Hyperglycemia    Hyperlipidemia    Hypertension    Jaw pain 08/31/2015   Pneumonia    PONV (postoperative nausea and vomiting)    Wears glasses     Patient Active Problem List   Diagnosis Date Noted   Clostridium difficile infection 11/12/2020   Vitamin D deficiency 12/14/2019   Hypertension 06/12/2019   Environmental and seasonal allergies 10/24/2018   hx tob abuse;  30 yrs at roughly 1/2 ppd 05/11/2018   Drug-induced bradycardia-  rate  controlled on metoprolol by cards, patient ASx 05/10/2018   Mood disorder ([place]) 05/10/2018   Preretinal fibrosis, right eye 08/25/2016   Prediabetes 03/26/2015   PAF (paroxysmal atrial fibrillation) ([place]) 07/10/2013   Atrial fibrillation with rapid ventricular response ([place]) 02/06/2013   Family history of coronary artery bypass surgery 02/06/2013   GERD (gastroesophageal reflux disease) 10/25/2012   Allergic rhinitis 10/25/2012   Hyperlipidemia 10/25/2012   Anxiety 10/25/2012   DJD (degenerative joint disease) of cervical spine 10/25/2012   Nephrolithiasis 10/25/2012   Colon polyps 55/73/2202   Complication of anesthesia 11/30/2009    Past Surgical History:  Procedure Laterality Date   25 GAUGE PARS PLANA VITRECTOMY WITH 20 GAUGE MVR PORT FOR MACULAR HOLE Right 08/25/2016   Procedure: 25 GAUGE PARS PLANA VITRECTOMY; RIGHT EYE;  Surgeon: [name], MD;  Location: [place];  Service: Ophthalmology;  Laterality: Right;   ABDOMINAL HYSTERECTOMY  1991   CATARACT EXTRACTION W/ INTRAOCULAR LENS  IMPLANT, BILATERAL Bilateral 03/2013   CESAREAN SECTION  1976   COLONOSCOPY  2011   polyps   COLONOSCOPY WITH PROPOFOL N/A 08/19/2020   Procedure: COLONOSCOPY WITH PROPOFOL;  Surgeon: Doran Stabler, MD;  [facility];  Service: Gastroenterology;  Laterality: N/A;   EYE SURGERY     GANGLION CYST EXCISION Left    hand  GAS/FLUID EXCHANGE Right 08/25/2016   Procedure: GAS/FLUID EXCHANGE RIGHT EYE;  Surgeon: [name], MD;  Location: [place];  Service: Ophthalmology;  Laterality: Right;   IRIDECTOMY Right 08/25/2016   Procedure: PERIPHERAL IRIDECTOMY RIGHT EYE;  Surgeon: [name], MD;  Location: [place];  Service: Ophthalmology;  Laterality: Right;   LAPAROSCOPY  1976   MEMBRANE PEEL Right 08/25/2016   Procedure: MEMBRANE PEEL RIGHT EYE;  Surgeon: [name], MD;  Location: [place];  Service: Ophthalmology;  Laterality: Right;   NM MYOCAR PERF WALL MOTION  01/2013    lexiscan - no pharmacologically induced ischemia, fixed defects in apical segments of anterior septum & inferior lateral wall    PARS PLANA VITRECTOMY Right 08/25/2016   laser, gas injection, AC wash out. Membrane peel right eye   POLYPECTOMY N/A 08/19/2020   Procedure: POLYPECTOMY;  Surgeon: Doran Stabler, MD;  [facility];  Service: Gastroenterology;  Laterality: N/A;   TRANSTHORACIC ECHOCARDIOGRAM  01/2013   EF 55-60%, mod conc LVH; mild MR with mildly thickened leaflets; mild TR; PA peak pressure 56mmHg   TUBAL LIGATION  1977    OB History   No obstetric history on file.      Home Medications    Prior to Admission medications   Medication Sig Start Date End Date Taking? Authorizing Provider  predniSONE (DELTASONE) 20 MG tablet Take 1 tablet (20 mg total) by mouth daily with breakfast for 7 days. 05/28/21 06/04/21 Yes Angie Hogg C, PA-C  traMADol (ULTRAM) 50 MG tablet Take 1 tablet (50 mg total) by mouth every 6 (six) hours as needed. 05/28/21  Yes Ameah Chanda C, PA-C  calcium carbonate (OSCAL) 1500 (600 Ca) MG TABS tablet Take 600 mg of elemental calcium by mouth 2 (two) times daily with a meal.    [provider]  cimetidine (TAGAMET) 200 MG tablet Take 200 mg by mouth every evening.    [provider]  fexofenadine (ALLEGRA) 180 MG tablet Take 180 mg by mouth daily.    [provider]  flecainide (TAMBOCOR) 50 MG tablet Take 1 tablet (50 mg total) by mouth 2 (two) times daily. 07/18/20   Hilty, Nadean Corwin, MD  fluticasone (FLONASE) 50 MCG/ACT nasal spray Use 2 spray(s) in each nostril once daily 05/26/21   Abonza, Maritza, PA-C  metoprolol succinate (TOPROL-XL) 25 MG 24 hr tablet Take 1 tablet (25 mg total) by mouth daily. 04/21/21   Hilty, Nadean Corwin, MD  montelukast (SINGULAIR) 10 MG tablet TAKE 1 TABLET BY MOUTH AT BEDTIME 07/10/20   Abonza, Maritza, PA-C  rosuvastatin (CRESTOR) 20 MG tablet Take 1 tablet by mouth once daily 03/03/21    Abonza, Maritza, PA-C  sertraline (ZOLOFT) 100 MG tablet Take 1 tablet (100 mg total) by mouth daily. 01/20/21   Abonza, Maritza, PA-C  XARELTO 20 MG TABS tablet TAKE 1 TABLET BY MOUTH ONCE DAILY WITH SUPPER 01/20/21   Hilty, Nadean Corwin, MD    Family History Family History  Problem Relation Age of Onset   Stroke Mother    Diabetes Father    Heart disease Father        CABG   Arthritis Father        C6-7   Skin cancer Father        SCC   Arthritis Brother        s/p THR    Social History Social History   Tobacco Use   Smoking status: Former  Packs/day: 0.50    Years: 36.00    Pack years: 18.00    Types: Cigarettes    Start date: 03/15/1963    Quit date: 01/08/2005    Years since quitting: 16.3   Smokeless tobacco: Never  Vaping Use   Vaping Use: Never used  Substance Use Topics   Alcohol use: No   Drug use: No     Allergies   Adhesive [tape], Erythromycin, Naproxen, and Penicillins   Review of Systems Review of Systems  Constitutional:  Negative for fatigue and fever.  Eyes:  Negative for visual disturbance.  Respiratory:  Negative for shortness of breath.   Cardiovascular:  Negative for chest pain.  Gastrointestinal:  Negative for abdominal pain, nausea and vomiting.  Musculoskeletal:  Positive for arthralgias and gait problem. Negative for joint swelling.  Skin:  Negative for color change, rash and wound.  Neurological:  Negative for dizziness, weakness, light-headedness and headaches.    Physical Exam Triage Vital Signs ED Triage Vitals [05/28/21 0821]  Enc Vitals Group     BP 133/78     Pulse Rate (!) 58     Resp 18     Temp 98.1 F (36.7 C)     Temp Source Oral     SpO2 95 %     Weight      Height      Head Circumference      Peak Flow      Pain Score 6     Pain Loc      Pain Edu?      Excl. in [place]?    No data found.  Updated Vital Signs BP 133/78 (BP Location: Left Arm)   Pulse (!) 58   Temp 98.1 F (36.7 C) (Oral)   Resp 18   SpO2  95%   Visual Acuity Right Eye Distance:   Left Eye Distance:   Bilateral Distance:    Right Eye Near:   Left Eye Near:    Bilateral Near:     Physical Exam Vitals and nursing note reviewed.  Constitutional:      Appearance: She is well-developed.     Comments: No acute distress  HENT:     Head: Normocephalic and atraumatic.     Nose: Nose normal.  Eyes:     Conjunctiva/sclera: Conjunctivae normal.  Cardiovascular:     Rate and Rhythm: Normal rate.  Pulmonary:     Effort: Pulmonary effort is normal. No respiratory distress.  Abdominal:     General: There is no distension.  Musculoskeletal:        General: Normal range of motion.     Cervical back: Neck supple.     Comments: Left hip: Tender to palpation to anterior hip, does not extend into anterior thigh or laterally, no posterior lumbar/gluteal tenderness, discomfort experienced with logroll, pain with resisted hip flexion, slightly weak 4/5 compared to right, patellar reflex 2+ bilaterally  Ambulating with cane  Skin:    General: Skin is warm and dry.  Neurological:     Mental Status: She is alert and oriented to person, place, and time.     UC Treatments / Results  Labs (all labs ordered are listed, but only abnormal results are displayed) Labs Reviewed - No data to display  EKG   Radiology DG Hip Unilat With Pelvis 2-3 Views Left  Result Date: 05/28/2021 CLINICAL DATA:  Onset anterior left hip pain at 2:15 last night. No known injury. EXAM: DG HIP (WITH OR  WITHOUT PELVIS) 2-3V LEFT COMPARISON:  None. FINDINGS: There is no evidence of hip fracture or dislocation. There is no evidence of arthropathy or other focal bone abnormality. IMPRESSION: Negative exam. Electronically Signed   By: Inge Rise M.D.   On: 05/28/2021 09:28    Procedures Procedures (including critical care time)  Medications Ordered in UC Medications - No data to display  Initial Impression / Assessment and Plan / UC Course  I have  reviewed the triage vital signs and the nursing notes.  Pertinent labs & imaging results that were available during my care of the patient were reviewed by me and considered in my medical decision making (see chart for details).     X-ray negative for acute bony abnormality.  No sign of bone lesions, significant arthritis or avascular necrosis.  Will opt to treat with prednisone as patient is on Xarelto and tramadol for severe pain.  Recommending ice heat gentle stretching and movement and monitoring.  Follow-up with sports medicine-orthopedics if symptoms persisting.  Discussed strict return precautions. Patient verbalized understanding and is agreeable with plan.  Final Clinical Impressions(s) / UC Diagnoses   Final diagnoses:  Acute hip pain, left     Discharge Instructions      X-ray normal Begin prednisone daily with food for 1 week Tramadol for severe pain-may cause drowsiness, do not drive or work after taking Alternate ice and heat Gentle stretching and movement Follow-up with sports medicine/Ortho if symptoms persisting     ED Prescriptions     Medication Sig Dispense Auth. Provider   predniSONE (DELTASONE) 20 MG tablet Take 1 tablet (20 mg total) by mouth daily with breakfast for 7 days. 7 tablet Ossiel Marchio C, PA-C   traMADol (ULTRAM) 50 MG tablet Take 1 tablet (50 mg total) by mouth every 6 (six) hours as needed. 10 tablet Brando Taves, [place] C, PA-C      I have reviewed the PDMP during this encounter.   Janith Lima, [place] 05/28/21 [phone]

## 2021-06-03 ENCOUNTER — Other Ambulatory Visit: Payer: Self-pay | Admitting: Physician Assistant

## 2021-06-16 ENCOUNTER — Telehealth (HOSPITAL_COMMUNITY): Payer: Self-pay | Admitting: *Deleted

## 2021-06-16 NOTE — Telephone Encounter (Signed)
Patient given detailed instructions per Myocardial Perfusion Study Information Sheet for the test on 06/20/21 Patient notified to arrive 15 minutes early and that it is imperative to arrive on time for appointment to keep from having the test rescheduled.  If you need to cancel or reschedule your appointment, please call the office within 24 hours of your appointment. . Patient verbalized understanding. Daisy Nelson

## 2021-06-20 ENCOUNTER — Other Ambulatory Visit: Payer: Self-pay

## 2021-06-20 ENCOUNTER — Ambulatory Visit (HOSPITAL_COMMUNITY): Payer: PPO | Attending: Internal Medicine

## 2021-06-20 DIAGNOSIS — Z79899 Other long term (current) drug therapy: Secondary | ICD-10-CM | POA: Insufficient documentation

## 2021-06-20 DIAGNOSIS — Z5181 Encounter for therapeutic drug level monitoring: Secondary | ICD-10-CM | POA: Insufficient documentation

## 2021-06-20 DIAGNOSIS — I480 Paroxysmal atrial fibrillation: Secondary | ICD-10-CM | POA: Insufficient documentation

## 2021-06-20 LAB — MYOCARDIAL PERFUSION IMAGING
LV dias vol: 60 mL (ref 46–106)
LV sys vol: 14 mL
Peak HR: 75 {beats}/min
Rest HR: 56 {beats}/min
SDS: 2
SRS: 1
SSS: 2
TID: 1.02

## 2021-06-20 MED ORDER — TECHNETIUM TC 99M TETROFOSMIN IV KIT
10.2000 | PACK | Freq: Once | INTRAVENOUS | Status: AC | PRN
  Administered 2021-06-20: 10.2 via INTRAVENOUS
  Filled 2021-06-20: qty 11

## 2021-06-20 MED ORDER — REGADENOSON 0.4 MG/5ML IV SOLN
0.4000 mg | Freq: Once | INTRAVENOUS | Status: AC
  Administered 2021-06-20: 0.4 mg via INTRAVENOUS

## 2021-06-20 MED ORDER — TECHNETIUM TC 99M TETROFOSMIN IV KIT
31.4000 | PACK | Freq: Once | INTRAVENOUS | Status: AC | PRN
  Administered 2021-06-20: 31.4 via INTRAVENOUS
  Filled 2021-06-20: qty 32

## 2021-06-23 ENCOUNTER — Encounter: Payer: Self-pay | Admitting: Physician Assistant

## 2021-07-04 ENCOUNTER — Other Ambulatory Visit: Payer: Self-pay | Admitting: Physician Assistant

## 2021-07-15 ENCOUNTER — Other Ambulatory Visit: Payer: Self-pay | Admitting: Physician Assistant

## 2021-07-15 ENCOUNTER — Other Ambulatory Visit: Payer: Self-pay | Admitting: Internal Medicine

## 2021-07-15 DIAGNOSIS — F419 Anxiety disorder, unspecified: Secondary | ICD-10-CM

## 2021-07-15 DIAGNOSIS — F39 Unspecified mood [affective] disorder: Secondary | ICD-10-CM

## 2021-07-20 ENCOUNTER — Other Ambulatory Visit: Payer: Self-pay | Admitting: Internal Medicine

## 2021-07-21 NOTE — Telephone Encounter (Signed)
Prescription refill request for Xarelto received.  Indication:afib Last office visit:hilty 05/20/21 Weight:72.2kg Age:71fScr:0.96 CrCl:59.5

## 2021-08-04 ENCOUNTER — Encounter: Payer: Self-pay | Admitting: Physician Assistant

## 2021-08-06 ENCOUNTER — Encounter: Payer: Self-pay | Admitting: Physician Assistant

## 2021-08-06 ENCOUNTER — Other Ambulatory Visit: Payer: Self-pay

## 2021-08-06 ENCOUNTER — Ambulatory Visit (INDEPENDENT_AMBULATORY_CARE_PROVIDER_SITE_OTHER): Payer: PPO | Admitting: Physician Assistant

## 2021-08-06 VITALS — Ht 60.0 in | Wt 159.0 lb

## 2021-08-06 DIAGNOSIS — J309 Allergic rhinitis, unspecified: Secondary | ICD-10-CM | POA: Diagnosis not present

## 2021-08-06 DIAGNOSIS — K219 Gastro-esophageal reflux disease without esophagitis: Secondary | ICD-10-CM

## 2021-08-06 NOTE — Patient Instructions (Signed)
Allergic Rhinitis, Adult Allergic rhinitis is an allergic reaction that affects the mucous membrane inside the nose. The mucous membrane is the tissue that produces mucus. There are two types of allergic rhinitis: Seasonal. This type is also called hay fever and happens only during certain seasons. Perennial. This type can happen at any time of the year. Allergic rhinitis cannot be spread from person to person. This condition can be mild, moderate, or severe. It can develop at any age and may be outgrown. What are the causes? This condition is caused by allergens. These are things that can cause an allergic reaction. Allergens may differ for seasonal allergic rhinitis and perennial allergic rhinitis. Seasonal allergic rhinitis is triggered by pollen. Pollen can come from grasses, trees, and weeds. Perennial allergic rhinitis may be triggered by: Dust mites. Proteins in a pet's urine, saliva, or dander. Dander is dead skin cells from a pet. Smoke, mold, or car fumes. What increases the risk? You are more likely to develop this condition if you have a family history of allergies or other conditions related to allergies, including: Allergic conjunctivitis. This is inflammation of parts of the eyes and eyelids. Asthma. This condition affects the lungs and makes it hard to breathe. Atopic dermatitis or eczema. This is long term (chronic) inflammation of the skin. Food allergies. What are the signs or symptoms? Symptoms of this condition include: Sneezing or coughing. A stuffy nose (nasal congestion), itchy nose, or nasal discharge. Itchy eyes and tearing of the eyes. A feeling of mucus dripping down the back of your throat (postnasal drip). Trouble sleeping. Tiredness or fatigue. Headache. Sore throat. How is this diagnosed? This condition may be diagnosed with your symptoms, medical history, and physical exam. Your health care provider may check for related conditions, such  as: Asthma. Pink eye. This is eye inflammation caused by infection (conjunctivitis). Ear infection. Upper respiratory infection. This is an infection in the nose, throat, or upper airways. You may also have tests to find out which allergens trigger your symptoms. These may include skin tests or blood tests. How is this treated? There is no cure for this condition, but treatment can help control symptoms. Treatment may include: Taking medicines that block allergy symptoms, such as corticosteroids and antihistamines. Medicine may be given as a shot, nasal spray, or pill. Avoiding any allergens. Being exposed again and again to tiny amounts of allergens to help you build a defense against allergens (immunotherapy). This is done if other treatments have not helped. It may include: Allergy shots. These are injected medicines that have small amounts of allergen in them. Sublingual immunotherapy. This involves taking small doses of a medicine with allergen in it under your tongue. If these treatments do not work, your health care provider may prescribe newer, stronger medicines. Follow these instructions at home: Avoiding allergens Find out what you are allergic to and avoid those allergens. These are some things you can do to help avoid allergens: If you have perennial allergies: Replace carpet with wood, tile, or vinyl flooring. Carpet can trap dander and dust. Do not smoke. Do not allow smoking in your home. Change your heating and air conditioning filters at least once a month. If you have seasonal allergies, take these steps during allergy season: Keep windows closed as much as possible. Plan outdoor activities when pollen counts are lowest. Check pollen counts before you plan outdoor activities. When coming indoors, change clothing and shower before sitting on furniture or bedding. If you have a pet in  the house that produces allergens: Keep the pet out of the bedroom. Vacuum, sweep, and  dust regularly. General instructions Take over-the-counter and prescription medicines only as told by your health care provider. Drink enough fluid to keep your urine pale yellow. Keep all follow-up visits as told by your health care provider. This is important. Where to find more information American Academy of Allergy, Asthma & Immunology: www.aaaai.org Contact a health care provider if: You have a fever. You develop a cough that does not go away. You make whistling sounds when you breathe (wheeze). Your symptoms slow you down or stop you from doing your normal activities each day. Get help right away if: You have shortness of breath. This symptom may represent a serious problem that is an emergency. Do not wait to see if the symptom will go away. Get medical help right away. Call your local emergency services (911 in the [place] Do not drive yourself to the hospital. Summary Allergic rhinitis may be managed by taking medicines as directed and avoiding allergens. If you have seasonal allergies, keep windows closed as much as possible during allergy season. Contact your health care provider if you develop a fever or a cough that does not go away. This information is not intended to replace advice given to you by your health care provider. Make sure you discuss any questions you have with your health care provider. Document Revised: 01/05/2020 Document Reviewed: 11/14/2019 Elsevier Patient Education  2022 [employer].

## 2021-08-06 NOTE — Progress Notes (Signed)
Telehealth office visit note for Daisy Reid, PA-C- at Primary Care at [facility]   I connected with current patient today by telephone and verified that I am speaking with the correct person    Location of the patient: Home  Location of the provider: Office - This visit type was conducted due to national recommendations for restrictions regarding the COVID-19 Pandemic (e.g. social distancing) in an effort to limit this patient's exposure and mitigate transmission in our community.    - No physical exam could be performed with this format, beyond that communicated to [place] by the patient/ family members as noted.   - Additionally my office staff/ schedulers were to discuss with the patient that there may be a monetary charge related to this service, depending on their medical insurance.  My understanding is that patient understood and consented to proceed.     _________________________________________________________________________________   History of Present Illness: Patient calls in with c/o belching. States years ago was dx with laryngopharyngeal reflux by ENT and was started Nexium which was changed to Zantac and then Tagamet. States is constantly clearing her throat. Does report some runny nose and congestion. Taking Allegra and using nasal spray, which she has been on for several years. No chest pain, palpitations or dyspnea.      GAD 7 : Generalized Anxiety Score 08/06/2021 05/26/2021 06/12/2019 10/24/2018  Nervous, Anxious, on Edge 0 0 0 0  Control/stop worrying 0 1 0 1  Worry too much - different things 0 0 0 0  Trouble relaxing 0 0 0 0  Restless 0 0 0 0  Easily annoyed or irritable 0 0 0 0  Afraid - awful might happen 0 0 0 0  Total GAD 7 Score 0 1 0 1    Depression screen [facility] 2/9 08/06/2021 05/26/2021 01/20/2021 11/12/2020 09/13/2020  Decreased Interest 0 0 0 0 0  Down, Depressed, Hopeless 0 0 0 0 0  PHQ - 2 Score 0 0 0 0 0  Altered sleeping 0 '1 1 1 1  '$ Tired,  decreased energy 0 0 0 0 0  Change in appetite 0 0 0 0 0  Feeling bad or failure about yourself  0 0 0 0 0  Trouble concentrating 0 0 0 0 0  Moving slowly or fidgety/restless 0 0 0 0 0  Suicidal thoughts 0 0 0 0 0  PHQ-9 Score 0 '1 1 1 1  '$ Difficult doing work/chores - - - Not difficult at all Somewhat difficult  Some recent data might be hidden      Impression and Recommendations:     1. Allergic rhinitis, unspecified seasonality, unspecified trigger   2. Laryngopharyngeal reflux     Allergic rhinitis: -Symptoms likely combination of uncontrolled allergic rhinitis and reflux.  -Discussed with patient changing Allegra to different oral antihistamine such as Claritin or Zyrtec and using Flonase twice daily- 1 spray in each nostril. If symptoms fail to improve or worsen then recommend treatment adjustments for reflux. Will reassess symptoms and medication therapy in 4 weeks.  Laryngopharyngeal reflux: -Recommend to continue H2 therapy.  -Will reassess symptoms and medication therapy in 4 weeks.   - As part of my medical decision making, I reviewed the following data within the [place] History obtained from pt /family, CMA notes reviewed and incorporated if applicable, Labs reviewed, Radiograph/ tests reviewed if applicable and OV notes from prior OV's with me, as well as any other specialists she/he has  seen since seeing me last, were all reviewed and used in my medical decision making process today.    - Additionally, when appropriate, discussion had with patient regarding our treatment plan, and their biases/concerns about that plan were used in my medical decision making today.    - The patient agreed with the plan and demonstrated an understanding of the instructions.   No barriers to understanding were identified.     - The patient was advised to call back or seek an in-person evaluation if the symptoms worsen or if the condition fails to improve as  anticipated.   Return in about 4 weeks (around 09/03/2021) for allergic rhinitis, laryngo. reflux .    No orders of the defined types were placed in this encounter.   No orders of the defined types were placed in this encounter.   There are no discontinued medications.     Time spent on telephone encounter was 7 minutes.      [place] was signed into law in 2016 which includes the topic of electronic health records.  This provides immediate access to information in MyChart.  This includes consultation notes, operative notes, office notes, lab results and pathology reports.  If you have any questions about what you read please let us know at your next visit or call us at the office.  We are right here with you.   __________________________________________________________________________________     Patient Care Team    Relationship Specialty Notifications Start End  Daisy Nelson, [place] PCP - General   03/31/20   Pixie Casino, MD Consulting Physician Cardiology  11/14/13   [name], MD Consulting Physician Ophthalmology  07/18/16   Carol Ada, MD Consulting Physician Gastroenterology  09/22/16   Doran Stabler, MD Consulting Physician Gastroenterology  11/12/20      -Vitals obtained; medications/ allergies reconciled;  personal medical, social, Sx etc.histories were updated by CMA, reviewed by me and are reflected in chart   Patient Active Problem List   Diagnosis Date Noted   Clostridium difficile infection 11/12/2020   Vitamin D deficiency 12/14/2019   Hypertension 06/12/2019   Environmental and seasonal allergies 10/24/2018   hx tob abuse;  30 yrs at roughly 1/2 ppd 05/11/2018   Drug-induced bradycardia-  rate controlled on metoprolol by cards, patient ASx 05/10/2018   Mood disorder ([place]) 05/10/2018   Preretinal fibrosis, right eye 08/25/2016   Prediabetes 03/26/2015   PAF (paroxysmal atrial fibrillation) ([place]) 07/10/2013    Atrial fibrillation with rapid ventricular response ([place]) 02/06/2013   Family history of coronary artery bypass surgery 02/06/2013   GERD (gastroesophageal reflux disease) 10/25/2012   Allergic rhinitis 10/25/2012   Hyperlipidemia 10/25/2012   Anxiety 10/25/2012   DJD (degenerative joint disease) of cervical spine 10/25/2012   Nephrolithiasis 10/25/2012   Colon polyps [id number]   Complication of anesthesia 11/30/2009     Current Meds  Medication Sig   calcium carbonate (OSCAL) 1500 (600 Ca) MG TABS tablet Take 600 mg of elemental calcium by mouth 2 (two) times daily with a meal.   cimetidine (TAGAMET) 200 MG tablet Take 200 mg by mouth every evening.   fexofenadine (ALLEGRA) 180 MG tablet Take 180 mg by mouth daily.   flecainide (TAMBOCOR) 50 MG tablet Take 1 tablet by mouth twice daily   fluticasone (FLONASE) 50 MCG/ACT nasal spray Use 2 spray(s) in each nostril once daily   metoprolol succinate (TOPROL-XL) 25 MG 24 hr tablet Take 1 tablet (25 mg  total) by mouth daily.   montelukast (SINGULAIR) 10 MG tablet TAKE 1 TABLET BY MOUTH AT BEDTIME   rivaroxaban (XARELTO) 20 MG TABS tablet TAKE 1 TABLET BY MOUTH ONCE DAILY WITH SUPPER   rosuvastatin (CRESTOR) 20 MG tablet Take 1 tablet by mouth once daily   sertraline (ZOLOFT) 100 MG tablet Take 1 tablet by mouth once daily   traMADol (ULTRAM) 50 MG tablet Take 1 tablet (50 mg total) by mouth every 6 (six) hours as needed.     Allergies:  Allergies  Allergen Reactions   Adhesive [Tape]     Burn skin   Erythromycin     Gi intolerance - nausea, vomiting, and abd pains   Naproxen Nausea And Vomiting    Abdominal pain    Penicillins Hives    Has patient had a PCN reaction causing immediate rash, facial/tongue/throat swelling, SOB or lightheadedness with hypotension: UNKNOWN Has patient had a PCN reaction causing severe rash involving mucus membranes or skin necrosis: No Has patient had a PCN reaction that required hospitalization  No Has patient had a PCN reaction occurring within the last 10 years: No If all of the above answers are "NO", then may proceed with Cephalosporin use.      ROS:  See above HPI for pertinent positives and negatives   Objective:   Height 5' (1.524 m), weight 159 lb (72.1 kg).  (if some vitals are omitted, this means that patient was UNABLE to obtain them. ) General: A & O * 3; sounds in no acute distress  Respiratory: speaking in full sentences, no conversational dyspnea Psych: insight appears good, mood- appears full

## 2021-09-01 ENCOUNTER — Other Ambulatory Visit: Payer: Self-pay | Admitting: Physician Assistant

## 2021-09-22 ENCOUNTER — Other Ambulatory Visit: Payer: Self-pay | Admitting: Physician Assistant

## 2021-09-22 DIAGNOSIS — J309 Allergic rhinitis, unspecified: Secondary | ICD-10-CM

## 2021-10-06 ENCOUNTER — Other Ambulatory Visit: Payer: Self-pay | Admitting: Internal Medicine

## 2021-10-13 ENCOUNTER — Other Ambulatory Visit: Payer: Self-pay | Admitting: Physician Assistant

## 2021-10-13 DIAGNOSIS — F419 Anxiety disorder, unspecified: Secondary | ICD-10-CM

## 2021-10-13 DIAGNOSIS — F39 Unspecified mood [affective] disorder: Secondary | ICD-10-CM

## 2021-11-25 ENCOUNTER — Encounter: Payer: Self-pay | Admitting: Physician Assistant

## 2021-11-25 ENCOUNTER — Other Ambulatory Visit: Payer: Self-pay

## 2021-11-25 ENCOUNTER — Ambulatory Visit (INDEPENDENT_AMBULATORY_CARE_PROVIDER_SITE_OTHER): Payer: PPO | Admitting: Physician Assistant

## 2021-11-25 VITALS — BP 126/69 | HR 53 | Temp 97.4°F | Ht 60.0 in | Wt 160.0 lb

## 2021-11-25 DIAGNOSIS — R61 Generalized hyperhidrosis: Secondary | ICD-10-CM | POA: Diagnosis not present

## 2021-11-25 DIAGNOSIS — E785 Hyperlipidemia, unspecified: Secondary | ICD-10-CM

## 2021-11-25 DIAGNOSIS — E559 Vitamin D deficiency, unspecified: Secondary | ICD-10-CM | POA: Diagnosis not present

## 2021-11-25 DIAGNOSIS — R7303 Prediabetes: Secondary | ICD-10-CM | POA: Diagnosis not present

## 2021-11-25 DIAGNOSIS — Z23 Encounter for immunization: Secondary | ICD-10-CM

## 2021-11-25 DIAGNOSIS — J309 Allergic rhinitis, unspecified: Secondary | ICD-10-CM

## 2021-11-25 DIAGNOSIS — F39 Unspecified mood [affective] disorder: Secondary | ICD-10-CM

## 2021-11-25 NOTE — Assessment & Plan Note (Signed)
-  Stable. -Recommend to continue oral anti-histamine and nasal spray. Advised resuming montelukast if symptoms become uncontrolled. Patient verbalized understanding.

## 2021-11-25 NOTE — Addendum Note (Signed)
Addended by: Aron Baba on: 11/25/2021 05:12 PM   Modules accepted: Orders

## 2021-11-25 NOTE — Assessment & Plan Note (Signed)
-  Last Vitamin D wnl's. Will repeat Vitamin D. -Recommend to continue Vitamin D supplement.

## 2021-11-25 NOTE — Assessment & Plan Note (Signed)
-  Will repeat A1c. -Recommend to follow a low carbohydrate and glucose diet.

## 2021-11-25 NOTE — Patient Instructions (Signed)
Heart-Healthy Eating Plan Heart-healthy meal planning includes: Eating less unhealthy fats. Eating more healthy fats. Making other changes in your diet. Talk with your doctor or a diet specialist (dietitian) to create an eating plan that is right for you. What is my plan? Your doctor may recommend an eating plan that includes: Total fat: ______% or less of total calories a day. Saturated fat: ______% or less of total calories a day. Cholesterol: less than _________mg a day. What are tips for following this plan? Cooking Avoid frying your food. Try to bake, boil, grill, or broil it instead. You can also reduce fat by: Removing the skin from poultry. Removing all visible fats from meats. Steaming vegetables in water or broth. Meal planning  At meals, divide your plate into four equal parts: Fill one-half of your plate with vegetables and green salads. Fill one-fourth of your plate with whole grains. Fill one-fourth of your plate with lean protein foods. Eat 4-5 servings of vegetables per day. A serving of vegetables is: 1 cup of raw or cooked vegetables. 2 cups of raw leafy greens. Eat 4-5 servings of fruit per day. A serving of fruit is: 1 medium whole fruit.  cup of dried fruit.  cup of fresh, frozen, or canned fruit.  cup of 100% fruit juice. Eat more foods that have soluble fiber. These are apples, broccoli, carrots, beans, peas, and barley. Try to get 20-30 g of fiber per day. Eat 4-5 servings of nuts, legumes, and seeds per week: 1 serving of dried beans or legumes equals  cup after being cooked. 1 serving of nuts is  cup. 1 serving of seeds equals 1 tablespoon. General information Eat more home-cooked food. Eat less restaurant, buffet, and fast food. Limit or avoid alcohol. Limit foods that are high in starch and sugar. Avoid fried foods. Lose weight if you are overweight. Keep track of how much salt (sodium) you eat. This is important if you have high blood  pressure. Ask your doctor to tell you more about this. Try to add vegetarian meals each week. Fats Choose healthy fats. These include olive oil and canola oil, flaxseeds, walnuts, almonds, and seeds. Eat more omega-3 fats. These include salmon, mackerel, sardines, tuna, flaxseed oil, and ground flaxseeds. Try to eat fish at least 2 times each week. Check food labels. Avoid foods with trans fats or high amounts of saturated fat. Limit saturated fats. These are often found in animal products, such as meats, butter, and cream. These are also found in plant foods, such as palm oil, palm kernel oil, and coconut oil. Avoid foods with partially hydrogenated oils in them. These have trans fats. Examples are stick margarine, some tub margarines, cookies, crackers, and other baked goods. What foods can I eat? Fruits All fresh, canned (in natural juice), or frozen fruits. Vegetables Fresh or frozen vegetables (raw, steamed, roasted, or grilled). Green salads. Grains Most grains. Choose whole wheat and whole grains most of the time. Rice and pasta, including brown rice and pastas made with whole wheat. Meats and other proteins Lean, well-trimmed beef, veal, pork, and lamb. Chicken and turkey without skin. All fish and shellfish. Wild duck, rabbit, pheasant, and venison. Egg whites or low-cholesterol egg substitutes. Dried beans, peas, lentils, and tofu. Seeds and most nuts. Dairy Low-fat or nonfat cheeses, including ricotta and mozzarella. Skim or 1% milk that is liquid, powdered, or evaporated. Buttermilk that is made with low-fat milk. Nonfat or low-fat yogurt. Fats and oils Non-hydrogenated (trans-free) margarines. Vegetable oils, including   soybean, sesame, sunflower, olive, peanut, safflower, corn, canola, and cottonseed. Salad dressings or mayonnaise made with a vegetable oil. Beverages Mineral water. Coffee and tea. Diet carbonated beverages. Sweets and desserts Sherbet, gelatin, and fruit ice.  Small amounts of dark chocolate. Limit all sweets and desserts. Seasonings and condiments All seasonings and condiments. The items listed above may not be a complete list of foods and drinks you can eat. Contact a dietitian for more options. What foods should I avoid? Fruits Canned fruit in heavy syrup. Fruit in cream or butter sauce. Fried fruit. Limit coconut. Vegetables Vegetables cooked in cheese, cream, or butter sauce. Fried vegetables. Grains Breads that are made with saturated or trans fats, oils, or whole milk. Croissants. Sweet rolls. Donuts. High-fat crackers, such as cheese crackers. Meats and other proteins Fatty meats, such as hot dogs, ribs, sausage, bacon, rib-eye roast or steak. High-fat deli meats, such as salami and bologna. Caviar. Domestic duck and goose. Organ meats, such as liver. Dairy Cream, sour cream, cream cheese, and creamed cottage cheese. Whole-milk cheeses. Whole or 2% milk that is liquid, evaporated, or condensed. Whole buttermilk. Cream sauce or high-fat cheese sauce. Yogurt that is made from whole milk. Fats and oils Meat fat, or shortening. Cocoa butter, hydrogenated oils, palm oil, coconut oil, palm kernel oil. Solid fats and shortenings, including bacon fat, salt pork, lard, and butter. Nondairy cream substitutes. Salad dressings with cheese or sour cream. Beverages Regular sodas and juice drinks with added sugar. Sweets and desserts Frosting. Pudding. Cookies. Cakes. Pies. Milk chocolate or white chocolate. Buttered syrups. Full-fat ice cream or ice cream drinks. The items listed above may not be a complete list of foods and drinks to avoid. Contact a dietitian for more information. Summary Heart-healthy meal planning includes eating less unhealthy fats, eating more healthy fats, and making other changes in your diet. Eat a balanced diet. This includes fruits and vegetables, low-fat or nonfat dairy, lean protein, nuts and legumes, whole grains, and  heart-healthy oils and fats. This information is not intended to replace advice given to you by your health care provider. Make sure you discuss any questions you have with your health care provider. Document Revised: 03/27/2021 Document Reviewed: 03/27/2021 Elsevier Patient Education  2022 [employer]  

## 2021-11-25 NOTE — Assessment & Plan Note (Signed)
>>  ASSESSMENT AND PLAN FOR PREDIABETES WRITTEN ON 11/25/2021  8:45 AM BY ABONZA, MARITZA, PA-C  -Will repeat A1c. -Recommend to follow a low carbohydrate and glucose diet.

## 2021-11-25 NOTE — Progress Notes (Signed)
Established Patient Office Visit  Subjective:  Patient ID: Daisy Nelson, female    DOB: 30-May-1946  Age: 75 y.o. MRN: [id number]  CC:  Chief Complaint  Patient presents with   Follow-up    PreDM, Mood    Hyperlipidemia    HPI Daisy Nelson presents for follow up on hyperlipidemia, prediabetes and mood management. Patient has c/o night sweats from her upper torso to neck. Denies drenched sheets, unintentional weight loss, fever or chills.  HLD: Pt taking medication as directed without issues.   Mood: Patient reports medication compliance. States mood has been stable since re-starting medication. No SI/HI.  Prediabetes: Patient denies increased thirst or urination. Reports diet has not been the best with the Holidays.   Allergies: Patient reports has not experienced a flare-up. Taking Claritin and using nasal spray. States stopped taking montelukast a few months ago.   Past Medical History:  Diagnosis Date   AF (atrial fibrillation) (HCC)    AF with RVR 01/2013   Allergy    Anxiety    Atrial fibrillation with RVR ([place]) 08/31/2015   Colon polyps    Complication of anesthesia 2011   colonscopy- "slow to awaken"   Depression    DJD (degenerative joint disease) of cervical spine    Dysrhythmia    afib   Family history of CABG    GERD (gastroesophageal reflux disease)    History of kidney stones    Hyperglycemia    Hyperlipidemia    Hypertension    Jaw pain 08/31/2015   Pneumonia    PONV (postoperative nausea and vomiting)    Wears glasses     Past Surgical History:  Procedure Laterality Date   25 GAUGE PARS PLANA VITRECTOMY WITH 20 GAUGE MVR PORT FOR MACULAR HOLE Right 08/25/2016   Procedure: 25 GAUGE PARS PLANA VITRECTOMY; RIGHT EYE;  Surgeon: [name], MD;  Location: [facility];  Service: Ophthalmology;  Laterality: Right;   ABDOMINAL HYSTERECTOMY  1991   CATARACT EXTRACTION W/ INTRAOCULAR LENS  IMPLANT, BILATERAL Bilateral 03/2013   CESAREAN SECTION  1976    COLONOSCOPY  2011   polyps   COLONOSCOPY WITH PROPOFOL N/A 08/19/2020   Procedure: COLONOSCOPY WITH PROPOFOL;  Surgeon: Doran Stabler, MD;  [facility];  Service: Gastroenterology;  Laterality: N/A;   EYE SURGERY     GANGLION CYST EXCISION Left    hand   GAS/FLUID EXCHANGE Right 08/25/2016   Procedure: GAS/FLUID EXCHANGE RIGHT EYE;  Surgeon: [name], MD;  Location: [place];  Service: Ophthalmology;  Laterality: Right;   IRIDECTOMY Right 08/25/2016   Procedure: PERIPHERAL IRIDECTOMY RIGHT EYE;  Surgeon: [name], MD;  Location: [place];  Service: Ophthalmology;  Laterality: Right;   LAPAROSCOPY  1976   MEMBRANE PEEL Right 08/25/2016   Procedure: MEMBRANE PEEL RIGHT EYE;  Surgeon: [name], MD;  Location: [facility];  Service: Ophthalmology;  Laterality: Right;   NM MYOCAR PERF WALL MOTION  01/2013   lexiscan - no pharmacologically induced ischemia, fixed defects in apical segments of anterior septum & inferior lateral wall    PARS PLANA VITRECTOMY Right 08/25/2016   laser, gas injection, AC wash out. Membrane peel right eye   POLYPECTOMY N/A 08/19/2020   Procedure: POLYPECTOMY;  Surgeon: Doran Stabler, MD;  [facility];  Service: Gastroenterology;  Laterality: N/A;   TRANSTHORACIC ECHOCARDIOGRAM  01/2013   EF 55-60%, mod conc LVH; mild MR with mildly thickened leaflets; mild TR;  PA peak pressure 3mHg   TUBAL LIGATION  1977    Family History  Problem Relation Age of Onset   Stroke Mother    Diabetes Father    Heart disease Father        CABG   Arthritis Father        C6-7   Skin cancer Father        SCC   Arthritis Brother        s/p THR    Social History   Socioeconomic History   Marital status: Married    Spouse name: RHerbie [place]  Number of children: 1   Years of education: 12   Highest education level: Not on file  Occupational History   Occupation: iMedical illustrator Tobacco Use   Smoking status: Former    Packs/day: 0.50     Years: 36.00    Pack years: 18.00    Types: Cigarettes    Start date: 03/15/1963    Quit date: 01/08/2005    Years since quitting: 16.8   Smokeless tobacco: Never  Vaping Use   Vaping Use: Never used  Substance and Sexual Activity   Alcohol use: No   Drug use: No   Sexual activity: Yes    Partners: Male    Birth control/protection: Surgical  Other Topics Concern   Not on file  Social History Narrative   Lives with her husband and their cat, [place]   Social Determinants of Health   Financial Resource Strain: Not on file  Food Insecurity: Not on file  Transportation Needs: Not on file  Physical Activity: Not on file  Stress: Not on file  Social Connections: Not on file  Intimate Partner Violence: Not on file    Outpatient Medications Prior to Visit  Medication Sig Dispense Refill   calcium carbonate (OSCAL) 1500 (600 Ca) MG TABS tablet Take 600 mg of elemental calcium by mouth 2 (two) times daily with a meal.     cimetidine (TAGAMET) 200 MG tablet Take 200 mg by mouth every evening.     flecainide (TAMBOCOR) 50 MG tablet Take 1 tablet by mouth twice daily 180 tablet 0   fluticasone (FLONASE) 50 MCG/ACT nasal spray Use 2 spray(s) in each nostril once daily 48 g 0   Loratadine 10 MG CAPS Claritin     metoprolol succinate (TOPROL-XL) 25 MG 24 hr tablet Take 1 tablet (25 mg total) by mouth daily. 90 tablet 3   montelukast (SINGULAIR) 10 MG tablet TAKE 1 TABLET BY MOUTH AT BEDTIME 90 tablet 0   rivaroxaban (XARELTO) 20 MG TABS tablet TAKE 1 TABLET BY MOUTH ONCE DAILY WITH SUPPER 90 tablet 1   rosuvastatin (CRESTOR) 20 MG tablet Take 1 tablet by mouth once daily 90 tablet 0   sertraline (ZOLOFT) 100 MG tablet Take 1 tablet by mouth once daily 90 tablet 1   fexofenadine (ALLEGRA) 180 MG tablet Take 180 mg by mouth daily.     traMADol (ULTRAM) 50 MG tablet Take 1 tablet (50 mg total) by mouth every 6 (six) hours as needed. 10 tablet 0   No facility-administered medications prior  to visit.    Allergies  Allergen Reactions   Adhesive [Tape]     Burn skin   Erythromycin     Gi intolerance - nausea, vomiting, and abd pains   Naproxen Nausea And Vomiting    Abdominal pain    Penicillins Hives    Has patient had a PCN reaction causing immediate  rash, facial/tongue/throat swelling, SOB or lightheadedness with hypotension: UNKNOWN Has patient had a PCN reaction causing severe rash involving mucus membranes or skin necrosis: No Has patient had a PCN reaction that required hospitalization No Has patient had a PCN reaction occurring within the last 10 years: No If all of the above answers are "NO", then may proceed with Cephalosporin use.     ROS Review of Systems Review of Systems:  A fourteen system review of systems was performed and found to be positive as per HPI.   Objective:    Physical Exam General:  Pleasant and cooperative, in no acute distress  Neuro:  Alert and oriented,  extra-ocular muscles intact  HEENT:  Normocephalic, atraumatic, neck supple Skin:  no gross rash, warm, pink. Cardiac:  RRR, S1 S2 Respiratory: CTA B/L w/o wheezing, crackles or rales. Vascular:  Ext warm, no cyanosis apprec.; cap RF less 2 sec. Psych:  No HI/SI, judgement and insight good, Euthymic mood. Full Affect.  BP 126/69    Pulse (!) 53    Temp (!) 97.4 F (36.3 C)    Ht 5' (1.524 m)    Wt 160 lb (72.6 kg)    SpO2 96%    BMI 31.25 kg/m  Wt Readings from Last 3 Encounters:  11/25/21 160 lb (72.6 kg)  08/06/21 159 lb (72.1 kg)  06/20/21 159 lb (72.1 kg)     Health Maintenance Due  Topic Date Due   TETANUS/TDAP  08/17/2018   COVID-19 Vaccine (3 - Pfizer risk series) 03/12/2020   INFLUENZA VACCINE  06/30/2021    There are no preventive care reminders to display for this patient.  Lab Results  Component Value Date   TSH 1.950 01/20/2021   Lab Results  Component Value Date   WBC 4.8 05/26/2021   HGB 12.6 05/26/2021   HCT 38.5 05/26/2021   MCV 85 05/26/2021    PLT 174 05/26/2021   Lab Results  Component Value Date   NA 139 05/26/2021   K 4.6 05/26/2021   CO2 24 05/26/2021   GLUCOSE 134 (H) 05/26/2021   BUN 16 05/26/2021   CREATININE 0.96 05/26/2021   BILITOT 0.3 05/26/2021   ALKPHOS 104 05/26/2021   AST 17 05/26/2021   ALT 14 05/26/2021   PROT 6.6 05/26/2021   ALBUMIN 4.2 05/26/2021   CALCIUM 9.5 05/26/2021   ANIONGAP 7 08/25/2016   EGFR 62 05/26/2021   GFR 70.35 10/11/2020   Lab Results  Component Value Date   CHOL 142 05/26/2021   Lab Results  Component Value Date   HDL 67 05/26/2021   Lab Results  Component Value Date   LDLCALC 56 05/26/2021   Lab Results  Component Value Date   TRIG 108 05/26/2021   Lab Results  Component Value Date   CHOLHDL 2.1 05/26/2021   Lab Results  Component Value Date   HGBA1C 6.5 (H) 05/26/2021   Depression screen PHQ 2/9 11/25/2021 08/06/2021 05/26/2021 01/20/2021 11/12/2020  Decreased Interest 0 0 0 0 0  Down, Depressed, Hopeless 0 0 0 0 0  PHQ - 2 Score 0 0 0 0 0  Altered sleeping 1 0 _0 Tired, decreased energy 0 0 0 0 0  Change in appetite 0 0 0 0 0  Feeling bad or failure about yourself  0 0 0 0 0  Trouble concentrating 0 0 0 0 0  Moving slowly or fidgety/restless 0 0 0 0 0  Suicidal thoughts 0 0 0 0 0  PHQ-9 Score 1 0 _0 Difficult doing work/chores Not difficult at all - - - Not difficult at all  Some recent data might be hidden   GAD 7 : Generalized Anxiety Score 11/25/2021 08/06/2021 05/26/2021 06/12/2019  Nervous, Anxious, on Edge 0 0 0 0  Control/stop worrying 0 0 1 0  Worry too much - different things 0 0 0 0  Trouble relaxing 0 0 0 0  Restless 0 0 0 0  Easily annoyed or irritable 0 0 0 0  Afraid - awful might happen 0 0 0 0  Total GAD 7 Score 0 0 1 0  Anxiety Difficulty Not difficult at all - - -        Assessment & Plan:   Problem List Items Addressed This Visit       Respiratory   Allergic rhinitis    -Stable. -Recommend to continue oral  anti-histamine and nasal spray. Advised resuming montelukast if symptoms become uncontrolled. Patient verbalized understanding.        Other   Hyperlipidemia - Primary (Chronic)    -Last lipid panel: LDL 56 -Will repeat lipid panel and hepatic function. -Recommend to follow a low fat diet. -Will continue to monitor.      Relevant Orders   Lipid Profile   Prediabetes    -Will repeat A1c. -Recommend to follow a low carbohydrate and glucose diet.      Relevant Orders   HgB A1c   Mood disorder ([place])    -Stable. -Continue current medication regimen. -Will continue to monitor.      Vitamin D deficiency    -Last Vitamin D wnl's. Will repeat Vitamin D. -Recommend to continue Vitamin D supplement.      Relevant Orders   Vitamin D (25 hydroxy)   Other Visit Diagnoses     Night sweats       Relevant Orders   CBC w/Diff   Comp Met (CMET)   TSH   HgB A1c      Night sweats: -Discussed with patient potential etiologies including endocrine and infectious. Will place lab orders for further evaluation.   No orders of the defined types were placed in this encounter.   Follow-up: Return in about 6 months (around 05/26/2022) for [place].    Lorrene Reid, PA-C

## 2021-11-25 NOTE — Assessment & Plan Note (Signed)
-  Last lipid panel: LDL 56 -Will repeat lipid panel and hepatic function. -Recommend to follow a low fat diet. -Will continue to monitor.

## 2021-11-25 NOTE — Assessment & Plan Note (Signed)
-  Stable. -Continue current medication regimen.  -Will continue to monitor. 

## 2021-11-26 LAB — CBC WITH DIFFERENTIAL/PLATELET
Basophils Absolute: 0 10*3/uL (ref 0.0–0.2)
Basos: 1 %
EOS (ABSOLUTE): 0.1 10*3/uL (ref 0.0–0.4)
Eos: 3 %
Hematocrit: 39.1 % (ref 34.0–46.6)
Hemoglobin: 12.5 g/dL (ref 11.1–15.9)
Immature Grans (Abs): 0 10*3/uL (ref 0.0–0.1)
Immature Granulocytes: 0 %
Lymphocytes Absolute: 0.8 10*3/uL (ref 0.7–3.1)
Lymphs: 18 %
MCH: 27.2 pg (ref 26.6–33.0)
MCHC: 32 g/dL (ref 31.5–35.7)
MCV: 85 fL (ref 79–97)
Monocytes Absolute: 0.6 10*3/uL (ref 0.1–0.9)
Monocytes: 14 %
Neutrophils Absolute: 2.9 10*3/uL (ref 1.4–7.0)
Neutrophils: 64 %
Platelets: 165 10*3/uL (ref 150–450)
RBC: 4.59 x10E6/uL (ref 3.77–5.28)
RDW: 14.3 % (ref 11.7–15.4)
WBC: 4.5 10*3/uL (ref 3.4–10.8)

## 2021-11-26 LAB — LIPID PANEL
Chol/HDL Ratio: 2.3 ratio (ref 0.0–4.4)
Cholesterol, Total: 141 mg/dL (ref 100–199)
HDL: 61 mg/dL (ref >39)
LDL Chol Calc (NIH): 55 mg/dL (ref 0–99)
Triglycerides: 148 mg/dL (ref 0–149)
VLDL Cholesterol Cal: 25 mg/dL (ref 5–40)

## 2021-11-26 LAB — VITAMIN D 25 HYDROXY (VIT D DEFICIENCY, FRACTURES): Vit D, 25-Hydroxy: 68.8 ng/mL (ref 30.0–100.0)

## 2021-11-26 LAB — COMPREHENSIVE METABOLIC PANEL
ALT: 16 IU/L (ref 0–32)
AST: 20 IU/L (ref 0–40)
Albumin/Globulin Ratio: 1.9 (ref 1.2–2.2)
Albumin: 4.4 g/dL (ref 3.7–4.7)
Alkaline Phosphatase: 82 IU/L (ref 44–121)
BUN/Creatinine Ratio: 12 (ref 12–28)
BUN: 11 mg/dL (ref 8–27)
Bilirubin Total: 0.3 mg/dL (ref 0.0–1.2)
CO2: 28 mmol/L (ref 20–29)
Calcium: 9.9 mg/dL (ref 8.7–10.3)
Chloride: 104 mmol/L (ref 96–106)
Creatinine, Ser: 0.9 mg/dL (ref 0.57–1.00)
Globulin, Total: 2.3 g/dL (ref 1.5–4.5)
Glucose: 126 mg/dL — ABNORMAL HIGH (ref 70–99)
Potassium: 4.4 mmol/L (ref 3.5–5.2)
Sodium: 142 mmol/L (ref 134–144)
Total Protein: 6.7 g/dL (ref 6.0–8.5)
eGFR: 68 mL/min/{1.73_m2} (ref >59)

## 2021-11-26 LAB — HEMOGLOBIN A1C
Est. average glucose Bld gHb Est-mCnc: 146 mg/dL
Hgb A1c MFr Bld: 6.7 % — ABNORMAL HIGH (ref 4.8–5.6)

## 2021-11-26 LAB — TSH: TSH: 1.79 u[IU]/mL (ref 0.450–4.500)

## 2021-11-27 ENCOUNTER — Encounter: Payer: Self-pay | Admitting: Physician Assistant

## 2021-12-01 ENCOUNTER — Other Ambulatory Visit: Payer: Self-pay | Admitting: Physician Assistant

## 2021-12-31 LAB — HM DIABETES EYE EXAM

## 2022-01-12 ENCOUNTER — Other Ambulatory Visit: Payer: Self-pay | Admitting: Internal Medicine

## 2022-01-12 NOTE — Telephone Encounter (Signed)
Prescription refill request for Xarelto received.  Indication:Afib Last office visit:6/22 Weight:72.6 kg Age:76 Scr:0.9 CrCl:63.81 ml/min  Prescription refilled

## 2022-01-23 ENCOUNTER — Other Ambulatory Visit: Payer: Self-pay | Admitting: Physician Assistant

## 2022-01-23 DIAGNOSIS — J309 Allergic rhinitis, unspecified: Secondary | ICD-10-CM

## 2022-01-30 ENCOUNTER — Encounter: Payer: Self-pay | Admitting: Physician Assistant

## 2022-02-23 NOTE — Progress Notes (Signed)
? ?Established Patient Office Visit ? ?Subjective:  ?Patient ID: Daisy Nelson, female    DOB: Apr 08, 1946  Age: 76 y.o. MRN: [id number] ? ?CC:  ?Chief Complaint  ?Patient presents with  ? Follow-up  ? Diabetes  ? ? ?HPI ?Daisy Nelson presents for follow-up on new onset diabetes mellitus, hyperlipidemia and mood. Reports tripping on the steps of the stairs which cause her to fall. Has been more careful about picking her feet up and watching her step. Denies LOC or head injury with any of her falls. ? ?Diabetes: Pt denies increased urination or thirst. Pt reports medication compliance. No hypoglycemic events. Patient reports has reduced sugar and starches.  ? ?HLD: Pt taking medication as directed without issues. Patient reports stays active with light house work. ? ?Mood: Patient reports mood has been stable. Denies labile mood, SI/HI. ? ?Allergies: Reports taking loratadine. Takes Singulair when needed. Reports had a scratchy throat one day which resolved the next day. No sneezing, sore throat, watery or itchy eyes.  ? ?Past Medical History:  ?Diagnosis Date  ? AF (atrial fibrillation) ([place])   ? AF with RVR 01/2013  ? Allergy   ? Anxiety   ? Atrial fibrillation with RVR ([place]) 08/31/2015  ? Colon polyps   ? Complication of anesthesia 2011  ? colonscopy- "slow to awaken"  ? Depression   ? DJD (degenerative joint disease) of cervical spine   ? Dysrhythmia   ? afib  ? Family history of CABG   ? GERD (gastroesophageal reflux disease)   ? History of kidney stones   ? Hyperglycemia   ? Hyperlipidemia   ? Hypertension   ? Jaw pain 08/31/2015  ? New onset type 2 diabetes mellitus ([place]) 02/24/2022  ? Pneumonia   ? PONV (postoperative nausea and vomiting)   ? Wears glasses   ? ? ?Past Surgical History:  ?Procedure Laterality Date  ? 25 GAUGE PARS PLANA VITRECTOMY WITH 20 GAUGE MVR PORT FOR MACULAR HOLE Right 08/25/2016  ? Procedure: [place] VITRECTOMY; RIGHT EYE;  Surgeon: [name], MD;  Location: [facility];   Service: Ophthalmology;  Laterality: Right;  ? ABDOMINAL HYSTERECTOMY  1991  ? CATARACT EXTRACTION W/ INTRAOCULAR LENS  IMPLANT, BILATERAL Bilateral 03/2013  ? [place]  ? COLONOSCOPY  2011  ? polyps  ? COLONOSCOPY WITH PROPOFOL N/A 08/19/2020  ? Procedure: COLONOSCOPY WITH PROPOFOL;  Surgeon: Doran Stabler, MD;  [facility];  Service: Gastroenterology;  Laterality: N/A;  ? EYE SURGERY    ? GANGLION CYST EXCISION Left   ? hand  ? GAS/FLUID EXCHANGE Right 08/25/2016  ? Procedure: GAS/FLUID EXCHANGE RIGHT EYE;  Surgeon: [name], MD;  Location: [facility];  Service: Ophthalmology;  Laterality: Right;  ? IRIDECTOMY Right 08/25/2016  ? Procedure: PERIPHERAL IRIDECTOMY RIGHT EYE;  Surgeon: [name], MD;  Location: [place] [facility];  Service: Ophthalmology;  Laterality: Right;  ? LAPAROSCOPY  1976  ? MEMBRANE PEEL Right 08/25/2016  ? Procedure: MEMBRANE PEEL RIGHT EYE;  Surgeon: [name], MD;  Location: [facility];  Service: Ophthalmology;  Laterality: Right;  ? NM MYOCAR PERF WALL MOTION  01/2013  ? lexiscan - no pharmacologically induced ischemia, fixed defects in apical segments of anterior septum & inferior lateral wall   ? PARS PLANA VITRECTOMY Right 08/25/2016  ? laser, gas injection, AC wash out. Membrane peel right eye  ? POLYPECTOMY N/A 08/19/2020  ? Procedure: POLYPECTOMY;  Surgeon: Wilfrid Lund  L III, MD;  Location[facility];  Service: Gastroenterology;  Laterality: N/A;  ? TRANSTHORACIC ECHOCARDIOGRAM  01/2013  ? EF 55-60%, mod conc LVH; mild MR with mildly thickened leaflets; mild TR; PA peak pressure 66mHg  ? TUBAL LIGATION  1977  ? ? ?Family History  ?Problem Relation Age of Onset  ? Stroke Mother   ? Diabetes Father   ? Heart disease Father   ?     CABG  ? Arthritis Father   ?     C6-7  ? Skin cancer Father   ?     SCC  ? Arthritis Brother   ?     s/p THR  ? ? ?Social History  ? ?Socioeconomic History  ? Marital status: Married  ?  Spouse name: RHerbie [place] ? Number of children: 1  ?  Years of education: 157 ? Highest education level: Not on file  ?Occupational History  ? Occupation: iMedical illustrator ?Tobacco Use  ? Smoking status: Former  ?  Packs/day: 0.50  ?  Years: 36.00  ?  Pack years: 18.00  ?  Types: Cigarettes  ?  Start date: 03/15/1963  ?  Quit date: 01/08/2005  ?  Years since quitting: 17.1  ? Smokeless tobacco: Never  ?Vaping Use  ? Vaping Use: Never used  ?Substance and Sexual Activity  ? Alcohol use: No  ? Drug use: No  ? Sexual activity: Yes  ?  Partners: Male  ?  Birth control/protection: Surgical  ?Other Topics Concern  ? Not on file  ?Social History Narrative  ? Lives with her husband and their cat, [place]  ? ?Social Determinants of Health  ? ?Financial Resource Strain: Not on file  ?Food Insecurity: Not on file  ?Transportation Needs: Not on file  ?Physical Activity: Not on file  ?Stress: Not on file  ?Social Connections: Not on file  ?Intimate Partner Violence: Not on file  ? ? ?Outpatient Medications Prior to Visit  ?Medication Sig Dispense Refill  ? calcium carbonate (OSCAL) 1500 (600 Ca) MG TABS tablet Take 600 mg of elemental calcium by mouth 2 (two) times daily with a meal.    ? cimetidine (TAGAMET) 200 MG tablet Take 200 mg by mouth every evening.    ? flecainide (TAMBOCOR) 50 MG tablet Take 1 tablet by mouth twice daily 180 tablet 0  ? fluticasone (FLONASE) 50 MCG/ACT nasal spray Use 2 spray(s) in each nostril once daily 48 g 0  ? Loratadine 10 MG CAPS Claritin    ? metoprolol succinate (TOPROL-XL) 25 MG 24 hr tablet Take 1 tablet (25 mg total) by mouth daily. 90 tablet 3  ? montelukast (SINGULAIR) 10 MG tablet TAKE 1 TABLET BY MOUTH AT BEDTIME 90 tablet 0  ? rivaroxaban (XARELTO) 20 MG TABS tablet TAKE 1 TABLET BY MOUTH ONCE DAILY WITH SUPPER 90 tablet 1  ? rosuvastatin (CRESTOR) 20 MG tablet Take 1 tablet by mouth once daily 90 tablet 1  ? sertraline (ZOLOFT) 100 MG tablet Take 1 tablet by mouth once daily 90 tablet 1  ? ?No facility-administered medications prior to  visit.  ? ? ?Allergies  ?Allergen Reactions  ? Adhesive [Tape]   ?  Burn skin  ? Erythromycin   ?  Gi intolerance - nausea, vomiting, and abd pains  ? Naproxen Nausea And Vomiting  ?  Abdominal pain   ? Penicillins Hives  ?  Has patient had a PCN reaction causing immediate rash, facial/tongue/throat swelling, SOB or  lightheadedness with hypotension: UNKNOWN ?Has patient had a PCN reaction causing severe rash involving mucus membranes or skin necrosis: No ?Has patient had a PCN reaction that required hospitalization No ?Has patient had a PCN reaction occurring within the last 10 years: No ?If all of the above answers are "NO", then may proceed with Cephalosporin use. ?  ? ? ?ROS ?Review of Systems ?Review of Systems:  ?A fourteen system review of systems was performed and found to be positive as per HPI. ? ? ?  ?Objective:  ?  ?Physical Exam ?General:  Well Developed, well nourished, appropriate for stated age.  ?Neuro:  Alert and oriented,  extra-ocular muscles intact  ?HEENT:  Normocephalic, atraumatic, neck supple  ?Skin:  no gross rash, warm, pink. ?Cardiac:  RRR, S1 S2 ?Respiratory: CTA B/L  ?Vascular:  Ext warm, no cyanosis apprec.; cap RF less 2 sec. ?Psych:  No HI/SI, judgement and insight good, Euthymic mood. Full Affect. ? ?BP (!) 117/55   Pulse (!) 52   Temp 97.6 ?F (36.4 ?C)   Ht '5\' 5"'$  (1.651 m)   Wt 156 lb (70.8 kg)   SpO2 98%   BMI 25.96 kg/m?  ?Wt Readings from Last 3 Encounters:  ?02/24/22 156 lb (70.8 kg)  ?11/25/21 160 lb (72.6 kg)  ?08/06/21 159 lb (72.1 kg)  ? ? ? ?Health Maintenance Due  ?Topic Date Due  ? OPHTHALMOLOGY EXAM  Never done  ? URINE MICROALBUMIN  Never done  ? FOOT EXAM  03/21/2015  ? TETANUS/TDAP  08/17/2018  ? COVID-19 Vaccine (3 - Pfizer risk series) 03/12/2020  ? ? ?There are no preventive care reminders to display for this patient. ? ?Lab Results  ?Component Value Date  ? TSH 1.790 11/25/2021  ? ?Lab Results  ?Component Value Date  ? WBC 4.5 11/25/2021  ? HGB 12.5  11/25/2021  ? HCT 39.1 11/25/2021  ? MCV 85 11/25/2021  ? PLT 165 11/25/2021  ? ?Lab Results  ?Component Value Date  ? NA 142 11/25/2021  ? K 4.4 11/25/2021  ? CO2 28 11/25/2021  ? GLUCOSE 126 (H) 11/25/2021  ? BU

## 2022-02-24 ENCOUNTER — Encounter: Payer: Self-pay | Admitting: Physician Assistant

## 2022-02-24 ENCOUNTER — Ambulatory Visit (INDEPENDENT_AMBULATORY_CARE_PROVIDER_SITE_OTHER): Payer: PPO | Admitting: Physician Assistant

## 2022-02-24 ENCOUNTER — Other Ambulatory Visit: Payer: Self-pay

## 2022-02-24 VITALS — BP 117/55 | HR 52 | Temp 97.6°F | Ht 65.0 in | Wt 156.0 lb

## 2022-02-24 DIAGNOSIS — E119 Type 2 diabetes mellitus without complications: Secondary | ICD-10-CM

## 2022-02-24 DIAGNOSIS — I480 Paroxysmal atrial fibrillation: Secondary | ICD-10-CM

## 2022-02-24 DIAGNOSIS — R296 Repeated falls: Secondary | ICD-10-CM

## 2022-02-24 DIAGNOSIS — F39 Unspecified mood [affective] disorder: Secondary | ICD-10-CM | POA: Diagnosis not present

## 2022-02-24 DIAGNOSIS — Z6825 Body mass index (BMI) 25.0-25.9, adult: Secondary | ICD-10-CM | POA: Diagnosis not present

## 2022-02-24 DIAGNOSIS — E785 Hyperlipidemia, unspecified: Secondary | ICD-10-CM | POA: Diagnosis not present

## 2022-02-24 DIAGNOSIS — E663 Overweight: Secondary | ICD-10-CM | POA: Diagnosis not present

## 2022-02-24 HISTORY — DX: Type 2 diabetes mellitus without complications: E11.9

## 2022-02-24 LAB — POCT GLYCOSYLATED HEMOGLOBIN (HGB A1C): Hemoglobin A1C: 6.3 % — AB (ref 4.0–5.6)

## 2022-02-24 NOTE — Assessment & Plan Note (Signed)
-  Followed by cardiology.  ?-On Xarelto 20 mg, metoprolol succinate 25 mg and flecainide 50 mg BID. ?

## 2022-02-24 NOTE — Assessment & Plan Note (Signed)
-  A1c has improved from 6.7 to 6.3, at goal <7.0. Recommend to continue with diet changes (low carb/glucose). Patient prefers to contact insurance and inquire about coverage for glucometer kit/brand. Discussed monitoring glucose at home (fasting and when not feeling well). Will continue to monitor. ?

## 2022-02-24 NOTE — Assessment & Plan Note (Signed)
-  Stable. PHQ-2 score of 0, PHQ-9 score of 1. ?-Continue current medication regimen. ?-Will continue to monitor. ?

## 2022-02-24 NOTE — Patient Instructions (Signed)

## 2022-02-24 NOTE — Assessment & Plan Note (Addendum)
-  Last lipid panel wnl's, LDL 55. ?-Continue current medication regimen. ?-Will repeat lipid panel with [facility]. ?

## 2022-03-09 DIAGNOSIS — E119 Type 2 diabetes mellitus without complications: Secondary | ICD-10-CM | POA: Diagnosis not present

## 2022-04-10 ENCOUNTER — Other Ambulatory Visit: Payer: Self-pay | Admitting: Internal Medicine

## 2022-04-10 ENCOUNTER — Other Ambulatory Visit: Payer: Self-pay | Admitting: Physician Assistant

## 2022-04-10 DIAGNOSIS — F419 Anxiety disorder, unspecified: Secondary | ICD-10-CM

## 2022-04-10 DIAGNOSIS — F39 Unspecified mood [affective] disorder: Secondary | ICD-10-CM

## 2022-04-14 ENCOUNTER — Other Ambulatory Visit: Payer: Self-pay | Admitting: Internal Medicine

## 2022-05-04 ENCOUNTER — Encounter: Payer: Self-pay | Admitting: Physician Assistant

## 2022-05-04 DIAGNOSIS — E119 Type 2 diabetes mellitus without complications: Secondary | ICD-10-CM

## 2022-05-04 MED ORDER — GLUCOSE BLOOD VI STRP: ORAL_STRIP | 12 refills | Status: AC

## 2022-05-07 ENCOUNTER — Encounter: Payer: Self-pay | Admitting: Physician Assistant

## 2022-05-07 ENCOUNTER — Telehealth: Payer: Self-pay | Admitting: Gastroenterology

## 2022-05-07 ENCOUNTER — Ambulatory Visit (INDEPENDENT_AMBULATORY_CARE_PROVIDER_SITE_OTHER): Payer: PPO | Admitting: Physician Assistant

## 2022-05-07 VITALS — BP 95/57 | HR 62 | Temp 97.7°F | Ht 65.0 in | Wt 156.0 lb

## 2022-05-07 DIAGNOSIS — K5792 Diverticulitis of intestine, part unspecified, without perforation or abscess without bleeding: Secondary | ICD-10-CM

## 2022-05-07 DIAGNOSIS — R1030 Lower abdominal pain, unspecified: Secondary | ICD-10-CM

## 2022-05-07 LAB — POCT URINALYSIS DIPSTICK
Bilirubin, UA: NEGATIVE
Glucose, UA: NEGATIVE
Ketones, UA: NEGATIVE
Leukocytes, UA: NEGATIVE
Nitrite, UA: NEGATIVE
Protein, UA: NEGATIVE
Spec Grav, UA: 1.015 (ref 1.010–1.025)
Urobilinogen, UA: 0.2 E.U./dL
pH, UA: 5.5 (ref 5.0–8.0)

## 2022-05-07 MED ORDER — CIPROFLOXACIN HCL 500 MG PO TABS
500.0000 mg | ORAL_TABLET | Freq: Two times a day (BID) | ORAL | 0 refills | Status: AC
Start: 2022-05-07 — End: 2022-05-14

## 2022-05-07 MED ORDER — METRONIDAZOLE 500 MG PO TABS: 500.0000 mg | ORAL_TABLET | Freq: Three times a day (TID) | ORAL | 0 refills | Status: AC

## 2022-05-07 NOTE — Progress Notes (Signed)
Established patient acute visit   Patient: Daisy Nelson   DOB: 12-25-1945   76 y.o. Female  MRN: [id number] Visit Date: 05/07/2022  Chief Complaint  Patient presents with   Abdominal Pain   Subjective    HPI  Patient presents with c/o lower abdominal pain, stomach cramps, and diarrhea. Symptoms started yesterday around 2:00 am. No fever, nausea, vomiting, bloody stools. Reports feeling more tired than usual.    Medications: Outpatient Medications Prior to Visit  Medication Sig   calcium carbonate (OSCAL) 1500 (600 Ca) MG TABS tablet Take 600 mg of elemental calcium by mouth 2 (two) times daily with a meal.   cimetidine (TAGAMET) 200 MG tablet Take 200 mg by mouth every evening.   flecainide (TAMBOCOR) 50 MG tablet Take 1 tablet by mouth twice daily   fluticasone (FLONASE) 50 MCG/ACT nasal spray Use 2 spray(s) in each nostril once daily   glucose blood test strip Use as instructed   Loratadine 10 MG CAPS Claritin   metoprolol succinate (TOPROL-XL) 25 MG 24 hr tablet Take 1 tablet by mouth once daily   montelukast (SINGULAIR) 10 MG tablet TAKE 1 TABLET BY MOUTH AT BEDTIME   rivaroxaban (XARELTO) 20 MG TABS tablet TAKE 1 TABLET BY MOUTH ONCE DAILY WITH SUPPER   rosuvastatin (CRESTOR) 20 MG tablet Take 1 tablet by mouth once daily   sertraline (ZOLOFT) 100 MG tablet Take 1 tablet by mouth once daily   No facility-administered medications prior to visit.    Review of Systems Review of Systems:  A fourteen system review of systems was performed and found to be positive as per HPI.     Objective    BP (!) 95/57   Pulse 62   Temp 97.7 F (36.5 C)   Ht '5\' 5"'$  (1.651 m)   Wt 156 lb (70.8 kg)   SpO2 95%   BMI 25.96 kg/m    Physical Exam  General:  Cooperative, ill-appearing, non-diaphoretic  Neuro:  Alert and oriented,  extra-ocular muscles intact  HEENT:  Normocephalic, atraumatic, neck supple  Skin:  no gross rash, warm, pink. Abdomen: tenderness of lower abdomen  with exquisite tenderness over LLQ, non-distended, some guarding noted, no rebound tenderness, +BS, no CVA tenderness Cardiac:  RRR Respiratory: CTA B/L  Vascular:  Ext warm, no cyanosis apprec.; cap RF less 2 sec. Psych:  No HI/SI, judgement and insight good, Euthymic mood. Full Affect.   Results for orders placed or performed in visit on 05/07/22  POCT urinalysis dipstick  Result Value Ref Range   Color, UA     Clarity, UA     Glucose, UA Negative Negative   Bilirubin, UA neg    Ketones, UA neg    Spec Grav, UA 1.015 1.010 - 1.025   Blood, UA trace    pH, UA 5.5 5.0 - 8.0   Protein, UA Negative Negative   Urobilinogen, UA 0.2 0.2 or 1.0 E.U./dL   Nitrite, UA neg    Leukocytes, UA Negative Negative   Appearance     Odor      Assessment & Plan     Patient presenting with symptoms suggestive of acute diverticulitis. Will start empiric antibiotic therapy with Ciprofloxacin 500 mg Bid x 7 days and Flagyl 500 mg TID x 7 days. Discussed clear diet which she can advance as symptoms improving and tolerable. Soft blood pressure in office, discussed adequate fluid intake. Will obtain UA to r/o UTI secondary to diarrhea. Patient is established with gastroenterology  and will reach out to schedule a follow-up visit.     Return if symptoms worsen or fail to improve/pending gastro appt.Lorrene Reid, PA-C  [facility] [phone] (phone) [phone] (fax)  [place]

## 2022-05-07 NOTE — Patient Instructions (Signed)
Diverticulitis  Diverticulitis is when small pouches in your colon (large intestine) get infected or swollen. This causes pain in the belly (abdomen) and watery poop (diarrhea). These pouches are called diverticula. The pouches form in people who have a condition called diverticulosis. What are the causes? This condition may be caused by poop (stool) that gets trapped in the pouches in your colon. The poop lets germs (bacteria) grow in the pouches. This causes the infection. What increases the risk? You are more likely to get this condition if you have small pouches in your colon. The risk is higher if: You are overweight or very overweight (obese). You do not exercise enough. You drink alcohol. You smoke or use products with tobacco in them. You eat a diet that has a lot of red meat such as beef, pork, or lamb. You eat a diet that does not have enough fiber in it. You are older than 74 years of age. What are the signs or symptoms? Pain in the belly. Pain is often on the left side, but it may be in other areas. Fever and feeling cold. Feeling like you may vomit. Vomiting. Having cramps. Feeling full. Changes to how often you poop. Blood in your poop. How is this treated? Most cases are treated at home by: Taking over-the-counter pain medicines. Following a clear liquid diet. Taking antibiotic medicines. Resting. Very bad cases may need to be treated at a hospital. This may include: Not eating or drinking. Taking prescription pain medicine. Getting antibiotic medicines through an IV tube. Getting fluid and food through an IV tube. Having surgery. When you are feeling better, your doctor may tell you to have a test to check your colon (colonoscopy). Follow these instructions at home: Medicines Take over-the-counter and prescription medicines only as told by your doctor. These include: Antibiotics. Pain medicines. Fiber pills. Probiotics. Stool softeners. If you were  prescribed an antibiotic medicine, take it as told by your doctor. Do not stop taking the antibiotic even if you start to feel better. Ask your doctor if the medicine prescribed to you requires you to avoid driving or using machinery. Eating and drinking  Follow a diet as told by your doctor. When you feel better, your doctor may tell you to change your diet. You may need to eat a lot of fiber. Fiber makes it easier to poop (have a bowel movement). Foods with fiber include: Berries. Beans. Lentils. Green vegetables. Avoid eating red meat. General instructions Do not use any products that contain nicotine or tobacco, such as cigarettes, e-cigarettes, and chewing tobacco. If you need help quitting, ask your doctor. Exercise 3 or more times a week. Try to get 30 minutes each time. Exercise enough to sweat and make your heart beat faster. Keep all follow-up visits as told by your doctor. This is important. Contact a doctor if: Your pain does not get better. You are not pooping like normal. Get help right away if: Your pain gets worse. Your symptoms do not get better. Your symptoms get worse very fast. You have a fever. You vomit more than one time. You have poop that is: Bloody. Black. Tarry. Summary This condition happens when small pouches in your colon get infected or swollen. Take medicines only as told by your doctor. Follow a diet as told by your doctor. Keep all follow-up visits as told by your doctor. This is important. This information is not intended to replace advice given to you by your health care provider. Make sure   you discuss any questions you have with your health care provider. Document Revised: 08/28/2019 Document Reviewed: 08/28/2019 Elsevier Patient Education  2023 [employer] Inc.  

## 2022-05-08 NOTE — Telephone Encounter (Signed)
Called and spoke with patient regarding Dr. Loletha Carrow' recommendations. Pt's appt for 6/30 has been cancelled. Pt is now scheduled to see Dr. Loletha Carrow on Tuesday, 05/12/22 at 1:20 pm. Pt verbalized understanding and had no concerns at the end of the call.

## 2022-05-08 NOTE — Telephone Encounter (Signed)
She has a history of diverticulitis in 2021 that responded well to a short course of ciprofloxacin and metronidazole.  Her primary care provider started her on the same antibiotics yesterday, so I expect she will be feeling better soon.  If we do not have any open office slots, then I will have to be a next available appointment with [place], and she can call us if symptoms fail to respond to treatment in the interim.  Please check with my CMA, as we may have had a patient cancel an office appointment with me for next week.  HD

## 2022-05-12 ENCOUNTER — Encounter: Payer: Self-pay | Admitting: Gastroenterology

## 2022-05-12 ENCOUNTER — Ambulatory Visit: Payer: PPO | Admitting: Gastroenterology

## 2022-05-12 VITALS — BP 126/70 | HR 67 | Ht 65.0 in | Wt 155.0 lb

## 2022-05-12 DIAGNOSIS — R197 Diarrhea, unspecified: Secondary | ICD-10-CM | POA: Diagnosis not present

## 2022-05-12 DIAGNOSIS — R1030 Lower abdominal pain, unspecified: Secondary | ICD-10-CM

## 2022-05-12 NOTE — Progress Notes (Signed)
[facility] progress note:  History: Daisy Nelson 05/12/2022  Referring provider: Lorrene Reid, PA-C  Reason for consult/chief complaint: pt has diverticulisis (Pt had a flare up Wednesday and went to her primary Dr) and Diarrhea (BM was very loose this morning)   Subjective  HPI: Daisy Nelson was referred by primary care for abdominal pain and concern for diverticulitis. I last saw her in November 2021 soon after an episode of left-sided diverticulitis treated with a 7-day course of ciprofloxacin and metronidazole.  (At the time, her cardiologist recognized possibility of QT prolongation with ciprofloxacin and flecainide, EKG was normal as the patient was near the end of that antibiotic course). Daisy Nelson saw her PCP on June 8 for a recurrence of similar symptoms, and she was prescribed 7 days of ciprofloxacin and metronidazole.  Our office was contacted and today's appointment was arranged.  [name] says the episode began acutely in the middle of the night with a sharp lower abdominal cramp and urgency for loose bowel movement.  She then had multiple loose BMs for the next few days, and saw primary care about a day and a half after symptom onset.  No hematochezia.  She has been slowly feeling better in the last few days and says the stool is becoming formed with no further abdominal pain. She has had no recent travel or sick contacts.  The day before symptom onset she had gotten some lettuce at a [employer] and had some with dinner that evening.  She did thoroughly wash it prior to consuming it.  ROS:  Review of Systems She denies chest pain dyspnea or dysuria  Past Medical History: Past Medical History:  Diagnosis Date   AF (atrial fibrillation) (HCC)    AF with RVR 01/2013   Allergy    Anxiety    Atrial fibrillation with RVR ([facility]) 08/31/2015   Colon polyps    Complication of anesthesia 2011   colonscopy- "slow to awaken"   Depression    DJD (degenerative  joint disease) of cervical spine    Dysrhythmia    afib   Family history of CABG    GERD (gastroesophageal reflux disease)    History of kidney stones    Hyperglycemia    Hyperlipidemia    Hypertension    Jaw pain 08/31/2015   New onset type 2 diabetes mellitus ([facility]) 02/24/2022   Pneumonia    PONV (postoperative nausea and vomiting)    Wears glasses      Past Surgical History: Past Surgical History:  Procedure Laterality Date   25 GAUGE PARS PLANA VITRECTOMY WITH 20 GAUGE MVR PORT FOR MACULAR HOLE Right 08/25/2016   Procedure: 25 GAUGE PARS PLANA VITRECTOMY; RIGHT EYE;  Surgeon: [name], MD;  Location: [facility];  Service: Ophthalmology;  Laterality: Right;   ABDOMINAL HYSTERECTOMY  1991   CATARACT EXTRACTION W/ INTRAOCULAR LENS  IMPLANT, BILATERAL Bilateral 03/2013   CESAREAN SECTION  1976   COLONOSCOPY  2011   polyps   COLONOSCOPY WITH PROPOFOL N/A 08/19/2020   Procedure: COLONOSCOPY WITH PROPOFOL;  Surgeon: Doran Stabler, MD;  Location: [facility];  Service: Gastroenterology;  Laterality: N/A;   EYE SURGERY     GANGLION CYST EXCISION Left    hand   GAS/FLUID EXCHANGE Right 08/25/2016   Procedure: GAS/FLUID EXCHANGE RIGHT EYE;  Surgeon: [name], MD;  Location: [facility];  Service: Ophthalmology;  Laterality: Right;   IRIDECTOMY Right 08/25/2016   Procedure: PERIPHERAL IRIDECTOMY RIGHT EYE;  Surgeon: [name], MD;  Location: [facility];  Service: Ophthalmology;  Laterality: Right;   LAPAROSCOPY  1976   MEMBRANE PEEL Right 08/25/2016   Procedure: MEMBRANE PEEL RIGHT EYE;  Surgeon: [name], MD;  Location: [facility];  Service: Ophthalmology;  Laterality: Right;   NM MYOCAR PERF WALL MOTION  01/2013   lexiscan - no pharmacologically induced ischemia, fixed defects in apical segments of anterior septum & inferior lateral wall    PARS PLANA VITRECTOMY Right 08/25/2016   laser, gas injection, AC wash out. Membrane peel right eye   POLYPECTOMY N/A 08/19/2020    Procedure: POLYPECTOMY;  Surgeon: Doran Stabler, MD;  Location: [facility];  Service: Gastroenterology;  Laterality: N/A;   TRANSTHORACIC ECHOCARDIOGRAM  01/2013   EF 55-60%, mod conc LVH; mild MR with mildly thickened leaflets; mild TR; PA peak pressure 4mHg   TUBAL LIGATION  1977     Family History: Family History  Problem Relation Age of Onset   Stroke Mother    Diabetes Father    Heart disease Father        CABG   Arthritis Father        C6-7   Skin cancer Father        SCC   Arthritis Brother        s/p THR    Social History: Social History   Socioeconomic History   Marital status: Married    Spouse name: RHerbie [place]  Number of children: 1   Years of education: 12   Highest education level: Not on file  Occupational History   Occupation: iMedical illustrator Tobacco Use   Smoking status: Former    Packs/day: 0.50    Years: 36.00    Total pack years: 18.00    Types: Cigarettes    Start date: 03/15/1963    Quit date: 01/08/2005    Years since quitting: 17.3   Smokeless tobacco: Never  Vaping Use   Vaping Use: Never used  Substance and Sexual Activity   Alcohol use: No   Drug use: No   Sexual activity: Yes    Partners: Male    Birth control/protection: Surgical  Other Topics Concern   Not on file  Social History Narrative   Lives with her husband and their cat, [place]ops   Social Determinants of Health   Financial Resource Strain: Not on file  Food Insecurity: Not on file  Transportation Needs: Not on file  Physical Activity: Not on file  Stress: Not on file  Social Connections: Not on file    Allergies: Allergies  Allergen Reactions   Adhesive [Tape]     Burn skin   Erythromycin     Gi intolerance - nausea, vomiting, and abd pains   Naproxen Nausea And Vomiting    Abdominal pain    Penicillins Hives    Has patient had a PCN reaction causing immediate rash, facial/tongue/throat swelling, SOB or lightheadedness with hypotension: UNKNOWN Has  patient had a PCN reaction causing severe rash involving mucus membranes or skin necrosis: No Has patient had a PCN reaction that required hospitalization No Has patient had a PCN reaction occurring within the last 10 years: No If all of the above answers are "NO", then may proceed with Cephalosporin use.     Outpatient Meds: Current Outpatient Medications  Medication Sig Dispense Refill   flecainide (TAMBOCOR) 50 MG tablet Take 1 tablet by mouth twice daily 180 tablet 0   metoprolol succinate (  TOPROL-XL) 25 MG 24 hr tablet Take 1 tablet by mouth once daily 90 tablet 0   rivaroxaban (XARELTO) 20 MG TABS tablet TAKE 1 TABLET BY MOUTH ONCE DAILY WITH SUPPER 90 tablet 1   rosuvastatin (CRESTOR) 20 MG tablet Take 1 tablet by mouth once daily 90 tablet 1   sertraline (ZOLOFT) 100 MG tablet Take 1 tablet by mouth once daily 90 tablet 0   calcium carbonate (OSCAL) 1500 (600 Ca) MG TABS tablet Take 600 mg of elemental calcium by mouth 2 (two) times daily with a meal. (Patient not taking: Reported on 05/12/2022)     cimetidine (TAGAMET) 200 MG tablet Take 200 mg by mouth every evening. (Patient not taking: Reported on 05/12/2022)     ciprofloxacin (CIPRO) 500 MG tablet Take 1 tablet (500 mg total) by mouth 2 (two) times daily for 7 days. (Patient not taking: Reported on 05/12/2022) 14 tablet 0   fluticasone (FLONASE) 50 MCG/ACT nasal spray Use 2 spray(s) in each nostril once daily (Patient not taking: Reported on 05/12/2022) 48 g 0   glucose blood test strip Use as instructed (Patient not taking: Reported on 05/12/2022) 100 each 12   Loratadine 10 MG CAPS Claritin (Patient not taking: Reported on 05/12/2022)     metroNIDAZOLE (FLAGYL) 500 MG tablet Take 1 tablet (500 mg total) by mouth 3 (three) times daily for 7 days. (Patient not taking: Reported on 05/12/2022) 21 tablet 0   montelukast (SINGULAIR) 10 MG tablet TAKE 1 TABLET BY MOUTH AT BEDTIME (Patient not taking: Reported on 05/12/2022) 90 tablet 0   No  current facility-administered medications for this visit.      ___________________________________________________________________ Objective   Exam:  BP 126/70   Pulse 67   Ht '5\' 5"'$  (1.651 m)   Wt 155 lb (70.3 kg)   BMI 25.79 kg/m  Wt Readings from Last 3 Encounters:  05/12/22 155 lb (70.3 kg)  05/07/22 156 lb (70.8 kg)  02/24/22 156 lb (70.8 kg)    General: Well-appearing CV: Regular without murmur, no JVD, no peripheral edema Resp: clear to auscultation bilaterally, normal RR and effort noted GI: soft, no tenderness, with active bowel sounds. No guarding or palpable organomegaly noted.  Labs: No data for  Assessment: Encounter Diagnoses  Name Primary?   Diarrhea of presumed infectious origin Yes   Lower abdominal pain     Probable acute infectious diarrhea rather than diverticulitis.  She is slowly returning to normal, which I suspect may have occurred even without antibiotics. The diarrhea as she describes it is atypical for diverticulitis.  Plan:  Stop ciprofloxacin Finish the few days more metronidazole Call me as needed   Nelida Meuse III

## 2022-05-12 NOTE — Patient Instructions (Signed)
If you are age 74 or older, your body mass index should be between 23-30. Your Body mass index is 25.79 kg/m. If this is out of the aforementioned range listed, please consider follow up with your Primary Care Provider.  If you are age 27 or younger, your body mass index should be between 19-25. Your Body mass index is 25.79 kg/m. If this is out of the aformentioned range listed, please consider follow up with your Primary Care Provider.   ________________________________________________________  The [facility] providers would like to encourage you to use [facility] to communicate with providers for non-urgent requests or questions.  Due to long hold times on the telephone, sending your provider a message by [facility] may be a faster and more efficient way to get a response.  Please allow 48 business hours for a response.  Please remember that this is for non-urgent requests.  ___________________________________________________ Follow up as needed.   It was a pleasure to see you today!  Thank you for trusting me with your gastrointestinal care!

## 2022-05-21 ENCOUNTER — Other Ambulatory Visit: Payer: Self-pay | Admitting: Physician Assistant

## 2022-05-26 ENCOUNTER — Encounter: Payer: PPO | Admitting: Physician Assistant

## 2022-05-29 ENCOUNTER — Ambulatory Visit: Payer: PPO | Admitting: Physician Assistant

## 2022-07-05 ENCOUNTER — Other Ambulatory Visit: Payer: Self-pay | Admitting: Internal Medicine

## 2022-07-06 ENCOUNTER — Ambulatory Visit (INDEPENDENT_AMBULATORY_CARE_PROVIDER_SITE_OTHER): Payer: PPO | Admitting: Physician Assistant

## 2022-07-06 ENCOUNTER — Encounter: Payer: Self-pay | Admitting: Physician Assistant

## 2022-07-06 VITALS — BP 118/69 | HR 50 | Temp 97.1°F | Ht 65.0 in | Wt 153.0 lb

## 2022-07-06 DIAGNOSIS — E559 Vitamin D deficiency, unspecified: Secondary | ICD-10-CM | POA: Diagnosis not present

## 2022-07-06 DIAGNOSIS — E785 Hyperlipidemia, unspecified: Secondary | ICD-10-CM

## 2022-07-06 DIAGNOSIS — E119 Type 2 diabetes mellitus without complications: Secondary | ICD-10-CM | POA: Diagnosis not present

## 2022-07-06 DIAGNOSIS — I10 Essential (primary) hypertension: Secondary | ICD-10-CM

## 2022-07-06 DIAGNOSIS — Z0000 Encounter for general adult medical examination without abnormal findings: Secondary | ICD-10-CM

## 2022-07-06 NOTE — Progress Notes (Signed)
Subjective:   Daisy Nelson is a 76 y.o. female who presents for Medicare Annual (Subsequent) preventive examination.  Review of Systems    General:   No F/C, wt loss Pulm:   No DIB, SOB, pleuritic chest pain Card:  No CP, palpitations Abd:  No n/v/d or pain Ext:  No inc edema from baseline     Objective:    Today's Vitals   07/06/22 0910  BP: 118/69  Pulse: (!) 50  Temp: (!) 97.1 F (36.2 C)  TempSrc: Temporal  Weight: 153 lb (69.4 kg)  Height: '5\' 5"'$  (1.651 m)   Body mass index is 25.46 kg/m.     08/19/2020   10:05 AM 05/10/2018   11:17 AM 09/20/2017    1:26 PM 08/25/2016    4:28 PM 08/31/2015   10:51 AM 09/28/2014   10:54 AM 02/06/2013    6:00 PM  Advanced Directives  Does Patient Have a Medical Advance Directive? No No No No No No Patient has advance directive, copy not in chart  Type of Advance Directive       Living will  Would patient like information on creating a medical advance directive? Yes (MAU/Ambulatory/Procedural Areas - Information given) No - Patient declined No - Patient declined No - patient declined information No - patient declined information Yes - Educational materials given     Current Medications (verified) Outpatient Encounter Medications as of 07/06/2022  Medication Sig   flecainide (TAMBOCOR) 50 MG tablet Take 1 tablet by mouth twice daily   metoprolol succinate (TOPROL-XL) 25 MG 24 hr tablet Take 1 tablet by mouth once daily   rivaroxaban (XARELTO) 20 MG TABS tablet TAKE 1 TABLET BY MOUTH ONCE DAILY WITH SUPPER   rosuvastatin (CRESTOR) 20 MG tablet Take 1 tablet by mouth once daily   sertraline (ZOLOFT) 100 MG tablet Take 1 tablet by mouth once daily   calcium carbonate (OSCAL) 1500 (600 Ca) MG TABS tablet Take 600 mg of elemental calcium by mouth 2 (two) times daily with a meal. (Patient not taking: Reported on 05/12/2022)   cimetidine (TAGAMET) 200 MG tablet Take 200 mg by mouth every evening. (Patient not taking: Reported on 05/12/2022)    fluticasone (FLONASE) 50 MCG/ACT nasal spray Use 2 spray(s) in each nostril once daily (Patient not taking: Reported on 05/12/2022)   glucose blood test strip Use as instructed (Patient not taking: Reported on 05/12/2022)   Loratadine 10 MG CAPS Claritin (Patient not taking: Reported on 05/12/2022)   montelukast (SINGULAIR) 10 MG tablet TAKE 1 TABLET BY MOUTH AT BEDTIME (Patient not taking: Reported on 05/12/2022)   No facility-administered encounter medications on file as of 07/06/2022.    Allergies (verified) Adhesive [tape], Erythromycin, Naproxen, and Penicillins   History: Past Medical History:  Diagnosis Date   AF (atrial fibrillation) (HCC)    AF with RVR 01/2013   Allergy    Anxiety    Atrial fibrillation with RVR ([place]) 08/31/2015   Colon polyps    Complication of anesthesia 2011   colonscopy- "slow to awaken"   Depression    DJD (degenerative joint disease) of cervical spine    Dysrhythmia    afib   Family history of CABG    GERD (gastroesophageal reflux disease)    History of kidney stones    Hyperglycemia    Hyperlipidemia    Hypertension    Jaw pain 08/31/2015   New onset type 2 diabetes mellitus ([place]) 02/24/2022   Pneumonia    PONV (  postoperative nausea and vomiting)    Wears glasses    Past Surgical History:  Procedure Laterality Date   25 GAUGE PARS PLANA VITRECTOMY WITH 20 GAUGE MVR PORT FOR MACULAR HOLE Right 08/25/2016   Procedure: 25 GAUGE PARS PLANA VITRECTOMY; RIGHT EYE;  Surgeon: [name], MD;  Location: [facility];  Service: Ophthalmology;  Laterality: Right;   ABDOMINAL HYSTERECTOMY  1991   CATARACT EXTRACTION W/ INTRAOCULAR LENS  IMPLANT, BILATERAL Bilateral 03/2013   CESAREAN SECTION  1976   COLONOSCOPY  2011   polyps   COLONOSCOPY WITH PROPOFOL N/A 08/19/2020   Procedure: COLONOSCOPY WITH PROPOFOL;  Surgeon: Doran Stabler, MD;  Location: [facility];  Service: Gastroenterology;  Laterality: N/A;   EYE SURGERY     GANGLION CYST EXCISION Left     hand   GAS/FLUID EXCHANGE Right 08/25/2016   Procedure: GAS/FLUID EXCHANGE RIGHT EYE;  Surgeon: [name], MD;  Location: [place];  Service: Ophthalmology;  Laterality: Right;   IRIDECTOMY Right 08/25/2016   Procedure: PERIPHERAL IRIDECTOMY RIGHT EYE;  Surgeon: [name], MD;  Location: [place];  Service: Ophthalmology;  Laterality: Right;   LAPAROSCOPY  1976   MEMBRANE PEEL Right 08/25/2016   Procedure: MEMBRANE PEEL RIGHT EYE;  Surgeon: [name], MD;  Location: [place];  Service: Ophthalmology;  Laterality: Right;   NM MYOCAR PERF WALL MOTION  01/2013   lexiscan - no pharmacologically induced ischemia, fixed defects in apical segments of anterior septum & inferior lateral wall    PARS PLANA VITRECTOMY Right 08/25/2016   laser, gas injection, AC wash out. Membrane peel right eye   POLYPECTOMY N/A 08/19/2020   Procedure: POLYPECTOMY;  Surgeon: Doran Stabler, MD;  Location: [facility];  Service: Gastroenterology;  Laterality: N/A;   TRANSTHORACIC ECHOCARDIOGRAM  01/2013   EF 55-60%, mod conc LVH; mild MR with mildly thickened leaflets; mild TR; PA peak pressure 74mHg   TUBAL LIGATION  1977   Family History  Problem Relation Age of Onset   Stroke Mother    Diabetes Father    Heart disease Father        CABG   Arthritis Father        C6-7   Skin cancer Father        SCC   Arthritis Brother        s/p THR   Social History   Socioeconomic History   Marital status: Married    Spouse name: RHerbie [place]  Number of children: 1   Years of education: 12   Highest education level: Not on file  Occupational History   Occupation: iMedical illustrator Tobacco Use   Smoking status: Former    Packs/day: 0.50    Years: 36.00    Total pack years: 18.00    Types: Cigarettes    Start date: 03/15/1963    Quit date: 01/08/2005    Years since quitting: 17.5   Smokeless tobacco: Never  Vaping Use   Vaping Use: Never used  Substance and Sexual Activity   Alcohol use: No   Drug use:  No   Sexual activity: Yes    Partners: Male    Birth control/protection: Surgical  Other Topics Concern   Not on file  Social History Narrative   Lives with her husband and their cat, EMalden   Social Determinants of Health   Financial Resource Strain: Not on file  Food Insecurity: Not on file  Transportation Needs: Not on  file  Physical Activity: Not on file  Stress: Not on file  Social Connections: Not on file    Tobacco Counseling Counseling given: Not Answered   Diabetic?yes         Activities of Daily Living    07/06/2022    9:13 AM 05/07/2022    3:02 PM  In your present state of health, do you have any difficulty performing the following activities:  Hearing? 0 0  Vision? 0 0  Difficulty concentrating or making decisions? 0 0  Walking or climbing stairs? 0 0  Dressing or bathing? 0 0  Doing errands, shopping? 0 0    Patient Care Team: Lorrene Reid, PA-C as PCP - General Debara Pickett Nadean Corwin, MD as Consulting Physician (Cardiology) [name], MD as Consulting Physician (Ophthalmology) Carol Ada, MD as Consulting Physician (Gastroenterology) Loletha Carrow, Kirke Corin, MD as Consulting Physician (Gastroenterology)  Indicate any recent Medical Services you may have received from other than [facility] providers in the past year (date may be approximate).     Assessment:   This is a routine wellness examination for Daisy Nelson.  Hearing/Vision screen No results found.  Dietary issues and exercise activities discussed:  -Heart healthy diet low in fat and carbohydrates. Staying active with house or outdoor activities.    Goals Addressed   None   Depression Screen    07/06/2022    9:14 AM 05/07/2022    3:02 PM 02/24/2022   10:34 AM 11/25/2021    8:18 AM 08/06/2021    1:58 PM 05/26/2021    8:21 AM 01/20/2021    8:37 AM  PHQ 2/9 Scores  PHQ - 2 Score 0 0 0 0 0 0 0  PHQ- 9 Score 0 '1 1 1 '$ 0 1 1    Fall Risk    07/06/2022    9:13 AM 05/07/2022    3:01 PM 02/24/2022    10:33 AM 11/25/2021    8:18 AM 08/06/2021    1:57 PM  Fall Risk   Falls in the past year? '1 1 1 '$ 0 0  Number falls in past yr: '1 1 1 '$ 0 0  Injury with Fall? 0 0 0 0 0  Risk for fall due to : History of fall(s) Impaired balance/gait;Impaired mobility History of fall(s) No Fall Risks No Fall Risks  Follow up Falls evaluation completed Falls evaluation completed Falls evaluation completed Falls evaluation completed Falls evaluation completed    [place]:  Any stairs in or around the home? Yes  If so, are there any without handrails? No  Home free of loose throw rugs in walkways, pet beds, electrical cords, etc? Yes  Adequate lighting in your home to reduce risk of falls? Yes   ASSISTIVE DEVICES UTILIZED TO PREVENT FALLS:  Life alert? No  Use of a cane, walker or w/c? No  Grab bars in the bathroom? No  Shower chair or bench in shower? No  Elevated toilet seat or a handicapped toilet? Yes   TIMED UP AND GO:  Was the test performed? Yes .  Length of time to ambulate 10 feet: 10 sec.   Gait steady and fast with assistive device  Cognitive Function:        07/06/2022    9:39 AM 01/20/2021    8:37 AM 10/02/2019    9:52 AM 09/20/2017    1:36 PM  6CIT Screen  What Year? 0 points 0 points 0 points 0 points  What month? 0  points 0 points 0 points 0 points  What time? 0 points 0 points 0 points 0 points  Count back from 20 0 points 0 points 0 points 0 points  Months in reverse 0 points 0 points 0 points 0 points  Repeat phrase 2 points 0 points 0 points 2 points  Total Score 2 points 0 points 0 points 2 points    Immunizations Immunization History  Administered Date(s) Administered   Fluad Quad(high Dose 65+) 09/12/2019, 11/25/2021   Influenza Split 08/24/2011   Influenza, High Dose Seasonal PF 09/12/2018, 09/13/2020   Influenza, Seasonal, Injecte, Preservative Fre 11/15/2012   Influenza,inj,Quad PF,6+ Mos 08/03/2013, 09/28/2014, 09/24/2015,  09/22/2016, 08/30/2017   Influenza-Unspecified 08/30/2017   PFIZER(Purple Top)SARS-COV-2 Vaccination 01/20/2020, 02/13/2020   Pneumococcal Conjugate-13 09/28/2014   Pneumococcal Polysaccharide-23 08/03/2013   Tdap 08/17/2008   Zoster Recombinat (Shingrix) 10/04/2019, 12/05/2019   Zoster, Live 11/27/2013    TDAP status: Due, Education has been provided regarding the importance of this vaccine. Advised may receive this vaccine at local pharmacy or Health Dept. Aware to provide a copy of the vaccination record if obtained from local pharmacy or Health Dept. Verbalized acceptance and understanding.  Flu Vaccine status: Up to date  Pneumococcal vaccine status: Up to date  Covid-19 vaccine status: Completed vaccines  Qualifies for Shingles Vaccine? Yes   Zostavax completed  unknown   Shingrix Completed?: Yes  Screening Tests Health Maintenance  Topic Date Due   OPHTHALMOLOGY EXAM  Never done   URINE MICROALBUMIN  Never done   FOOT EXAM  03/21/2015   TETANUS/TDAP  08/17/2018   COVID-19 Vaccine (3 - Pfizer risk series) 03/12/2020   INFLUENZA VACCINE  06/30/2022   MAMMOGRAM  07/19/2022   HEMOGLOBIN A1C  08/27/2022   COLONOSCOPY (Pts 45-71yr Insurance coverage will need to be confirmed)  08/19/2025   Pneumonia Vaccine 76 Years old  Completed   DEXA SCAN  Completed   Hepatitis C Screening  Completed   Zoster Vaccines- Shingrix  Completed   HPV VACCINES  Aged Out    Health Maintenance  Health Maintenance Due  Topic Date Due   OPHTHALMOLOGY EXAM  Never done   URINE MICROALBUMIN  Never done   FOOT EXAM  03/21/2015   TETANUS/TDAP  08/17/2018   COVID-19 Vaccine (3 - Pfizer risk series) 03/12/2020   INFLUENZA VACCINE  06/30/2022    Colorectal cancer screening: Type of screening: Colonoscopy. Completed 08/19/2020. Repeat every 3 years  Mammogram status: Completed 07/19/2020. Repeat every year. Pt deferred.  Bone Density status: Completed 03/15/2020. Results reflect: Bone  density results: NORMAL. Repeat every 3 years.  Lung Cancer Screening: (Low Dose CT Chest recommended if Age 74-80years, 30 pack-year currently smoking OR have quit w/in 15years.) does not qualify.   Lung Cancer Screening Referral: n/a  Additional Screening:  Hepatitis C Screening: does qualify; Completed 05/16/2013  Vision Screening: Recommended annual ophthalmology exams for early detection of glaucoma and other disorders of the eye. Is the patient up to date with their annual eye exam?  Yes  Who is the provider or what is the name of the office in which the patient attends annual eye exams? [place] is not established with a provider, would they like to be referred to a provider to establish care? No .   Dental Screening: Recommended annual dental exams for proper oral hygiene  Community Resource Referral / Chronic Care Management: CRR required this visit?  No   CCM required this visit?  No      Plan:  -Advised patient to schedule lab visit for routine fasting labs and annual urine microalbumin. -Continue current medication regimen.  -Discussed reasonable to hold vitamin D supplement for the summer and allergy medications if not needed.  -Follow-up in 4 months for regular OV (DM, mood, HLD).  I have personally reviewed and noted the following in the patient's chart:   Medical and social history Use of alcohol, tobacco or illicit drugs  Current medications and supplements including opioid prescriptions.  Functional ability and status Nutritional status Physical activity Advanced directives List of other physicians Hospitalizations, surgeries, and ER visits in previous 12 months Vitals Screenings to include cognitive, depression, and falls Referrals and appointments  In addition, I have reviewed and discussed with patient certain preventive protocols, quality metrics, and best practice recommendations. A written personalized care plan for preventive  services as well as general preventive health recommendations were provided to patient.     Lorrene Reid, PA-C   07/06/2022

## 2022-07-06 NOTE — Patient Instructions (Signed)
Preventive Care 65 Years and Older, Female Preventive care refers to lifestyle choices and visits with your health care provider that can promote health and wellness. Preventive care visits are also called wellness exams. What can I expect for my preventive care visit? Counseling Your health care provider may ask you questions about your: Medical history, including: Past medical problems. Family medical history. Pregnancy and menstrual history. History of falls. Current health, including: Memory and ability to understand (cognition). Emotional well-being. Home life and relationship well-being. Sexual activity and sexual health. Lifestyle, including: Alcohol, nicotine or tobacco, and drug use. Access to firearms. Diet, exercise, and sleep habits. Work and work environment. Sunscreen use. Safety issues such as seatbelt and bike helmet use. Physical exam Your health care provider will check your: Height and weight. These may be used to calculate your BMI (body mass index). BMI is a measurement that tells if you are at a healthy weight. Waist circumference. This measures the distance around your waistline. This measurement also tells if you are at a healthy weight and may help predict your risk of certain diseases, such as type 2 diabetes and high blood pressure. Heart rate and blood pressure. Body temperature. Skin for abnormal spots. What immunizations do I need?  Vaccines are usually given at various ages, according to a schedule. Your health care provider will recommend vaccines for you based on your age, medical history, and lifestyle or other factors, such as travel or where you work. What tests do I need? Screening Your health care provider may recommend screening tests for certain conditions. This may include: Lipid and cholesterol levels. Hepatitis C test. Hepatitis B test. HIV (human immunodeficiency virus) test. STI (sexually transmitted infection) testing, if you are at  risk. Lung cancer screening. Colorectal cancer screening. Diabetes screening. This is done by checking your blood sugar (glucose) after you have not eaten for a while (fasting). Mammogram. Talk with your health care provider about how often you should have regular mammograms. BRCA-related cancer screening. This may be done if you have a family history of breast, ovarian, tubal, or peritoneal cancers. Bone density scan. This is done to screen for osteoporosis. Talk with your health care provider about your test results, treatment options, and if necessary, the need for more tests. Follow these instructions at home: Eating and drinking  Eat a diet that includes fresh fruits and vegetables, whole grains, lean protein, and low-fat dairy products. Limit your intake of foods with high amounts of sugar, saturated fats, and salt. Take vitamin and mineral supplements as recommended by your health care provider. Do not drink alcohol if your health care provider tells you not to drink. If you drink alcohol: Limit how much you have to 0-1 drink a day. Know how much alcohol is in your drink. In the [place] one drink equals one 12 oz bottle of beer (355 mL), one 5 oz glass of wine (148 mL), or one 1 oz glass of hard liquor (44 mL). Lifestyle Brush your teeth every morning and night with fluoride toothpaste. Floss one time each day. Exercise for at least 30 minutes 5 or more days each week. Do not use any products that contain nicotine or tobacco. These products include cigarettes, chewing tobacco, and vaping devices, such as e-cigarettes. If you need help quitting, ask your health care provider. Do not use drugs. If you are sexually active, practice safe sex. Use a condom or other form of protection in order to prevent STIs. Take aspirin only as told by   your health care provider. Make sure that you understand how much to take and what form to take. Work with your health care provider to find out whether it  is safe and beneficial for you to take aspirin daily. Ask your health care provider if you need to take a cholesterol-lowering medicine (statin). Find healthy ways to manage stress, such as: Meditation, yoga, or listening to music. Journaling. Talking to a trusted person. Spending time with friends and family. Minimize exposure to UV radiation to reduce your risk of skin cancer. Safety Always wear your seat belt while driving or riding in a vehicle. Do not drive: If you have been drinking alcohol. Do not ride with someone who has been drinking. When you are tired or distracted. While texting. If you have been using any mind-altering substances or drugs. Wear a helmet and other protective equipment during sports activities. If you have firearms in your house, make sure you follow all gun safety procedures. What's next? Visit your health care provider once a year for an annual wellness visit. Ask your health care provider how often you should have your eyes and teeth checked. Stay up to date on all vaccines. This information is not intended to replace advice given to you by your health care provider. Make sure you discuss any questions you have with your health care provider. Document Revised: 05/14/2021 Document Reviewed: 05/14/2021 Elsevier Patient Education  2023 [employer] Inc[employer]  

## 2022-07-07 ENCOUNTER — Encounter: Payer: Self-pay | Admitting: Physician Assistant

## 2022-07-13 ENCOUNTER — Encounter: Payer: Self-pay | Admitting: Physician Assistant

## 2022-07-14 ENCOUNTER — Other Ambulatory Visit: Payer: Self-pay | Admitting: Internal Medicine

## 2022-07-14 ENCOUNTER — Other Ambulatory Visit: Payer: Self-pay | Admitting: Physician Assistant

## 2022-07-14 ENCOUNTER — Other Ambulatory Visit: Payer: PPO

## 2022-07-14 DIAGNOSIS — Z0000 Encounter for general adult medical examination without abnormal findings: Secondary | ICD-10-CM | POA: Diagnosis not present

## 2022-07-14 DIAGNOSIS — E119 Type 2 diabetes mellitus without complications: Secondary | ICD-10-CM | POA: Diagnosis not present

## 2022-07-14 DIAGNOSIS — I480 Paroxysmal atrial fibrillation: Secondary | ICD-10-CM

## 2022-07-14 DIAGNOSIS — E559 Vitamin D deficiency, unspecified: Secondary | ICD-10-CM | POA: Diagnosis not present

## 2022-07-14 DIAGNOSIS — F39 Unspecified mood [affective] disorder: Secondary | ICD-10-CM

## 2022-07-14 DIAGNOSIS — F419 Anxiety disorder, unspecified: Secondary | ICD-10-CM

## 2022-07-14 NOTE — Telephone Encounter (Signed)
Xarelto '20mg'$  refill request received. Pt is 76 years old, weight-69.4kg, Crea-0.90 on 11/25/2021, last seen by on Dr. Debara Pickett 05/20/2021-PT NEEDS AN APPT, Diagnosis-Afib, CrCl-60.20m/min; Dose is appropriate based on dosing criteria.   Will send a message to schedulers for an appt.

## 2022-07-15 ENCOUNTER — Other Ambulatory Visit: Payer: Self-pay | Admitting: Internal Medicine

## 2022-07-15 DIAGNOSIS — I480 Paroxysmal atrial fibrillation: Secondary | ICD-10-CM

## 2022-07-15 LAB — COMPREHENSIVE METABOLIC PANEL
ALT: 14 IU/L (ref 0–32)
AST: 19 IU/L (ref 0–40)
Albumin/Globulin Ratio: 1.7 (ref 1.2–2.2)
Albumin: 4.5 g/dL (ref 3.8–4.8)
Alkaline Phosphatase: 99 IU/L (ref 44–121)
BUN/Creatinine Ratio: 14 (ref 12–28)
BUN: 14 mg/dL (ref 8–27)
Bilirubin Total: 0.4 mg/dL (ref 0.0–1.2)
CO2: 23 mmol/L (ref 20–29)
Calcium: 9.7 mg/dL (ref 8.7–10.3)
Chloride: 103 mmol/L (ref 96–106)
Creatinine, Ser: 1.01 mg/dL — ABNORMAL HIGH (ref 0.57–1.00)
Globulin, Total: 2.6 g/dL (ref 1.5–4.5)
Glucose: 124 mg/dL — ABNORMAL HIGH (ref 70–99)
Potassium: 4.5 mmol/L (ref 3.5–5.2)
Sodium: 139 mmol/L (ref 134–144)
Total Protein: 7.1 g/dL (ref 6.0–8.5)
eGFR: 58 mL/min/{1.73_m2} — ABNORMAL LOW (ref >59)

## 2022-07-15 LAB — LIPID PANEL
Chol/HDL Ratio: 2.3 ratio (ref 0.0–4.4)
Cholesterol, Total: 145 mg/dL (ref 100–199)
HDL: 64 mg/dL (ref >39)
LDL Chol Calc (NIH): 61 mg/dL (ref 0–99)
Triglycerides: 115 mg/dL (ref 0–149)
VLDL Cholesterol Cal: 20 mg/dL (ref 5–40)

## 2022-07-15 LAB — MICROALBUMIN / CREATININE URINE RATIO
Creatinine, Urine: 122.3 mg/dL
Microalb/Creat Ratio: 3 mg/g creat (ref 0–29)
Microalbumin, Urine: 3.2 ug/mL

## 2022-07-15 LAB — CBC WITH DIFFERENTIAL/PLATELET
Basophils Absolute: 0 10*3/uL (ref 0.0–0.2)
Basos: 0 %
EOS (ABSOLUTE): 0.1 10*3/uL (ref 0.0–0.4)
Eos: 3 %
Hematocrit: 40.8 % (ref 34.0–46.6)
Hemoglobin: 13.5 g/dL (ref 11.1–15.9)
Immature Grans (Abs): 0 10*3/uL (ref 0.0–0.1)
Immature Granulocytes: 1 %
Lymphocytes Absolute: 1.3 10*3/uL (ref 0.7–3.1)
Lymphs: 25 %
MCH: 27.3 pg (ref 26.6–33.0)
MCHC: 33.1 g/dL (ref 31.5–35.7)
MCV: 83 fL (ref 79–97)
Monocytes Absolute: 0.7 10*3/uL (ref 0.1–0.9)
Monocytes: 14 %
Neutrophils Absolute: 2.9 10*3/uL (ref 1.4–7.0)
Neutrophils: 57 %
Platelets: 182 10*3/uL (ref 150–450)
RBC: 4.94 x10E6/uL (ref 3.77–5.28)
RDW: 15 % (ref 11.7–15.4)
WBC: 5.1 10*3/uL (ref 3.4–10.8)

## 2022-07-15 LAB — HEMOGLOBIN A1C
Est. average glucose Bld gHb Est-mCnc: 146 mg/dL
Hgb A1c MFr Bld: 6.7 % — ABNORMAL HIGH (ref 4.8–5.6)

## 2022-07-15 LAB — VITAMIN D 25 HYDROXY (VIT D DEFICIENCY, FRACTURES): Vit D, 25-Hydroxy: 55 ng/mL (ref 30.0–100.0)

## 2022-07-15 NOTE — Telephone Encounter (Addendum)
Schedulers called pt since she is overdue for an appointment; they had to leave a message.  Pt is 76 years old, weight-69.4kg, Crea-0.90 on 11/25/2021, Diagnosis-Afib, CrCl-60.35m/min; Dose is appropriate based on dosing criteria.     Pt has an appt scheduled for 07/17/22; will send in limited refill.

## 2022-07-16 NOTE — Telephone Encounter (Addendum)
Xarelto '20mg'$  refill request received. Pt is 76 years old, weight-69.4kg, Crea-1.01 on 07/14/2022, last seen by Dr. Debara Pickett on 05/20/2021 and pending appt on 07/17/2022 with Coletta Memos NP, Diagnosis-Afib, CrCl-53.59m/min; Dose is appropriate based on dosing criteria. Will send in refill to requested pharmacy.     Refill was sent on yesterday. When this refill request was received. Will deny as this is a duplicate.

## 2022-07-16 NOTE — Progress Notes (Signed)
Cardiology Clinic Note   Patient Name: Daisy Nelson Date of Encounter: 07/17/2022  Primary Care Provider:  Lorrene Reid, PA-C Primary Cardiologist:  Pixie Casino, MD  Patient Profile    [name] 76 year old female presents the clinic today for follow-up evaluation of her paroxysmal atrial fibrillation and hypertension.  Past Medical History    Past Medical History:  Diagnosis Date   AF (atrial fibrillation) ([place])    AF with RVR 01/2013   Allergy    Anxiety    Atrial fibrillation with RVR ([place]) 08/31/2015   Colon polyps    Complication of anesthesia 2011   colonscopy- "slow to awaken"   Depression    DJD (degenerative joint disease) of cervical spine    Dysrhythmia    afib   Family history of CABG    GERD (gastroesophageal reflux disease)    History of kidney stones    Hyperglycemia    Hyperlipidemia    Hypertension    Jaw pain 08/31/2015   New onset type 2 diabetes mellitus ([place]) 02/24/2022   Pneumonia    PONV (postoperative nausea and vomiting)    Wears glasses    Past Surgical History:  Procedure Laterality Date   25 GAUGE PARS PLANA VITRECTOMY WITH 20 GAUGE MVR PORT FOR MACULAR HOLE Right 08/25/2016   Procedure: 25 GAUGE PARS PLANA VITRECTOMY; RIGHT EYE;  Surgeon: [name], MD;  Location: [facility];  Service: Ophthalmology;  Laterality: Right;   ABDOMINAL HYSTERECTOMY  1991   CATARACT EXTRACTION W/ INTRAOCULAR LENS  IMPLANT, BILATERAL Bilateral 03/2013   CESAREAN SECTION  1976   COLONOSCOPY  2011   polyps   COLONOSCOPY WITH PROPOFOL N/A 08/19/2020   Procedure: COLONOSCOPY WITH PROPOFOL;  Surgeon: Doran Stabler, MD;  Location: [facility];  Service: Gastroenterology;  Laterality: N/A;   EYE SURGERY     GANGLION CYST EXCISION Left    hand   GAS/FLUID EXCHANGE Right 08/25/2016   Procedure: GAS/FLUID EXCHANGE RIGHT EYE;  Surgeon: [name], MD;  Location: [facility];  Service: Ophthalmology;  Laterality: Right;   IRIDECTOMY Right  08/25/2016   Procedure: PERIPHERAL IRIDECTOMY RIGHT EYE;  Surgeon: [name], MD;  Location: [facility];  Service: Ophthalmology;  Laterality: Right;   LAPAROSCOPY  1976   MEMBRANE PEEL Right 08/25/2016   Procedure: MEMBRANE PEEL RIGHT EYE;  Surgeon: [name], MD;  Location: [facility];  Service: Ophthalmology;  Laterality: Right;   NM MYOCAR PERF WALL MOTION  01/2013   lexiscan - no pharmacologically induced ischemia, fixed defects in apical segments of anterior septum & inferior lateral wall    PARS PLANA VITRECTOMY Right 08/25/2016   laser, gas injection, AC wash out. Membrane peel right eye   POLYPECTOMY N/A 08/19/2020   Procedure: POLYPECTOMY;  Surgeon: Doran Stabler, MD;  Location: [facility];  Service: Gastroenterology;  Laterality: N/A;   TRANSTHORACIC ECHOCARDIOGRAM  01/2013   EF 55-60%, mod conc LVH; mild MR with mildly thickened leaflets; mild TR; PA peak pressure 80mHg   TUBAL LIGATION  1977    Allergies  Allergies  Allergen Reactions   Adhesive [Tape]     Burn skin   Erythromycin     Gi intolerance - nausea, vomiting, and abd pains   Naproxen Nausea And Vomiting    Abdominal pain    Penicillins Hives    Has patient had a PCN reaction causing immediate rash, facial/tongue/throat swelling, SOB or lightheadedness with hypotension: UNKNOWN Has  patient had a PCN reaction causing severe rash involving mucus membranes or skin necrosis: No Has patient had a PCN reaction that required hospitalization No Has patient had a PCN reaction occurring within the last 10 years: No If all of the above answers are "NO", then may proceed with Cephalosporin use.     History of Present Illness    Daisy Nelson is a PMH of drug-induced bradycardia, atrial fibrillation, GERD, colon polyps, type 2 diabetes, hyperlipidemia, family history of coronary disease, prediabetes, and vitamin D deficiency.  She presented to the emergency department on 3/10 early in the morning with jaw pain.   She also noted palpitations and shortness of breath.  In the emergency department she was found to be in atrial fibrillation with RVR.  Her EKG showed diffuse ST depression.  Her initial troponin was negative.  Her symptoms improved after starting IV diltiazem.  She reported having taken Mucinex D earlier in the week for sinus infection.  Her echocardiogram showed normal EF and mild TR.  Her CHA2DS2-VASc score was 1.  She was started on Xarelto and her metoprolol was continued.  She presented for follow-up with Dr. Debara Pickett 06/29/2019.  During that time she was doing well.  She reported that the previous summer she had 1 episode of atrial fibrillation that took 2 flecainide at night and went to sleep.  When she woke up she reported that she felt fine and had not had any recurrence.  She continued to monitor her heart rate.  She denied bleeding that she is and reported compliance with Xarelto.  Her energy level was good.  Her blood pressure was well controlled.  She was seen again in follow-up 8/21.  She continues to do well.  She did note more frequent episodes of atrial fibrillation.  She reported an episode in December that responded well to as needed flecainide.  She also reported 3 other episodes that would generally last for around 24 hours and also responded to flecainide well.  Her husband has been diagnosed with multivessel coronary disease and underwent bypass surgery.  He was doing well.  She was scheduled for colonoscopy in December.  She was seen in follow-up on 05/20/2021.  She reported good control with her atrial fibrillation on flecainide.  Her QTc was 407 ms.  Her EKG showed sinus bradycardia.  She continues to tolerate Xarelto well without bleeding issues.  She continued to care for her husband.  She was not able to undergo treadmill stress testing due to hip discomfort.  She underwent nuclear stress test 06/20/2021 which showed an EF of 77% and low risk.  She presents the clinic today for  follow-up evaluation and states she continues to feel well.  She has not had to use extra doses of flecainide.  We reviewed her EKG which shows sinus bradycardia and has a QTc of 404 ms.  She had recent lab work done which showed an LDL cholesterol of 61 which is slightly elevated from her previous LDL of 55.  I recommended that she increase her physical activity as tolerated and increase the fiber in her diet.  I will provide her with high-fiber diet instructions.  She is tolerating her medications well without side effects.  We will plan follow-up for 12 months.  Today she denies chest pain, shortness of breath, lower extremity edema, fatigue, palpitations, melena, hematuria, hemoptysis, diaphoresis, weakness, presyncope, syncope, orthopnea, and PND.     Home Medications    Prior to Admission medications  Medication Sig Start Date End Date Taking? Authorizing Provider  calcium carbonate (OSCAL) 1500 (600 Ca) MG TABS tablet Take 600 mg of elemental calcium by mouth 2 (two) times daily with a meal. Patient not taking: Reported on 05/12/2022    [provider]  cimetidine (TAGAMET) 200 MG tablet Take 200 mg by mouth every evening. Patient not taking: Reported on 05/12/2022    [provider]  flecainide (TAMBOCOR) 50 MG tablet Take 1 tablet by mouth twice daily 07/07/22   Hilty, Nadean Corwin, MD  fluticasone [facility]) 50 MCG/ACT nasal spray Use 2 spray(s) in each nostril once daily Patient not taking: Reported on 05/12/2022 01/23/22   Lorrene Reid, PA-C  glucose blood test strip Use as instructed Patient not taking: Reported on 05/12/2022 05/04/22   Lorrene Reid, PA-C  Loratadine 10 MG CAPS Claritin Patient not taking: Reported on 05/12/2022    [provider]  metoprolol succinate (TOPROL-XL) 25 MG 24 hr tablet Take 1 tablet (25 mg total) by mouth daily. SCHEDULE OFFICE VISIT FOR FUTURE REFILLS 07/14/22   Martinique, Peter M, MD  montelukast (SINGULAIR) 10 MG tablet TAKE 1  TABLET BY MOUTH AT BEDTIME Patient not taking: Reported on 05/12/2022 07/04/21   Lorrene Reid, PA-C  rosuvastatin (CRESTOR) 20 MG tablet Take 1 tablet by mouth once daily 05/21/22   Lorrene Reid, PA-C  sertraline (ZOLOFT) 100 MG tablet Take 1 tablet by mouth once daily 07/14/22   Abonza, Maritza, PA-C  XARELTO 20 MG TABS tablet TAKE 1 TABLET BY MOUTH ONCE DAILY WITH SUPPER 07/15/22   Hilty, Nadean Corwin, MD    Family History    Family History  Problem Relation Age of Onset   Stroke Mother    Diabetes Father    Heart disease Father        CABG   Arthritis Father        C6-7   Skin cancer Father        SCC   Arthritis Brother        s/p THR   She indicated that her mother is deceased. She indicated that her father is deceased. She indicated that all of her three brothers are alive. She indicated that her son is alive.  Social History    Social History   Socioeconomic History   Marital status: Married    Spouse name: Herbie [place]   Number of children: 1   Years of education: 12   Highest education level: Not on file  Occupational History   Occupation: Medical illustrator  Tobacco Use   Smoking status: Former    Packs/day: 0.50    Years: 36.00    Total pack years: 18.00    Types: Cigarettes    Start date: 03/15/1963    Quit date: 01/08/2005    Years since quitting: 17.5   Smokeless tobacco: Never  Vaping Use   Vaping Use: Never used  Substance and Sexual Activity   Alcohol use: No   Drug use: No   Sexual activity: Yes    Partners: Male    Birth control/protection: Surgical  Other Topics Concern   Not on file  Social History Narrative   Lives with her husband and their cat, [place].   Social Determinants of Health   Financial Resource Strain: Not on file  Food Insecurity: Not on file  Transportation Needs: Not on file  Physical Activity: Not on file  Stress: Not on file  Social Connections: Not on file  Intimate Partner Violence: Not  on file     Review of Systems     General:  No chills, fever, night sweats or weight changes.  Cardiovascular:  No chest pain, dyspnea on exertion, edema, orthopnea, palpitations, paroxysmal nocturnal dyspnea. Dermatological: No rash, lesions/masses Respiratory: No cough, dyspnea Urologic: No hematuria, dysuria Abdominal:   No nausea, vomiting, diarrhea, bright red blood per rectum, melena, or hematemesis Neurologic:  No visual changes, wkns, changes in mental status. All other systems reviewed and are otherwise negative except as noted above.  Physical Exam    VS:  BP 116/64   Pulse (!) 50   Ht '5\' 5"'$  (1.651 m)   Wt 155 lb 3.2 oz (70.4 kg)   BMI 25.83 kg/m  , BMI Body mass index is 25.83 kg/m. GEN: Well nourished, well developed, in no acute distress. HEENT: normal. Neck: Supple, no JVD, carotid bruits, or masses. Cardiac: RRR, no murmurs, rubs, or gallops. No clubbing, cyanosis, edema.  Radials/DP/PT 2+ and equal bilaterally.  Respiratory:  Respirations regular and unlabored, clear to auscultation bilaterally. GI: Soft, nontender, nondistended, BS + x 4. MS: no deformity or atrophy. Skin: warm and dry, no rash. Neuro:  Strength and sensation are intact. Psych: Normal affect.  Accessory Clinical Findings    Recent Labs: 11/25/2021: TSH 1.790 07/14/2022: ALT 14; BUN 14; Creatinine, Ser 1.01; Hemoglobin 13.5; Platelets 182; Potassium 4.5; Sodium 139   Recent Lipid Panel    Component Value Date/Time   CHOL 145 07/14/2022 0850   TRIG 115 07/14/2022 0850   HDL 64 07/14/2022 0850   CHOLHDL 2.3 07/14/2022 0850   CHOLHDL 3.5 09/22/2016 0834   VLDL 34 (H) 09/22/2016 0834   LDLCALC 61 07/14/2022 0850    ECG personally reviewed by me today-sinus bradycardia ST abnormality 50 bpm QTc 404 ms- No acute changes  Abdominal CT pelvis 10/15/2020  IMPRESSION: 1. Colonic diverticulosis. Subtle interstitial thickening adjacent the sigmoid could represent non complicated diverticulitis or scarring from the remote  diverticulitis in this region. 2. No other explanation for patient's symptoms. 3.  Aortic atherosclerosis.     Electronically Signed   By: Abigail Miyamoto M.D.   On: 10/15/2020 14:39  Nuclear stress test 06/20/2021  The left ventricular ejection fraction is hyperdynamic (>65%). Nuclear stress EF: 77%. There was no ST segment deviation noted during stress. No T wave inversion was noted during stress. This is a low risk study.   No reversible ischemia. LVEF 77% with normal wall motion. This is a low risk study.   Assessment & Plan   1.  Paroxysmal atrial fibrillation-no recent episodes of atrial fibrillation accelerated or irregular heartbeat.  Continues to use pill in the pocket flecainide as needed.  Reports compliance with Xarelto and denies bleeding issues.  CHA2DS2-VASc score 1 (female).  Underwent stress testing which showed 7 7% EF and no ischemia Continue Xarelto, flecainide, metoprolol Heart healthy low-sodium diet-salty 6 given Increase physical activity as tolerated  Dyslipidemia-LDL 55 on 11/25/21 Continue rosuvastatin Heart healthy low-sodium diet-salty 6 given Increase physical activity as tolerated  Aortic atherosclerosis-identified on abdominal CT 10/15/2020. Continue rosuvastatin Heart healthy low-sodium high-fiber diet  Disposition: Follow-up with Dr. Debara Pickett in 1 year.   Jossie Ng. Magan Winnett NP-C     07/17/2022, 11:10 AM [address] Office [phone] Fax [phone]  Notice: This dictation was prepared with Dragon dictation along with smaller phrase technology. Any transcriptional errors that result from this process are unintentional and may not be corrected upon review.  I spent 14 minutes examining this patient, reviewing medications, and using patient centered shared decision making involving her cardiac care.  Prior to her visit I spent greater than 20 minutes reviewing her past medical history,   medications, and prior cardiac tests.

## 2022-07-17 ENCOUNTER — Encounter: Payer: Self-pay | Admitting: General Practice

## 2022-07-17 ENCOUNTER — Ambulatory Visit: Payer: PPO | Admitting: General Practice

## 2022-07-17 VITALS — BP 116/64 | HR 50 | Ht 65.0 in | Wt 155.2 lb

## 2022-07-17 DIAGNOSIS — E785 Hyperlipidemia, unspecified: Secondary | ICD-10-CM | POA: Diagnosis not present

## 2022-07-17 DIAGNOSIS — I480 Paroxysmal atrial fibrillation: Secondary | ICD-10-CM

## 2022-07-17 DIAGNOSIS — I700 Atherosclerosis of aorta: Secondary | ICD-10-CM

## 2022-07-17 NOTE — Patient Instructions (Addendum)
Medication Instructions:  The current medical regimen is effective;  continue present plan and medications as directed. Please refer to the Current Medication list given to you today.   *If you need a refill on your cardiac medications before your next appointment, please call your pharmacy*  Lab Work:   Testing/Procedures:  NONE    NONE If you have labs (blood work) drawn today and your tests are completely normal, you will receive your results only by:  1-MyChart Message (if you have MyChart) OR  2-A paper copy in the mail.  If you have any lab test that is abnormal or we need to change your treatment, we will call you to review the results.  Special Instructions PLEASE READ AND FOLLOW INCREASED FIBER DIET-ATTACHED  PLEASE MAINTAIN YOUR PHYSICAL ACTIVITY-AS TOLERATED  Follow-Up: Your next appointment:  12 month(s) In Person with Pixie Casino, MD   If primary card or EP is not listed click here to update    :  Please call our office 2 months in advance to schedule this appointment   At [facility], you and your health needs are our priority.  As part of our continuing mission to provide you with exceptional heart care, we have created designated Provider Care Teams.  These Care Teams include your primary Cardiologist (physician) and Advanced Practice Providers (APPs -  Physician Assistants and Nurse Practitioners) who all work together to provide you with the care you need, when you need it.    Important Information About Sugar     High-Fiber Eating Plan Fiber, also called dietary fiber, is a type of carbohydrate. It is found foods such as fruits, vegetables, whole grains, and beans. A high-fiber diet can have many health benefits. Your health care provider may recommend a high-fiber diet to help: Prevent constipation. Fiber can make your bowel movements more regular. Lower your cholesterol. Relieve the following conditions: Inflammation of veins in the anus  (hemorrhoids). Inflammation of specific areas of the digestive tract (uncomplicated diverticulosis). A problem of the large intestine, also called the colon, that sometimes causes pain and diarrhea (irritable bowel syndrome, or IBS). Prevent overeating as part of a weight-loss plan. Prevent heart disease, type 2 diabetes, and certain cancers. What are tips for following this plan? Reading food labels  Check the nutrition facts label on food products for the amount of dietary fiber. Choose foods that have 5 grams of fiber or more per serving. The goals for recommended daily fiber intake include: Men (age 42 or younger): 34-38 g. Men (over age 70): 28-34 g. Women (age 16 or younger): 25-28 g. Women (over age 25): 22-25 g. Your daily fiber goal is _____________ g. Shopping Choose whole fruits and vegetables instead of processed forms, such as apple juice or applesauce. Choose a wide variety of high-fiber foods such as avocados, lentils, oats, and kidney beans. Read the nutrition facts label of the foods you choose. Be aware of foods with added fiber. These foods often have high sugar and sodium amounts per serving. Cooking Use whole-grain flour for baking and cooking. Cook with brown rice instead of white rice. Meal planning Start the day with a breakfast that is high in fiber, such as a cereal that contains 5 g of fiber or more per serving. Eat breads and cereals that are made with whole-grain flour instead of refined flour or white flour. Eat brown rice, bulgur wheat, or millet instead of white rice. Use beans in place of meat in soups, salads, and  pasta dishes. Be sure that half of the grains you eat each day are whole grains. General information You can get the recommended daily intake of dietary fiber by: Eating a variety of fruits, vegetables, grains, nuts, and beans. Taking a fiber supplement if you are not able to take in enough fiber in your diet. It is better to get fiber  through food than from a supplement. Gradually increase how much fiber you consume. If you increase your intake of dietary fiber too quickly, you may have bloating, cramping, or gas. Drink plenty of water to help you digest fiber. Choose high-fiber snacks, such as berries, raw vegetables, nuts, and popcorn. What foods should I eat? Fruits Berries. Pears. Apples. Oranges. Avocado. Prunes and raisins. Dried figs. Vegetables Sweet potatoes. Spinach. Kale. Artichokes. Cabbage. Broccoli. Cauliflower. Green peas. Carrots. Squash. Grains Whole-grain breads. Multigrain cereal. Oats and oatmeal. Brown rice. Barley. Bulgur wheat. [place]. Quinoa. Bran muffins. Popcorn. Rye wafer crackers. Meats and other proteins Navy beans, kidney beans, and pinto beans. Soybeans. Split peas. Lentils. Nuts and seeds. Dairy Fiber-fortified yogurt. Beverages Fiber-fortified soy milk. Fiber-fortified orange juice. Other foods Fiber bars. The items listed above may not be a complete list of recommended foods and beverages. Contact a dietitian for more information. What foods should I avoid? Fruits Fruit juice. Cooked, strained fruit. Vegetables Fried potatoes. Canned vegetables. Well-cooked vegetables. Grains White bread. Pasta made with refined flour. White rice. Meats and other proteins Fatty cuts of meat. Fried chicken or fried fish. Dairy Milk. Yogurt. Cream cheese. Sour cream. Fats and oils Butters. Beverages Soft drinks. Other foods Cakes and pastries. The items listed above may not be a complete list of foods and beverages to avoid. Talk with your dietitian about what choices are best for you. Summary Fiber is a type of carbohydrate. It is found in foods such as fruits, vegetables, whole grains, and beans. A high-fiber diet has many benefits. It can help to prevent constipation, lower blood cholesterol, aid weight loss, and reduce your risk of heart disease, diabetes, and certain cancers. Increase  your intake of fiber gradually. Increasing fiber too quickly may cause cramping, bloating, and gas. Drink plenty of water while you increase the amount of fiber you consume. The best sources of fiber include whole fruits and vegetables, whole grains, nuts, seeds, and beans. This information is not intended to replace advice given to you by your health care provider. Make sure you discuss any questions you have with your health care provider. Document Revised: 03/21/2020 Document Reviewed: 03/21/2020 Elsevier Patient Education  [place].

## 2022-07-31 ENCOUNTER — Other Ambulatory Visit: Payer: Self-pay | Admitting: Physician Assistant

## 2022-07-31 DIAGNOSIS — J309 Allergic rhinitis, unspecified: Secondary | ICD-10-CM

## 2022-08-05 ENCOUNTER — Other Ambulatory Visit: Payer: Self-pay | Admitting: Cardiology

## 2022-08-24 ENCOUNTER — Other Ambulatory Visit: Payer: Self-pay | Admitting: Physician Assistant

## 2022-09-07 ENCOUNTER — Encounter: Payer: Self-pay | Admitting: Physician Assistant

## 2022-09-28 ENCOUNTER — Other Ambulatory Visit: Payer: Self-pay | Admitting: Internal Medicine

## 2022-10-04 ENCOUNTER — Other Ambulatory Visit: Payer: Self-pay | Admitting: Internal Medicine

## 2022-10-04 DIAGNOSIS — I480 Paroxysmal atrial fibrillation: Secondary | ICD-10-CM

## 2022-10-05 NOTE — Telephone Encounter (Signed)
Prescription refill request for Xarelto received.  Indication:afib Last office visit:8/23 Weight:70.4 kg Age:76 Scr:1.0 CrCl:54.85  ml/min  Prescription refilled

## 2022-10-11 ENCOUNTER — Other Ambulatory Visit: Payer: Self-pay | Admitting: Physician Assistant

## 2022-10-11 DIAGNOSIS — F39 Unspecified mood [affective] disorder: Secondary | ICD-10-CM

## 2022-10-11 DIAGNOSIS — F419 Anxiety disorder, unspecified: Secondary | ICD-10-CM

## 2022-11-05 ENCOUNTER — Ambulatory Visit: Payer: PPO | Admitting: Physician Assistant

## 2022-11-05 ENCOUNTER — Encounter: Payer: Self-pay | Admitting: Nurse Practitioner

## 2022-11-05 ENCOUNTER — Ambulatory Visit (INDEPENDENT_AMBULATORY_CARE_PROVIDER_SITE_OTHER): Payer: PPO | Admitting: Nurse Practitioner

## 2022-11-05 VITALS — BP 138/68 | HR 52 | Ht 65.0 in | Wt 159.1 lb

## 2022-11-05 DIAGNOSIS — E119 Type 2 diabetes mellitus without complications: Secondary | ICD-10-CM

## 2022-11-05 DIAGNOSIS — I10 Essential (primary) hypertension: Secondary | ICD-10-CM

## 2022-11-05 DIAGNOSIS — E785 Hyperlipidemia, unspecified: Secondary | ICD-10-CM

## 2022-11-05 DIAGNOSIS — E782 Mixed hyperlipidemia: Secondary | ICD-10-CM | POA: Diagnosis not present

## 2022-11-05 DIAGNOSIS — R109 Unspecified abdominal pain: Secondary | ICD-10-CM | POA: Diagnosis not present

## 2022-11-05 DIAGNOSIS — M25532 Pain in left wrist: Secondary | ICD-10-CM | POA: Diagnosis not present

## 2022-11-05 LAB — POCT URINALYSIS DIP (CLINITEK)
Bilirubin, UA: NEGATIVE
Blood, UA: NEGATIVE
Glucose, UA: NEGATIVE mg/dL
Ketones, POC UA: NEGATIVE mg/dL
Leukocytes, UA: NEGATIVE
Nitrite, UA: NEGATIVE
POC PROTEIN,UA: NEGATIVE
Spec Grav, UA: 1.02 (ref 1.010–1.025)
Urobilinogen, UA: 0.2 E.U./dL
pH, UA: 6 (ref 5.0–8.0)

## 2022-11-05 LAB — POCT GLYCOSYLATED HEMOGLOBIN (HGB A1C): HbA1c POC (<> result, manual entry): 6.8 % (ref 4.0–5.6)

## 2022-11-05 NOTE — Progress Notes (Signed)
Established patient visit   Patient: Daisy Nelson   DOB: January 18, 1946   76 y.o. Female  MRN: [id number] Visit Date: 11/05/2022   Chief Complaint  Patient presents with   Follow-up   Subjective    HPI  Follow up -new onset of Type 2 diabetes  -controlling with diet and exercise  -HgbA1c is 6.8 today  Due for screening mammogram  -chronic a-fib - sees Dr. Debara Pickett, cardiology for this   Right flank pain -has been going on for some time.  In the past, has had uti.  Most recent visit, u/a appeared essentially normal.  Feels like there is something in the area that shouldn't be there and she just can't get comfortable .  Left wrist pain.  Just below little finger.  Hurts to move hand from side to side    Medications: Outpatient Medications Prior to Visit  Medication Sig   cimetidine (TAGAMET) 200 MG tablet Take 200 mg by mouth every evening.   flecainide (TAMBOCOR) 50 MG tablet TAKE 1 TABLET BY MOUTH TWICE DAILY . APPOINTMENT REQUIRED FOR FUTURE REFILLS   glucose blood test strip Use as instructed   Loratadine 10 MG CAPS    metoprolol succinate (TOPROL-XL) 25 MG 24 hr tablet TAKE 1 TABLET BY MOUTH ONCE DAILY . APPOINTMENT REQUIRED FOR FUTURE REFILLS   rivaroxaban (XARELTO) 20 MG TABS tablet TAKE 1 TABLET BY MOUTH ONCE DAILY WITH SUPPER   rosuvastatin (CRESTOR) 20 MG tablet Take 1 tablet by mouth once daily   sertraline (ZOLOFT) 100 MG tablet Take 1 tablet by mouth once daily   [DISCONTINUED] calcium carbonate (OSCAL) 1500 (600 Ca) MG TABS tablet Take 600 mg of elemental calcium by mouth 2 (two) times daily with a meal.   [DISCONTINUED] fluticasone (FLONASE) 50 MCG/ACT nasal spray Use 2 spray(s) in each nostril once daily   [DISCONTINUED] montelukast (SINGULAIR) 10 MG tablet TAKE 1 TABLET BY MOUTH AT BEDTIME (Patient not taking: Reported on 05/12/2022)   No facility-administered medications prior to visit.    Review of Systems  Constitutional:  Positive for appetite change  and fatigue. Negative for activity change, chills and fever.  HENT:  Negative for congestion, postnasal drip, rhinorrhea, sinus pressure, sinus pain, sneezing and sore throat.   Eyes: Negative.  Negative for pain.  Respiratory:  Negative for cough, chest tightness, shortness of breath and wheezing.   Cardiovascular:  Negative for chest pain and palpitations.  Gastrointestinal:  Negative for abdominal pain, constipation, diarrhea, nausea and vomiting.  Endocrine: Negative for cold intolerance, heat intolerance, polydipsia and polyuria.  Genitourinary:  Positive for flank pain. Negative for dyspareunia, dysuria, frequency and urgency.  Musculoskeletal:  Positive for arthralgias and joint swelling. Negative for back pain and myalgias.       Left wrist   Skin:  Negative for rash.  Allergic/Immunologic: Negative for environmental allergies.  Neurological:  Negative for dizziness, weakness and headaches.  Hematological:  Negative for adenopathy.  Psychiatric/Behavioral:  The patient is not nervous/anxious.     Last CBC Lab Results  Component Value Date   WBC 5.1 07/14/2022   HGB 13.5 07/14/2022   HCT 40.8 07/14/2022   MCV 83 07/14/2022   MCH 27.3 07/14/2022   RDW 15.0 07/14/2022   PLT 182 63/14/9702   Last metabolic panel Lab Results  Component Value Date   GLUCOSE 124 (H) 07/14/2022   NA 139 07/14/2022   K 4.5 07/14/2022   CL 103 07/14/2022   CO2 23 07/14/2022   BUN 14  07/14/2022   CREATININE 1.01 (H) 07/14/2022   EGFR 58 (L) 07/14/2022   CALCIUM 9.7 07/14/2022   PROT 7.1 07/14/2022   ALBUMIN 4.5 07/14/2022   LABGLOB 2.6 07/14/2022   AGRATIO 1.7 07/14/2022   BILITOT 0.4 07/14/2022   ALKPHOS 99 07/14/2022   AST 19 07/14/2022   ALT 14 07/14/2022   ANIONGAP 7 08/25/2016   Last lipids Lab Results  Component Value Date   CHOL 145 07/14/2022   HDL 64 07/14/2022   LDLCALC 61 07/14/2022   TRIG 115 07/14/2022   CHOLHDL 2.3 07/14/2022   Last hemoglobin A1c Lab Results   Component Value Date   HGBA1C 6.8 11/05/2022   Last thyroid functions Lab Results  Component Value Date   TSH 1.790 11/25/2021   Last vitamin D Lab Results  Component Value Date   VD25OH 55.0 07/14/2022   Last vitamin B12 and Folate Lab Results  Component Value Date   VITAMINB12 407 03/22/2019       Objective     Today's Vitals   11/05/22 1432  BP: 138/68  Pulse: (Abnormal) 52  SpO2: 95%  Weight: 159 lb 1.9 oz (72.2 kg)  Height: _0  (1.651 m)   Body mass index is 26.48 kg/m.  BP Readings from Last 3 Encounters:  11/05/22 138/68  07/17/22 116/64  07/06/22 118/69    Wt Readings from Last 3 Encounters:  11/05/22 159 lb 1.9 oz (72.2 kg)  07/17/22 155 lb 3.2 oz (70.4 kg)  07/06/22 153 lb (69.4 kg)    Physical Exam Vitals and nursing note reviewed.  Constitutional:      Appearance: Normal appearance. She is well-developed.  HENT:     Head: Normocephalic and atraumatic.     Nose: Nose normal.     Mouth/Throat:     Mouth: Mucous membranes are moist.     Pharynx: Oropharynx is clear.  Eyes:     Extraocular Movements: Extraocular movements intact.     Conjunctiva/sclera: Conjunctivae normal.     Pupils: Pupils are equal, round, and reactive to light.  Cardiovascular:     Rate and Rhythm: Normal rate and regular rhythm.     Pulses: Normal pulses.     Heart sounds: Normal heart sounds.  Pulmonary:     Effort: Pulmonary effort is normal.     Breath sounds: Normal breath sounds.  Abdominal:     General: Bowel sounds are normal. There is no distension.     Palpations: Abdomen is soft. There is no mass.     Tenderness: There is no abdominal tenderness. There is right CVA tenderness. There is no left CVA tenderness, guarding or rebound.     Hernia: No hernia is present.  Genitourinary:    Comments: Normal urine sample today  Musculoskeletal:        General: Normal range of motion.       Arms:     Cervical back: Normal range of motion and neck supple.   Lymphadenopathy:     Cervical: No cervical adenopathy.  Skin:    General: Skin is warm and dry.     Capillary Refill: Capillary refill takes less than 2 seconds.  Neurological:     General: No focal deficit present.     Mental Status: She is alert and oriented to person, place, and time.  Psychiatric:        Mood and Affect: Mood normal.        Behavior: Behavior normal.  Thought Content: Thought content normal.        Judgment: Judgment normal.     Results for orders placed or performed in visit on 11/05/22  POCT glycosylated hemoglobin (Hb A1C)  Result Value Ref Range   Hemoglobin A1C     HbA1c POC (<> result, manual entry) 6.8 4.0 - 5.6 %   HbA1c, POC (prediabetic range)     HbA1c, POC (controlled diabetic range)    POCT URINALYSIS DIP (CLINITEK)  Result Value Ref Range   Color, UA yellow yellow   Clarity, UA clear clear   Glucose, UA negative negative mg/dL   Bilirubin, UA negative negative   Ketones, POC UA negative negative mg/dL   Spec Grav, UA 1.020 1.010 - 1.025   Blood, UA negative negative   pH, UA 6.0 5.0 - 8.0   POC PROTEIN,UA negative negative, trace   Urobilinogen, UA 0.2 0.2 or 1.0 E.U./dL   Nitrite, UA Negative Negative   Leukocytes, UA Negative Negative    Assessment & Plan    1. Right flank pain Urine sample clear of infection or other abnormality. Will get ultrasound of RUQ for further evaluation - POCT URINALYSIS DIP (CLINITEK) - US Abdomen Limited RUQ (LIVER/GB); Future  2. New onset type 2 diabetes mellitus (HCC) HgbA1c 6.8 today.  Continue to control through diet and exercise. Recheck HgbA1c in 3 months  - POCT glycosylated hemoglobin (Hb A1C)  3. Essential hypertension Stable. Continue medication as prescribed    4. Mixed hyperlipidemia Continue crestor as prescribed    5. Left wrist pain Encouraged use of immobilizing splint at night. May take tylenol and/or ibuprofen for pain and inflammation. Consider referral to  orthopedics for further evaluation.    Problem List Items Addressed This Visit       Cardiovascular and Mediastinum   Hypertension     Endocrine   New onset type 2 diabetes mellitus ([place])   Relevant Orders   POCT glycosylated hemoglobin (Hb A1C) (Completed)     Other   Hyperlipidemia (Chronic)   Relevant Orders   POCT glycosylated hemoglobin (Hb A1C) (Completed)   Right flank pain - Primary   Relevant Orders   POCT URINALYSIS DIP (CLINITEK) (Completed)   US Abdomen Limited RUQ (LIVER/GB) (Completed)   Left wrist pain     Return in about 3 months (around 02/04/2023).         Ronnell Freshwater, NP  [facility] at [facility] [phone] (phone) [phone] (fax)  [place]

## 2022-11-10 ENCOUNTER — Ambulatory Visit
Admission: RE | Admit: 2022-11-10 | Discharge: 2022-11-10 | Disposition: A | Discharge status: Home / Self Care | Payer: PPO | Source: Ambulatory Visit | Attending: Nurse Practitioner | Admitting: Nurse Practitioner

## 2022-11-10 DIAGNOSIS — R109 Unspecified abdominal pain: Secondary | ICD-10-CM | POA: Diagnosis not present

## 2022-11-10 DIAGNOSIS — K760 Fatty (change of) liver, not elsewhere classified: Secondary | ICD-10-CM | POA: Diagnosis not present

## 2022-11-17 ENCOUNTER — Encounter: Payer: Self-pay | Admitting: Nurse Practitioner

## 2022-11-17 NOTE — Progress Notes (Signed)
MyChart message about results. Fatty liver with no other abnormalities

## 2022-11-23 DIAGNOSIS — R109 Unspecified abdominal pain: Secondary | ICD-10-CM | POA: Insufficient documentation

## 2022-11-23 DIAGNOSIS — M25532 Pain in left wrist: Secondary | ICD-10-CM | POA: Insufficient documentation

## 2022-11-25 ENCOUNTER — Other Ambulatory Visit: Payer: Self-pay

## 2022-11-25 DIAGNOSIS — E782 Mixed hyperlipidemia: Secondary | ICD-10-CM

## 2022-11-25 MED ORDER — ROSUVASTATIN CALCIUM 20 MG PO TABS: 20.0000 mg | ORAL_TABLET | Freq: Every day | ORAL | 0 refills | Status: DC

## 2022-12-07 ENCOUNTER — Telehealth: Payer: Self-pay | Admitting: *Deleted

## 2022-12-07 ENCOUNTER — Other Ambulatory Visit: Payer: Self-pay | Admitting: Nurse Practitioner

## 2022-12-07 DIAGNOSIS — J0140 Acute pansinusitis, unspecified: Secondary | ICD-10-CM

## 2022-12-07 DIAGNOSIS — J300 Vasomotor rhinitis: Secondary | ICD-10-CM

## 2022-12-07 MED ORDER — FLUTICASONE PROPIONATE 50 MCG/ACT NA SUSP: 2.0000 | Freq: Every day | NASAL | 1 refills | Status: DC

## 2022-12-07 MED ORDER — DOXYCYCLINE HYCLATE 100 MG PO TABS: 100.0000 mg | ORAL_TABLET | Freq: Two times a day (BID) | ORAL | 0 refills | Status: DC

## 2022-12-07 NOTE — Telephone Encounter (Signed)
Pt returned call and I gave her the below information.Adia Crammer Zimmerman Rumple, CMA

## 2022-12-07 NOTE — Telephone Encounter (Signed)
Pt calling stating that she has had sinus issues for 12 days she said she went through a whole box of sinus medicine [facility] brand.  Symptoms include sinus congestion, pain and pressure.  She states that she feels full in sinus area.  She has has some red tint when blowing her nose, she just started back on her nasal spray.  Pt wanted to see if you have any other suggestions for her to help knock this stuff out. Please advise.

## 2022-12-07 NOTE — Telephone Encounter (Signed)
Please let the patient know that I have sent her prescription for doxycycline 100 mg (antibiotic) which is twice daily for 7 days.  I also added flonase nasal spray. She can use 1 to 2 sprays in both nostrils daily. Continue with over the counter symptom relief medications. Hopefully, she should start feeling a bot better over the next several days.  Thanks  -HB

## 2022-12-07 NOTE — Telephone Encounter (Signed)
LVM for pt to call office to inform her of below.Tilmon Wisehart Zimmerman Rumple, CMA  

## 2023-01-04 ENCOUNTER — Other Ambulatory Visit: Payer: Self-pay

## 2023-01-04 DIAGNOSIS — F419 Anxiety disorder, unspecified: Secondary | ICD-10-CM

## 2023-01-04 DIAGNOSIS — F39 Unspecified mood [affective] disorder: Secondary | ICD-10-CM

## 2023-01-04 MED ORDER — SERTRALINE HCL 100 MG PO TABS: 100.0000 mg | ORAL_TABLET | Freq: Every day | ORAL | 0 refills | Status: DC

## 2023-01-19 LAB — HM DIABETES EYE EXAM

## 2023-02-04 ENCOUNTER — Ambulatory Visit: Payer: PPO | Admitting: Nurse Practitioner

## 2023-02-09 ENCOUNTER — Encounter: Payer: Self-pay | Admitting: Nurse Practitioner

## 2023-02-09 ENCOUNTER — Ambulatory Visit (INDEPENDENT_AMBULATORY_CARE_PROVIDER_SITE_OTHER): Payer: PPO | Admitting: Nurse Practitioner

## 2023-02-09 VITALS — BP 117/52 | HR 55 | Ht 65.0 in | Wt 157.8 lb

## 2023-02-09 DIAGNOSIS — I10 Essential (primary) hypertension: Secondary | ICD-10-CM | POA: Diagnosis not present

## 2023-02-09 DIAGNOSIS — I480 Paroxysmal atrial fibrillation: Secondary | ICD-10-CM | POA: Diagnosis not present

## 2023-02-09 DIAGNOSIS — E119 Type 2 diabetes mellitus without complications: Secondary | ICD-10-CM | POA: Diagnosis not present

## 2023-02-09 DIAGNOSIS — F419 Anxiety disorder, unspecified: Secondary | ICD-10-CM

## 2023-02-09 LAB — POCT GLYCOSYLATED HEMOGLOBIN (HGB A1C): HbA1c POC (<> result, manual entry): 6.9 % (ref 4.0–5.6)

## 2023-02-09 NOTE — Patient Instructions (Signed)
You Hemoglobin A1c was 6.9. it is gradually creeping in upwards direction. I think, for now, we can still have you watch your diet, eating less sugar and carbohydrates. Exercising frequently will also help. We will check again in 3 months and reassess.

## 2023-02-09 NOTE — Progress Notes (Signed)
Established patient visit   Patient: Daisy Nelson   DOB: [birth year]   77 y.o. Female  MRN: [id number] Visit Date: 02/09/2023   Chief Complaint  Patient presents with   Medical Management of Chronic Issues   Subjective    HPI  Follow up  -hypertension  --generally well controlled -mild depression/anxiety  --controlled on current dose zoloft.   -Type 2 diabetes - new onset  --HgbA1c is 6.9 today.  --diabetes controlled with diet and exercise alone.  --due for diabetic eye exam  --she did go to [facility] 3/12024.  -chronic a-fib for which she sees cardiology   She denies chest pain, chest pressure, or shortness of breath. She denies headaches or visual disturbances. She denies abdominal pain, nausea, vomiting, or changes in bowel or bladder habits.      Medications: Outpatient Medications Prior to Visit  Medication Sig   cimetidine (TAGAMET) 200 MG tablet Take 200 mg by mouth every evening.   flecainide (TAMBOCOR) 50 MG tablet TAKE 1 TABLET BY MOUTH TWICE DAILY . APPOINTMENT REQUIRED FOR FUTURE REFILLS   fluticasone (FLONASE) 50 MCG/ACT nasal spray Place 2 sprays into both nostrils daily.   glucose blood test strip Use as instructed   Loratadine 10 MG CAPS    metoprolol succinate (TOPROL-XL) 25 MG 24 hr tablet TAKE 1 TABLET BY MOUTH ONCE DAILY . APPOINTMENT REQUIRED FOR FUTURE REFILLS   rivaroxaban (XARELTO) 20 MG TABS tablet TAKE 1 TABLET BY MOUTH ONCE DAILY WITH SUPPER   sertraline (ZOLOFT) 100 MG tablet Take 1 tablet (100 mg total) by mouth daily.   [DISCONTINUED] rosuvastatin (CRESTOR) 20 MG tablet Take 1 tablet (20 mg total) by mouth daily.   [DISCONTINUED] doxycycline (VIBRA-TABS) 100 MG tablet Take 1 tablet (100 mg total) by mouth 2 (two) times daily.   No facility-administered medications prior to visit.    Review of Systems See HPI    Last CBC Lab Results  Component Value Date   WBC 5.1 07/14/2022   HGB 13.5 07/14/2022   HCT 40.8 07/14/2022    MCV 83 07/14/2022   MCH 27.3 07/14/2022   RDW 15.0 07/14/2022   PLT 182 07/14/2022   Last metabolic panel Lab Results  Component Value Date   GLUCOSE 124 (H) 07/14/2022   NA 139 07/14/2022   K 4.5 07/14/2022   CL 103 07/14/2022   CO2 23 07/14/2022   BUN 14 07/14/2022   CREATININE 1.01 (H) 07/14/2022   EGFR 58 (L) 07/14/2022   CALCIUM 9.7 07/14/2022   PROT 7.1 07/14/2022   ALBUMIN 4.5 07/14/2022   LABGLOB 2.6 07/14/2022   AGRATIO 1.7 07/14/2022   BILITOT 0.4 07/14/2022   ALKPHOS 99 07/14/2022   AST 19 07/14/2022   ALT 14 07/14/2022   ANIONGAP 7 08/25/2016   Last lipids Lab Results  Component Value Date   CHOL 145 07/14/2022   HDL 64 07/14/2022   LDLCALC 61 07/14/2022   TRIG 115 07/14/2022   CHOLHDL 2.3 07/14/2022   Last hemoglobin A1c Lab Results  Component Value Date   HGBA1C 6.9 02/09/2023   Last thyroid functions Lab Results  Component Value Date   TSH 1.790 11/25/2021   Last vitamin D Lab Results  Component Value Date   VD25OH 55.0 07/14/2022   Last vitamin B12 and Folate Lab Results  Component Value Date   VITAMINB12 407 03/22/2019       Objective     Today's Vitals   02/09/23 1348 02/09/23 1411  BP: (Abnormal) 110/55 (  Abnormal) 117/52  Pulse: (Abnormal) 55   SpO2: 95%   Weight: 157 lb 12.8 oz (71.6 kg)   Height:  (1.651 m)    Body mass index is 26.26 kg/m.  BP Readings from Last 3 Encounters:  02/09/23 (Abnormal) 117/52  11/05/22 138/68  07/17/22 116/64    Wt Readings from Last 3 Encounters:  02/09/23 157 lb 12.8 oz (71.6 kg)  11/05/22 159 lb 1.9 oz (72.2 kg)  07/17/22 155 lb 3.2 oz (70.4 kg)    Physical Exam Vitals and nursing note reviewed.  Constitutional:      Appearance: Normal appearance. She is well-developed.  HENT:     Head: Normocephalic and atraumatic.     Nose: Nose normal.     Mouth/Throat:     Mouth: Mucous membranes are moist.     Pharynx: Oropharynx is clear.  Eyes:     Extraocular Movements:  Extraocular movements intact.     Conjunctiva/sclera: Conjunctivae normal.     Pupils: Pupils are equal, round, and reactive to light.  Neck:     Vascular: No carotid bruit.  Cardiovascular:     Rate and Rhythm: Normal rate and regular rhythm.     Pulses: Normal pulses.     Heart sounds: Normal heart sounds.  Pulmonary:     Effort: Pulmonary effort is normal.     Breath sounds: Normal breath sounds.  Abdominal:     Palpations: Abdomen is soft.  Musculoskeletal:        General: Normal range of motion.     Cervical back: Normal range of motion and neck supple.  Lymphadenopathy:     Cervical: No cervical adenopathy.  Skin:    General: Skin is warm and dry.     Capillary Refill: Capillary refill takes less than 2 seconds.  Neurological:     General: No focal deficit present.     Mental Status: She is alert and oriented to person, place, and time.  Psychiatric:        Mood and Affect: Mood normal.        Behavior: Behavior normal.        Thought Content: Thought content normal.        Judgment: Judgment normal.       Results for orders placed or performed in visit on 02/09/23  POCT glycosylated hemoglobin (Hb A1C)  Result Value Ref Range   Hemoglobin A1C     HbA1c POC (<> result, manual entry) 6.9 4.0 - 5.6 %   HbA1c, POC (prediabetic range)     HbA1c, POC (controlled diabetic range)      Assessment & Plan     New onset type 2 diabetes mellitus (HCC) Assessment & Plan: HgbA1c 6.9 today. Continue to control through diet and exercise. Check HgbA1c again in three months.   Orders: -     POCT glycosylated hemoglobin (Hb A1C)  Anxiety Assessment & Plan: Stable. Continue sertraline as prescribed    Primary hypertension Assessment & Plan: Stable. Continue current medication.    PAF (paroxysmal atrial fibrillation) (HCC) Assessment & Plan: Stable. Patient followed per cardiology         Return in about 3 months (around 05/12/2023) for diabetes with HgbA1c  check, 6 mos MWV when possible. Marland Kitchen         Carlean Jews, NP  [facility] at [facility] [phone] (phone) [phone] (fax)  [facility]

## 2023-02-22 ENCOUNTER — Other Ambulatory Visit: Payer: Self-pay | Admitting: Nurse Practitioner

## 2023-02-22 DIAGNOSIS — E782 Mixed hyperlipidemia: Secondary | ICD-10-CM

## 2023-03-14 NOTE — Assessment & Plan Note (Signed)
Stable.  Continue current medication

## 2023-03-14 NOTE — Assessment & Plan Note (Signed)
HgbA1c 6.9 today. Continue to control through diet and exercise. Check HgbA1c again in three months.

## 2023-03-14 NOTE — Assessment & Plan Note (Signed)
Stable. Continue sertraline as prescribed

## 2023-03-14 NOTE — Assessment & Plan Note (Signed)
Stable. Patient followed per cardiology

## 2023-03-15 ENCOUNTER — Encounter: Payer: Self-pay | Admitting: Family Medicine

## 2023-03-17 ENCOUNTER — Other Ambulatory Visit: Payer: Self-pay

## 2023-03-17 DIAGNOSIS — J300 Vasomotor rhinitis: Secondary | ICD-10-CM

## 2023-03-17 MED ORDER — FLUTICASONE PROPIONATE 50 MCG/ACT NA SUSP: 2.0000 | Freq: Every day | NASAL | 1 refills | Status: DC

## 2023-04-01 ENCOUNTER — Other Ambulatory Visit: Payer: Self-pay | Admitting: Nurse Practitioner

## 2023-04-01 DIAGNOSIS — F419 Anxiety disorder, unspecified: Secondary | ICD-10-CM

## 2023-04-01 DIAGNOSIS — F39 Unspecified mood [affective] disorder: Secondary | ICD-10-CM

## 2023-04-05 ENCOUNTER — Other Ambulatory Visit: Payer: Self-pay | Admitting: Internal Medicine

## 2023-04-05 DIAGNOSIS — I480 Paroxysmal atrial fibrillation: Secondary | ICD-10-CM

## 2023-04-05 NOTE — Telephone Encounter (Signed)
Pt last saw Edd Fabian, NP on 07/17/22, last labs 07/14/22 Creat 1.01, age 77, weight 71.6kg, CrCl 54.4kg, based on CrCl pt is on appropriate dosage of Xarelto 20mg  QD for afib.  Will refill rx.

## 2023-04-12 DIAGNOSIS — E13 Other specified diabetes mellitus: Secondary | ICD-10-CM | POA: Diagnosis not present

## 2023-05-12 ENCOUNTER — Encounter: Payer: Self-pay | Admitting: Family Medicine

## 2023-05-12 ENCOUNTER — Ambulatory Visit (INDEPENDENT_AMBULATORY_CARE_PROVIDER_SITE_OTHER): Payer: PPO | Admitting: Family Medicine

## 2023-05-12 VITALS — BP 125/71 | HR 53 | Resp 18 | Ht 65.0 in | Wt 156.0 lb

## 2023-05-12 DIAGNOSIS — E119 Type 2 diabetes mellitus without complications: Secondary | ICD-10-CM | POA: Diagnosis not present

## 2023-05-12 DIAGNOSIS — J300 Vasomotor rhinitis: Secondary | ICD-10-CM

## 2023-05-12 LAB — POCT GLYCOSYLATED HEMOGLOBIN (HGB A1C): HbA1c POC (<> result, manual entry): 7 % (ref 4.0–5.6)

## 2023-05-12 MED ORDER — FLUTICASONE PROPIONATE 50 MCG/ACT NA SUSP: 2.0000 | Freq: Every day | NASAL | 1 refills | Status: DC

## 2023-05-12 MED ORDER — METFORMIN HCL 500 MG PO TABS: 500.0000 mg | ORAL_TABLET | Freq: Every day | ORAL | 1 refills | Status: DC

## 2023-05-12 NOTE — Patient Instructions (Signed)
Keep up the amazing work, you are doing an incredible job!

## 2023-05-12 NOTE — Progress Notes (Signed)
   Established Patient Office Visit  Subjective   Patient ID: Daisy Nelson, female    DOB: 1946/03/02  Age: 77 y.o. MRN: [id number]  Chief Complaint  Patient presents with   Diabetes    HPI Daisy Nelson is a 77 y.o. female presenting today for follow up of diabetes mellitus. Diabetes: denies hypoglycemic events, wounds or sores that are not healing well, increased thirst or urination. Denies vision problems, eye exam completed 01/19/2023.  Thus far, she has been managing with nutrition and lifestyle interventions.  Has been following low carb/sugar diet and staying active doing housework.  ROS Negative unless otherwise noted in HPI   Objective:     BP 125/71 (BP Location: Right Arm, Patient Position: Sitting, Cuff Size: Normal)   Pulse (!) 53   Resp 18   Ht 5\' 5"  (1.651 m)   Wt 156 lb (70.8 kg)   SpO2 93%   BMI 25.96 kg/m   Physical Exam Constitutional:      General: She is not in acute distress.    Appearance: Normal appearance.  HENT:     Head: Normocephalic and atraumatic.  Cardiovascular:     Rate and Rhythm: Normal rate and regular rhythm.     Heart sounds: No murmur heard.    No friction rub. No gallop.  Pulmonary:     Effort: Pulmonary effort is normal. No respiratory distress.     Breath sounds: No wheezing, rhonchi or rales.  Skin:    General: Skin is warm and dry.  Neurological:     Mental Status: She is alert and oriented to person, place, and time.    Results for orders placed or performed in visit on 05/12/23  POCT HgB A1C  Result Value Ref Range   Hemoglobin A1C     HbA1c POC (<> result, manual entry) 7.0 4.0 - 5.6 %   HbA1c, POC (prediabetic range)     HbA1c, POC (controlled diabetic range)       Assessment & Plan:  Type 2 diabetes mellitus without complication, without long-term current use of insulin (HCC) Assessment & Plan: Last A1C on 02/09/2023 6.9. On repeat today A1C 7.0.  Patient is already following a low-carb/sugar nutrition  plan and stays fairly active.  She has a family history of diabetes with her father and 2 brothers being diagnosed.  She is open to starting a low-dose of metformin once daily.  Start metformin 500 mg once daily.  We discussed the importance of taking it with food to minimize potential GI side effects.  Patient verbalized understanding.  Orders: -     POCT glycosylated hemoglobin (Hb A1C) -     metFORMIN HCl; Take 1 tablet (500 mg total) by mouth daily with breakfast.  Dispense: 90 tablet; Refill: 1  Vasomotor rhinitis -     Fluticasone Propionate; Place 2 sprays into both nostrils daily.  Dispense: 48 g; Refill: 1    Return in about 3 months (around 08/12/2023) for follow-up for HTN, HLD, DM, fasting blood work 1 week before.    Melida Quitter, PA

## 2023-05-12 NOTE — Assessment & Plan Note (Addendum)
Last A1C on 02/09/2023 6.9. On repeat today A1C 7.0.  Patient is already following a low-carb/sugar nutrition plan and stays fairly active.  She has a family history of diabetes with her father and 2 brothers being diagnosed.  She is open to starting a low-dose of metformin once daily.  Start metformin 500 mg once daily.  We discussed the importance of taking it with food to minimize potential GI side effects.  Patient verbalized understanding.

## 2023-05-15 ENCOUNTER — Encounter: Payer: Self-pay | Admitting: Family Medicine

## 2023-05-23 ENCOUNTER — Other Ambulatory Visit: Payer: Self-pay | Admitting: Family Medicine

## 2023-05-23 DIAGNOSIS — E782 Mixed hyperlipidemia: Secondary | ICD-10-CM

## 2023-06-10 ENCOUNTER — Encounter: Payer: Self-pay | Admitting: Gastroenterology

## 2023-07-04 ENCOUNTER — Other Ambulatory Visit: Payer: Self-pay | Admitting: Family Medicine

## 2023-07-04 ENCOUNTER — Other Ambulatory Visit: Payer: Self-pay | Admitting: Internal Medicine

## 2023-07-04 DIAGNOSIS — F39 Unspecified mood [affective] disorder: Secondary | ICD-10-CM

## 2023-07-04 DIAGNOSIS — F419 Anxiety disorder, unspecified: Secondary | ICD-10-CM

## 2023-07-22 ENCOUNTER — Telehealth: Payer: Self-pay | Admitting: Gastroenterology

## 2023-07-22 NOTE — Telephone Encounter (Signed)
Patient called having diverticulitis flare up seeking medication.

## 2023-07-23 MED ORDER — METRONIDAZOLE 500 MG PO TABS: 500.0000 mg | ORAL_TABLET | Freq: Two times a day (BID) | ORAL | 0 refills | Status: AC

## 2023-07-23 NOTE — Telephone Encounter (Signed)
Called and spoke with patient regarding recommendations below. Pt has requested that Metronidazole be sent to [facility] pharmacy on file. Pt has been advised to contact us in about a week with an update. Pt knows to call sooner if symptoms are not improving. Pt verbalized understanding and had no concerns at the end of the call.  Metronidazole prescription sent to pharmacy on file per pt request.

## 2023-07-23 NOTE — Telephone Encounter (Signed)
Difficult to tell for certain if it is diverticulitis or some kind of acute infectious diarrhea.  Given her prior history and symptoms, I we will treat her as if it is diverticulitis with short course of antibiotics.  We typically have to avoid ciprofloxacin for her because of a potential interaction with flecainide.  Metronidazole 500 mg tablet 1 tablet twice daily for 7 days Dispense 14 tablets, refill 0  Call us in 7 to 10 days (or sooner if needed) with an update.   - H Danis

## 2023-07-23 NOTE — Telephone Encounter (Signed)
Called and spoke with patient regarding diverticulitis flare up. Pt reports that symptoms started Tuesday afternoon. Low "gut pain" and spasms, pain increases significantly right before patient has to have a BM. Stools are loose and she has "quite a few" throughout the day. Denies any fever, chills, nausea, or vomiting. Please advise, thanks.

## 2023-07-23 NOTE — Telephone Encounter (Signed)
This has been addressed. See 8/22 telephone encounter

## 2023-08-04 ENCOUNTER — Other Ambulatory Visit: Payer: Self-pay

## 2023-08-04 DIAGNOSIS — E119 Type 2 diabetes mellitus without complications: Secondary | ICD-10-CM

## 2023-08-04 DIAGNOSIS — I10 Essential (primary) hypertension: Secondary | ICD-10-CM

## 2023-08-04 DIAGNOSIS — E782 Mixed hyperlipidemia: Secondary | ICD-10-CM

## 2023-08-08 ENCOUNTER — Other Ambulatory Visit: Payer: Self-pay | Admitting: Internal Medicine

## 2023-08-09 ENCOUNTER — Other Ambulatory Visit: Payer: PPO

## 2023-08-09 DIAGNOSIS — I10 Essential (primary) hypertension: Secondary | ICD-10-CM

## 2023-08-09 DIAGNOSIS — E782 Mixed hyperlipidemia: Secondary | ICD-10-CM | POA: Diagnosis not present

## 2023-08-09 DIAGNOSIS — E119 Type 2 diabetes mellitus without complications: Secondary | ICD-10-CM | POA: Diagnosis not present

## 2023-08-10 LAB — CBC WITH DIFFERENTIAL/PLATELET
Basophils Absolute: 0.1 10*3/uL (ref 0.0–0.2)
Basos: 1 %
EOS (ABSOLUTE): 0.1 10*3/uL (ref 0.0–0.4)
Eos: 2 %
Hematocrit: 40.8 % (ref 34.0–46.6)
Hemoglobin: 13 g/dL (ref 11.1–15.9)
Immature Grans (Abs): 0 10*3/uL (ref 0.0–0.1)
Immature Granulocytes: 0 %
Lymphocytes Absolute: 1 10*3/uL (ref 0.7–3.1)
Lymphs: 21 %
MCH: 27.9 pg (ref 26.6–33.0)
MCHC: 31.9 g/dL (ref 31.5–35.7)
MCV: 88 fL (ref 79–97)
Monocytes Absolute: 0.8 10*3/uL (ref 0.1–0.9)
Monocytes: 15 %
Neutrophils Absolute: 3.1 10*3/uL (ref 1.4–7.0)
Neutrophils: 61 %
Platelets: 204 10*3/uL (ref 150–450)
RBC: 4.66 x10E6/uL (ref 3.77–5.28)
RDW: 13.9 % (ref 11.7–15.4)
WBC: 5 10*3/uL (ref 3.4–10.8)

## 2023-08-10 LAB — LIPID PANEL
Chol/HDL Ratio: 2.6 ratio (ref 0.0–4.4)
Cholesterol, Total: 143 mg/dL (ref 100–199)
HDL: 56 mg/dL (ref >39)
LDL Chol Calc (NIH): 64 mg/dL (ref 0–99)
Triglycerides: 134 mg/dL (ref 0–149)
VLDL Cholesterol Cal: 23 mg/dL (ref 5–40)

## 2023-08-10 LAB — HEMOGLOBIN A1C
Est. average glucose Bld gHb Est-mCnc: 140 mg/dL
Hgb A1c MFr Bld: 6.5 % — ABNORMAL HIGH (ref 4.8–5.6)

## 2023-08-10 LAB — COMPREHENSIVE METABOLIC PANEL
ALT: 15 IU/L (ref 0–32)
AST: 20 IU/L (ref 0–40)
Albumin: 4.4 g/dL (ref 3.8–4.8)
Alkaline Phosphatase: 97 IU/L (ref 44–121)
BUN/Creatinine Ratio: 15 (ref 12–28)
BUN: 15 mg/dL (ref 8–27)
Bilirubin Total: 0.3 mg/dL (ref 0.0–1.2)
CO2: 22 mmol/L (ref 20–29)
Calcium: 9.4 mg/dL (ref 8.7–10.3)
Chloride: 102 mmol/L (ref 96–106)
Creatinine, Ser: 0.98 mg/dL (ref 0.57–1.00)
Globulin, Total: 2.4 g/dL (ref 1.5–4.5)
Glucose: 116 mg/dL — ABNORMAL HIGH (ref 70–99)
Potassium: 4.6 mmol/L (ref 3.5–5.2)
Sodium: 140 mmol/L (ref 134–144)
Total Protein: 6.8 g/dL (ref 6.0–8.5)
eGFR: 60 mL/min/{1.73_m2} (ref >59)

## 2023-08-12 ENCOUNTER — Ambulatory Visit (INDEPENDENT_AMBULATORY_CARE_PROVIDER_SITE_OTHER): Payer: PPO

## 2023-08-12 VITALS — Ht 65.0 in | Wt 156.0 lb

## 2023-08-12 DIAGNOSIS — Z0000 Encounter for general adult medical examination without abnormal findings: Secondary | ICD-10-CM | POA: Diagnosis not present

## 2023-08-12 NOTE — Patient Instructions (Addendum)
Daisy Nelson , Thank you for taking time to come for your Medicare Wellness Visit. I appreciate your ongoing commitment to your health goals. Please review the following plan we discussed and let me know if I can assist you in the future.   Referrals/Orders/Follow-Ups/Clinician Recommendations:   This is a list of the screening recommended for you and due dates:  Health Maintenance  Topic Date Due   COVID-19 Vaccine (3 - Pfizer risk series) 03/12/2020   Flu Shot  07/01/2023   Yearly kidney health urinalysis for diabetes  07/15/2023   Complete foot exam   11/06/2023   Eye exam for diabetics  01/20/2024   Hemoglobin A1C  02/06/2024   Yearly kidney function blood test for diabetes  08/08/2024   Medicare Annual Wellness Visit  08/11/2024   Colon Cancer Screening  08/19/2025   DTaP/Tdap/Td vaccine (2 - Td or Tdap) 07/06/2032   Pneumonia Vaccine  Completed   DEXA scan (bone density measurement)  Completed   Hepatitis C Screening  Completed   Zoster (Shingles) Vaccine  Completed   HPV Vaccine  Aged Out    Advanced directives: (Declined) Advance directive discussed with you today. Even though you declined this today, please call our office should you change your mind, and we can give you the proper paperwork for you to fill out.  Next Medicare Annual Wellness Visit scheduled for next year: Yes

## 2023-08-12 NOTE — Progress Notes (Signed)
Subjective:   Daisy Nelson is a 77 y.o. female who presents for Medicare Annual (Subsequent) preventive examination.  Visit Complete: Virtual  I connected with  Shary Key on 08/12/23 by a audio enabled telemedicine application and verified that I am speaking with the correct person using two identifiers.  Patient Location: Home  Provider Location: Home Office  I discussed the limitations of evaluation and management by telemedicine. The patient expressed understanding and agreed to proceed.  Patient Medicare AWV questionnaire was completed by the patient on 08/09/23; I have confirmed that all information answered by patient is correct and no changes since this date.  Review of Systems    Vital Signs: Unable to obtain new vitals due to this being a telehealth visit.  Cardiac Risk Factors include: advanced age (>27men, >38 women);diabetes mellitus;hypertension     Objective:    Today's Vitals   08/09/23 1408 08/12/23 1307  Weight:  156 lb (70.8 kg)  Height:  5\' 5"  (1.651 m)  PainSc: 0-No pain    Body mass index is 25.96 kg/m.     08/12/2023    1:18 PM 08/19/2020   10:05 AM 05/10/2018   11:17 AM 09/20/2017    1:26 PM 08/25/2016    4:28 PM 08/31/2015   10:51 AM 09/28/2014   10:54 AM  Advanced Directives  Does Patient Have a Medical Advance Directive? No No No No No No No  Would patient like information on creating a medical advance directive? No - Patient declined Yes (MAU/Ambulatory/Procedural Areas - Information given) No - Patient declined No - Patient declined No - patient declined information No - patient declined information Yes - Educational materials given    Current Medications (verified) Outpatient Encounter Medications as of 08/12/2023  Medication Sig   cimetidine (TAGAMET) 200 MG tablet Take 200 mg by mouth every evening.   flecainide (TAMBOCOR) 50 MG tablet TAKE 1 TABLET BY MOUTH TWICE DAILY . APPOINTMENT REQUIRED FOR FUTURE REFILLS   fluticasone  (FLONASE) 50 MCG/ACT nasal spray Place 2 sprays into both nostrils daily.   glucose blood test strip Use as instructed   Loratadine 10 MG CAPS    metFORMIN (GLUCOPHAGE) 500 MG tablet Take 1 tablet (500 mg total) by mouth daily with breakfast.   metoprolol succinate (TOPROL-XL) 25 MG 24 hr tablet Take 1 tablet by mouth once daily   rivaroxaban (XARELTO) 20 MG TABS tablet TAKE 1 TABLET BY MOUTH ONCE DAILY WITH SUPPER   rosuvastatin (CRESTOR) 20 MG tablet Take 1 tablet by mouth once daily   sertraline (ZOLOFT) 100 MG tablet Take 1 tablet by mouth once daily   No facility-administered encounter medications on file as of 08/12/2023.    Allergies (verified) Adhesive [tape], Erythromycin, Naproxen, and Penicillins   History: Past Medical History:  Diagnosis Date   AF (atrial fibrillation) (HCC)    AF with RVR 01/2013   Allergy    Anxiety    Atrial fibrillation with RVR (HCC) 08/31/2015   Colon polyps    Complication of anesthesia 2011   colonscopy- "slow to awaken"   Depression    DJD (degenerative joint disease) of cervical spine    Dysrhythmia    afib   Family history of CABG    GERD (gastroesophageal reflux disease)    History of kidney stones    Hyperglycemia    Hyperlipidemia    Hypertension    Jaw pain 08/31/2015   Pneumonia    PONV (postoperative nausea and vomiting)  Wears glasses    Past Surgical History:  Procedure Laterality Date   25 GAUGE PARS PLANA VITRECTOMY WITH 20 GAUGE MVR PORT FOR MACULAR HOLE Right 08/25/2016   Procedure: 25 GAUGE PARS PLANA VITRECTOMY; RIGHT EYE;  Surgeon: Sherrie George, MD;  Location: [facility] OR;  Service: Ophthalmology;  Laterality: Right;   ABDOMINAL HYSTERECTOMY  1991   CATARACT EXTRACTION W/ INTRAOCULAR LENS  IMPLANT, BILATERAL Bilateral 03/2013   CESAREAN SECTION  1976   COLONOSCOPY  2011   polyps   COLONOSCOPY WITH PROPOFOL N/A 08/19/2020   Procedure: COLONOSCOPY WITH PROPOFOL;  Surgeon: Sherrilyn Rist, MD;  Location: [facility]  ENDOSCOPY;  Service: Gastroenterology;  Laterality: N/A;   EYE SURGERY     GANGLION CYST EXCISION Left    hand   GAS/FLUID EXCHANGE Right 08/25/2016   Procedure: GAS/FLUID EXCHANGE RIGHT EYE;  Surgeon: Sherrie George, MD;  [facility] OR;  Service: Ophthalmology;  Laterality: Right;   IRIDECTOMY Right 08/25/2016   Procedure: PERIPHERAL IRIDECTOMY RIGHT EYE;  Surgeon: Sherrie George, MD;  Location: [facility] OR;  Service: Ophthalmology;  Laterality: Right;   LAPAROSCOPY  1976   MEMBRANE PEEL Right 08/25/2016   Procedure: MEMBRANE PEEL RIGHT EYE;  Surgeon: Sherrie George, MD;  Location: [facility] OR;  Service: Ophthalmology;  Laterality: Right;   NM MYOCAR PERF WALL MOTION  01/2013   lexiscan - no pharmacologically induced ischemia, fixed defects in apical segments of anterior septum & inferior lateral wall    PARS PLANA VITRECTOMY Right 08/25/2016   laser, gas injection, AC wash out. Membrane peel right eye   POLYPECTOMY N/A 08/19/2020   Procedure: POLYPECTOMY;  Surgeon: Sherrilyn Rist, MD;  Location: [facility] ENDOSCOPY;  Service: Gastroenterology;  Laterality: N/A;   TRANSTHORACIC ECHOCARDIOGRAM  01/2013   EF 55-60%, mod conc LVH; mild MR with mildly thickened leaflets; mild TR; PA peak pressure   TUBAL LIGATION  1977   Family History  Problem Relation Age of Onset   Stroke Mother    Diabetes Father    Heart disease Father        CABG   Arthritis Father        C6-7   Skin cancer Father        SCC   Arthritis Brother        s/p THR   Social History   Socioeconomic History   Marital status: Married    Spouse name: Molly Maduro   Number of children: 1   Years of education: 12   Highest education level: Not on file  Occupational History   Occupation: Advertising account planner  Tobacco Use   Smoking status: Former    Current packs/day: 0.00    Average packs/day: 0.5 packs/day for 41.8 years (20.9 ttl pk-yrs)    Types: Cigarettes    Start date: 03/15/1963    Quit date: 01/08/2005    Years since  quitting: 18.6    Passive exposure: Never   Smokeless tobacco: Never  Vaping Use   Vaping status: Never Used  Substance and Sexual Activity   Alcohol use: No   Drug use: No   Sexual activity: Yes    Partners: Male    Birth control/protection: Surgical  Other Topics Concern   Not on file  Social History Narrative   Lives with her husband and their cat, Ellie Mae.   Social Determinants of Health   Financial Resource Strain: Low Risk  (08/09/2023)   Overall Financial Resource Strain (CARDIA)  Difficulty of Paying Living Expenses: Not hard at all  Food Insecurity: No Food Insecurity (08/09/2023)   Hunger Vital Sign    Worried About Running Out of Food in the Last Year: Never true    Ran Out of Food in the Last Year: Never true  Transportation Needs: No Transportation Needs (08/09/2023)   PRAPARE - Administrator, Civil Service (Medical): No    Lack of Transportation (Non-Medical): No  Physical Activity: Unknown (08/09/2023)   Exercise Vital Sign    Days of Exercise per Week: 0 days    Minutes of Exercise per Session: Patient declined  Stress: No Stress Concern Present (08/09/2023)   [employer] of Occupational Health - Occupational Stress Questionnaire    Feeling of Stress : Not at all  Social Connections: Unknown (08/09/2023)   Social Connection and Isolation Panel [NHANES]    Frequency of Communication with Friends and Family: Twice a week    Frequency of Social Gatherings with Friends and Family: Once a week    Attends Religious Services: Not on Marketing executive or Organizations: No    Attends Engineer, structural: Patient declined    Marital Status: Married    Tobacco Counseling Counseling given: Not Answered   Clinical Intake:  Pre-visit preparation completed: Yes  Pain : No/denies pain Pain Score: 0-No pain     BMI - recorded: 25.96 Nutritional Status: BMI 25 -29 Overweight Nutritional Risks: None Diabetes: Yes CBG done?:  Yes (CBG 113 Per patient) CBG resulted in Enter/ Edit results?: Yes Did pt. bring in CBG monitor from home?: No  How often do you need to have someone help you when you read instructions, pamphlets, or other written materials from your doctor or pharmacy?: 1 - Never  Interpreter Needed?: No  Information entered by :: Theresa Mulligan LPN   Activities of Daily Living    08/09/2023    2:08 PM 11/05/2022    2:36 PM  In your present state of health, do you have any difficulty performing the following activities:  Hearing? 0 0  Vision? 0 0  Difficulty concentrating or making decisions? 0 0  Walking or climbing stairs? 0 0  Dressing or bathing? 0 0  Doing errands, shopping? 0 0  Preparing Food and eating ? N   Using the Toilet? N   In the past six months, have you accidently leaked urine? Y   Comment Wears a pad. Followed PCP   Do you have problems with loss of bowel control? N   Managing your Medications? N   Managing your Finances? N   Housekeeping or managing your Housekeeping? N     Patient Care Team: Melida Quitter, PA as PCP - General (Family Medicine) Rennis Golden Lisette Abu, MD as PCP - Cardiology (Cardiology) Rennis Golden Lisette Abu, MD as Consulting Physician (Cardiology) Sherrie George, MD as Consulting Physician (Ophthalmology) Jeani Hawking, MD as Consulting Physician (Gastroenterology) Myrtie Neither Andreas Blower, MD as Consulting Physician (Gastroenterology)  Indicate any recent Medical Services you may have received from other than [facility] providers in the past year (date may be approximate).     Assessment:   This is a routine wellness examination for Daisy Nelson.  Hearing/Vision screen Hearing Screening - Comments:: Denies hearing difficulties   Vision Screening - Comments:: Wears rx glasses - up to date with routine eye exams with  Dr Lucina Mellow   Goals Addressed  This Visit's Progress     Continue to adjust diet (pt-stated)         Depression Screen    08/12/2023     1:11 PM 05/12/2023    2:07 PM 11/05/2022    2:36 PM 07/06/2022    9:14 AM 05/07/2022    3:02 PM 02/24/2022   10:34 AM 11/25/2021    8:18 AM  PHQ 2/9 Scores  PHQ - 2 Score 0 0 0 0 0 0 0  PHQ- 9 Score 0 1 1 0 1 1 1     Fall Risk    08/09/2023    2:08 PM 05/12/2023    2:07 PM 07/06/2022    9:13 AM 05/07/2022    3:01 PM 02/24/2022   10:33 AM  Fall Risk   Falls in the past year? 0 0 1 1 1   Number falls in past yr: 0 0 1 1 1   Injury with Fall? 0 0 0 0 0  Risk for fall due to : No Fall Risks No Fall Risks History of fall(s) Impaired balance/gait;Impaired mobility History of fall(s)  Follow up Falls prevention discussed Falls evaluation completed Falls evaluation completed Falls evaluation completed Falls evaluation completed    MEDICARE RISK AT HOME: Medicare Risk at Home Any stairs in or around the home?: Yes If so, are there any without handrails?: No Home free of loose throw rugs in walkways, pet beds, electrical cords, etc?: Yes Adequate lighting in your home to reduce risk of falls?: Yes Life alert?: No Use of a cane, walker or w/c?: No Grab bars in the bathroom?: No Shower chair or bench in shower?: No Elevated toilet seat or a handicapped toilet?: Yes  TIMED UP AND GO:  Was the test performed?  No    Cognitive Function:        08/12/2023    1:18 PM 07/06/2022    9:39 AM 01/20/2021    8:37 AM 10/02/2019    9:52 AM 09/20/2017    1:36 PM  6CIT Screen  What Year? 0 points 0 points 0 points 0 points 0 points  What month? 0 points 0 points 0 points 0 points 0 points  What time? 0 points 0 points 0 points 0 points 0 points  Count back from 20 0 points 0 points 0 points 0 points 0 points  Months in reverse 0 points 0 points 0 points 0 points 0 points  Repeat phrase 0 points 2 points 0 points 0 points 2 points  Total Score 0 points 2 points 0 points 0 points 2 points    Immunizations Immunization History  Administered Date(s) Administered   Fluad Quad(high Dose 65+)  09/12/2019, 11/25/2021   Influenza Split 08/24/2011   Influenza, High Dose Seasonal PF 09/12/2018, 09/13/2020   Influenza, Seasonal, Injecte, Preservative Fre 11/15/2012   Influenza,inj,Quad PF,6+ Mos 08/03/2013, 09/28/2014, 09/24/2015, 09/22/2016, 08/30/2017   Influenza-Unspecified 08/30/2017, 09/07/2022   PFIZER(Purple Top)SARS-COV-2 Vaccination 01/20/2020, 02/13/2020   PNEUMOCOCCAL CONJUGATE-20 05/13/2023   Pneumococcal Conjugate-13 09/28/2014   Pneumococcal Polysaccharide-23 08/03/2013   Tdap 07/06/2022   Zoster Recombinant(Shingrix) 10/04/2019, 12/05/2019   Zoster, Live 11/27/2013    TDAP status: Up to date  Flu Vaccine status: Due, Education has been provided regarding the importance of this vaccine. Advised may receive this vaccine at local pharmacy or Health Dept. Aware to provide a copy of the vaccination record if obtained from local pharmacy or Health Dept. Verbalized acceptance and understanding.  Pneumococcal vaccine status: Up to date  Covid-19 vaccine status:  Declined, Education has been provided regarding the importance of this vaccine but patient still declined. Advised may receive this vaccine at local pharmacy or Health Dept.or vaccine clinic. Aware to provide a copy of the vaccination record if obtained from local pharmacy or Health Dept. Verbalized acceptance and understanding.  Qualifies for Shingles Vaccine? Yes   Zostavax completed Yes   Shingrix Completed?: Yes  Screening Tests Health Maintenance  Topic Date Due   COVID-19 Vaccine (3 - Pfizer risk series) 03/12/2020   INFLUENZA VACCINE  07/01/2023   Diabetic kidney evaluation - Urine ACR  07/15/2023   FOOT EXAM  11/06/2023   OPHTHALMOLOGY EXAM  01/20/2024   HEMOGLOBIN A1C  02/06/2024   Diabetic kidney evaluation - eGFR measurement  08/08/2024   Medicare Annual Wellness (AWV)  08/11/2024   Colonoscopy  08/19/2025   DTaP/Tdap/Td (2 - Td or Tdap) 07/06/2032   Pneumonia Vaccine 48+ Years old  Completed    DEXA SCAN  Completed   Hepatitis C Screening  Completed   Zoster Vaccines- Shingrix  Completed   HPV VACCINES  Aged Out    Health Maintenance  Health Maintenance Due  Topic Date Due   COVID-19 Vaccine (3 - Pfizer risk series) 03/12/2020   INFLUENZA VACCINE  07/01/2023   Diabetic kidney evaluation - Urine ACR  07/15/2023    Colorectal cancer screening: Type of screening: Colonoscopy. Completed 08/19/20. Repeat every 5 years    Bone Density status: Completed 03/15/20. Results reflect: Bone density results: NORMAL. Repeat every   years.    Additional Screening:  Hepatitis C Screening: does qualify; Completed 05/12/23  Vision Screening: Recommended annual ophthalmology exams for early detection of glaucoma and other disorders of the eye. Is the patient up to date with their annual eye exam?  Yes  Who is the provider or what is the name of the office in which the patient attends annual eye exams? Dr Lucina Mellow If pt is not established with a provider, would they like to be referred to a provider to establish care? No .   Dental Screening: Recommended annual dental exams for proper oral hygiene  Diabetic Foot Exam: Diabetic Foot Exam: Completed 11/05/22  Community Resource Referral / Chronic Care Management:  CRR required this visit?  No   CCM required this visit?  No     Plan:     I have personally reviewed and noted the following in the patient's chart:   Medical and social history Use of alcohol, tobacco or illicit drugs  Current medications and supplements including opioid prescriptions. Patient is not currently taking opioid prescriptions. Functional ability and status Nutritional status Physical activity Advanced directives List of other physicians Hospitalizations, surgeries, and ER visits in previous 12 months Vitals Screenings to include cognitive, depression, and falls Referrals and appointments  In addition, I have reviewed and discussed with patient certain  preventive protocols, quality metrics, and best practice recommendations. A written personalized care plan for preventive services as well as general preventive health recommendations were provided to patient.     Tillie Rung, LPN   5/62/1308   After Visit Summary: (MyChart) Due to this being a telephonic visit, the after visit summary with patients personalized plan was offered to patient via MyChart   Nurse Notes: None

## 2023-08-16 ENCOUNTER — Ambulatory Visit: Payer: PPO | Admitting: Family Medicine

## 2023-08-23 ENCOUNTER — Ambulatory Visit (INDEPENDENT_AMBULATORY_CARE_PROVIDER_SITE_OTHER): Payer: PPO | Admitting: Family Medicine

## 2023-08-23 ENCOUNTER — Encounter: Payer: Self-pay | Admitting: Family Medicine

## 2023-08-23 VITALS — BP 119/57 | HR 54 | Resp 18 | Ht 65.0 in | Wt 150.0 lb

## 2023-08-23 DIAGNOSIS — E119 Type 2 diabetes mellitus without complications: Secondary | ICD-10-CM | POA: Diagnosis not present

## 2023-08-23 DIAGNOSIS — E782 Mixed hyperlipidemia: Secondary | ICD-10-CM | POA: Diagnosis not present

## 2023-08-23 DIAGNOSIS — E1159 Type 2 diabetes mellitus with other circulatory complications: Secondary | ICD-10-CM | POA: Diagnosis not present

## 2023-08-23 DIAGNOSIS — E1169 Type 2 diabetes mellitus with other specified complication: Secondary | ICD-10-CM | POA: Diagnosis not present

## 2023-08-23 DIAGNOSIS — E785 Hyperlipidemia, unspecified: Secondary | ICD-10-CM

## 2023-08-23 DIAGNOSIS — I10 Essential (primary) hypertension: Secondary | ICD-10-CM

## 2023-08-23 DIAGNOSIS — I152 Hypertension secondary to endocrine disorders: Secondary | ICD-10-CM | POA: Diagnosis not present

## 2023-08-23 LAB — POCT UA - MICROALBUMIN
Creatinine, POC: 50 mg/dL
Microalbumin Ur, POC: 10 mg/L

## 2023-08-23 MED ORDER — METFORMIN HCL 500 MG PO TABS: 500.0000 mg | ORAL_TABLET | Freq: Every day | ORAL | 1 refills | Status: DC

## 2023-08-23 MED ORDER — ROSUVASTATIN CALCIUM 20 MG PO TABS
20.0000 mg | ORAL_TABLET | Freq: Every day | ORAL | 0 refills | Status: DC
Start: 2023-08-23 — End: 2023-11-25

## 2023-08-23 NOTE — Progress Notes (Signed)
Established Patient Office Visit  Subjective   Patient ID: Daisy Nelson, female    DOB: 21-Jun-1946  Age: 77 y.o. MRN: [id number]  Chief Complaint  Patient presents with   Diabetes   Hyperlipidemia   Hypertension    HPI Daisy Nelson is a 77 y.o. female presenting today for follow up of hypertension, hyperlipidemia, diabetes. Hypertension: Patient here for follow-up of elevated blood pressure. She is exercising and is adherent to low salt diet.   Pt denies chest pain, SOB, dizziness, edema, syncope, fatigue or heart palpitations. Taking metoprolol succinate 25 mg daily as prescribed by cardiology, reports excellent compliance with treatment. Denies side effects.  She does not typically check her blood pressure at home. Hyperlipidemia: tolerating rosuvastatin well with no myalgias or significant side effects. Currently consuming a low fat and low carb/sugar diet.  The 10-year ASCVD risk score (Arnett DK, et al., 2019) is: 31.8% Diabetes: denies hypoglycemic events, wounds or sores that are not healing well, increased thirst or urination. Denies vision problems, eye exam completed 01/19/2023. Taking metformin 500 mg once daily as prescribed without any side effects.   Outpatient Medications Prior to Visit  Medication Sig   cimetidine (TAGAMET) 200 MG tablet Take 200 mg by mouth every evening.   flecainide (TAMBOCOR) 50 MG tablet TAKE 1 TABLET BY MOUTH TWICE DAILY . APPOINTMENT REQUIRED FOR FUTURE REFILLS   fluticasone (FLONASE) 50 MCG/ACT nasal spray Place 2 sprays into both nostrils daily.   glucose blood test strip Use as instructed   Loratadine 10 MG CAPS    metoprolol succinate (TOPROL-XL) 25 MG 24 hr tablet Take 1 tablet by mouth once daily   rivaroxaban (XARELTO) 20 MG TABS tablet TAKE 1 TABLET BY MOUTH ONCE DAILY WITH SUPPER   sertraline (ZOLOFT) 100 MG tablet Take 1 tablet by mouth once daily   [DISCONTINUED] metFORMIN (GLUCOPHAGE) 500 MG tablet Take 1 tablet (500 mg  total) by mouth daily with breakfast.   [DISCONTINUED] rosuvastatin (CRESTOR) 20 MG tablet Take 1 tablet by mouth once daily   No facility-administered medications prior to visit.    ROS Negative unless otherwise noted in HPI   Objective:     BP (!) 119/57 (BP Location: Left Arm, Patient Position: Sitting, Cuff Size: Normal)   Pulse (!) 54   Resp 18   Ht 5\' 5"  (1.651 m)   Wt 150 lb (68 kg)   SpO2 95%   BMI 24.96 kg/m   Physical Exam Constitutional:      General: She is not in acute distress.    Appearance: Normal appearance.  HENT:     Head: Normocephalic and atraumatic.  Cardiovascular:     Rate and Rhythm: Normal rate and regular rhythm.     Heart sounds: No murmur heard.    No friction rub. No gallop.  Pulmonary:     Effort: Pulmonary effort is normal. No respiratory distress.     Breath sounds: No wheezing, rhonchi or rales.  Skin:    General: Skin is warm and dry.  Neurological:     Mental Status: She is alert and oriented to person, place, and time.     Assessment & Plan:  Hypertension associated with diabetes (HCC) Assessment & Plan: BP goal <130/80.  Stable, at goal.  Blood pressure and heart rate have been on the lower end consistently for the past year.  She does have an upcoming appointment with cardiology next month, will monitor to see if they make any adjustments to  her metoprolol succinate prescription.   Hyperlipidemia associated with type 2 diabetes mellitus (HCC) Assessment & Plan: Last lipid panel: LDL 64, HDL 56, triglycerides 134.  Hepatic function within normal limits.  Continue rosuvastatin 20 mg daily.  Will continue to monitor.   Type 2 diabetes mellitus without complication, without long-term current use of insulin (HCC) Assessment & Plan: A1c has improved to 6.5 from 7.0.  Continue low-carb/sugar nutrition plan and staying active.  Continue metformin 500 mg once daily.  Will continue to monitor.  Orders: -     POCT UA -  Microalbumin -     metFORMIN HCl; Take 1 tablet (500 mg total) by mouth daily with breakfast.  Dispense: 90 tablet; Refill: 1  Mixed hyperlipidemia -     Rosuvastatin Calcium; Take 1 tablet (20 mg total) by mouth daily.  Dispense: 90 tablet; Refill: 0  She had a flu shot at [facility], requesting records.  Return in about 4 months (around 12/23/2023) for follow-up for HTN, HLD, DM, POC A1C at visit.    Melida Quitter, PA

## 2023-08-23 NOTE — Assessment & Plan Note (Signed)
BP goal <130/80.  Stable, at goal.  Blood pressure and heart rate have been on the lower end consistently for the past year.  She does have an upcoming appointment with cardiology next month, will monitor to see if they make any adjustments to her metoprolol succinate prescription.

## 2023-08-23 NOTE — Assessment & Plan Note (Signed)
A1c has improved to 6.5 from 7.0.  Continue low-carb/sugar nutrition plan and staying active.  Continue metformin 500 mg once daily.  Will continue to monitor.

## 2023-08-23 NOTE — Assessment & Plan Note (Signed)
Last lipid panel: LDL 64, HDL 56, triglycerides 134.  Hepatic function within normal limits.  Continue rosuvastatin 20 mg daily.  Will continue to monitor.

## 2023-09-02 ENCOUNTER — Encounter: Payer: Self-pay | Admitting: Gastroenterology

## 2023-09-02 ENCOUNTER — Ambulatory Visit: Payer: PPO | Admitting: Gastroenterology

## 2023-09-02 VITALS — BP 110/70 | HR 57 | Ht 65.0 in | Wt 152.0 lb

## 2023-09-02 DIAGNOSIS — K219 Gastro-esophageal reflux disease without esophagitis: Secondary | ICD-10-CM | POA: Diagnosis not present

## 2023-09-02 DIAGNOSIS — Z860100 Personal history of colon polyps, unspecified: Secondary | ICD-10-CM | POA: Diagnosis not present

## 2023-09-02 NOTE — Patient Instructions (Signed)
Follow up as needed.  _______________________________________________________  If your blood pressure at your visit was 140/90 or greater, please contact your primary care physician to follow up on this.  _______________________________________________________  If you are age 75 or older, your body mass index should be between 23-30. Your Body mass index is 25.29 kg/m. If this is out of the aforementioned range listed, please consider follow up with your Primary Care Provider.  If you are age 19 or younger, your body mass index should be between 19-25. Your Body mass index is 25.29 kg/m. If this is out of the aformentioned range listed, please consider follow up with your Primary Care Provider.   ________________________________________________________  The [facility] providers would like to encourage you to use [facility] to communicate with providers for non-urgent requests or questions.  Due to long hold times on the telephone, sending your provider a message by [facility] may be a faster and more efficient way to get a response.  Please allow 48 business hours for a response.  Please remember that this is for non-urgent requests.  _______________________________________________________

## 2023-09-02 NOTE — Progress Notes (Signed)
09/02/2023 Daisy Nelson [id number] [birth year]   HISTORY OF PRESENT ILLNESS: This is a 77 year old female who is a patient of Dr. Irving Burton.  She has a history of atrial fibrillation and is on Xarelto.  Has a history of history of adenomatous colon polyps.  Her last colonoscopy was in September 2021 which time she had 6 polyps removed.  Repeat was recommended at a 3-year interval.  She is here today to discuss that colonoscopy.  She expresses that she does not want to proceed with any further colonoscopy at her age.  She moves her bowels regularly and has no rectal bleeding.  She is taking Tagamet/cimetidine daily for reflux and says that that is controlling her symptoms well.  She is just wondering if it is okay to continue taking that long-term.  She was just treated for diverticulitis at the end of August, but symptoms completely resolved with antibiotics.  No further abdominal pain.   Past Medical History:  Diagnosis Date   AF (atrial fibrillation) (HCC)    AF with RVR 01/2013   Allergy    Anxiety    Atrial fibrillation with RVR (HCC) 08/31/2015   Colon polyps    Complication of anesthesia 2011   colonscopy- "slow to awaken"   Depression    DJD (degenerative joint disease) of cervical spine    Dysrhythmia    afib   Family history of CABG    GERD (gastroesophageal reflux disease)    History of kidney stones    Hyperglycemia    Hyperlipidemia    Hypertension    Jaw pain 08/31/2015   Pneumonia    PONV (postoperative nausea and vomiting)    Wears glasses    Past Surgical History:  Procedure Laterality Date   25 GAUGE PARS PLANA VITRECTOMY WITH 20 GAUGE MVR PORT FOR MACULAR HOLE Right 08/25/2016   Procedure: 25 GAUGE PARS PLANA VITRECTOMY; RIGHT EYE;  Surgeon: Sherrie George, MD;  Location: [facility] OR;  Service: Ophthalmology;  Laterality: Right;   ABDOMINAL HYSTERECTOMY  1991   CATARACT EXTRACTION W/ INTRAOCULAR LENS  IMPLANT, BILATERAL Bilateral 03/2013   CESAREAN SECTION   1976   COLONOSCOPY  2011   polyps   COLONOSCOPY WITH PROPOFOL N/A 08/19/2020   Procedure: COLONOSCOPY WITH PROPOFOL;  Surgeon: Sherrilyn Rist, MD;  Location: [facility];  Service: Gastroenterology;  Laterality: N/A;   EYE SURGERY     GANGLION CYST EXCISION Left    hand   GAS/FLUID EXCHANGE Right 08/25/2016   Procedure: GAS/FLUID EXCHANGE RIGHT EYE;  Surgeon: Sherrie George, MD;  Location: [facility] OR;  Service: Ophthalmology;  Laterality: Right;   IRIDECTOMY Right 08/25/2016   Procedure: PERIPHERAL IRIDECTOMY RIGHT EYE;  Surgeon: Sherrie George, MD;  Location: [facility] OR;  Service: Ophthalmology;  Laterality: Right;   LAPAROSCOPY  1976   MEMBRANE PEEL Right 08/25/2016   Procedure: MEMBRANE PEEL RIGHT EYE;  Surgeon: Sherrie George, MD;  Location: [facility] OR;  Service: Ophthalmology;  Laterality: Right;   NM MYOCAR PERF WALL MOTION  01/2013   lexiscan - no pharmacologically induced ischemia, fixed defects in apical segments of anterior septum & inferior lateral wall    PARS PLANA VITRECTOMY Right 08/25/2016   laser, gas injection, AC wash out. Membrane peel right eye   POLYPECTOMY N/A 08/19/2020   Procedure: POLYPECTOMY;  Surgeon: Sherrilyn Rist, MD;  Location: [facility];  Service: Gastroenterology;  Laterality: N/A;   TRANSTHORACIC ECHOCARDIOGRAM  01/2013   EF 55-60%,  mod conc LVH; mild MR with mildly thickened leaflets; mild TR; PA peak pressure   TUBAL LIGATION  1977    reports that she quit smoking about 18 years ago. Her smoking use included cigarettes. She started smoking about 60 years ago. She has a 20.9 pack-year smoking history. She has never been exposed to tobacco smoke. She has never used smokeless tobacco. She reports that she does not drink alcohol and does not use drugs. family history includes Arthritis in her brother and father; Diabetes in her father; Heart disease in her father; Skin cancer in her father; Stroke in her mother. Allergies  Allergen Reactions   Adhesive  [Tape]     Burn skin   Erythromycin     Gi intolerance - nausea, vomiting, and abd pains   Naproxen Nausea And Vomiting    Abdominal pain    Penicillins Hives    Has patient had a PCN reaction causing immediate rash, facial/tongue/throat swelling, SOB or lightheadedness with hypotension: UNKNOWN Has patient had a PCN reaction causing severe rash involving mucus membranes or skin necrosis: No Has patient had a PCN reaction that required hospitalization No Has patient had a PCN reaction occurring within the last 10 years: No If all of the above answers are "NO", then may proceed with Cephalosporin use.       Outpatient Encounter Medications as of 09/02/2023  Medication Sig   acetaminophen (TYLENOL) 650 MG CR tablet Take 650 mg by mouth every 8 (eight) hours as needed for pain. For arthritis of hands.   cimetidine (TAGAMET) 200 MG tablet Take 200 mg by mouth every evening.   flecainide (TAMBOCOR) 50 MG tablet TAKE 1 TABLET BY MOUTH TWICE DAILY . APPOINTMENT REQUIRED FOR FUTURE REFILLS   fluticasone (FLONASE) 50 MCG/ACT nasal spray Place 2 sprays into both nostrils daily.   glucose blood test strip Use as instructed   Loratadine 10 MG CAPS    metFORMIN (GLUCOPHAGE) 500 MG tablet Take 1 tablet (500 mg total) by mouth daily with breakfast.   metoprolol succinate (TOPROL-XL) 25 MG 24 hr tablet Take 1 tablet by mouth once daily   rivaroxaban (XARELTO) 20 MG TABS tablet TAKE 1 TABLET BY MOUTH ONCE DAILY WITH SUPPER   rosuvastatin (CRESTOR) 20 MG tablet Take 1 tablet (20 mg total) by mouth daily.   sertraline (ZOLOFT) 100 MG tablet Take 1 tablet by mouth once daily   No facility-administered encounter medications on file as of 09/02/2023.     REVIEW OF SYSTEMS  : All other systems reviewed and negative except where noted in the History of Present Illness.   PHYSICAL EXAM: BP 110/70   Pulse (!) 57   Ht 5\' 5"  (1.651 m)   Wt 152 lb (68.9 kg)   BMI 25.29 kg/m  General: Well developed  white female in no acute distress Head: Normocephalic and atraumatic Eyes:  Sclerae anicteric, conjunctiva pink. Ears: Normal auditory acuity Lungs: Clear throughout to auscultation; no W/R/R. Heart: Slightly bradycardic with regular rhythm; no M/R/G. Musculoskeletal: Symmetrical with no gross deformities  Skin: No lesions on visible extremities Extremities: No edema  Neurological: Alert oriented x 4, grossly non-focal Psychological:  Alert and cooperative. Normal mood and affect  ASSESSMENT AND PLAN: *Personal history of colon polyps: Last colonoscopy in 2021 at which time she had 6 tubular adenomas removed.  Repeat recommended in 3 years.  She is on Xarelto.  She tells me that she does not wish to proceed with another colonoscopy.  Understands the  possibility of development of further adenomas.  She will call back if she decides to proceed/changes her mind. *GERD: Taking over-the-counter Tagamet/cimetidine with good control of her symptoms.  She is asking if it is okay to continue this long-term.  Absolutely fine to continue.   CC:  Melida Quitter, PA

## 2023-09-03 NOTE — Progress Notes (Signed)
____________________________________________________________  Attending physician addendum:  Thank you for sending this case to me. I have reviewed the entire note and agree with the plan.  We will be available if she changes her mind about having the colonoscopy.  Amada Jupiter, MD  ____________________________________________________________

## 2023-09-06 ENCOUNTER — Other Ambulatory Visit: Payer: Self-pay | Admitting: Internal Medicine

## 2023-09-28 ENCOUNTER — Encounter: Payer: Self-pay | Admitting: Internal Medicine

## 2023-09-28 ENCOUNTER — Ambulatory Visit: Payer: PPO | Attending: Internal Medicine | Admitting: Internal Medicine

## 2023-09-28 VITALS — BP 108/66 | HR 51 | Ht 63.0 in | Wt 153.6 lb

## 2023-09-28 DIAGNOSIS — E785 Hyperlipidemia, unspecified: Secondary | ICD-10-CM | POA: Diagnosis not present

## 2023-09-28 DIAGNOSIS — Z5181 Encounter for therapeutic drug level monitoring: Secondary | ICD-10-CM | POA: Diagnosis not present

## 2023-09-28 DIAGNOSIS — Z79899 Other long term (current) drug therapy: Secondary | ICD-10-CM

## 2023-09-28 DIAGNOSIS — I700 Atherosclerosis of aorta: Secondary | ICD-10-CM | POA: Diagnosis not present

## 2023-09-28 DIAGNOSIS — I480 Paroxysmal atrial fibrillation: Secondary | ICD-10-CM

## 2023-09-28 NOTE — Progress Notes (Signed)
OFFICE NOTE  Chief Complaint:  No complaints  Primary Care Physician: Melida Quitter, PA  ID:  Daisy Nelson, DOB 09-13-1946, MRN [id number]  PCP:  Melida Quitter, PA  Primary Cardiologist:  Jaeli Grubb     History of Present Illness: Daisy Nelson is a 77 y.o. female no prior history of CAD or AF, presented to the ER 3/10 around 3am with complaints of jaw pain, palpitations, and SOB. Her symptoms woke her from sleep around 12:30am. In the ER she was noted to be in Rapid AF. Her EKG shows diffuse ST depression. Initial Troponin is negative. Her symptoms improved after Diltiazem IV was started. She denied any prior history of similar symptoms in the past. She did take Mucinex D earlier this week for sinus infection.   The patient was admitted to [facility] and started on diltiazem, heparin, ASA, beta blocker and nitrates. She spontaneously converted to NSR and ruled out for MI. Lexiscan myovew revealed normal LVF, no ischemia and fixed defects involving the apical segments of the inferior septum and inferior lateral walls. 2D echo confirmed normal EF of 55-60%. Mild TR. CHADSVSC= 1. She was started on Xarelto.  Lopressor was continued.   She was last seen by Wilburt Finlay, PA-C, as she was about to have cataract surgery.  It went very well for her and she can now see clearly.  She reports only an accessional fluttering in her chest.  I did decrease her lopressor to 12.5 mg bid during the last visit because she was having a HR in the 40's.  She otherwise denies  nausea, vomiting, fever, chest pain, shortness of breath, orthopnea, dizziness, PND, cough, congestion, abdominal pain, hematochezia, melena, lower extremity edema.  Daisy Nelson back in the office today. She is currently without complaints. She is active denies any chest pain or shortness of breath. She very rarely gets palpitations the last only for a few seconds. She denies any recurrent atrial fibrillation and her EKG today sinus  rhythm. She's had no bleeding problems on aspirin. She had laboratory work in October which shows a well controlled lipid profile on pravastatin. Weight is stable  I saw Daisy Nelson back today in the office. Overall she is doing very well. She denies any chest pain or shortness of breath. She's had no recurrent A. Fib episodes. She has not needed to take her flecainide as needed. She continues on Xarelto without any bleeding problems. Blood pressure is well-controlled today.  01/18/2017  Mrs. Daisy Nelson returns today for follow-up. She reports no significant recurrent atrial fibrillation. EKG today shows sinus bradycardia with nonspecific ST changes. She is tolerating Xarelto without any bleeding problems. She has not needed the flecainide pocket pill strategy, but is due for a refill. QTC is 395 ms.  01/11/2018  Mrs. Daisy Nelson was seen today in follow-up.  Overall she reports doing well.  She denies any significant recurrent atrial fibrillation.  She occasionally feels palpitations.  She does not do active heart rate monitoring.  We discussed the [employer] application as well as the newest iteration of the apple watch, both of which can monitor heart rhythms.  He is tolerating Xarelto without bleeding problems.  She has not required flecainide.  She may need a new prescription as a drug could be older and she should check back with [place] when she looks at her bottle.  06/29/2019  [name] is seen today in follow-up.  Overall she continues to do well.  She says last summer she had  one episode of what she thought was A. fib and took 2 flecainide at night and went to sleep.  She says she woke up and she felt fine without any recurrence.  She continues to monitor heart rate and has not noted any recurrent A. fib.  She denies any bleeding complications on Xarelto.  Overall energy level is good.  She is followed by her PCP closely and recently did a virtual visit.  Blood pressures well controlled  today.  07/18/2020  [name] is seen today in follow-up.  Overall she is doing well.  She says that recently though she has had more episodes of atrial fibrillation.  She had an episode in December which responded to pocket pill flecainide.  Since then she has had 3 other episodes and they generally last less than 24 hours and seem to respond to flecainide but she feels drained for at least the next 24 to 48 hours.  Her husband was diagnosed with multivessel coronary disease this spring as well and underwent bypass surgery and he is doing better.  She does have a screening colonoscopy scheduled for September.  10/22/2020  [name] returns today for follow-up.  Reports that she recently developed diverticulitis episode shortly after having her colonoscopy.  She is currently on Cipro and Flagyl, however her gastroenterologist may not have known she was taking flecainide.  This can interact with Cipro causing QT prolongation.  Fortunately her EKG is stable today showing sinus bradycardia with a QTC of 434 ms.  She denies any chest pain.  She is not actually had repeat stress testing since a Myoview stress test while hospitalized in 2014.  Recent imaging of the abdomen showed aortic atherosclerosis.  That being said her blood pressure is well controlled.  She is on high potency statin therapy with well-controlled dyslipidemia and a total cholesterol 121, HDL 55, triglycerides 90 and LDL of 49.  05/20/2021  [name] is seen today in follow-up.  She reports good control over her A. fib on flecainide.  QTC today was 407 ms.  She is in sinus bradycardia.  She reports tolerating Xarelto without any issues.  She not undergo treadmill stress testing.  She was having some issues with her husband's health and she was caring for him and then developed problems with her hip and does not feel that she can walk on a treadmill.  09/28/2023  [name] returns today for follow-up.  She says she has overall  been doing very well.  She denies any recurrent A-fib.  She remains on flecainide.  She denies any chest pain or worsening shortness of breath with exertion.  Blood pressure is well-controlled.  EKG shows sinus rhythm today.  She is on Xarelto without any bleeding issues or easy falls.  Cholesterol is also well treated with total 143, HDL 56, LDL 64 and triglycerides 134.  Hemoglobin A1c was 6.5%.   Wt Readings from Last 3 Encounters:  09/28/23 153 lb 9.6 oz (69.7 kg)  09/02/23 152 lb (68.9 kg)  08/23/23 150 lb (68 kg)     Past Medical History:  Diagnosis Date   AF (atrial fibrillation) (HCC)    AF with RVR 01/2013   Allergy    Anxiety    Atrial fibrillation with RVR (HCC) 08/31/2015   Colon polyps    Complication of anesthesia 2011   colonscopy- "slow to awaken"   Depression    DJD (degenerative joint disease) of cervical spine    Dysrhythmia  afib   Family history of CABG    GERD (gastroesophageal reflux disease)    History of kidney stones    Hyperglycemia    Hyperlipidemia    Hypertension    Jaw pain 08/31/2015   Pneumonia    PONV (postoperative nausea and vomiting)    Wears glasses     Current Outpatient Medications  Medication Sig Dispense Refill   acetaminophen (TYLENOL) 650 MG CR tablet Take 650 mg by mouth every 8 (eight) hours as needed for pain. For arthritis of hands.     cimetidine (TAGAMET) 200 MG tablet Take 200 mg by mouth every evening.     flecainide (TAMBOCOR) 50 MG tablet TAKE 1 TABLET BY MOUTH TWICE DAILY . APPOINTMENT REQUIRED FOR FUTURE REFILLS 180 tablet 3   fluticasone (FLONASE) 50 MCG/ACT nasal spray Place 2 sprays into both nostrils daily. 48 g 1   glucose blood test strip Use as instructed 100 each 12   Loratadine 10 MG CAPS      metFORMIN (GLUCOPHAGE) 500 MG tablet Take 1 tablet (500 mg total) by mouth daily with breakfast. 90 tablet 1   metoprolol succinate (TOPROL-XL) 25 MG 24 hr tablet Take 1 tablet by mouth once daily 30 tablet 0    rivaroxaban (XARELTO) 20 MG TABS tablet TAKE 1 TABLET BY MOUTH ONCE DAILY WITH SUPPER 90 tablet 1   rosuvastatin (CRESTOR) 20 MG tablet Take 1 tablet (20 mg total) by mouth daily. 90 tablet 0   sertraline (ZOLOFT) 100 MG tablet Take 1 tablet by mouth once daily 90 tablet 0   No current facility-administered medications for this visit.    Allergies:    Allergies  Allergen Reactions   Adhesive [Tape]     Burn skin   Erythromycin     Gi intolerance - nausea, vomiting, and abd pains   Naproxen Nausea And Vomiting    Abdominal pain    Penicillins Hives    Has patient had a PCN reaction causing immediate rash, facial/tongue/throat swelling, SOB or lightheadedness with hypotension: UNKNOWN Has patient had a PCN reaction causing severe rash involving mucus membranes or skin necrosis: No Has patient had a PCN reaction that required hospitalization No Has patient had a PCN reaction occurring within the last 10 years: No If all of the above answers are "NO", then may proceed with Cephalosporin use.     Social History:  The patient  reports that she quit smoking about 18 years ago. Her smoking use included cigarettes. She started smoking about 60 years ago. She has a 20.9 pack-year smoking history. She has never been exposed to tobacco smoke. She has never used smokeless tobacco. She reports that she does not drink alcohol and does not use drugs.   Family history:   Family History  Problem Relation Age of Onset   Stroke Mother    Diabetes Father    Heart disease Father        CABG   Arthritis Father        C6-7   Skin cancer Father        SCC   Arthritis Brother        s/p THR    ROS: A comprehensive review of systems was negative.  HOME MEDS: Current Outpatient Medications  Medication Sig Dispense Refill   acetaminophen (TYLENOL) 650 MG CR tablet Take 650 mg by mouth every 8 (eight) hours as needed for pain. For arthritis of hands.     cimetidine (TAGAMET) 200 MG tablet  Take 200  mg by mouth every evening.     flecainide (TAMBOCOR) 50 MG tablet TAKE 1 TABLET BY MOUTH TWICE DAILY . APPOINTMENT REQUIRED FOR FUTURE REFILLS 180 tablet 3   fluticasone (FLONASE) 50 MCG/ACT nasal spray Place 2 sprays into both nostrils daily. 48 g 1   glucose blood test strip Use as instructed 100 each 12   Loratadine 10 MG CAPS      metFORMIN (GLUCOPHAGE) 500 MG tablet Take 1 tablet (500 mg total) by mouth daily with breakfast. 90 tablet 1   metoprolol succinate (TOPROL-XL) 25 MG 24 hr tablet Take 1 tablet by mouth once daily 30 tablet 0   rivaroxaban (XARELTO) 20 MG TABS tablet TAKE 1 TABLET BY MOUTH ONCE DAILY WITH SUPPER 90 tablet 1   rosuvastatin (CRESTOR) 20 MG tablet Take 1 tablet (20 mg total) by mouth daily. 90 tablet 0   sertraline (ZOLOFT) 100 MG tablet Take 1 tablet by mouth once daily 90 tablet 0   No current facility-administered medications for this visit.    LABS/IMAGING: No results found for this or any previous visit (from the past 48 hour(s)). No results found.  VITALS: BP 108/66 (BP Location: Left Arm, Patient Position: Sitting, Cuff Size: Normal)   Pulse (!) 51   Ht 5\' 3"  (1.6 m)   Wt 153 lb 9.6 oz (69.7 kg)   SpO2 96%   BMI 27.21 kg/m   EXAM: General appearance: alert and no distress Neck: no carotid bruit and no JVD Lungs: clear to auscultation bilaterally Heart: regular rate and rhythm, S1, S2 normal, no murmur, click, rub or gallop Abdomen: soft, non-tender; bowel sounds normal; no masses,  no organomegaly Extremities: extremities normal, atraumatic, no cyanosis or edema Pulses: 2+ and symmetric Skin: Skin color, texture, turgor normal. No rashes or lesions Neurologic: Grossly normal Psych: Mood, affect normal  EKG: EKG Interpretation Date/Time:  Tuesday September 28 2023 11:06:08 EDT Ventricular Rate:  51 PR Interval:  206 QRS Duration:  80 QT Interval:  440 QTC Calculation: 405 R Axis:   65  Text Interpretation: Sinus bradycardia Nonspecific  ST abnormality When compared with ECG of 08-Oct-2015 15:25, No significant change was found Confirmed by Zoila Shutter [phone]) on 09/28/2023 11:19:47 AM    ASSESSMENT: Paroxysmal atrial fibrillation - CHADSVASC score of 1 (female >65) on Xarelto Long-term flecainide therapy Dyslipidemia- at goal Aortic atherosclerosis  PLAN: 1.   Mrs. Renteria remains asymptomatic without any recurrent A-fib that she is aware of.  She is on flecainide.  Her EKG shows sinus bradycardia with normal QRS duration and QT intervals.  She had stress testing in 2022 which was negative for ischemia.  Her cholesterol has been well treated and is at goal.  She does have some aortic atherosclerosis.  No changes to her medicines at this time.  Follow-up with me annually or sooner as necessary.  Chrystie Nose, MD, [facility], FACP  [place]  [facility]  Medical Director of the Advanced Lipid Disorders &  Cardiovascular Risk Reduction Clinic Diplomate of the American Board of Clinical Lipidology Attending Cardiologist  Direct Dial: [phone]  Fax: [phone]  Website:  [email or web address] Nicely Joseangel Nettleton 09/28/2023, 11:19 AM

## 2023-09-28 NOTE — Patient Instructions (Signed)
Medication Instructions:  No changes  *If you need a refill on your cardiac medications before your next appointment, please call your pharmacy*   Lab Work: Not needed    Testing/Procedures: Not needed   Follow-Up: At [facility], you and your health needs are our priority.  As part of our continuing mission to provide you with exceptional heart care, we have created designated Provider Care Teams.  These Care Teams include your primary Cardiologist (physician) and Advanced Practice Providers (APPs -  Physician Assistants and Nurse Practitioners) who all work together to provide you with the care you need, when you need it.     Your next appointment:   12 month(s)  The format for your next appointment:   In Person  Provider:   Chrystie Nose, MD

## 2023-10-04 ENCOUNTER — Other Ambulatory Visit: Payer: Self-pay | Admitting: Family Medicine

## 2023-10-04 ENCOUNTER — Other Ambulatory Visit: Payer: Self-pay | Admitting: Internal Medicine

## 2023-10-04 DIAGNOSIS — I480 Paroxysmal atrial fibrillation: Secondary | ICD-10-CM

## 2023-10-04 DIAGNOSIS — F419 Anxiety disorder, unspecified: Secondary | ICD-10-CM

## 2023-10-04 DIAGNOSIS — F39 Unspecified mood [affective] disorder: Secondary | ICD-10-CM

## 2023-10-04 NOTE — Telephone Encounter (Signed)
Prescription refill request for Xarelto received.  Indication: Afib  Last office visit: 09/28/23 (hilty)  Weight: 69.7kg Age: 78 Scr: 0.98 (08/09/23)  CrCl: 54.2mlmin  Appropriate dose. Refill sent.

## 2023-10-12 ENCOUNTER — Other Ambulatory Visit: Payer: Self-pay

## 2023-11-01 ENCOUNTER — Encounter: Payer: Self-pay | Admitting: Family Medicine

## 2023-11-08 ENCOUNTER — Ambulatory Visit
Admission: EM | Admit: 2023-11-08 | Discharge: 2023-11-08 | Disposition: A | Discharge status: Home / Self Care | Payer: PPO | Attending: Family Medicine | Admitting: Family Medicine

## 2023-11-08 ENCOUNTER — Encounter: Payer: Self-pay | Admitting: Family Medicine

## 2023-11-08 ENCOUNTER — Telehealth: Payer: Self-pay

## 2023-11-08 DIAGNOSIS — J0100 Acute maxillary sinusitis, unspecified: Secondary | ICD-10-CM | POA: Diagnosis not present

## 2023-11-08 MED ORDER — AZITHROMYCIN 250 MG PO TABS: ORAL_TABLET | ORAL | 0 refills | Status: DC

## 2023-11-08 NOTE — Discharge Instructions (Signed)
Continue Flonase.  Hold sinus rinses while completing treatment for sinus infection. Hydrate well with fluids. Return as needed

## 2023-11-08 NOTE — Telephone Encounter (Signed)
4-5 weeks of coughing and sinus drainage. Patient states she has tried OTC allergy, saline rinse, tylenol sinus, coricidin. Patient states that symptoms seem to resolve and come back. Currently reports nasal congestion, runny nose, cough (non productive).

## 2023-11-08 NOTE — Telephone Encounter (Signed)
Copied from CRM [id number]. Topic: Clinical - Medical Advice >> Nov 08, 2023 10:31 AM Almira Coaster wrote: Reason for CRM: Patient has been dealing with a cough and congestion for about a month now, has tried different over the counter medications but they don't seem to work. Next available appointment is not until Dec 27 which is too far out. Patient would like to speak with someone from the office if possible.

## 2023-11-08 NOTE — Telephone Encounter (Signed)
Patient has agreed with advisement. All questions and concerns have been addressed.

## 2023-11-08 NOTE — Telephone Encounter (Signed)
Since we do not have immediate availability, recommend evaluation at urgent care to determine appropriate treatment plan

## 2023-11-08 NOTE — ED Triage Notes (Signed)
Patient present with nasal congestion x 4/5 weeks. Treated with Coricidin, Tylenol sinus and allergy medicine without relief.

## 2023-11-08 NOTE — ED Provider Notes (Signed)
[facility]    CSN: [id number] Arrival date & time: 11/08/23  1301      History   Chief Complaint Chief Complaint  Patient presents with   URI    HPI Daisy Nelson is a 77 y.o. female.   HPI Patient here today for evaluation of nasal congestion, nasal drainage, and cough over the course of 4 to 5 weeks.  Reports she typically gets a sinus infection at least twice per year.  She has treated symptoms with over-the-counter Coricidin, Tylenol sinus and allergy medication without relief. She endorses facial pressure but denies headache or dizziness.  Not had any shortness of breath or chest tightness.  Past Medical History:  Diagnosis Date   AF (atrial fibrillation) (HCC)    AF with RVR 01/2013   Allergy    Anxiety    Atrial fibrillation with RVR (HCC) 08/31/2015   Colon polyps    Complication of anesthesia 2011   colonscopy- "slow to awaken"   Depression    DJD (degenerative joint disease) of cervical spine    Dysrhythmia    afib   Family history of CABG    GERD (gastroesophageal reflux disease)    History of kidney stones    Hyperglycemia    Hyperlipidemia    Hypertension    Jaw pain 08/31/2015   Pneumonia    PONV (postoperative nausea and vomiting)    Wears glasses     Patient Active Problem List   Diagnosis Date Noted   History of colonic polyps 09/02/2023   Vitamin D deficiency 12/14/2019   Hypertension associated with diabetes (HCC) 06/12/2019   Environmental and seasonal allergies 10/24/2018   hx tob abuse;  30 yrs at roughly 1/2 ppd 05/11/2018   Drug-induced bradycardia-  rate controlled on metoprolol by cards, patient ASx 05/10/2018   Mood disorder (HCC) 05/10/2018   Preretinal fibrosis, right eye 08/25/2016   Type 2 diabetes mellitus (HCC) 03/26/2015   PAF (paroxysmal atrial fibrillation) (HCC) 07/10/2013   Atrial fibrillation with rapid ventricular response (HCC) 02/06/2013   GERD (gastroesophageal reflux disease) 10/25/2012    Allergic rhinitis 10/25/2012   Hyperlipidemia associated with type 2 diabetes mellitus (HCC) 10/25/2012   Anxiety 10/25/2012   DJD (degenerative joint disease) of cervical spine 10/25/2012   Colon polyps 10/25/2012   Complication of anesthesia 11/30/2009    Past Surgical History:  Procedure Laterality Date   25 GAUGE PARS PLANA VITRECTOMY WITH 20 GAUGE MVR PORT FOR MACULAR HOLE Right 08/25/2016   Procedure: 25 GAUGE PARS PLANA VITRECTOMY; RIGHT EYE;  Surgeon: Sherrie George, MD;  Location: [facility] OR;  Service: Ophthalmology;  Laterality: Right;   ABDOMINAL HYSTERECTOMY  1991   CATARACT EXTRACTION W/ INTRAOCULAR LENS  IMPLANT, BILATERAL Bilateral 03/2013   CESAREAN SECTION  1976   COLONOSCOPY  2011   polyps   COLONOSCOPY WITH PROPOFOL N/A 08/19/2020   Procedure: COLONOSCOPY WITH PROPOFOL;  Surgeon: Sherrilyn Rist, MD;  Location: [facility];  Service: Gastroenterology;  Laterality: N/A;   EYE SURGERY     GANGLION CYST EXCISION Left    hand   GAS/FLUID EXCHANGE Right 08/25/2016   Procedure: GAS/FLUID EXCHANGE RIGHT EYE;  Surgeon: Sherrie George, MD;  Location: [facility] OR;  Service: Ophthalmology;  Laterality: Right;   IRIDECTOMY Right 08/25/2016   Procedure: PERIPHERAL IRIDECTOMY RIGHT EYE;  Surgeon: Sherrie George, MD;  Location: [facility] OR;  Service: Ophthalmology;  Laterality: Right;   LAPAROSCOPY  1976   MEMBRANE PEEL Right 08/25/2016  Procedure: MEMBRANE PEEL RIGHT EYE;  Surgeon: Sherrie George, MD;  Location: [facility] OR;  Service: Ophthalmology;  Laterality: Right;   NM MYOCAR PERF WALL MOTION  01/2013   lexiscan - no pharmacologically induced ischemia, fixed defects in apical segments of anterior septum & inferior lateral wall    PARS PLANA VITRECTOMY Right 08/25/2016   laser, gas injection, AC wash out. Membrane peel right eye   POLYPECTOMY N/A 08/19/2020   Procedure: POLYPECTOMY;  Surgeon: Sherrilyn Rist, MD;  Location: [facility];  Service: Gastroenterology;  Laterality: N/A;    TRANSTHORACIC ECHOCARDIOGRAM  01/2013   EF 55-60%, mod conc LVH; mild MR with mildly thickened leaflets; mild TR; PA peak pressure   TUBAL LIGATION  1977    OB History   No obstetric history on file.      Home Medications    Prior to Admission medications   Medication Sig Start Date End Date Taking? Authorizing Provider  azithromycin (ZITHROMAX) 250 MG tablet Take 2 tabs PO x 1 dose, then 1 tab PO QD x 4 days 11/08/23  Yes Bing Neighbors, NP  acetaminophen (TYLENOL) 650 MG CR tablet Take 650 mg by mouth every 8 (eight) hours as needed for pain. For arthritis of hands.    [provider]  cimetidine (TAGAMET) 200 MG tablet Take 200 mg by mouth every evening.    [provider]  flecainide (TAMBOCOR) 50 MG tablet TAKE 1 TABLET BY MOUTH TWICE DAILY . APPOINTMENT REQUIRED FOR FUTURE REFILLS 07/05/23   Chrystie Nose, MD  fluticasone [facility]) 50 MCG/ACT nasal spray Place 2 sprays into both nostrils daily. 05/12/23   Saralyn Pilar A, PA  glucose blood test strip Use as instructed 05/04/22   Mayer Masker, PA-C  Loratadine 10 MG CAPS     [provider]  metFORMIN (GLUCOPHAGE) 500 MG tablet Take 1 tablet (500 mg total) by mouth daily with breakfast. 08/23/23   Melida Quitter, PA  metoprolol succinate (TOPROL-XL) 25 MG 24 hr tablet Take 1 tablet by mouth once daily 10/05/23   Hilty, Lisette Abu, MD  rivaroxaban (XARELTO) 20 MG TABS tablet TAKE 1 TABLET BY MOUTH ONCE DAILY WITH SUPPER 10/04/23   Hilty, Lisette Abu, MD  rosuvastatin (CRESTOR) 20 MG tablet Take 1 tablet (20 mg total) by mouth daily. 08/23/23   Melida Quitter, PA  sertraline (ZOLOFT) 100 MG tablet Take 1 tablet by mouth once daily 10/04/23   Melida Quitter, PA    Family History Family History  Problem Relation Age of Onset   Stroke Mother    Diabetes Father    Heart disease Father        CABG   Arthritis Father        C6-7   Skin cancer Father        SCC   Arthritis Brother         s/p THR    Social History Social History   Tobacco Use   Smoking status: Former    Current packs/day: 0.00    Average packs/day: 0.5 packs/day for 41.8 years (20.9 ttl pk-yrs)    Types: Cigarettes    Start date: 03/15/1963    Quit date: 01/08/2005    Years since quitting: 18.8    Passive exposure: Never   Smokeless tobacco: Never  Vaping Use   Vaping status: Never Used  Substance Use Topics   Alcohol use: No   Drug use: No     Allergies  Adhesive [tape], Erythromycin, Naproxen, and Penicillins   Review of Systems Review of Systems Pertinent negatives listed in HPI   Physical Exam Triage Vital Signs ED Triage Vitals  Encounter Vitals Group     BP 11/08/23 1441 (!) 151/79     Systolic BP Percentile --      Diastolic BP Percentile --      Pulse Rate 11/08/23 1441 60     Resp 11/08/23 1441 16     Temp 11/08/23 1439 97.9 F (36.6 C)     Temp Source 11/08/23 1441 Oral     SpO2 11/08/23 1441 97 %     Weight 11/08/23 1438 148 lb (67.1 kg)     Height 11/08/23 1438 5\' 5"  (1.651 m)     Head Circumference --      Peak Flow --      Pain Score 11/08/23 1438 0     Pain Loc --      Pain Education --      Exclude from Growth Chart --    No data found.  Updated Vital Signs BP (!) 151/79 (BP Location: Left Arm)   Pulse 60   Temp 97.9 F (36.6 C) (Oral)   Resp 16   Ht 5\' 5"  (1.651 m)   Wt 148 lb (67.1 kg)   SpO2 97%   BMI 24.63 kg/m   Visual Acuity Right Eye Distance:   Left Eye Distance:   Bilateral Distance:    Right Eye Near:   Left Eye Near:    Bilateral Near:     Physical Exam  General Appearance:    Alert, cooperative, no distress  HENT:   Normocephalic, ears normal, nares mucosal edema with congestion, rhinorrhea, oropharynx  clear  Eyes:    PERRL, conjunctiva/corneas clear, EOM's intact       Lungs:     Clear to auscultation bilaterally, respirations unlabored  Heart:    Regular rate and rhythm  Neurologic:   Awake, alert, oriented x 3. No  apparent focal neurological           defect.     UC Treatments / Results  Labs (all labs ordered are listed, but only abnormal results are displayed) Labs Reviewed - No data to display  EKG   Radiology No results found.  Procedures Procedures (including critical care time)  Medications Ordered in UC Medications - No data to display  Initial Impression / Assessment and Plan / UC Course  I have reviewed the triage vital signs and the nursing notes.  Pertinent labs & imaging results that were available during my care of the patient were reviewed by me and considered in my medical decision making (see chart for details).    Acute nonrecurrent maxillary sinusitis, empiric treatment with azithromycin as patient has a penicillin allergy.  Patient will continue Flonase until symptoms resolve.  Strict return precautions given if symptoms worsen or do not improve with treatment. Final Clinical Impressions(s) / UC Diagnoses   Final diagnoses:  Acute non-recurrent maxillary sinusitis     Discharge Instructions      Continue Flonase.  Hold sinus rinses while completing treatment for sinus infection. Hydrate well with fluids. Return as needed     ED Prescriptions     Medication Sig Dispense Auth. Provider   azithromycin (ZITHROMAX) 250 MG tablet Take 2 tabs PO x 1 dose, then 1 tab PO QD x 4 days 6 tablet Bing Neighbors, NP      PDMP not reviewed  this encounter.   Bing Neighbors, NP 11/08/23 [phone]

## 2023-11-25 ENCOUNTER — Other Ambulatory Visit: Payer: Self-pay | Admitting: Family Medicine

## 2023-11-25 DIAGNOSIS — E782 Mixed hyperlipidemia: Secondary | ICD-10-CM

## 2023-12-23 ENCOUNTER — Ambulatory Visit: Payer: PPO | Admitting: Family Medicine

## 2023-12-23 VITALS — BP 132/73 | HR 53 | Ht 65.0 in | Wt 153.8 lb

## 2023-12-23 DIAGNOSIS — I480 Paroxysmal atrial fibrillation: Secondary | ICD-10-CM | POA: Diagnosis not present

## 2023-12-23 DIAGNOSIS — E119 Type 2 diabetes mellitus without complications: Secondary | ICD-10-CM

## 2023-12-23 DIAGNOSIS — E1159 Type 2 diabetes mellitus with other circulatory complications: Secondary | ICD-10-CM | POA: Diagnosis not present

## 2023-12-23 DIAGNOSIS — I152 Hypertension secondary to endocrine disorders: Secondary | ICD-10-CM

## 2023-12-23 LAB — POCT GLYCOSYLATED HEMOGLOBIN (HGB A1C)
HbA1c POC (<> result, manual entry): 6 % (ref 4.0–5.6)
HbA1c, POC (controlled diabetic range): 6 % (ref 0.0–7.0)
Hemoglobin A1C: 6 % — AB (ref 4.0–5.6)

## 2023-12-23 MED ORDER — METFORMIN HCL 500 MG PO TABS
500.0000 mg | ORAL_TABLET | Freq: Every day | ORAL | 1 refills | Status: DC
Start: 2023-12-23 — End: 2024-10-23

## 2023-12-23 NOTE — Progress Notes (Signed)
Established Patient Office Visit  Subjective   Patient ID: Daisy Nelson, female    DOB: 1946/09/26  Age: 78 y.o. MRN: [id number]  Chief Complaint  Patient presents with   Medical Management of Chronic Issues    HPI Daisy Nelson is a 78 y.o. female presenting today for follow up of hypertension, diabetes. Hypertension: Patient here for follow-up of elevated blood pressure. She is exercising and is adherent to low salt diet.   Pt denies chest pain, SOB, dizziness, edema, syncope, fatigue or heart palpitations. Taking metoprolol, reports excellent compliance with treatment. Denies side effects.  She is also followed by cardiology with her most recent appointment October 2024, at that time they recommended continuing her medications as prescribed. Diabetes: denies hypoglycemic events, wounds or sores that are not healing well, increased thirst or urination. Denies vision problems, eye exam up-to-date. Taking metformin 500 mg daily as prescribed without any side effects. Has been following low-carb diet.   Outpatient Medications Prior to Visit  Medication Sig   acetaminophen (TYLENOL) 650 MG CR tablet Take 650 mg by mouth every 8 (eight) hours as needed for pain. For arthritis of hands.   cimetidine (TAGAMET) 200 MG tablet Take 200 mg by mouth every evening.   flecainide (TAMBOCOR) 50 MG tablet TAKE 1 TABLET BY MOUTH TWICE DAILY . APPOINTMENT REQUIRED FOR FUTURE REFILLS   fluticasone (FLONASE) 50 MCG/ACT nasal spray Place 2 sprays into both nostrils daily.   glucose blood test strip Use as instructed   Loratadine 10 MG CAPS    metoprolol succinate (TOPROL-XL) 25 MG 24 hr tablet Take 1 tablet by mouth once daily   rivaroxaban (XARELTO) 20 MG TABS tablet TAKE 1 TABLET BY MOUTH ONCE DAILY WITH SUPPER   rosuvastatin (CRESTOR) 20 MG tablet Take 1 tablet by mouth once daily   sertraline (ZOLOFT) 100 MG tablet Take 1 tablet by mouth once daily   [DISCONTINUED] metFORMIN (GLUCOPHAGE) 500  MG tablet Take 1 tablet (500 mg total) by mouth daily with breakfast.   [DISCONTINUED] azithromycin (ZITHROMAX) 250 MG tablet Take 2 tabs PO x 1 dose, then 1 tab PO QD x 4 days   No facility-administered medications prior to visit.    ROS Negative unless otherwise noted in HPI   Objective:     BP 132/73   Pulse (!) 53   Ht 5\' 5"  (1.651 m)   Wt 153 lb 12.8 oz (69.8 kg)   BMI 25.59 kg/m   Physical Exam Constitutional:      General: She is not in acute distress.    Appearance: Normal appearance.  HENT:     Head: Normocephalic and atraumatic.  Cardiovascular:     Rate and Rhythm: Normal rate and regular rhythm.     Heart sounds: No murmur heard.    No friction rub. No gallop.  Pulmonary:     Effort: Pulmonary effort is normal. No respiratory distress.     Breath sounds: No wheezing, rhonchi or rales.  Skin:    General: Skin is warm and dry.  Neurological:     Mental Status: She is alert and oriented to person, place, and time.    Diabetic Foot Exam - Simple   Simple Foot Form Diabetic Foot exam was performed with the following findings: Yes 12/23/2023  1:26 PM  Visual Inspection No deformities, no ulcerations, no other skin breakdown bilaterally: Yes Sensation Testing Intact to touch and monofilament testing bilaterally: Yes Pulse Check Posterior Tibialis and Dorsalis pulse intact bilaterally:  Yes Comments    Results for orders placed or performed in visit on 12/23/23  POCT glycosylated hemoglobin (Hb A1C)  Result Value Ref Range   Hemoglobin A1C 6.0 (A) 4.0 - 5.6 %   HbA1c POC (<> result, manual entry) 6.0 4.0 - 5.6 %   HbA1c, POC (prediabetic range)     HbA1c, POC (controlled diabetic range) 6.0 0.0 - 7.0 %     Assessment & Plan:  Hypertension associated with diabetes (HCC) Assessment & Plan: BP goal <130/80.  Stable, essentially at goal.  Continue metoprolol succinate 25 mg daily.  Heart rate is under 60 but is stable.  Will continue to monitor.   Type 2  diabetes mellitus without complication, without long-term current use of insulin (HCC) Assessment & Plan: A1c at last appointment was 6.5, on repeat today.  Continue low-carb/sugar nutrition plan and staying active.  Continue metformin 500 mg once daily.  Will continue to monitor.  Orders: -     metFORMIN HCl; Take 1 tablet (500 mg total) by mouth daily with breakfast.  Dispense: 90 tablet; Refill: 1 -     POCT glycosylated hemoglobin (Hb A1C)  PAF (paroxysmal atrial fibrillation) (HCC) Assessment & Plan: Stable, followed by cardiology.  Continue metoprolol succinate 25 mg daily as recommended by cardiology.     Return in about 4 months (around 04/21/2024) for follow-up for HTN, HLD, DM, mood, fasting labs 1 week before.  If A1c remains stable, extend follow-ups to every 6 months.   Melida Quitter, PA

## 2023-12-23 NOTE — Assessment & Plan Note (Signed)
A1c at last appointment was 6.5, on repeat today.  Continue low-carb/sugar nutrition plan and staying active.  Continue metformin 500 mg once daily.  Will continue to monitor.

## 2023-12-23 NOTE — Assessment & Plan Note (Addendum)
BP goal <130/80.  Stable, essentially at goal.  Continue metoprolol succinate 25 mg daily.  Heart rate is under 60 but is stable.  Will continue to monitor.

## 2023-12-23 NOTE — Assessment & Plan Note (Signed)
Stable, followed by cardiology.  Continue metoprolol succinate 25 mg daily as recommended by cardiology.

## 2023-12-23 NOTE — Patient Instructions (Signed)
Keep up the FANTASTIC work!!!

## 2024-01-04 ENCOUNTER — Other Ambulatory Visit: Payer: Self-pay | Admitting: Family Medicine

## 2024-01-04 DIAGNOSIS — F39 Unspecified mood [affective] disorder: Secondary | ICD-10-CM

## 2024-01-04 DIAGNOSIS — F419 Anxiety disorder, unspecified: Secondary | ICD-10-CM

## 2024-01-06 ENCOUNTER — Encounter: Payer: Self-pay | Admitting: Family Medicine

## 2024-02-22 ENCOUNTER — Other Ambulatory Visit: Payer: Self-pay | Admitting: Family Medicine

## 2024-02-22 DIAGNOSIS — E782 Mixed hyperlipidemia: Secondary | ICD-10-CM

## 2024-03-10 ENCOUNTER — Telehealth: Payer: Self-pay | Admitting: *Deleted

## 2024-03-10 NOTE — Telephone Encounter (Signed)
 Patient called back and was able to get rescheduled for early June with Dr Constance Goltz.

## 2024-03-10 NOTE — Telephone Encounter (Signed)
 LVM for pt to call office to reschedule her appointment on 04/21/24 due to the provider not being in the office that day.  Please reschedule pt if she calls back.

## 2024-03-13 ENCOUNTER — Other Ambulatory Visit: Payer: Self-pay | Admitting: Family Medicine

## 2024-03-13 DIAGNOSIS — J300 Vasomotor rhinitis: Secondary | ICD-10-CM

## 2024-04-02 ENCOUNTER — Other Ambulatory Visit: Payer: Self-pay | Admitting: Internal Medicine

## 2024-04-02 ENCOUNTER — Other Ambulatory Visit: Payer: Self-pay | Admitting: Family Medicine

## 2024-04-02 DIAGNOSIS — F39 Unspecified mood [affective] disorder: Secondary | ICD-10-CM

## 2024-04-02 DIAGNOSIS — I480 Paroxysmal atrial fibrillation: Secondary | ICD-10-CM

## 2024-04-02 DIAGNOSIS — F419 Anxiety disorder, unspecified: Secondary | ICD-10-CM

## 2024-04-03 NOTE — Telephone Encounter (Signed)
 Prescription refill request for Xarelto  received.  Indication:AFIB Last office visit:10/24 Weight:69.8  kg Age:78 Scr:0.98  9/24 CrCl:53.81  ml/min  Prescription refilled

## 2024-04-05 ENCOUNTER — Other Ambulatory Visit: Payer: Self-pay | Admitting: *Deleted

## 2024-04-05 DIAGNOSIS — E559 Vitamin D deficiency, unspecified: Secondary | ICD-10-CM

## 2024-04-05 DIAGNOSIS — E1159 Type 2 diabetes mellitus with other circulatory complications: Secondary | ICD-10-CM

## 2024-04-05 DIAGNOSIS — E119 Type 2 diabetes mellitus without complications: Secondary | ICD-10-CM

## 2024-04-05 DIAGNOSIS — E1169 Type 2 diabetes mellitus with other specified complication: Secondary | ICD-10-CM

## 2024-04-06 ENCOUNTER — Encounter (HOSPITAL_COMMUNITY): Payer: Self-pay

## 2024-04-06 ENCOUNTER — Emergency Department (HOSPITAL_COMMUNITY)
Admission: EM | Admit: 2024-04-06 | Discharge: 2024-04-06 | Disposition: A | Discharge status: Home / Self Care | Attending: Emergency Medicine | Admitting: Emergency Medicine

## 2024-04-06 ENCOUNTER — Telehealth: Payer: Self-pay | Admitting: Family Medicine

## 2024-04-06 DIAGNOSIS — Z79899 Other long term (current) drug therapy: Secondary | ICD-10-CM | POA: Diagnosis not present

## 2024-04-06 DIAGNOSIS — I10 Essential (primary) hypertension: Secondary | ICD-10-CM | POA: Diagnosis not present

## 2024-04-06 DIAGNOSIS — Z7901 Long term (current) use of anticoagulants: Secondary | ICD-10-CM | POA: Insufficient documentation

## 2024-04-06 DIAGNOSIS — R42 Dizziness and giddiness: Secondary | ICD-10-CM | POA: Insufficient documentation

## 2024-04-06 DIAGNOSIS — Z87442 Personal history of urinary calculi: Secondary | ICD-10-CM | POA: Insufficient documentation

## 2024-04-06 LAB — I-STAT CHEM 8, ED
BUN: 13 mg/dL (ref 8–23)
Calcium, Ion: 1.2 mmol/L (ref 1.15–1.40)
Chloride: 104 mmol/L (ref 98–111)
Creatinine, Ser: 0.9 mg/dL (ref 0.44–1.00)
Glucose, Bld: 140 mg/dL — ABNORMAL HIGH (ref 70–99)
HCT: 35 % — ABNORMAL LOW (ref 36.0–46.0)
Hemoglobin: 11.9 g/dL — ABNORMAL LOW (ref 12.0–15.0)
Potassium: 4.1 mmol/L (ref 3.5–5.1)
Sodium: 139 mmol/L (ref 135–145)
TCO2: 22 mmol/L (ref 22–32)

## 2024-04-06 MED ORDER — MECLIZINE HCL 25 MG PO TABS: 25.0000 mg | ORAL_TABLET | Freq: Two times a day (BID) | ORAL | 0 refills | Status: AC | PRN

## 2024-04-06 MED ORDER — ONDANSETRON HCL 4 MG/2ML IJ SOLN
4.0000 mg | Freq: Once | INTRAMUSCULAR | Status: AC
  Administered 2024-04-06: 4 mg via INTRAVENOUS
  Filled 2024-04-06: qty 2

## 2024-04-06 MED ORDER — MECLIZINE HCL 25 MG PO TABS
25.0000 mg | ORAL_TABLET | Freq: Once | ORAL | Status: AC
  Administered 2024-04-06: 25 mg via ORAL
  Filled 2024-04-06: qty 1

## 2024-04-06 NOTE — Telephone Encounter (Signed)
 No it looks like all the labs we were getting were collected already

## 2024-04-06 NOTE — Telephone Encounter (Signed)
 Copied from CRM [id number]. Topic: Clinical - Lab/Test Results >> Apr 06, 2024  9:46 AM Daisy Nelson wrote: Reason for CRM: Patient has lab appt tomorrow patient went to the ER and they did lab work last night patient wants to know do she still need to come tomorrow for her lab appt please follow up with patient

## 2024-04-06 NOTE — Telephone Encounter (Signed)
 Sorry about that. I thought I was routing it back to the person from the call center. Thank you for letting the patient know.

## 2024-04-06 NOTE — ED Provider Notes (Signed)
 [place] EMERGENCY DEPARTMENT AT [facility] Provider Note   CSN: [id number] Arrival date & time: 04/06/24  4098     History  Chief Complaint  Patient presents with   Dizziness    Pt arrived by ems d/t dizziness when standing up from bed to go to bathroom. Per pt, pt stated that she did not feel as if she was going to pass out, it was just the dizziness. Per ems, pt's dizziness has lessened but is still present and gets worse when pt moves.    Daisy Nelson is a 78 y.o. female.  The history is provided by the patient.  Dizziness Quality:  Room spinning Severity:  Moderate Onset quality:  Sudden Progression:  Improving Chronicity:  New Worsened by:  Standing up Associated symptoms: no chest pain, no headaches, no hearing loss, no shortness of breath, no tinnitus, no vomiting and no weakness   Patient with history of rate controlled atrial fibrillation on Xarelto , hypertension presents with dizziness.  Patient reports she went to bed in her usual state of health.  She reports she woke up around 1:30 AM and when she sat up on the side of bed she felt that things were spinning.  No fevers or vomiting.  No chest pain or shortness of breath.  No focal weakness.  No slurred speech.  No visual changes.  She has never had this before.  No recent travel or trauma.  No new medications.  She has been compliant with Xarelto  for years.  No recent missed doses.  No previous history of CVA    Past Medical History:  Diagnosis Date   AF (atrial fibrillation) (HCC)    AF with RVR 01/2013   Allergy    Anxiety    Atrial fibrillation with RVR (HCC) 08/31/2015   Colon polyps    Complication of anesthesia 2011   colonscopy- "slow to awaken"   Depression    DJD (degenerative joint disease) of cervical spine    Dysrhythmia    afib   Family history of CABG    GERD (gastroesophageal reflux disease)    History of kidney stones    Hyperglycemia    Hyperlipidemia    Hypertension     Jaw pain 08/31/2015   Pneumonia    PONV (postoperative nausea and vomiting)    Wears glasses     Home Medications Prior to Admission medications   Medication Sig Start Date End Date Taking? Authorizing Provider  meclizine (ANTIVERT) 25 MG tablet Take 1 tablet (25 mg total) by mouth 2 (two) times daily as needed for dizziness. 04/06/24  Yes Eldon Greenland, MD  acetaminophen  (TYLENOL ) 650 MG CR tablet Take 650 mg by mouth every 8 (eight) hours as needed for pain. For arthritis of hands.    [provider]  cimetidine (TAGAMET) 200 MG tablet Take 200 mg by mouth every evening.    [provider]  flecainide  (TAMBOCOR ) 50 MG tablet TAKE 1 TABLET BY MOUTH TWICE DAILY . APPOINTMENT REQUIRED FOR FUTURE REFILLS 07/05/23   Hazle Lites, MD  fluticasone  (FLONASE ) 50 MCG/ACT nasal spray Use 2 spray(s) in each nostril once daily 03/13/24   Laneta Pintos, MD  glucose blood test strip Use as instructed 05/04/22   Abonza, Maritza, PA-C  Loratadine 10 MG CAPS     [provider]  metFORMIN  (GLUCOPHAGE ) 500 MG tablet Take 1 tablet (500 mg total) by mouth daily with breakfast. 12/23/23   Noreene Bearded, PA  metoprolol  succinate (TOPROL -XL) 25 MG 24 hr tablet Take 1 tablet by mouth once daily 10/05/23   Hilty, Aviva Lemmings, MD  rivaroxaban  (XARELTO ) 20 MG TABS tablet TAKE 1 TABLET BY MOUTH ONCE DAILY WITH SUPPER 04/03/24   Hilty, Aviva Lemmings, MD  rosuvastatin  (CRESTOR ) 20 MG tablet Take 1 tablet by mouth once daily 02/22/24   Olson, Daniel K, MD  sertraline  (ZOLOFT ) 100 MG tablet Take 1 tablet by mouth once daily 04/03/24   Laneta Pintos, MD      Allergies    Adhesive [tape], Erythromycin, Naproxen, and Penicillins    Review of Systems   Review of Systems  HENT:  Negative for hearing loss and tinnitus.   Eyes:  Negative for visual disturbance.  Respiratory:  Negative for shortness of breath.   Cardiovascular:  Negative for chest pain.  Gastrointestinal:  Negative for vomiting.   Neurological:  Positive for dizziness. Negative for syncope, speech difficulty, weakness, numbness and headaches.    Physical Exam Updated Vital Signs BP (!) 170/67 (BP Location: Right Arm)   Pulse (!) 53   Temp 98.4 F (36.9 C) (Oral)   Ht 1.651 m (5\' 5" )   Wt 68 kg   SpO2 100%   BMI 24.96 kg/m  Physical Exam CONSTITUTIONAL: Well developed/well nourished HEAD: Normocephalic/atraumatic EYES: EOMI/PERRL, no nystagmus, no visual field deficit  no ptosis ENMT: Mucous membranes moist NECK: supple no meningeal signs, no bruits CV: S1/S2 noted, no murmurs/rubs/gallops noted LUNGS: Lungs are clear to auscultation bilaterally, no apparent distress ABDOMEN: soft, nontender, no rebound or guarding GU:no cva tenderness NEURO:Awake/alert, face symmetric, no arm or leg drift is noted Equal 5/5 strength with shoulder abduction, elbow flex/extension, wrist flex/extension in upper extremities and equal hand grips bilaterally Equal 5/5 strength with hip flexion,knee flex/extension, foot dorsi/plantar flexion Cranial nerves 3/4/5/6/06/07/09/11/12 tested and intact Gait normal without ataxia No past pointing Sensation to light touch intact in all extremities EXTREMITIES: pulses normal, full ROM SKIN: warm, color normal  ED Results / Procedures / Treatments   Labs (all labs ordered are listed, but only abnormal results are displayed) Labs Reviewed  I-STAT CHEM 8, ED - Abnormal; Notable for the following components:      Result Value   Glucose, Bld 140 (*)    Hemoglobin 11.9 (*)    HCT 35.0 (*)    All other components within normal limits    EKG EKG Interpretation Date/Time:  Thursday Apr 06 2024 04:25:34 EDT Ventricular Rate:  45 PR Interval:  200 QRS Duration:  93 QT Interval:  469 QTC Calculation: 406 R Axis:   51  Text Interpretation: Sinus bradycardia Low voltage, precordial leads Minimal ST depression, diffuse leads Confirmed by Eldon Greenland ([id number]) on 04/06/2024 4:36:54  AM  Radiology No results found.  Procedures Procedures    Medications Ordered in ED Medications  ondansetron  (ZOFRAN ) injection 4 mg (4 mg Intravenous Given 04/06/24 0447)  meclizine (ANTIVERT) tablet 25 mg (25 mg Oral Given 04/06/24 0447)    ED Course/ Medical Decision Making/ A&P Clinical Course as of 04/06/24 0543  Thu Apr 06, 2024  0420 This patient is very well-appearing.  She woke up and sat was out of bed and started having vertiginous type symptoms.  No other acute symptoms.  She is well-appearing.  Will treat symptoms and reassess [DW]  0451 No acute EKG changes.  Patient had bradycardia in the 50s previously This is unlikely the cause of her symptoms [DW]  0542 Patient feels dramatically improved.  She is awake alert walking around without any distress.  I personally ambulated patient in the room and she had no ataxia.  She had no vomiting.  Husband is at bedside who reports that she had no mental status changes and was only reporting dizziness.  There is no focal weakness reported  Given her overall appearance and clinical stability, I have low suspicion for acute neurologic emergency. Patient safe for discharge home, will defer any emergent imaging for now [DW]    Clinical Course User Index [DW] Eldon Greenland, MD                                 Medical Decision Making Amount and/or Complexity of Data Reviewed ECG/medicine tests: ordered.  Risk Prescription drug management.   This patient presents to the ED for concern of dizziness, this involves an extensive number of treatment options, and is a complaint that carries with it a high risk of complications and morbidity.  The differential diagnosis includes but is not limited to CVA, intracranial hemorrhage, acute coronary syndrome, renal failure, urinary tract infection, electrolyte disturbance, pneumonia Cardiac dysrhythmia, peripheral vertigo  Comorbidities that complicate the patient evaluation: Patient's  presentation is complicated by their history of atrial fibrillation  Additional history obtained: Records reviewed cardiology notes reviewed  Lab Tests: I Ordered, and personally interpreted labs.  The pertinent results include: Mild anemia   Cardiac Monitoring: The patient was maintained on a cardiac monitor.  I personally viewed and interpreted the cardiac monitor which showed an underlying rhythm of:  sinus rhythm  Medicines ordered and prescription drug management: I ordered medication including Zofran  and Antivert for vertigo Reevaluation of the patient after these medicines showed that the patient    improved  Test Considered: I considered neuroimaging, but since patient is dramatically improved will defer for now  Reevaluation: After the interventions noted above, I reevaluated the patient and found that they have :improved  Complexity of problems addressed: Patient's presentation is most consistent with  acute presentation with potential threat to life or bodily function  Disposition: After consideration of the diagnostic results and the patient's response to treatment,  I feel that the patent would benefit from discharge  .           Final Clinical Impression(s) / ED Diagnoses Final diagnoses:  Vertigo    Rx / DC Orders ED Discharge Orders          Ordered    meclizine (ANTIVERT) 25 MG tablet  2 times daily PRN        04/06/24 0539              Eldon Greenland, MD 04/06/24 [phone]

## 2024-04-06 NOTE — Discharge Instructions (Signed)
Your exam shows you have had an episode of vertigo, which causes a false sense of movement such as a spinning feeling or walls that seem to move.  Most vertigo is caused by a (usually temporary) problem in the inner ear. Rarely, the back part of the brain can cause vertigo (some mini-strokes/strokes), but it appears to be a low risk cause for you at this time. It is important to follow-up with your doctor however, to see if you need further testing.   Do not drive or participate in potentially dangerous activities requiring balance unless off meds (not drowsy) and the vertigo has resolved. Most of the time benign vertigo is much better after a few days. However, mild unsteadiness may last for up to 3 months in some patients. An MRI scan or other special tests to evaluate your hearing and balance may be needed if the vertigo does not improve or returns in the future.   RETURN IMMEDIATELY IF YOU HAVE ANY OF THE FOLLOWING (call 911): Increasing vertigo, earache, ear drainage, or loss of hearing.  Severe headache, blurred or double vision, or trouble walking.  Fainting or poorly responsive, extreme weakness, chest pain, or palpitations.  Fever, persistent vomiting, or dehydration.  Numbness, tingling, incoordination, or weakness of the limbs.  Change in speech, vision, swallowing, understanding, or other concerns.  

## 2024-04-07 ENCOUNTER — Other Ambulatory Visit

## 2024-04-07 DIAGNOSIS — E1159 Type 2 diabetes mellitus with other circulatory complications: Secondary | ICD-10-CM

## 2024-04-07 DIAGNOSIS — E559 Vitamin D deficiency, unspecified: Secondary | ICD-10-CM

## 2024-04-07 DIAGNOSIS — E1169 Type 2 diabetes mellitus with other specified complication: Secondary | ICD-10-CM

## 2024-04-07 DIAGNOSIS — E119 Type 2 diabetes mellitus without complications: Secondary | ICD-10-CM

## 2024-04-14 ENCOUNTER — Other Ambulatory Visit: Payer: PPO

## 2024-04-18 ENCOUNTER — Other Ambulatory Visit: Payer: Self-pay | Admitting: Family Medicine

## 2024-04-18 DIAGNOSIS — J300 Vasomotor rhinitis: Secondary | ICD-10-CM

## 2024-04-19 ENCOUNTER — Encounter: Payer: Self-pay | Admitting: Family Medicine

## 2024-04-20 ENCOUNTER — Ambulatory Visit (INDEPENDENT_AMBULATORY_CARE_PROVIDER_SITE_OTHER): Admitting: Family Medicine

## 2024-04-20 ENCOUNTER — Other Ambulatory Visit: Payer: Self-pay | Admitting: Family Medicine

## 2024-04-20 DIAGNOSIS — R300 Dysuria: Secondary | ICD-10-CM

## 2024-04-20 LAB — POCT URINALYSIS DIP (MANUAL ENTRY)
Bilirubin, UA: NEGATIVE
Glucose, UA: NEGATIVE mg/dL
Ketones, POC UA: NEGATIVE mg/dL
Nitrite, UA: NEGATIVE
Protein Ur, POC: NEGATIVE mg/dL
Spec Grav, UA: >=1.03 — AB (ref 1.010–1.025)
Urobilinogen, UA: 0.2 U/dL
pH, UA: 6.5 (ref 5.0–8.0)

## 2024-04-20 MED ORDER — SULFAMETHOXAZOLE-TRIMETHOPRIM 800-160 MG PO TABS: 1.0000 | ORAL_TABLET | Freq: Two times a day (BID) | ORAL | 0 refills | Status: AC

## 2024-04-21 ENCOUNTER — Ambulatory Visit: Payer: PPO | Admitting: Family Medicine

## 2024-04-21 NOTE — Progress Notes (Signed)
 Pt in office to provide a urine due to thinking she has a UTI. Provider stated she could come for nurse visit to have this tested.

## 2024-04-23 LAB — URINE CULTURE

## 2024-04-25 NOTE — Progress Notes (Incomplete)
   Established Patient Office Visit  Subjective   Patient ID: Daisy Nelson, female    DOB: [birth year]  Age: 78 y.o. MRN: [id number]  No chief complaint on file.   HPI Daisy Nelson is a 78 y.o. F who presents to the clinic today for hospital follow up and UTI Hospital follow up - Pt was seen in the ED on 04/06/24 after she began experiencing severe dizziness. Workup at the ED was negative. She received Zofran  and Meclizine  while in the ED and her condition improved greatly. Today in the clinic she reports ***  UTI - Pt was seen in clinic on 04/20/24 for dysuria. UA revealed trace blood and trace leukocytes. Cx grew E. Coli. She was prescribed a course of Bactrim  which she has *** days left. She reports that her symptoms are ***    {History (Optional):23778}  ROS Per HPI.    Objective:     There were no vitals taken for this visit. {Vitals History (Optional):23777}  Physical Exam Constitutional:      General: She is not in acute distress.    Appearance: Normal appearance.  Cardiovascular:     Rate and Rhythm: Normal rate and regular rhythm.     Heart sounds: Normal heart sounds. No murmur heard.    No friction rub. No gallop.  Pulmonary:     Effort: Pulmonary effort is normal. No respiratory distress.     Breath sounds: Normal breath sounds.  Abdominal:     Tenderness: There is no abdominal tenderness. There is no right CVA tenderness or left CVA tenderness.  Musculoskeletal:        General: No swelling.  Skin:    General: Skin is warm and dry.  Neurological:     General: No focal deficit present.     Mental Status: She is alert.  Psychiatric:        Mood and Affect: Mood normal.        Behavior: Behavior normal.        Thought Content: Thought content normal.      No results found for any visits on 05/01/24.  {Labs (Optional):23779}  The 10-year ASCVD risk score (Arnett DK, et al., 2019) is: 56.5%    Assessment & Plan:   There are no diagnoses  linked to this encounter.  Assessment and Plan              No follow-ups on file.    Odilia Bennett, PA-C

## 2024-04-26 ENCOUNTER — Other Ambulatory Visit

## 2024-04-27 ENCOUNTER — Ambulatory Visit: Payer: Self-pay | Admitting: Family Medicine

## 2024-04-27 LAB — COMPREHENSIVE METABOLIC PANEL WITH GFR
ALT: 19 IU/L (ref 0–32)
AST: 27 IU/L (ref 0–40)
Albumin: 4.5 g/dL (ref 3.8–4.8)
Alkaline Phosphatase: 98 IU/L (ref 44–121)
BUN/Creatinine Ratio: 11 — ABNORMAL LOW (ref 12–28)
BUN: 11 mg/dL (ref 8–27)
Bilirubin Total: 0.3 mg/dL (ref 0.0–1.2)
CO2: 18 mmol/L — ABNORMAL LOW (ref 20–29)
Calcium: 9.3 mg/dL (ref 8.7–10.3)
Chloride: 101 mmol/L (ref 96–106)
Creatinine, Ser: 1.01 mg/dL — ABNORMAL HIGH (ref 0.57–1.00)
Globulin, Total: 2.4 g/dL (ref 1.5–4.5)
Glucose: 91 mg/dL (ref 70–99)
Potassium: 4.8 mmol/L (ref 3.5–5.2)
Sodium: 135 mmol/L (ref 134–144)
Total Protein: 6.9 g/dL (ref 6.0–8.5)
eGFR: 58 mL/min/{1.73_m2} — ABNORMAL LOW (ref >59)

## 2024-04-27 LAB — CBC WITH DIFFERENTIAL/PLATELET
Basophils Absolute: 0 10*3/uL (ref 0.0–0.2)
Basos: 1 %
EOS (ABSOLUTE): 0.1 10*3/uL (ref 0.0–0.4)
Eos: 2 %
Hematocrit: 36 % (ref 34.0–46.6)
Hemoglobin: 11.6 g/dL (ref 11.1–15.9)
Immature Grans (Abs): 0 10*3/uL (ref 0.0–0.1)
Immature Granulocytes: 1 %
Lymphocytes Absolute: 1 10*3/uL (ref 0.7–3.1)
Lymphs: 22 %
MCH: 28.6 pg (ref 26.6–33.0)
MCHC: 32.2 g/dL (ref 31.5–35.7)
MCV: 89 fL (ref 79–97)
Monocytes Absolute: 0.9 10*3/uL (ref 0.1–0.9)
Monocytes: 18 %
Neutrophils Absolute: 2.7 10*3/uL (ref 1.4–7.0)
Neutrophils: 56 %
Platelets: 163 10*3/uL (ref 150–450)
RBC: 4.05 x10E6/uL (ref 3.77–5.28)
RDW: 14.4 % (ref 11.7–15.4)
WBC: 4.7 10*3/uL (ref 3.4–10.8)

## 2024-04-27 LAB — MICROALBUMIN / CREATININE URINE RATIO
Creatinine, Urine: 57.7 mg/dL
Microalb/Creat Ratio: <5 mg/g{creat} (ref 0–29)
Microalbumin, Urine: <3 ug/mL

## 2024-04-27 LAB — LIPID PANEL
Chol/HDL Ratio: 2.1 ratio (ref 0.0–4.4)
Cholesterol, Total: 121 mg/dL (ref 100–199)
HDL: 58 mg/dL (ref >39)
LDL Chol Calc (NIH): 42 mg/dL (ref 0–99)
Triglycerides: 118 mg/dL (ref 0–149)
VLDL Cholesterol Cal: 21 mg/dL (ref 5–40)

## 2024-04-27 LAB — HEMOGLOBIN A1C
Est. average glucose Bld gHb Est-mCnc: 126 mg/dL
Hgb A1c MFr Bld: 6 % — ABNORMAL HIGH (ref 4.8–5.6)

## 2024-04-27 LAB — VITAMIN D 25 HYDROXY (VIT D DEFICIENCY, FRACTURES): Vit D, 25-Hydroxy: 35.6 ng/mL (ref 30.0–100.0)

## 2024-05-01 ENCOUNTER — Ambulatory Visit (INDEPENDENT_AMBULATORY_CARE_PROVIDER_SITE_OTHER)

## 2024-05-01 ENCOUNTER — Inpatient Hospital Stay: Admitting: Family Medicine

## 2024-05-01 VITALS — BP 110/64 | HR 56 | Ht 65.0 in | Wt 154.0 lb

## 2024-05-01 DIAGNOSIS — E559 Vitamin D deficiency, unspecified: Secondary | ICD-10-CM

## 2024-05-01 DIAGNOSIS — E1159 Type 2 diabetes mellitus with other circulatory complications: Secondary | ICD-10-CM | POA: Diagnosis not present

## 2024-05-01 DIAGNOSIS — E1169 Type 2 diabetes mellitus with other specified complication: Secondary | ICD-10-CM

## 2024-05-01 DIAGNOSIS — F419 Anxiety disorder, unspecified: Secondary | ICD-10-CM

## 2024-05-01 DIAGNOSIS — I152 Hypertension secondary to endocrine disorders: Secondary | ICD-10-CM

## 2024-05-01 DIAGNOSIS — E119 Type 2 diabetes mellitus without complications: Secondary | ICD-10-CM

## 2024-05-01 DIAGNOSIS — I480 Paroxysmal atrial fibrillation: Secondary | ICD-10-CM

## 2024-05-01 DIAGNOSIS — N183 Chronic kidney disease, stage 3 unspecified: Secondary | ICD-10-CM | POA: Insufficient documentation

## 2024-05-01 DIAGNOSIS — N1831 Chronic kidney disease, stage 3a: Secondary | ICD-10-CM

## 2024-05-01 DIAGNOSIS — E785 Hyperlipidemia, unspecified: Secondary | ICD-10-CM

## 2024-05-01 DIAGNOSIS — R42 Dizziness and giddiness: Secondary | ICD-10-CM | POA: Diagnosis not present

## 2024-05-01 DIAGNOSIS — Z7984 Long term (current) use of oral hypoglycemic drugs: Secondary | ICD-10-CM

## 2024-05-01 NOTE — Assessment & Plan Note (Signed)
 Pt seen in ED on 04/06/24 for insidious onset of vertigo symptoms. Stroke workup negative and symptoms resolved after administration of Zofran  and Meclizine . No reported exacerbations of symptoms. Pt agreeable to taking meclizine  if symptoms do reoccur.

## 2024-05-01 NOTE — Assessment & Plan Note (Signed)
 Last lipid panel: LDL 42, HDL 58, triglycerides 118. Hepatic function within normal limits. Continue rosuvastatin  20 mg daily. Will continue to monitor.

## 2024-05-01 NOTE — Assessment & Plan Note (Signed)
 Vit D WNL at this visit. No supplementation required at this time.

## 2024-05-01 NOTE — Assessment & Plan Note (Addendum)
 Stable, followed by cardiology. Continue metoprolol  succinate 25 mg daily, fleccanide 50 mg, and Xarelto  20 mg daily as recommended by cardiology.

## 2024-05-01 NOTE — Assessment & Plan Note (Addendum)
 A1c stable at 6.0. Continue Metformin  500 mg daily and ambulatory BG monitoring. Urine micro WNL and UTD. Diabetic eye exam done at [facility] in February. Will request those records. Will recheck A1c again in 6 months

## 2024-05-01 NOTE — Assessment & Plan Note (Signed)
 Stable on 100 mg zoloft  daily.

## 2024-05-01 NOTE — Assessment & Plan Note (Addendum)
 At goal in office today (<130/80). Continue Metoprolol  25 mg daily. Encouraged ambulatory blood pressure monitoring. Will continue to monitor.

## 2024-05-01 NOTE — Assessment & Plan Note (Signed)
 Stable kidney function. GFR 58. Continue to increase daily fluid intake and avoid nephro-toxic medications (NSAIDs). Will continue to monitor.

## 2024-05-01 NOTE — Progress Notes (Signed)
 Established Patient Office Visit  Subjective   Patient ID: Daisy Nelson, female    DOB: 1946/02/04  Age: 78 y.o. MRN: [id number]  Chief Complaint  Patient presents with   Hospitalization Follow-up    HPI Daisy Nelson is a 78 y.o. F who presents to the clinic today for hospital follow up and UTI Hospital follow up - Pt was seen in the ED on 04/06/24 after she began experiencing severe dizziness. Workup at the ED was negative. She received Zofran  and Meclizine  while in the ED and her condition improved greatly. Today in the clinic she reports that her symptoms have completely resolved and she has not has any exacerbations of symptoms. She still has a supply of meclizine  to take if symptoms do reoccur.   UTI - Pt was seen in clinic on 04/20/24 for dysuria. UA revealed trace blood and trace leukocytes. Cx grew E. Coli. She was prescribed a course of Bactrim  which she has completed. She reports that her symptoms are improved.   HTN - Reports excellent adherence to metoprolol . Checks BP occasionally at home. Denies CP/HA/Dizziness/Faintness upon standing.  DM- Reports excellent adherence to Metformin  500 mg daily. Denies side effects and is tolerating well. Checks her BG at home a few times per week in the mornings. Reports average readings of 110-120 fasting. Just had her annual eye exam with Dr. Nereida Banning at [facility].   HLD- On rosuvastatin  daily. Reports excellent adherence. Denies new myalgias or other side effects.  Anxiety - Patient reports stable moods on Zoloft . Denies SI/HI.  A fib - On fleccanide and Xarelto . Follows with Cardiology. Follow up scheduled in October; Dr. Maximo Spar with [facility].      ROS Per HPI.    Objective:     BP 110/64   Pulse (!) 56   Ht 5\' 5"  (1.651 m)   Wt 154 lb (69.9 kg)   SpO2 96%   BMI 25.63 kg/m    Physical Exam Constitutional:      General: She is not in acute distress.    Appearance: Normal appearance.   Cardiovascular:     Rate and Rhythm: Normal rate and regular rhythm.     Heart sounds: Normal heart sounds. No murmur heard.    No friction rub. No gallop.  Pulmonary:     Effort: Pulmonary effort is normal. No respiratory distress.     Breath sounds: Normal breath sounds.  Abdominal:     Tenderness: There is no abdominal tenderness. There is no right CVA tenderness or left CVA tenderness.  Musculoskeletal:        General: No swelling.  Skin:    General: Skin is warm and dry.  Neurological:     General: No focal deficit present.     Mental Status: She is alert.  Psychiatric:        Mood and Affect: Mood normal.        Behavior: Behavior normal.        Thought Content: Thought content normal.      No results found for any visits on 05/01/24.  Last CBC Lab Results  Component Value Date   WBC 4.7 04/26/2024   HGB 11.6 04/26/2024   HCT 36.0 04/26/2024   MCV 89 04/26/2024   MCH 28.6 04/26/2024   RDW 14.4 04/26/2024   PLT 163 04/26/2024   Last metabolic panel Lab Results  Component Value Date   GLUCOSE 91 04/26/2024   NA 135 04/26/2024   K 4.8  04/26/2024   CL 101 04/26/2024   CO2 18 (L) 04/26/2024   BUN 11 04/26/2024   CREATININE 1.01 (H) 04/26/2024   EGFR 58 (L) 04/26/2024   CALCIUM  9.3 04/26/2024   PROT 6.9 04/26/2024   ALBUMIN 4.5 04/26/2024   LABGLOB 2.4 04/26/2024   AGRATIO 1.7 07/14/2022   BILITOT 0.3 04/26/2024   ALKPHOS 98 04/26/2024   AST 27 04/26/2024   ALT 19 04/26/2024   ANIONGAP 7 08/25/2016   Last lipids Lab Results  Component Value Date   CHOL 121 04/26/2024   HDL 58 04/26/2024   LDLCALC 42 04/26/2024   TRIG 118 04/26/2024   CHOLHDL 2.1 04/26/2024   Last hemoglobin A1c Lab Results  Component Value Date   HGBA1C 6.0 (H) 04/26/2024   Last thyroid  functions Lab Results  Component Value Date   TSH 1.790 11/25/2021   Last vitamin D  Lab Results  Component Value Date   VD25OH 35.6 04/26/2024      The ASCVD Risk score (Arnett DK,  et al., 2019) failed to calculate for the following reasons:   The valid total cholesterol range is 130 to 320 mg/dL    Assessment & Plan:   Vertigo Assessment & Plan: Pt seen in ED on 04/06/24 for insidious onset of vertigo symptoms. Stroke workup negative and symptoms resolved after administration of Zofran  and Meclizine . No reported exacerbations of symptoms. Pt agreeable to taking meclizine  if symptoms do reoccur.   Anxiety Assessment & Plan: Stable on 100 mg zoloft  daily.   Type 2 diabetes mellitus without complication, without long-term current use of insulin (HCC) Assessment & Plan: A1c stable at 6.0. Continue Metformin  500 mg daily and ambulatory BG monitoring. Urine micro WNL and UTD. Diabetic eye exam done at [facility] in February. Will request those records. Will recheck A1c again in 6 months   Hyperlipidemia associated with type 2 diabetes mellitus (HCC) Assessment & Plan: Last lipid panel: LDL 42, HDL 58, triglycerides 118. Hepatic function within normal limits. Continue rosuvastatin  20 mg daily. Will continue to monitor.     PAF (paroxysmal atrial fibrillation) (HCC) Assessment & Plan: Stable, followed by cardiology. Continue metoprolol  succinate 25 mg daily, fleccanide 50 mg, and Xarelto  20 mg daily as recommended by cardiology.    Hypertension associated with diabetes [facility]) Assessment & Plan: At goal in office today (<130/80). Continue Metoprolol  25 mg daily. Encouraged ambulatory blood pressure monitoring. Will continue to monitor.   Vitamin D  deficiency Assessment & Plan: Vit D WNL at this visit. No supplementation required at this time.   Stage 3a chronic kidney disease (HCC) Assessment & Plan: Stable kidney function. GFR 58. Continue to increase daily fluid intake and avoid nephro-toxic medications (NSAIDs). Will continue to monitor.      Return in about 6 months (around 10/31/2024) for HTN, DM, Mood, HLD.    Odilia Bennett, PA-C

## 2024-05-01 NOTE — Patient Instructions (Addendum)
 It was nice to see you today!  As we discussed in clinic:  -Continue taking your Metformin  500 mg once daily and checking blood sugar at home periodically -Continue taking your rosuvastatin  as prescribed  -Continue taking Zoloft  for mood  -Continue metoprolol  for your blood pressure and monitoring it periodically at home. -Continue scheduled follow-up with Cardiology for management of your a fib.  -I am glad to hear that your Vertigo has resolved! Keep the meclizine  on hand in case you have a recurrence of symptoms. -I am also glad to hear your UTI has cleared and symptoms are no longer bothering you. If symptoms reoccur, you can call us  to schedule an appointment. It was so nice to meet you today!   If you have any problems before your next visit feel free to message me via MyChart (minor issues or questions) or call the office, otherwise you may reach out to schedule an office visit.  Thank you! Meryl Acosta, PA-C

## 2024-05-03 ENCOUNTER — Ambulatory Visit: Admitting: Family Medicine

## 2024-05-19 ENCOUNTER — Other Ambulatory Visit: Payer: Self-pay | Admitting: Family Medicine

## 2024-05-19 DIAGNOSIS — E782 Mixed hyperlipidemia: Secondary | ICD-10-CM

## 2024-06-07 ENCOUNTER — Other Ambulatory Visit: Payer: Self-pay | Admitting: Family Medicine

## 2024-06-07 DIAGNOSIS — J300 Vasomotor rhinitis: Secondary | ICD-10-CM

## 2024-06-26 ENCOUNTER — Other Ambulatory Visit: Payer: Self-pay | Admitting: Internal Medicine

## 2024-06-26 ENCOUNTER — Other Ambulatory Visit: Payer: Self-pay | Admitting: Family Medicine

## 2024-06-26 DIAGNOSIS — F39 Unspecified mood [affective] disorder: Secondary | ICD-10-CM

## 2024-06-26 DIAGNOSIS — F419 Anxiety disorder, unspecified: Secondary | ICD-10-CM

## 2024-07-23 ENCOUNTER — Other Ambulatory Visit: Payer: Self-pay

## 2024-07-23 DIAGNOSIS — J300 Vasomotor rhinitis: Secondary | ICD-10-CM

## 2024-08-13 ENCOUNTER — Other Ambulatory Visit: Payer: Self-pay

## 2024-08-13 DIAGNOSIS — E782 Mixed hyperlipidemia: Secondary | ICD-10-CM

## 2024-08-17 ENCOUNTER — Ambulatory Visit: Payer: PPO

## 2024-08-17 DIAGNOSIS — Z0000 Encounter for general adult medical examination without abnormal findings: Secondary | ICD-10-CM | POA: Diagnosis not present

## 2024-08-17 NOTE — Progress Notes (Signed)
 Subjective:   Daisy Nelson is a 78 y.o. who presents for a Medicare Wellness preventive visit.  As a reminder, Annual Wellness Visits don't include a physical exam, and some assessments may be limited, especially if this visit is performed virtually. We may recommend an in-person follow-up visit with your provider if needed.  Visit Complete: Virtual I connected with  Daisy Nelson on 08/17/24 by a audio enabled telemedicine application and verified that I am speaking with the correct person using two identifiers.  Patient Location: Home  Provider Location: Home Office  I discussed the limitations of evaluation and management by telemedicine. The patient expressed understanding and agreed to proceed.  Vital Signs: Because this visit was a virtual/telehealth visit, some criteria may be missing or patient reported. Any vitals not documented were not able to be obtained and vitals that have been documented are patient reported.  VideoError- Librarian, academic were attempted between this provider and patient, however failed, due to patient having technical difficulties OR patient did not have access to video capability.  We continued and completed visit with audio only.   Persons Participating in Visit: Patient.  AWV Questionnaire: Yes: Patient Medicare AWV questionnaire was completed by the patient on 08/16/2024; I have confirmed that all information answered by patient is correct and no changes since this date.  Cardiac Risk Factors include: advanced age (>46men, >68 women);diabetes mellitus;dyslipidemia;hypertension     Objective:    Today's Vitals   There is no height or weight on file to calculate BMI.     08/17/2024    1:05 PM 04/06/2024    3:24 AM 11/08/2023    2:39 PM 08/12/2023    1:18 PM 08/19/2020   10:05 AM 05/10/2018   11:17 AM 09/20/2017    1:26 PM  Advanced Directives  Does Patient Have a Medical Advance Directive? No No No No No No   No   Would patient like information on creating a medical advance directive? No - Patient declined   No - Patient declined Yes (MAU/Ambulatory/Procedural Areas - Information given) No - Patient declined  No - Patient declined      Data saved with a previous flowsheet row definition    Current Medications (verified) Outpatient Encounter Medications as of 08/17/2024  Medication Sig   acetaminophen  (TYLENOL ) 650 MG CR tablet Take 650 mg by mouth every 8 (eight) hours as needed for pain. For arthritis of hands.   cimetidine (TAGAMET) 200 MG tablet Take 200 mg by mouth every evening.   flecainide  (TAMBOCOR ) 50 MG tablet TAKE 1 TABLET BY MOUTH TWICE DAILY . APPOINTMENT REQUIRED FOR FUTURE REFILLS   fluticasone  (FLONASE ) 50 MCG/ACT nasal spray Use 2 spray(s) in each nostril once daily   glucose blood test strip Use as instructed   Loratadine 10 MG CAPS    meclizine  (ANTIVERT ) 25 MG tablet Take 1 tablet (25 mg total) by mouth 2 (two) times daily as needed for dizziness.   metFORMIN  (GLUCOPHAGE ) 500 MG tablet Take 1 tablet (500 mg total) by mouth daily with breakfast.   metoprolol  succinate (TOPROL -XL) 25 MG 24 hr tablet Take 1 tablet by mouth once daily   rivaroxaban  (XARELTO ) 20 MG TABS tablet TAKE 1 TABLET BY MOUTH ONCE DAILY WITH SUPPER   rosuvastatin  (CRESTOR ) 20 MG tablet Take 1 tablet by mouth once daily   sertraline  (ZOLOFT ) 100 MG tablet Take 1 tablet by mouth once daily   No facility-administered encounter medications on file as of 08/17/2024.  Allergies (verified) Adhesive [tape], Erythromycin, Naproxen, and Penicillins   History: Past Medical History:  Diagnosis Date   AF (atrial fibrillation) (HCC)    AF with RVR 01/2013   Allergy    Anxiety    Atrial fibrillation with RVR (HCC) 08/31/2015   Colon polyps    Complication of anesthesia 2011   colonscopy- slow to awaken   Depression    DJD (degenerative joint disease) of cervical spine    Dysrhythmia    afib   Family  history of CABG    GERD (gastroesophageal reflux disease)    History of kidney stones    Hyperglycemia    Hyperlipidemia    Hypertension    Jaw pain 08/31/2015   Pneumonia    PONV (postoperative nausea and vomiting)    Wears glasses    Past Surgical History:  Procedure Laterality Date   25 GAUGE PARS PLANA VITRECTOMY WITH 20 GAUGE MVR PORT FOR MACULAR HOLE Right 08/25/2016   Procedure: 25 GAUGE PARS PLANA VITRECTOMY; RIGHT EYE;  Surgeon: Daisy JONETTA Ku, MD;  Location: [facility] OR;  Service: Ophthalmology;  Laterality: Right;   ABDOMINAL HYSTERECTOMY  1991   CATARACT EXTRACTION W/ INTRAOCULAR LENS  IMPLANT, BILATERAL Bilateral 03/2013   CESAREAN SECTION  1976   COLONOSCOPY  2011   polyps   COLONOSCOPY WITH PROPOFOL  N/A 08/19/2020   Procedure: COLONOSCOPY WITH PROPOFOL ;  Surgeon: Daisy Victory LITTIE DOUGLAS, MD;  Location: [facility];  Service: Gastroenterology;  Laterality: N/A;   EYE SURGERY     GANGLION CYST EXCISION Left    hand   GAS/FLUID EXCHANGE Right 08/25/2016   Procedure: GAS/FLUID EXCHANGE RIGHT EYE;  Surgeon: Daisy JONETTA Ku, MD;  Location: [facility] OR;  Service: Ophthalmology;  Laterality: Right;   IRIDECTOMY Right 08/25/2016   Procedure: PERIPHERAL IRIDECTOMY RIGHT EYE;  Surgeon: Daisy JONETTA Ku, MD;  Location: [facility] OR;  Service: Ophthalmology;  Laterality: Right;   LAPAROSCOPY  1976   MEMBRANE PEEL Right 08/25/2016   Procedure: MEMBRANE PEEL RIGHT EYE;  Surgeon: Daisy JONETTA Ku, MD;  Location: [facility] OR;  Service: Ophthalmology;  Laterality: Right;   NM MYOCAR PERF WALL MOTION  01/2013   lexiscan  - no pharmacologically induced ischemia, fixed defects in apical segments of anterior septum & inferior lateral wall    PARS PLANA VITRECTOMY Right 08/25/2016   laser, gas injection, AC wash out. Membrane peel right eye   POLYPECTOMY N/A 08/19/2020   Procedure: POLYPECTOMY;  Surgeon: Daisy Victory LITTIE DOUGLAS, MD;  Location: [facility];  Service: Gastroenterology;  Laterality: N/A;   TRANSTHORACIC  ECHOCARDIOGRAM  01/2013   EF 55-60%, mod conc LVH; mild MR with mildly thickened leaflets; mild TR; PA peak pressure   TUBAL LIGATION  1977   Family History  Problem Relation Age of Onset   Stroke Mother    Diabetes Father    Heart disease Father        CABG   Arthritis Father        C6-7   Skin cancer Father        SCC   Arthritis Brother        s/p THR   Social History   Socioeconomic History   Marital status: Married    Spouse name: Lamar   Number of children: 1   Years of education: 12   Highest education level: 12th grade  Occupational History   Occupation: Advertising account planner  Tobacco Use   Smoking status: Former    Current packs/day: 0.00  Average packs/day: 0.5 packs/day for 41.8 years (20.9 ttl pk-yrs)    Types: Cigarettes    Start date: 03/15/1963    Quit date: 01/08/2005    Years since quitting: 19.6    Passive exposure: Never   Smokeless tobacco: Never  Vaping Use   Vaping status: Never Used  Substance and Sexual Activity   Alcohol  use: No   Drug use: No   Sexual activity: Yes    Partners: Male    Birth control/protection: Surgical  Other Topics Concern   Not on file  Social History Narrative   Lives with her husband and their cat, Ellie Mae.   Social Drivers of Corporate investment banker Strain: Low Risk  (08/16/2024)   Overall Financial Resource Strain (CARDIA)    Difficulty of Paying Living Expenses: Not hard at all  Food Insecurity: No Food Insecurity (08/16/2024)   Hunger Vital Sign    Worried About Running Out of Food in the Last Year: Never true    Ran Out of Food in the Last Year: Never true  Transportation Needs: No Transportation Needs (08/16/2024)   PRAPARE - Administrator, Civil Service (Medical): No    Lack of Transportation (Non-Medical): No  Physical Activity: Unknown (08/16/2024)   Exercise Vital Sign    Days of Exercise per Week: Patient declined    Minutes of Exercise per Session: Not on file  Stress: No  Stress Concern Present (08/16/2024)   [employer] of Occupational Health - Occupational Stress Questionnaire    Feeling of Stress: Not at all  Social Connections: Unknown (08/16/2024)   Social Connection and Isolation Panel    Frequency of Communication with Friends and Family: Patient declined    Frequency of Social Gatherings with Friends and Family: Patient declined    Attends Religious Services: Patient declined    Database administrator or Organizations: No    Attends Engineer, structural: Not on file    Marital Status: Married    Tobacco Counseling Counseling given: Not Answered    Clinical Intake:  Pre-visit preparation completed: Yes  Pain : No/denies pain     Nutritional Risks: None Diabetes: Yes CBG done?: No Did pt. bring in CBG monitor from home?: No  Lab Results  Component Value Date   HGBA1C 6.0 (H) 04/26/2024   HGBA1C 6.0 (A) 12/23/2023   HGBA1C 6.0 12/23/2023   HGBA1C 6.0 12/23/2023     How often do you need to have someone help you when you read instructions, pamphlets, or other written materials from your doctor or pharmacy?: 1 - Never  Interpreter Needed?: No  Information entered by :: NAllen LPN   Activities of Daily Living     08/16/2024    1:48 PM  In your present state of health, do you have any difficulty performing the following activities:  Hearing? 0  Vision? 0  Difficulty concentrating or making decisions? 0  Walking or climbing stairs? 0  Dressing or bathing? 0  Doing errands, shopping? 0  Preparing Food and eating ? N  Using the Toilet? N  In the past six months, have you accidently leaked urine? Y  Do you have problems with loss of bowel control? N  Managing your Medications? N  Managing your Finances? N  Housekeeping or managing your Housekeeping? N    Patient Care Team: Gayle Saddie [name] as PCP - General (Physician Assistant) Alvia Daisy BIRCH, MD as Consulting Physician (Ophthalmology) Rollin Dover,  MD as  Consulting Physician (Gastroenterology) Daisy Victory LITTIE MOULD, MD as Consulting Physician (Gastroenterology) Mona Vinie BROCKS, MD as Consulting Physician (Cardiology) Madelyn Deanne BRAVO, OD (Optometry)  I have updated your Care Teams any recent Medical Services you may have received from other providers in the past year.     Assessment:   This is a routine wellness examination for Keierra.  Hearing/Vision screen Hearing Screening - Comments:: Denies hearing issues Vision Screening - Comments:: Regular eye exams, Dr. Murray Madelyn   Goals Addressed             This Visit's Progress    Patient Stated       08/17/2024, denies goals       Depression Screen     08/17/2024    1:06 PM 05/01/2024   10:15 AM 12/23/2023    1:13 PM 08/12/2023    1:11 PM 05/12/2023    2:07 PM 11/05/2022    2:36 PM 07/06/2022    9:14 AM  PHQ 2/9 Scores  PHQ - 2 Score 0 0 0 0 0 0 0  PHQ- 9 Score 0 1 0 0 1 1 0    Fall Risk     08/16/2024    1:48 PM 05/01/2024   10:15 AM 08/09/2023    2:08 PM 05/12/2023    2:07 PM 07/06/2022    9:13 AM  Fall Risk   Falls in the past year? 0 0 0 0 1  Number falls in past yr: 0 0 0 0 1  Injury with Fall? 0 0 0 0 0  Risk for fall due to : Medication side effect No Fall Risks No Fall Risks No Fall Risks History of fall(s)  Follow up Falls prevention discussed;Falls evaluation completed Falls evaluation completed Falls prevention discussed Falls evaluation completed Falls evaluation completed      Data saved with a previous flowsheet row definition    MEDICARE RISK AT HOME:  Medicare Risk at Home Any stairs in or around the home?: (Patient-Rptd) Yes If so, are there any without handrails?: (Patient-Rptd) No Home free of loose throw rugs in walkways, pet beds, electrical cords, etc?: (Patient-Rptd) Yes Adequate lighting in your home to reduce risk of falls?: (Patient-Rptd) Yes Life alert?: (Patient-Rptd) No Use of a cane, walker or w/c?: (Patient-Rptd) No Grab bars in the  bathroom?: (Patient-Rptd) No Shower chair or bench in shower?: (Patient-Rptd) No Elevated toilet seat or a handicapped toilet?: (Patient-Rptd) Yes  TIMED UP AND GO:  Was the test performed?  No  Cognitive Function: 6CIT completed        08/17/2024    1:06 PM 08/12/2023    1:18 PM 07/06/2022    9:39 AM 01/20/2021    8:37 AM 10/02/2019    9:52 AM  6CIT Screen  What Year? 0 points 0 points 0 points 0 points 0 points  What month? 0 points 0 points 0 points 0 points 0 points  What time? 0 points 0 points 0 points 0 points 0 points  Count back from 20 0 points 0 points 0 points 0 points 0 points  Months in reverse 0 points 0 points 0 points 0 points 0 points  Repeat phrase 0 points 0 points 2 points 0 points 0 points  Total Score 0 points 0 points 2 points 0 points 0 points    Immunizations Immunization History  Administered Date(s) Administered   Fluad Quad(high Dose 65+) 09/12/2019, 11/25/2021, 08/13/2023   INFLUENZA, HIGH DOSE SEASONAL PF 09/12/2018, 09/13/2020   Influenza  Split 08/24/2011   Influenza, Seasonal, Injecte, Preservative Fre 11/15/2012   Influenza,inj,Quad PF,6+ Mos 08/03/2013, 09/28/2014, 09/24/2015, 09/22/2016, 08/30/2017   Influenza-Unspecified 08/30/2017, 09/07/2022   PFIZER(Purple Top)SARS-COV-2 Vaccination 01/20/2020, 02/13/2020   PNEUMOCOCCAL CONJUGATE-20 05/13/2023   Pneumococcal Conjugate-13 09/28/2014   Pneumococcal Polysaccharide-23 08/03/2013   Tdap 07/06/2022   Zoster Recombinant(Shingrix ) 10/04/2019, 12/05/2019   Zoster, Live 11/27/2013    Screening Tests Health Maintenance  Topic Date Due   COVID-19 Vaccine (3 - Pfizer risk series) 03/12/2020   OPHTHALMOLOGY EXAM  01/20/2024   Influenza Vaccine  06/30/2024   HEMOGLOBIN A1C  10/27/2024   FOOT EXAM  12/22/2024   Diabetic kidney evaluation - eGFR measurement  04/26/2025   Diabetic kidney evaluation - Urine ACR  04/26/2025   Medicare Annual Wellness (AWV)  08/17/2025   Colonoscopy  08/19/2025    DTaP/Tdap/Td (2 - Td or Tdap) 07/06/2032   Pneumococcal Vaccine: 50+ Years  Completed   DEXA SCAN  Completed   Hepatitis C Screening  Completed   Zoster Vaccines- Shingrix   Completed   HPV VACCINES  Aged Out   Meningococcal B Vaccine  Aged Out   Mammogram  Discontinued    Health Maintenance Items Addressed: Due for flu vaccine. Declines covid vaccine.  Additional Screening:  Vision Screening: Recommended annual ophthalmology exams for early detection of glaucoma and other disorders of the eye. Is the patient up to date with their annual eye exam?  Yes  Who is the provider or what is the name of the office in which the patient attends annual eye exams? Dr. Madelyn  Dental Screening: Recommended annual dental exams for proper oral hygiene  Community Resource Referral / Chronic Care Management: CRR required this visit?  No   CCM required this visit?  No   Plan:    I have personally reviewed and noted the following in the patient's chart:   Medical and social history Use of alcohol , tobacco or illicit drugs  Current medications and supplements including opioid prescriptions. Patient is not currently taking opioid prescriptions. Functional ability and status Nutritional status Physical activity Advanced directives List of other physicians Hospitalizations, surgeries, and ER visits in previous 12 months Vitals Screenings to include cognitive, depression, and falls Referrals and appointments  In addition, I have reviewed and discussed with patient certain preventive protocols, quality metrics, and best practice recommendations. A written personalized care plan for preventive services as well as general preventive health recommendations were provided to patient.   Ardella FORBES Dawn, LPN   [id number]   After Visit Summary: (MyChart) Due to this being a telephonic visit, the after visit summary with patients personalized plan was offered to patient via MyChart   Notes: Nothing  significant to report at this time.

## 2024-08-17 NOTE — Patient Instructions (Signed)
 Daisy Nelson,  Thank you for taking the time for your Medicare Wellness Visit. I appreciate your continued commitment to your health goals. Please review the care plan we discussed, and feel free to reach out if I can assist you further.  Medicare recommends these wellness visits once per year to help you and your care team stay ahead of potential health issues. These visits are designed to focus on prevention, allowing your provider to concentrate on managing your acute and chronic conditions during your regular appointments.  Please note that Annual Wellness Visits do not include a physical exam. Some assessments may be limited, especially if the visit was conducted virtually. If needed, we may recommend a separate in-person follow-up with your provider.  Ongoing Care Seeing your primary care provider every 3 to 6 months helps us  monitor your health and provide consistent, personalized care.   Referrals If a referral was made during today's visit and you haven't received any updates within two weeks, please contact the referred provider directly to check on the status.  Recommended Screenings:  Health Maintenance  Topic Date Due   COVID-19 Vaccine (3 - Pfizer risk series) 03/12/2020   Eye exam for diabetics  01/20/2024   Flu Shot  06/30/2024   Hemoglobin A1C  10/27/2024   Complete foot exam   12/22/2024   Yearly kidney function blood test for diabetes  04/26/2025   Yearly kidney health urinalysis for diabetes  04/26/2025   Medicare Annual Wellness Visit  08/17/2025   Colon Cancer Screening  08/19/2025   DTaP/Tdap/Td vaccine (2 - Td or Tdap) 07/06/2032   Pneumococcal Vaccine for age over 64  Completed   DEXA scan (bone density measurement)  Completed   Hepatitis C Screening  Completed   Zoster (Shingles) Vaccine  Completed   HPV Vaccine  Aged Out   Meningitis B Vaccine  Aged Out   Breast Cancer Screening  Discontinued       08/17/2024    1:05 PM  Advanced Directives  Does  Patient Have a Medical Advance Directive? No  Would patient like information on creating a medical advance directive? No - Patient declined   Advance Care Planning is important because it: Ensures you receive medical care that aligns with your values, goals, and preferences. Provides guidance to your family and loved ones, reducing the emotional burden of decision-making during critical moments.  Vision: Annual vision screenings are recommended for early detection of glaucoma, cataracts, and diabetic retinopathy. These exams can also reveal signs of chronic conditions such as diabetes and high blood pressure.  Dental: Annual dental screenings help detect early signs of oral cancer, gum disease, and other conditions linked to overall health, including heart disease and diabetes.  Please see the attached documents for additional preventive care recommendations.

## 2024-09-22 ENCOUNTER — Other Ambulatory Visit: Payer: Self-pay

## 2024-09-22 DIAGNOSIS — F39 Unspecified mood [affective] disorder: Secondary | ICD-10-CM

## 2024-09-22 DIAGNOSIS — F419 Anxiety disorder, unspecified: Secondary | ICD-10-CM

## 2024-09-25 ENCOUNTER — Other Ambulatory Visit: Payer: Self-pay | Admitting: Internal Medicine

## 2024-09-27 ENCOUNTER — Other Ambulatory Visit: Payer: Self-pay | Admitting: Internal Medicine

## 2024-09-27 DIAGNOSIS — I480 Paroxysmal atrial fibrillation: Secondary | ICD-10-CM

## 2024-09-27 NOTE — Telephone Encounter (Signed)
 Prescription refill request for Xarelto  received.  Indication:afib Last office visit:upcoming Weight:69.9  kg Age:78 Scr:1.01  5/25 CrCl:52.29  ml/min  Prescription refilled

## 2024-09-29 ENCOUNTER — Ambulatory Visit: Attending: Internal Medicine | Admitting: Internal Medicine

## 2024-09-29 ENCOUNTER — Encounter: Payer: Self-pay | Admitting: Internal Medicine

## 2024-09-29 VITALS — BP 116/60 | HR 53 | Ht 65.0 in | Wt 157.2 lb

## 2024-09-29 DIAGNOSIS — I700 Atherosclerosis of aorta: Secondary | ICD-10-CM | POA: Diagnosis not present

## 2024-09-29 DIAGNOSIS — Z79899 Other long term (current) drug therapy: Secondary | ICD-10-CM | POA: Diagnosis not present

## 2024-09-29 DIAGNOSIS — Z5181 Encounter for therapeutic drug level monitoring: Secondary | ICD-10-CM | POA: Diagnosis not present

## 2024-09-29 DIAGNOSIS — E785 Hyperlipidemia, unspecified: Secondary | ICD-10-CM

## 2024-09-29 DIAGNOSIS — I480 Paroxysmal atrial fibrillation: Secondary | ICD-10-CM | POA: Diagnosis not present

## 2024-09-29 NOTE — Progress Notes (Signed)
 OFFICE NOTE  Chief Complaint:  No complaints  Primary Care Physician: Daisy Nelson Nelson  ID:  Daisy Nelson, Daisy Nelson 06/09/1946, MRN [id number]  PCP:  Daisy Saddie JULIANNA, PA-C  Primary Cardiologist:  Daisy Nelson     History of Present Illness: Daisy Nelson is a 78 y.o. female no prior history of CAD or AF, presented to the ER 3/10 around 3am with complaints of jaw pain, palpitations, and SOB. Her symptoms woke her from sleep around 12:30am. In the ER she was noted to be in Rapid AF. Her EKG shows diffuse ST depression. Initial Troponin is negative. Her symptoms improved after Diltiazem  IV was started. She denied any prior history of similar symptoms in the past. She did take Mucinex  D earlier this week for sinus infection.   The patient was admitted to [facility] and started on diltiazem , heparin , ASA, beta blocker and nitrates. She spontaneously converted to NSR and ruled out for MI. Lexiscan  myovew revealed normal LVF, no ischemia and fixed defects involving the apical segments of the inferior septum and inferior lateral walls. 2D echo confirmed normal EF of 55-60%. Mild TR. CHADSVSC= 1. She was started on Xarelto .  Lopressor  was continued.   She was last seen by Daisy Sow, PA-C, as she was about to have cataract surgery.  It went very well for her and she can now see clearly.  She reports only an accessional fluttering in her chest.  I did decrease her lopressor  to 12.5 mg bid during the last visit because she was having a HR in the 40's.  She otherwise denies  nausea, vomiting, fever, chest pain, shortness of breath, orthopnea, dizziness, PND, cough, congestion, abdominal pain, hematochezia, melena, lower extremity edema.  Daisy Nelson back in the office today. She is currently without complaints. She is active denies any chest pain or shortness of breath. She very rarely gets palpitations the last only for a few seconds. She denies any recurrent atrial fibrillation and her EKG today sinus rhythm.  She's had no bleeding problems on aspirin . She had laboratory work in October which shows a well controlled lipid profile on pravastatin . Weight is stable  I saw Daisy Nelson back today in the office. Overall she is doing very well. She denies any chest pain or shortness of breath. She's had no recurrent A. Fib episodes. She has not needed to take her flecainide  as needed. She continues on Xarelto  without any bleeding problems. Blood pressure is well-controlled today.  01/18/2017  Daisy Nelson returns today for follow-up. She reports no significant recurrent atrial fibrillation. EKG today shows sinus bradycardia with nonspecific ST changes. She is tolerating Xarelto  without any bleeding problems. She has not needed the flecainide  pocket pill strategy, but is due for a refill. QTC is 395 ms.  01/11/2018  Daisy Nelson was seen today in follow-up.  Overall she reports doing well.  She denies any significant recurrent atrial fibrillation.  She occasionally feels palpitations.  She does not do active heart rate monitoring.  We discussed the [employer] application as well as the newest iteration of the apple watch, both of which can monitor heart rhythms.  He is tolerating Xarelto  without bleeding problems.  She has not required flecainide .  She may need a new prescription as a drug could be older and she should check back with us  when she looks at her bottle.  06/29/2019  Daisy Nelson is seen today in follow-up.  Overall she continues to do well.  She says last summer she had  one episode of what she thought was A. fib and took 2 flecainide  at night and went to sleep.  She says she woke up and she felt fine without any recurrence.  She continues to monitor heart rate and has not noted any recurrent A. fib.  She denies any bleeding complications on Xarelto .  Overall energy level is good.  She is followed by her PCP closely and recently did a virtual visit.  Blood pressures well controlled  today.  07/18/2020  Daisy Nelson is seen today in follow-up.  Overall she is doing well.  She says that recently though she has had more episodes of atrial fibrillation.  She had an episode in December which responded to pocket pill flecainide .  Since then she has had 3 other episodes and they generally last less than 24 hours and seem to respond to flecainide  but she feels drained for at least the next 24 to 48 hours.  Her husband was diagnosed with multivessel coronary disease this spring as well and underwent bypass surgery and he is doing better.  She does have a screening colonoscopy scheduled for September.  10/22/2020  Daisy Nelson returns today for follow-up.  Reports that she recently developed diverticulitis episode shortly after having her colonoscopy.  She is currently on Cipro  and Flagyl , however her gastroenterologist may not have known she was taking flecainide .  This can interact with Cipro  causing QT prolongation.  Fortunately her EKG is stable today showing sinus bradycardia with a QTC of 434 ms.  She denies any chest pain.  She is not actually had repeat stress testing since a Myoview  stress test while hospitalized in 2014.  Recent imaging of the abdomen showed aortic atherosclerosis.  That being said her blood pressure is well controlled.  She is on high potency statin therapy with well-controlled dyslipidemia and a total cholesterol 121, HDL 55, triglycerides 90 and LDL of 49.  05/20/2021  Daisy Nelson is seen today in follow-up.  She reports good control over her A. fib on flecainide .  QTC today was 407 ms.  She is in sinus bradycardia.  She reports tolerating Xarelto  without any issues.  She not undergo treadmill stress testing.  She was having some issues with her husband's health and she was caring for him and then developed problems with her hip and does not feel that she can walk on a treadmill.  09/28/2023  Daisy Nelson returns today for follow-up.  She says she has overall  been doing very well.  She denies any recurrent A-fib.  She remains on flecainide .  She denies any chest pain or worsening shortness of breath with exertion.  Blood pressure is well-controlled.  EKG shows sinus rhythm today.  She is on Xarelto  without any bleeding issues or easy falls.  Cholesterol is also well treated with total 143, HDL 56, LDL 64 and triglycerides 134.  Hemoglobin A1c was 6.5%.  09/29/2024  Daisy Nelson is seen today follow-up.  She is doing well.  She denies any recurrent atrial fibrillation.  She reports the flecainide  seems to be working well.  QTc and QRS intervals are unchanged.  She denies any bleeding issues on Xarelto .  Hemoglobin recently was low normal at 11.6.  This is however notable because the normal scale did change recently.  Cholesterol also has been well-controlled.  Her LDL and May was 42.   Wt Readings from Last 3 Encounters:  09/29/24 157 lb 3.2 oz (71.3 kg)  05/01/24 154 lb (69.9 kg)  04/06/24 150  lb (68 kg)     Past Medical History:  Diagnosis Date   AF (atrial fibrillation) (HCC)    AF with RVR 01/2013   Allergy    Anxiety    Atrial fibrillation with RVR (HCC) 08/31/2015   Colon polyps    Complication of anesthesia 2011   colonscopy- slow to awaken   Depression    DJD (degenerative joint disease) of cervical spine    Dysrhythmia    afib   Family history of CABG    GERD (gastroesophageal reflux disease)    History of kidney stones    Hyperglycemia    Hyperlipidemia    Hypertension    Jaw pain 08/31/2015   Pneumonia    PONV (postoperative nausea and vomiting)    Wears glasses     Current Outpatient Medications  Medication Sig Dispense Refill   acetaminophen  (TYLENOL ) 650 MG CR tablet Take 650 mg by mouth every 8 (eight) hours as needed for pain. For arthritis of hands.     cimetidine (TAGAMET) 200 MG tablet Take 200 mg by mouth every evening.     flecainide  (TAMBOCOR ) 50 MG tablet Take 1 tablet (50 mg total) by mouth 2 (two) times  daily. 180 tablet 0   fluticasone  (FLONASE ) 50 MCG/ACT nasal spray Use 2 spray(s) in each nostril once daily 16 g 0   glucose blood test strip Use as instructed 100 each 12   Loratadine 10 MG CAPS      meclizine  (ANTIVERT ) 25 MG tablet Take 1 tablet (25 mg total) by mouth 2 (two) times daily as needed for dizziness. 20 tablet 0   metFORMIN  (GLUCOPHAGE ) 500 MG tablet Take 1 tablet (500 mg total) by mouth daily with breakfast. 90 tablet 1   metoprolol  succinate (TOPROL -XL) 25 MG 24 hr tablet Take 1 tablet by mouth once daily 90 tablet 3   rivaroxaban  (XARELTO ) 20 MG TABS tablet TAKE 1 TABLET BY MOUTH ONCE DAILY WITH SUPPER 90 tablet 1   rosuvastatin  (CRESTOR ) 20 MG tablet Take 1 tablet by mouth once daily 90 tablet 0   sertraline  (ZOLOFT ) 100 MG tablet Take 1 tablet by mouth once daily 90 tablet 0   No current facility-administered medications for this visit.    Allergies:    Allergies  Allergen Reactions   Adhesive [Tape]     Burn skin   Erythromycin     Gi intolerance - nausea, vomiting, and abd pains   Naproxen Nausea And Vomiting    Abdominal pain    Penicillins Hives    Has patient had a PCN reaction causing immediate rash, facial/tongue/throat swelling, SOB or lightheadedness with hypotension: UNKNOWN Has patient had a PCN reaction causing severe rash involving mucus membranes or skin necrosis: No Has patient had a PCN reaction that required hospitalization No Has patient had a PCN reaction occurring within the last 10 years: No If all of the above answers are NO, then may proceed with Cephalosporin use.     Social History:  The patient  reports that she quit smoking about 19 years ago. Her smoking use included cigarettes. She started smoking about 61 years ago. She has a 20.9 pack-year smoking history. She has never been exposed to tobacco smoke. She has never used smokeless tobacco. She reports that she does not drink alcohol  and does not use drugs.   Family history:    Family History  Problem Relation Age of Onset   Stroke Mother    Diabetes Father    Heart  disease Father        CABG   Arthritis Father        C6-7   Skin cancer Father        SCC   Arthritis Brother        s/p THR    ROS: A comprehensive review of systems was negative.  HOME MEDS: Current Outpatient Medications  Medication Sig Dispense Refill   acetaminophen  (TYLENOL ) 650 MG CR tablet Take 650 mg by mouth every 8 (eight) hours as needed for pain. For arthritis of hands.     cimetidine (TAGAMET) 200 MG tablet Take 200 mg by mouth every evening.     flecainide  (TAMBOCOR ) 50 MG tablet Take 1 tablet (50 mg total) by mouth 2 (two) times daily. 180 tablet 0   fluticasone  (FLONASE ) 50 MCG/ACT nasal spray Use 2 spray(s) in each nostril once daily 16 g 0   glucose blood test strip Use as instructed 100 each 12   Loratadine 10 MG CAPS      meclizine  (ANTIVERT ) 25 MG tablet Take 1 tablet (25 mg total) by mouth 2 (two) times daily as needed for dizziness. 20 tablet 0   metFORMIN  (GLUCOPHAGE ) 500 MG tablet Take 1 tablet (500 mg total) by mouth daily with breakfast. 90 tablet 1   metoprolol  succinate (TOPROL -XL) 25 MG 24 hr tablet Take 1 tablet by mouth once daily 90 tablet 3   rivaroxaban  (XARELTO ) 20 MG TABS tablet TAKE 1 TABLET BY MOUTH ONCE DAILY WITH SUPPER 90 tablet 1   rosuvastatin  (CRESTOR ) 20 MG tablet Take 1 tablet by mouth once daily 90 tablet 0   sertraline  (ZOLOFT ) 100 MG tablet Take 1 tablet by mouth once daily 90 tablet 0   No current facility-administered medications for this visit.    LABS/IMAGING: No results found for this or any previous visit (from the past 48 hours). No results found.  VITALS: BP 116/60   Pulse (!) 53   Ht 5' 5 (1.651 m)   Wt 157 lb 3.2 oz (71.3 kg)   SpO2 95%   BMI 26.16 kg/m   EXAM: General appearance: alert and no distress Neck: no carotid bruit and no JVD Lungs: clear to auscultation bilaterally Heart: regular rate and rhythm, S1,  S2 normal, no murmur, click, rub or gallop Abdomen: soft, non-tender; bowel sounds normal; no masses,  no organomegaly Extremities: extremities normal, atraumatic, no cyanosis or edema Pulses: 2+ and symmetric Skin: Skin color, texture, turgor normal. No rashes or lesions Neurologic: Grossly normal Psych: Mood, affect normal  EKG: EKG Interpretation Date/Time:  Friday September 29 2024 09:52:12 EDT Ventricular Rate:  51 PR Interval:  188 QRS Duration:  86 QT Interval:  444 QTC Calculation: 409 R Axis:   29  Text Interpretation: Sinus bradycardia Nonspecific ST abnormality When compared with ECG of 06-Apr-2024 04:25, No significant change since last tracing Confirmed by Daisy Nelson Kent [phone]) on 09/29/2024 10:00:50 AM    ASSESSMENT: Paroxysmal atrial fibrillation - CHADSVASC score of 1 (female >65) on Xarelto  Long-term flecainide  therapy Dyslipidemia- at goal Aortic atherosclerosis  PLAN: 1.   Mrs. Pree continues to do well without any recurrent atrial fibrillation on flecainide .  Her cholesterol is at goal.  She denies any anginal symptoms.  She did have some aortic atherosclerosis but is well treated with regards to her lipids.  She did have a low normal hemoglobin.  This should be monitored periodically with her primary care provider.  Follow-up with me annually or sooner as necessary.  Vinie KYM Maxcy, MD, [facility], FNLA, FACP  [place]  [facility]  Medical Director of the [facility]  [facility] Diplomate of the American Board of Clinical Lipidology Attending Cardiologist  Direct Dial: [phone]  Fax: [phone]  Website:  [email or web address]   Vinie BROCKS Malikye Reppond 09/29/2024, 10:01 AM

## 2024-09-29 NOTE — Patient Instructions (Signed)
 Medication Instructions:  No changes *If you need a refill on your cardiac medications before your next appointment, please call your pharmacy*  Lab Work: None ordered If you have labs (blood work) drawn today and your tests are completely normal, you will receive your results only by: MyChart Message (if you have MyChart) OR A paper copy in the mail If you have any lab test that is abnormal or we need to change your treatment, we will call you to review the results.  Testing/Procedures: None ordered  Follow-Up: At [facility], you and your health needs are our priority.  As part of our continuing mission to provide you with exceptional heart care, our providers are all part of one team.  This team includes your primary Cardiologist (physician) and Advanced Practice Providers or APPs (Physician Assistants and Nurse Practitioners) who all work together to provide you with the care you need, when you need it.  Your next appointment:   1 year(s)  Provider:   Dr Mona or any APP  We recommend signing up for the patient portal called MyChart.  Sign up information is provided on this After Visit Summary.  MyChart is used to connect with patients for Virtual Visits (Telemedicine).  Patients are able to view lab/test results, encounter notes, upcoming appointments, etc.  Non-urgent messages can be sent to your provider as well.   To learn more about what you can do with MyChart, go to [email or web address].

## 2024-09-30 ENCOUNTER — Other Ambulatory Visit: Payer: Self-pay | Admitting: Internal Medicine

## 2024-10-19 ENCOUNTER — Other Ambulatory Visit: Payer: Self-pay

## 2024-10-19 DIAGNOSIS — E1159 Type 2 diabetes mellitus with other circulatory complications: Secondary | ICD-10-CM

## 2024-10-19 DIAGNOSIS — E1169 Type 2 diabetes mellitus with other specified complication: Secondary | ICD-10-CM

## 2024-10-19 DIAGNOSIS — E119 Type 2 diabetes mellitus without complications: Secondary | ICD-10-CM

## 2024-10-19 DIAGNOSIS — E559 Vitamin D deficiency, unspecified: Secondary | ICD-10-CM

## 2024-10-22 ENCOUNTER — Other Ambulatory Visit: Payer: Self-pay | Admitting: Family Medicine

## 2024-10-22 DIAGNOSIS — E119 Type 2 diabetes mellitus without complications: Secondary | ICD-10-CM

## 2024-10-24 ENCOUNTER — Other Ambulatory Visit

## 2024-10-24 DIAGNOSIS — E119 Type 2 diabetes mellitus without complications: Secondary | ICD-10-CM

## 2024-10-24 DIAGNOSIS — E1159 Type 2 diabetes mellitus with other circulatory complications: Secondary | ICD-10-CM

## 2024-10-24 DIAGNOSIS — E559 Vitamin D deficiency, unspecified: Secondary | ICD-10-CM | POA: Diagnosis not present

## 2024-10-24 DIAGNOSIS — E1169 Type 2 diabetes mellitus with other specified complication: Secondary | ICD-10-CM | POA: Diagnosis not present

## 2024-10-24 DIAGNOSIS — I152 Hypertension secondary to endocrine disorders: Secondary | ICD-10-CM | POA: Diagnosis not present

## 2024-10-24 DIAGNOSIS — E785 Hyperlipidemia, unspecified: Secondary | ICD-10-CM | POA: Diagnosis not present

## 2024-10-25 ENCOUNTER — Ambulatory Visit: Payer: Self-pay

## 2024-10-25 LAB — CBC WITH DIFFERENTIAL/PLATELET
Basophils Absolute: 0 x10E3/uL (ref 0.0–0.2)
Basos: 1 %
EOS (ABSOLUTE): 0.1 x10E3/uL (ref 0.0–0.4)
Eos: 2 %
Hematocrit: 36.6 % (ref 34.0–46.6)
Hemoglobin: 11.5 g/dL (ref 11.1–15.9)
Immature Grans (Abs): 0 x10E3/uL (ref 0.0–0.1)
Immature Granulocytes: 0 %
Lymphocytes Absolute: 1 x10E3/uL (ref 0.7–3.1)
Lymphs: 23 %
MCH: 28.1 pg (ref 26.6–33.0)
MCHC: 31.4 g/dL — ABNORMAL LOW (ref 31.5–35.7)
MCV: 90 fL (ref 79–97)
Monocytes Absolute: 0.8 x10E3/uL (ref 0.1–0.9)
Monocytes: 18 %
Neutrophils Absolute: 2.4 x10E3/uL (ref 1.4–7.0)
Neutrophils: 55 %
Platelets: 163 x10E3/uL (ref 150–450)
RBC: 4.09 x10E6/uL (ref 3.77–5.28)
RDW: 13.9 % (ref 11.7–15.4)
WBC: 4.3 x10E3/uL (ref 3.4–10.8)

## 2024-10-25 LAB — COMPREHENSIVE METABOLIC PANEL WITH GFR
ALT: 13 IU/L (ref 0–32)
AST: 19 IU/L (ref 0–40)
Albumin: 4.3 g/dL (ref 3.8–4.8)
Alkaline Phosphatase: 87 IU/L (ref 49–135)
BUN/Creatinine Ratio: 17 (ref 12–28)
BUN: 15 mg/dL (ref 8–27)
Bilirubin Total: 0.3 mg/dL (ref 0.0–1.2)
CO2: 22 mmol/L (ref 20–29)
Calcium: 9.4 mg/dL (ref 8.7–10.3)
Chloride: 105 mmol/L (ref 96–106)
Creatinine, Ser: 0.89 mg/dL (ref 0.57–1.00)
Globulin, Total: 2.1 g/dL (ref 1.5–4.5)
Glucose: 107 mg/dL — ABNORMAL HIGH (ref 70–99)
Potassium: 4.5 mmol/L (ref 3.5–5.2)
Sodium: 140 mmol/L (ref 134–144)
Total Protein: 6.4 g/dL (ref 6.0–8.5)
eGFR: 67 mL/min/1.73 (ref >59)

## 2024-10-25 LAB — HEMOGLOBIN A1C
Est. average glucose Bld gHb Est-mCnc: 134 mg/dL
Hgb A1c MFr Bld: 6.3 % — ABNORMAL HIGH (ref 4.8–5.6)

## 2024-10-25 LAB — LIPID PANEL
Chol/HDL Ratio: 2.1 ratio (ref 0.0–4.4)
Cholesterol, Total: 128 mg/dL (ref 100–199)
HDL: 61 mg/dL (ref >39)
LDL Chol Calc (NIH): 47 mg/dL (ref 0–99)
Triglycerides: 109 mg/dL (ref 0–149)
VLDL Cholesterol Cal: 20 mg/dL (ref 5–40)

## 2024-10-25 LAB — TSH: TSH: 1.39 u[IU]/mL (ref 0.450–4.500)

## 2024-10-25 LAB — VITAMIN D 25 HYDROXY (VIT D DEFICIENCY, FRACTURES): Vit D, 25-Hydroxy: 28.3 ng/mL — ABNORMAL LOW (ref 30.0–100.0)

## 2024-10-31 ENCOUNTER — Ambulatory Visit

## 2024-10-31 VITALS — BP 132/65 | HR 54 | Temp 98.0°F | Ht 65.0 in | Wt 159.0 lb

## 2024-10-31 DIAGNOSIS — I152 Hypertension secondary to endocrine disorders: Secondary | ICD-10-CM

## 2024-10-31 DIAGNOSIS — R61 Generalized hyperhidrosis: Secondary | ICD-10-CM | POA: Insufficient documentation

## 2024-10-31 DIAGNOSIS — E559 Vitamin D deficiency, unspecified: Secondary | ICD-10-CM

## 2024-10-31 DIAGNOSIS — N3281 Overactive bladder: Secondary | ICD-10-CM | POA: Insufficient documentation

## 2024-10-31 DIAGNOSIS — E785 Hyperlipidemia, unspecified: Secondary | ICD-10-CM | POA: Diagnosis not present

## 2024-10-31 DIAGNOSIS — Z7984 Long term (current) use of oral hypoglycemic drugs: Secondary | ICD-10-CM

## 2024-10-31 DIAGNOSIS — E1159 Type 2 diabetes mellitus with other circulatory complications: Secondary | ICD-10-CM

## 2024-10-31 DIAGNOSIS — E1169 Type 2 diabetes mellitus with other specified complication: Secondary | ICD-10-CM | POA: Diagnosis not present

## 2024-10-31 DIAGNOSIS — I480 Paroxysmal atrial fibrillation: Secondary | ICD-10-CM

## 2024-10-31 DIAGNOSIS — E119 Type 2 diabetes mellitus without complications: Secondary | ICD-10-CM

## 2024-10-31 MED ORDER — MIRABEGRON ER 50 MG PO TB24: 50.0000 mg | ORAL_TABLET | Freq: Every day | ORAL | 2 refills | Status: DC

## 2024-10-31 NOTE — Patient Instructions (Signed)
 VISIT SUMMARY: During your visit, we discussed your bladder control issues, night sweats, and reviewed your ongoing management for diabetes, atrial fibrillation, mood disorder, and vitamin D  deficiency. We made some adjustments to your medications and provided new prescriptions to help manage your symptoms.  YOUR PLAN: OVERACTIVE BLADDER: You have been experiencing urgency, frequency, nocturia, and occasional incontinence, possibly due to pelvic floor muscle weakness or other factors. -We obtained a urine sample to check for microscopic hematuria. -You are prescribed Myrbetriq 50 mg ER daily. Start with half a tablet for 1-2 weeks, then increase to a full tablet if needed. -Contact us  if Myrbetriq is not covered by your insurance or is too expensive. -If your symptoms persist or worsen, we may refer you to a urogynecologist.  TYPE 2 DIABETES MELLITUS: Your A1c is slightly increased but still within the target range. -Continue taking metformin  500 mg daily.  CHRONIC ATRIAL FIBRILLATION: Your condition is stable with regular follow-ups with your cardiologist. -Continue your current management and follow up with your cardiologist as scheduled.  HYPERLIPIDEMIA: Your cholesterol levels are well-controlled with your current regimen. -Continue your current cholesterol management.  MAJOR DEPRESSIVE DISORDER: Your night sweats may be related to your sertraline  use. -Reduce your sertraline  dose to 50 mg daily. If your night sweats improve, we may consider tapering off the medication. -We will prescribe 50 mg tablets if you are unable to split your current tablets.  VITAMIN D  DEFICIENCY: Your vitamin D  levels are low, and you have started supplementation. -Continue taking vitamin D  2000 IU daily.  If you have any problems before your next visit feel free to message me via MyChart (minor issues or questions) or call the office, otherwise you may reach out to schedule an office visit.  Thank  you! Saddie Sacks, PA-C

## 2024-10-31 NOTE — Assessment & Plan Note (Signed)
 At goal in office today (<130/80). Continue Metoprolol  25 mg daily. Encouraged ambulatory blood pressure monitoring. Will continue to monitor.

## 2024-10-31 NOTE — Assessment & Plan Note (Signed)
 Stable, followed by cardiology. Continue metoprolol  succinate 25 mg daily, fleccanide 50 mg, and Xarelto  20 mg daily as recommended by cardiology.

## 2024-10-31 NOTE — Assessment & Plan Note (Signed)
 Patient has restarted 2000 unit D3 supplement daily

## 2024-10-31 NOTE — Assessment & Plan Note (Signed)
 Symptoms include urgency, frequency, nocturia, and occasional incontinence. Possible pelvic floor muscle weakness. Differential includes UTI or menopausal changes. - Obtained urine sample for microscopic hematuria. - Prescribed Myrbetriq 50 mg ER daily. Start with half tablet for 1-2 weeks, then increase to full tablet if needed. Would like to avoid oxybutinin due to age if possible.  - Contact provider for alternatives if Myrbetriq is not covered or too expensive. - Consider urogynecologist referral if symptoms persist or worsen.

## 2024-10-31 NOTE — Assessment & Plan Note (Signed)
 Suspect SSRI induced due to Zoloft  100 mg.  - Reduce sertraline  to 50 mg daily. If night sweats improve, consider tapering off.

## 2024-10-31 NOTE — Progress Notes (Signed)
 Established Patient Office Visit  Subjective   Patient ID: Daisy Nelson, female    DOB: [birth year]  Age: 78 y.o. MRN: [id number]  Chief Complaint  Patient presents with   Medical Management of Chronic Issues    HPI   History of Present Illness   Daisy Nelson is a 78 year old female with type 2 diabetes and atrial fibrillation who presents for a routine follow-up and evaluation of bladder issues.  Bladder dysfunction - Bladder control issues characterized by urgency and occasional incontinence, particularly with laughing, coughing, or sneezing - Daily use of pads to manage leakage, with occasional irritation from pad use - Attempts to avoid wearing pads at night, but nocturia persists - Nocturia with at least three episodes per night, occasionally up to five - Hydration maintained with frequent tea consumption and occasional caffeine-free diet soft drinks  Diaphoresis - Night sweats occurring almost every night, sometimes multiple times within a few hours - Episodes result in feeling damp and uncomfortable, requiring removal of clothing to cool down  Type 2 diabetes mellitus - Stable on metformin  500 mg daily - A1c goal less than 7% - No current symptoms of hypoglycemia or hyperglycemia  Atrial fibrillation - Regular follow-up with cardiology - No current medication changes - No symptoms of dizziness, faintness, or shortness of breath  Mood disorder - Stable on sertraline  (Zoloft ) for mood - No reported changes in mood symptoms  Vitamin d  deficiency - Recently started vitamin D  2000 IU daily for low levels          ROS Per HPI.    Objective:     BP 132/65   Pulse (!) 54   Temp 98 F (36.7 C) (Oral)   Ht 5' 5 (1.651 m)   Wt 159 lb 0.6 oz (72.1 kg)   SpO2 97%   BMI 26.47 kg/m    Physical Exam Constitutional:      General: She is not in acute distress.    Appearance: Normal appearance.  Cardiovascular:     Rate and Rhythm: Normal rate  and regular rhythm.     Heart sounds: Normal heart sounds. No murmur heard.    No friction rub. No gallop.  Pulmonary:     Effort: Pulmonary effort is normal. No respiratory distress.     Breath sounds: Normal breath sounds.  Abdominal:     General: Bowel sounds are normal.  Musculoskeletal:        General: No swelling.     Cervical back: Neck supple.  Lymphadenopathy:     Cervical: No cervical adenopathy.  Skin:    General: Skin is warm and dry.  Neurological:     General: No focal deficit present.     Mental Status: She is alert.  Psychiatric:        Mood and Affect: Mood normal.        Behavior: Behavior normal.        Thought Content: Thought content normal.      No results found for any visits on 10/31/24.  Last CBC Lab Results  Component Value Date   WBC 4.3 10/24/2024   HGB 11.5 10/24/2024   HCT 36.6 10/24/2024   MCV 90 10/24/2024   MCH 28.1 10/24/2024   RDW 13.9 10/24/2024   PLT 163 10/24/2024   Last metabolic panel Lab Results  Component Value Date   GLUCOSE 107 (H) 10/24/2024   NA 140 10/24/2024   K 4.5 10/24/2024   CL 105 10/24/2024  CO2 22 10/24/2024   BUN 15 10/24/2024   CREATININE 0.89 10/24/2024   EGFR 67 10/24/2024   CALCIUM  9.4 10/24/2024   PROT 6.4 10/24/2024   ALBUMIN 4.3 10/24/2024   LABGLOB 2.1 10/24/2024   AGRATIO 1.7 07/14/2022   BILITOT 0.3 10/24/2024   ALKPHOS 87 10/24/2024   AST 19 10/24/2024   ALT 13 10/24/2024   ANIONGAP 7 08/25/2016   Last lipids Lab Results  Component Value Date   CHOL 128 10/24/2024   HDL 61 10/24/2024   LDLCALC 47 10/24/2024   TRIG 109 10/24/2024   CHOLHDL 2.1 10/24/2024   Last hemoglobin A1c Lab Results  Component Value Date   HGBA1C 6.3 (H) 10/24/2024   Last thyroid  functions Lab Results  Component Value Date   TSH 1.390 10/24/2024   FREET4 1.04 06/12/2019   Last vitamin D  Lab Results  Component Value Date   VD25OH 28.3 (L) 10/24/2024      The ASCVD Risk score (Arnett DK, et  al., 2019) failed to calculate for the following reasons:   The valid total cholesterol range is 130 to 320 mg/dL    Assessment & Plan:   Overactive bladder Assessment & Plan: Symptoms include urgency, frequency, nocturia, and occasional incontinence. Possible pelvic floor muscle weakness. Differential includes UTI or menopausal changes. - Obtained urine sample for microscopic hematuria. - Prescribed Myrbetriq 50 mg ER daily. Start with half tablet for 1-2 weeks, then increase to full tablet if needed. Would like to avoid oxybutinin due to age if possible.  - Contact provider for alternatives if Myrbetriq is not covered or too expensive. - Consider urogynecologist referral if symptoms persist or worsen.  Orders: -     Mirabegron ER; Take 1 tablet (50 mg total) by mouth daily.  Dispense: 30 tablet; Refill: 2 -     Urinalysis, Routine w reflex microscopic  Vitamin D  deficiency Assessment & Plan: Patient has restarted 2000 unit D3 supplement daily   Type 2 diabetes mellitus without complication, without long-term current use of insulin (HCC) Assessment & Plan: A1c stable and at goal at 6.3 Continue Metformin  500 mg daily. UACR, foot exam, eye exam all UTD.    PAF (paroxysmal atrial fibrillation) (HCC) Assessment & Plan: Stable, followed by cardiology. Continue metoprolol  succinate 25 mg daily, fleccanide 50 mg, and Xarelto  20 mg daily as recommended by cardiology.    Hypertension associated with diabetes [facility]) Assessment & Plan: At goal in office today (<130/80). Continue Metoprolol  25 mg daily. Encouraged ambulatory blood pressure monitoring. Will continue to monitor.    Hyperlipidemia associated with type 2 diabetes mellitus (HCC) Assessment & Plan: Last lipid pane: LDL 47, HDL 61, Trig 109. LDL at goal. Continue rosuvastatin  20 mg daily. Will cont to monitor.    Night sweats Assessment & Plan: Suspect SSRI induced due to Zoloft  100 mg.  - Reduce sertraline  to 50 mg  daily. If night sweats improve, consider tapering off.     Return in about 6 weeks (around 12/12/2024) for Mood/night sweats, OAB.    Saddie JULIANNA Sacks, PA-C

## 2024-10-31 NOTE — Assessment & Plan Note (Signed)
 Last lipid pane: LDL 47, HDL 61, Trig 109. LDL at goal. Continue rosuvastatin  20 mg daily. Will cont to monitor.

## 2024-10-31 NOTE — Assessment & Plan Note (Signed)
 A1c stable and at goal at 6.3 Continue Metformin  500 mg daily. UACR, foot exam, eye exam all UTD.

## 2024-11-01 ENCOUNTER — Ambulatory Visit: Payer: Self-pay

## 2024-11-01 LAB — URINALYSIS, ROUTINE W REFLEX MICROSCOPIC
Bilirubin, UA: NEGATIVE
Glucose, UA: NEGATIVE
Ketones, UA: NEGATIVE
Nitrite, UA: POSITIVE — AB
Protein,UA: NEGATIVE
RBC, UA: NEGATIVE
Specific Gravity, UA: 1.021 (ref 1.005–1.030)
Urobilinogen, Ur: 0.2 mg/dL (ref 0.2–1.0)
pH, UA: 5.5 (ref 5.0–7.5)

## 2024-11-01 LAB — MICROSCOPIC EXAMINATION: Casts: NONE SEEN /LPF

## 2024-11-01 MED ORDER — CEPHALEXIN 500 MG PO CAPS: 500.0000 mg | ORAL_CAPSULE | Freq: Three times a day (TID) | ORAL | 0 refills | Status: AC

## 2024-11-10 ENCOUNTER — Other Ambulatory Visit: Payer: Self-pay

## 2024-11-10 DIAGNOSIS — E782 Mixed hyperlipidemia: Secondary | ICD-10-CM

## 2024-11-16 DIAGNOSIS — R300 Dysuria: Secondary | ICD-10-CM

## 2024-11-16 MED ORDER — SULFAMETHOXAZOLE-TRIMETHOPRIM 800-160 MG PO TABS: 1.0000 | ORAL_TABLET | Freq: Two times a day (BID) | ORAL | 0 refills | Status: AC

## 2024-11-16 NOTE — Telephone Encounter (Signed)
 I will go ahead and send in antibiotic, but can she come in tomorrow morning for a urine sample so that we can culture it? Thank you!

## 2024-11-17 ENCOUNTER — Ambulatory Visit (INDEPENDENT_AMBULATORY_CARE_PROVIDER_SITE_OTHER)

## 2024-11-17 DIAGNOSIS — R300 Dysuria: Secondary | ICD-10-CM

## 2024-11-17 NOTE — Progress Notes (Signed)
 Pt came in to office to give a urine for culture per Saddie Sacks.  Urine processed and sent out for culture.

## 2024-11-19 ENCOUNTER — Emergency Department (HOSPITAL_COMMUNITY)
Admission: EM | Admit: 2024-11-19 | Discharge: 2024-11-19 | Disposition: A | Discharge status: Home / Self Care | Attending: Emergency Medicine | Admitting: Emergency Medicine

## 2024-11-19 ENCOUNTER — Emergency Department (HOSPITAL_COMMUNITY)

## 2024-11-19 ENCOUNTER — Encounter (HOSPITAL_COMMUNITY): Payer: Self-pay

## 2024-11-19 ENCOUNTER — Other Ambulatory Visit: Payer: Self-pay

## 2024-11-19 DIAGNOSIS — I4891 Unspecified atrial fibrillation: Secondary | ICD-10-CM | POA: Insufficient documentation

## 2024-11-19 DIAGNOSIS — Z7901 Long term (current) use of anticoagulants: Secondary | ICD-10-CM | POA: Insufficient documentation

## 2024-11-19 DIAGNOSIS — M79622 Pain in left upper arm: Secondary | ICD-10-CM | POA: Insufficient documentation

## 2024-11-19 DIAGNOSIS — M79602 Pain in left arm: Secondary | ICD-10-CM

## 2024-11-19 LAB — BASIC METABOLIC PANEL WITH GFR
Anion gap: 12 (ref 5–15)
BUN: 14 mg/dL (ref 8–23)
CO2: 20 mmol/L — ABNORMAL LOW (ref 22–32)
Calcium: 9.2 mg/dL (ref 8.9–10.3)
Chloride: 103 mmol/L (ref 98–111)
Creatinine, Ser: 0.95 mg/dL (ref 0.44–1.00)
GFR, Estimated: >60 mL/min
Glucose, Bld: 125 mg/dL — ABNORMAL HIGH (ref 70–99)
Potassium: 4.6 mmol/L (ref 3.5–5.1)
Sodium: 135 mmol/L (ref 135–145)

## 2024-11-19 LAB — CBC
HCT: 36.1 % (ref 36.0–46.0)
Hemoglobin: 11.6 g/dL — ABNORMAL LOW (ref 12.0–15.0)
MCH: 28.2 pg (ref 26.0–34.0)
MCHC: 32.1 g/dL (ref 30.0–36.0)
MCV: 87.8 fL (ref 80.0–100.0)
Platelets: 149 K/uL — ABNORMAL LOW (ref 150–400)
RBC: 4.11 MIL/uL (ref 3.87–5.11)
RDW: 13.7 % (ref 11.5–15.5)
WBC: 4.2 K/uL (ref 4.0–10.5)
nRBC: 0 % (ref 0.0–0.2)

## 2024-11-19 LAB — TROPONIN T, HIGH SENSITIVITY
Troponin T High Sensitivity: <15 ng/L (ref 0–19)
Troponin T High Sensitivity: <15 ng/L (ref 0–19)

## 2024-11-19 NOTE — Discharge Instructions (Signed)
 Test today did not show any signs of heart attack.  .  Take over-the-counter medication such as Tylenol  to help with pain.  Follow-up with your doctor next week to be rechecked.  Return to the ER for fevers chills redness swelling or other concerning symptoms

## 2024-11-19 NOTE — ED Triage Notes (Signed)
 Pt arrives via EMS from home. Pt reports she was getting back in her bed around 0300 this morning when she had a sudden onset of pain in her left arm. Denies cp, no injury. Pain is still present and now radiating down her arm. Reports she takes xarelto  for afib.

## 2024-11-19 NOTE — ED Provider Notes (Signed)
 " [facility] AT [facility] Provider Note   CSN: [id number] Arrival date & time: 11/19/24  9078     Patient presents with: Arm Pain   Daisy Nelson is a 78 y.o. female.    Arm Pain     Patient presents to the ED with complaints of arm pain.  Patient does have history of acid reflux hyperlipidemia atrial fibrillation.  Patient states this morning at about 3 AM she had sudden onset of pain in her left arm.  It is primarily in her left upper arm.  Patient denies any chest pain.  She denies any falls.  She is not have any shortness of breath.  She does take Xarelto  for A-fib.  Prior to Admission medications  Medication Sig Start Date End Date Taking? Authorizing Provider  acetaminophen  (TYLENOL ) 650 MG CR tablet Take 650 mg by mouth every 8 (eight) hours as needed for pain. For arthritis of hands.    [provider]  cephALEXin  (KEFLEX ) 500 MG capsule Take 1 capsule (500 mg total) by mouth 3 (three) times daily. 11/01/24   Gayle Saddie FALCON, PA-C  cimetidine (TAGAMET) 200 MG tablet Take 200 mg by mouth every evening.    [provider]  flecainide  (TAMBOCOR ) 50 MG tablet Take 1 tablet (50 mg total) by mouth 2 (two) times daily. 09/26/24   Mona Vinie BROCKS, MD  fluticasone  (FLONASE ) 50 MCG/ACT nasal spray Use 2 spray(s) in each nostril once daily 07/24/24   Gayle Saddie F, PA-C  glucose blood test strip Use as instructed 05/04/22   Abonza, Maritza, PA-C  Loratadine 10 MG CAPS     [provider]  meclizine  (ANTIVERT ) 25 MG tablet Take 1 tablet (25 mg total) by mouth 2 (two) times daily as needed for dizziness. 04/06/24   Midge Golas, MD  metFORMIN  (GLUCOPHAGE ) 500 MG tablet Take 1 tablet by mouth once daily with breakfast 10/23/24   Gayle Saddie F, PA-C  metoprolol  succinate (TOPROL -XL) 25 MG 24 hr tablet Take 1 tablet by mouth once daily 10/02/24   Hilty, Vinie BROCKS, MD  mirabegron  ER (MYRBETRIQ ) 50 MG TB24 tablet Take 1 tablet (50 mg  total) by mouth daily. 10/31/24   Gayle Saddie FALCON, PA-C  rivaroxaban  (XARELTO ) 20 MG TABS tablet TAKE 1 TABLET BY MOUTH ONCE DAILY WITH SUPPER 09/27/24   Hilty, Vinie BROCKS, MD  rosuvastatin  (CRESTOR ) 20 MG tablet Take 1 tablet by mouth once daily 11/10/24   Clapp, Kara F, PA-C  sertraline  (ZOLOFT ) 100 MG tablet Take 1 tablet by mouth once daily 09/22/24   Clapp, Kara F, PA-C  sulfamethoxazole -trimethoprim  (BACTRIM  DS) 800-160 MG tablet Take 1 tablet by mouth 2 (two) times daily for 7 days. 11/16/24 11/23/24  Gayle Saddie FALCON, PA-C    Allergies: Adhesive [tape], Erythromycin, Naproxen, and Penicillins    Review of Systems  Updated Vital Signs BP (!) 127/53 (BP Location: Right Arm)   Pulse 63   Temp 98.4 F (36.9 C) (Oral)   Resp 16   SpO2 100%   Physical Exam Vitals and nursing note reviewed.  Constitutional:      General: She is not in acute distress.    Appearance: She is well-developed.  HENT:     Head: Normocephalic and atraumatic.     Right Ear: External ear normal.     Left Ear: External ear normal.  Eyes:     General: No scleral icterus.       Right eye: No discharge.  Left eye: No discharge.     Conjunctiva/sclera: Conjunctivae normal.  Neck:     Trachea: No tracheal deviation.  Cardiovascular:     Rate and Rhythm: Normal rate and regular rhythm.  Pulmonary:     Effort: Pulmonary effort is normal. No respiratory distress.     Breath sounds: Normal breath sounds. No stridor.  Abdominal:     General: There is no distension.  Musculoskeletal:        General: Tenderness present. No swelling or deformity.     Cervical back: Neck supple.     Comments: Tenderness palpation in the left posterior medial aspect of her upper arm, there is a focal area of tenderness in the tricep region, no erythema or induration, no edema, strong radial pulse, sensation intact  Skin:    General: Skin is warm and dry.     Findings: No rash.  Neurological:     Mental Status: She is alert.  Mental status is at baseline.     Cranial Nerves: No dysarthria or facial asymmetry.     Motor: No seizure activity.     (all labs ordered are listed, but only abnormal results are displayed) Labs Reviewed  BASIC METABOLIC PANEL WITH GFR - Abnormal; Notable for the following components:      Result Value   CO2 20 (*)    Glucose, Bld 125 (*)    All other components within normal limits  CBC - Abnormal; Notable for the following components:   Hemoglobin 11.6 (*)    Platelets 149 (*)    All other components within normal limits  TROPONIN T, HIGH SENSITIVITY  TROPONIN T, HIGH SENSITIVITY    EKG: EKG Interpretation Date/Time:  Sunday November 19 2024 09:43:34 EST Ventricular Rate:  63 PR Interval:  172 QRS Duration:  82 QT Interval:  400 QTC Calculation: 409 R Axis:   63  Text Interpretation: Normal sinus rhythm Nonspecific ST abnormality Abnormal ECG When compared with ECG of 29-Sep-2024 09:52, No significant change since last tracing Confirmed by Randol Simmonds [phone]) on 11/19/2024 11:51:02 AM  Radiology: DG Chest 2 View Result Date: 11/19/2024 EXAM: 2 VIEW(S) XRAY OF THE CHEST 11/19/2024 10:15:00 AM COMPARISON: 07/18/2016 CLINICAL HISTORY: left arm pain FINDINGS: LUNGS AND PLEURA: No focal pulmonary opacity. No pleural effusion. No pneumothorax. HEART AND MEDIASTINUM: Aortic atherosclerosis. No acute abnormality of the cardiac and mediastinal silhouettes. BONES AND SOFT TISSUES: No acute osseous abnormality. IMPRESSION: 1. No acute process. Electronically signed by: Waddell Calk MD 11/19/2024 10:31 AM EST RP Workstation: [id number]     Procedures   Medications Ordered in the ED - No data to display  Clinical Course as of 11/19/24 1402  Sun Nov 19, 2024  1150 CBC and metabolic panel normal.  Initial troponin normal [JK]  1150 Chest x-ray without acute process [JK]  1345 Serial troponins normal [JK]    Clinical Course User Index [JK] Randol Simmonds, MD                                  Medical Decision Making Problems Addressed: Left arm pain: acute illness or injury that poses a threat to life or bodily functions  Amount and/or Complexity of Data Reviewed Labs: ordered. Decision-making details documented in ED Course. Radiology: ordered and independent interpretation performed.   Patient presents with complaints of acute left arm pain.  Patient was concerned about referred pain from a cardiac etiology.  Her  ED workup is reassuring.  There is no evidence of pneumonia or pneumothorax on chest x-ray.  Her EKG does not show any acute changes.  Her serial troponins are normal.  I have low suspicion for cardiac etiology with her normal ED workup and focal tenderness in the left arm.  Patient does not have any signs of infection.  Low suspicion for DVT as there is no erythema or edema and the patient is already on anticoagulation.  Unclear etiology but appears more muscular in nature.  Will have her try taking over-the-counter Tylenol  for pain.  Outpatient follow-up PCP to be rechecked.     Final diagnoses:  Left arm pain    ED Discharge Orders     None          Randol Simmonds, MD 11/19/24 1402  "

## 2024-11-21 ENCOUNTER — Ambulatory Visit: Payer: Self-pay

## 2024-11-21 LAB — URINE CULTURE

## 2024-12-12 ENCOUNTER — Ambulatory Visit

## 2024-12-12 VITALS — BP 124/63 | HR 53 | Temp 98.3°F | Ht 65.0 in | Wt 158.0 lb

## 2024-12-12 DIAGNOSIS — I152 Hypertension secondary to endocrine disorders: Secondary | ICD-10-CM

## 2024-12-12 DIAGNOSIS — N3281 Overactive bladder: Secondary | ICD-10-CM

## 2024-12-12 DIAGNOSIS — R61 Generalized hyperhidrosis: Secondary | ICD-10-CM

## 2024-12-12 DIAGNOSIS — I480 Paroxysmal atrial fibrillation: Secondary | ICD-10-CM | POA: Diagnosis not present

## 2024-12-12 DIAGNOSIS — M79602 Pain in left arm: Secondary | ICD-10-CM | POA: Insufficient documentation

## 2024-12-12 DIAGNOSIS — Z7984 Long term (current) use of oral hypoglycemic drugs: Secondary | ICD-10-CM | POA: Diagnosis not present

## 2024-12-12 DIAGNOSIS — F39 Unspecified mood [affective] disorder: Secondary | ICD-10-CM | POA: Diagnosis not present

## 2024-12-12 DIAGNOSIS — N1831 Chronic kidney disease, stage 3a: Secondary | ICD-10-CM

## 2024-12-12 DIAGNOSIS — E1159 Type 2 diabetes mellitus with other circulatory complications: Secondary | ICD-10-CM | POA: Diagnosis not present

## 2024-12-12 MED ORDER — MIRABEGRON ER 50 MG PO TB24: 50.0000 mg | ORAL_TABLET | Freq: Every day | ORAL | 2 refills | Status: DC

## 2024-12-12 NOTE — Assessment & Plan Note (Signed)
 At goal in office today (<130/80). Continue Metoprolol  25 mg daily. Encouraged ambulatory blood pressure monitoring. Will continue to monitor.

## 2024-12-12 NOTE — Assessment & Plan Note (Signed)
 Still happening, but occurring much less frequently (no longer every night) and are lessened in severity. Given improvements after decreasing Zoloft  dose, suspect night sweats were SSRI induced. Continue Zoloft  at 50 mg. Advised that she can decrease to 25 mg for a few weeks before discontinuing if she would like. Will cont to monitor.

## 2024-12-12 NOTE — Assessment & Plan Note (Signed)
 Stable, followed by cardiology. Continue metoprolol  succinate 25 mg daily, fleccanide 50 mg, and Xarelto  20 mg daily as recommended by cardiology.

## 2024-12-12 NOTE — Patient Instructions (Signed)
 VISIT SUMMARY: During your visit, we discussed the management of your medications and several health concerns. Your urinary incontinence has improved with Myrbetriq , and your night sweats have decreased after adjusting your Zoloft  dosage. We also reviewed your recent urinary tract infection, left arm pain, and mild anemia.  YOUR PLAN: OVERACTIVE BLADDER: Your symptoms have improved with Myrbetriq , and you have no incontinence or nocturia. -Continue taking Myrbetriq  at the current dose. -Your Myrbetriq  prescription has been refilled for 90 days. -Monitor for any recurrence of symptoms and consider a dose adjustment if symptoms return in six months.  ANEMIA: You have mild anemia with a slightly low hemoglobin level, but your red blood cell count is normal. -We will recheck your hemoglobin in a couple of months. -Consider taking iron supplements if your hemoglobin remains low.  GENERAL HEALTH MAINTENANCE: Your blood pressure is controlled, and recent labs show normal kidney and liver function with stable blood counts. -We have scheduled a follow-up appointment for early May. -Please get your labs checked a week before your follow-up appointment.  If you have any problems before your next visit feel free to message me via MyChart (minor issues or questions) or call the office, otherwise you may reach out to schedule an office visit.  Thank you! Saddie Sacks, PA-C

## 2024-12-12 NOTE — Assessment & Plan Note (Signed)
 Seen in the ED for this on 11/19/25. Workup negative. Diagnosis thought to be MSK in nature -- pain has not recurred. Discussed likely etiology of radiculopathy versus nerve irritation since it happened while laying in bed. Advised to monitor symptoms and notify if they recur.

## 2024-12-12 NOTE — Progress Notes (Signed)
 "  Established Patient Office Visit  Subjective   Patient ID: Daisy Nelson, female    DOB: 15-Nov-1946  Age: 79 y.o. MRN: [id number]  Chief Complaint  Patient presents with   Medication Management    HPI   History of Present Illness   Daisy Nelson is a 79 year old female who presents for follow-up on medication management.  Urinary incontinence - Significant improvement in symptoms with current dose of Myrbetriq  - No involuntary urine leakage upon standing - Reduced nocturia - Denies side effects; happy at the current dose   Night sweats - Decreased frequency and severity after reducing Zoloft  dosage from 100 mg to 50 mg  - Currently taking 50 mg of Zoloft  - Mood and anxiety and stable and well controlled at the lower dose. She is considering tapering from 50 mg to 25 mg before deciding if she wants to discontinue the medication.   Urinary tract infection - Recent UTI treated with antibiotics - No current sx of UTI - Feeling much better after treatment  Left arm pain - Left arm pain radiating down the arm occurred on December 21st - Prompted emergency room visit due to cardiac history - Full workup performed - MI and other cardiac problems ruled out. Diagnosis was found to likely be MSK in nature.  - Pain has not recurred since  Anemia - Mild anemia with slightly low hemoglobin level found during recent hospital visit - No dizziness or fatigue         ROS Per HPI.    Objective:     BP 124/63   Pulse (!) 53   Temp 98.3 F (36.8 C) (Oral)   Ht 5' 5 (1.651 m)   Wt 158 lb 0.6 oz (71.7 kg)   SpO2 95%   BMI 26.30 kg/m    Physical Exam Constitutional:      General: She is not in acute distress.    Appearance: Normal appearance.  Cardiovascular:     Rate and Rhythm: Normal rate and regular rhythm.     Heart sounds: Normal heart sounds. No murmur heard.    No friction rub. No gallop.  Pulmonary:     Effort: Pulmonary effort is normal. No  respiratory distress.     Breath sounds: Normal breath sounds.  Musculoskeletal:        General: No swelling.  Skin:    General: Skin is warm and dry.  Neurological:     General: No focal deficit present.     Mental Status: She is alert.  Psychiatric:        Mood and Affect: Mood normal.        Behavior: Behavior normal.        Thought Content: Thought content normal.      No results found for any visits on 12/12/24.  Last CBC Lab Results  Component Value Date   WBC 4.2 11/19/2024   HGB 11.6 (L) 11/19/2024   HCT 36.1 11/19/2024   MCV 87.8 11/19/2024   MCH 28.2 11/19/2024   RDW 13.7 11/19/2024   PLT 149 (L) 11/19/2024   Last metabolic panel Lab Results  Component Value Date   GLUCOSE 125 (H) 11/19/2024   NA 135 11/19/2024   K 4.6 11/19/2024   CL 103 11/19/2024   CO2 20 (L) 11/19/2024   BUN 14 11/19/2024   CREATININE 0.95 11/19/2024   GFRNONAA >60 11/19/2024   CALCIUM  9.2 11/19/2024   PROT 6.4 10/24/2024   ALBUMIN 4.3 10/24/2024  LABGLOB 2.1 10/24/2024   AGRATIO 1.7 07/14/2022   BILITOT 0.3 10/24/2024   ALKPHOS 87 10/24/2024   AST 19 10/24/2024   ALT 13 10/24/2024   ANIONGAP 12 11/19/2024   Last lipids Lab Results  Component Value Date   CHOL 128 10/24/2024   HDL 61 10/24/2024   LDLCALC 47 10/24/2024   TRIG 109 10/24/2024   CHOLHDL 2.1 10/24/2024   Last hemoglobin A1c Lab Results  Component Value Date   HGBA1C 6.3 (H) 10/24/2024   Last thyroid  functions Lab Results  Component Value Date   TSH 1.390 10/24/2024   FREET4 1.04 06/12/2019   Last vitamin D  Lab Results  Component Value Date   VD25OH 28.3 (L) 10/24/2024      The ASCVD Risk score (Arnett DK, et al., 2019) failed to calculate for the following reasons:   The valid total cholesterol range is 130 to 320 mg/dL    Assessment & Plan:   Mood disorder Assessment & Plan: Mood symptoms stable and unchanged with decreased dose of Zoloft . Currently doing well at 50 mg daily. She is  considering decreasing to 25 mg before deciding on discontinuing. Will notify if mood symptoms worsen and we can do a trial of different SSRI/SNRI    Overactive bladder Assessment & Plan: Symptoms improved with Myrbetriq , no incontinence, reduced nocturia. Full effects may take weeks. - Continue Myrbetriq  at current dose. 50 mg ER daily. - Refilled Myrbetriq  for 90 days. - Monitor for symptom recurrence, consider dose adjustment if symptoms return in six months.  Orders: -     Mirabegron  ER; Take 1 tablet (50 mg total) by mouth daily.  Dispense: 90 tablet; Refill: 2  Night sweats Assessment & Plan: Still happening, but occurring much less frequently (no longer every night) and are lessened in severity. Given improvements after decreasing Zoloft  dose, suspect night sweats were SSRI induced. Continue Zoloft  at 50 mg. Advised that she can decrease to 25 mg for a few weeks before discontinuing if she would like. Will cont to monitor.   Hypertension associated with diabetes [facility]) Assessment & Plan: At goal in office today (<130/80). Continue Metoprolol  25 mg daily. Encouraged ambulatory blood pressure monitoring. Will continue to monitor.    PAF (paroxysmal atrial fibrillation) (HCC) Assessment & Plan: Stable, followed by cardiology. Continue metoprolol  succinate 25 mg daily, fleccanide 50 mg, and Xarelto  20 mg daily as recommended by cardiology.    Stage 3a chronic kidney disease (HCC) Assessment & Plan: Stable kidney function. GFR 58. Continue to increase daily fluid intake and avoid nephro-toxic medications (NSAIDs). Will continue to monitor.    Left arm pain Assessment & Plan: Seen in the ED for this on 11/19/25. Workup negative. Diagnosis thought to be MSK in nature -- pain has not recurred. Discussed likely etiology of radiculopathy versus nerve irritation since it happened while laying in bed. Advised to monitor symptoms and notify if they recur.      Return in about 4  months (around 04/11/2025) for DM, HLD, OAB, mood/night sweats .    Daisy JULIANNA Sacks, PA-C "

## 2024-12-12 NOTE — Assessment & Plan Note (Signed)
 Stable kidney function. GFR 58. Continue to increase daily fluid intake and avoid nephro-toxic medications (NSAIDs). Will continue to monitor.

## 2024-12-12 NOTE — Assessment & Plan Note (Signed)
 Mood symptoms stable and unchanged with decreased dose of Zoloft . Currently doing well at 50 mg daily. She is considering decreasing to 25 mg before deciding on discontinuing. Will notify if mood symptoms worsen and we can do a trial of different SSRI/SNRI

## 2024-12-12 NOTE — Assessment & Plan Note (Signed)
 Symptoms improved with Myrbetriq , no incontinence, reduced nocturia. Full effects may take weeks. - Continue Myrbetriq  at current dose. 50 mg ER daily. - Refilled Myrbetriq  for 90 days. - Monitor for symptom recurrence, consider dose adjustment if symptoms return in six months.

## 2024-12-22 ENCOUNTER — Other Ambulatory Visit: Payer: Self-pay | Admitting: Internal Medicine

## 2024-12-22 DIAGNOSIS — N3281 Overactive bladder: Secondary | ICD-10-CM

## 2024-12-26 ENCOUNTER — Encounter: Payer: Self-pay | Admitting: Internal Medicine

## 2024-12-26 MED ORDER — OXYBUTYNIN CHLORIDE ER 5 MG PO TB24: 5.0000 mg | ORAL_TABLET | Freq: Every day | ORAL | 2 refills | Status: AC

## 2024-12-26 MED ORDER — FLECAINIDE ACETATE 50 MG PO TABS: 50.0000 mg | ORAL_TABLET | Freq: Two times a day (BID) | ORAL | 3 refills | Status: AC

## 2025-04-04 ENCOUNTER — Other Ambulatory Visit

## 2025-04-11 ENCOUNTER — Ambulatory Visit

## 2025-09-27 ENCOUNTER — Ambulatory Visit
